# Patient Record
Sex: Male | Born: 1935 | Race: White | Hispanic: No | Smoking: Never smoker
Health system: Southern US, Community
[De-identification: ages and names within clinical notes are randomized; demographics above are authoritative.]

## PROBLEM LIST (undated history)

## (undated) DIAGNOSIS — I4821 Permanent atrial fibrillation: Secondary | ICD-10-CM

## (undated) DIAGNOSIS — I2510 Atherosclerotic heart disease of native coronary artery without angina pectoris: Secondary | ICD-10-CM

## (undated) DIAGNOSIS — I4510 Unspecified right bundle-branch block: Secondary | ICD-10-CM

## (undated) DIAGNOSIS — M4800 Spinal stenosis, site unspecified: Secondary | ICD-10-CM

## (undated) DIAGNOSIS — E785 Hyperlipidemia, unspecified: Secondary | ICD-10-CM

## (undated) DIAGNOSIS — Z5189 Encounter for other specified aftercare: Secondary | ICD-10-CM

## (undated) DIAGNOSIS — G629 Polyneuropathy, unspecified: Secondary | ICD-10-CM

## (undated) DIAGNOSIS — I10 Essential (primary) hypertension: Secondary | ICD-10-CM

## (undated) DIAGNOSIS — H409 Unspecified glaucoma: Secondary | ICD-10-CM

## (undated) DIAGNOSIS — K5790 Diverticulosis of intestine, part unspecified, without perforation or abscess without bleeding: Secondary | ICD-10-CM

## (undated) DIAGNOSIS — M1990 Unspecified osteoarthritis, unspecified site: Secondary | ICD-10-CM

## (undated) DIAGNOSIS — M7050 Other bursitis of knee, unspecified knee: Secondary | ICD-10-CM

## (undated) DIAGNOSIS — D126 Benign neoplasm of colon, unspecified: Secondary | ICD-10-CM

## (undated) DIAGNOSIS — Q249 Congenital malformation of heart, unspecified: Secondary | ICD-10-CM

## (undated) HISTORY — PX: EYE SURGERY: SHX253

## (undated) HISTORY — DX: Benign neoplasm of colon, unspecified: D12.6

## (undated) HISTORY — DX: Congenital malformation of heart, unspecified: Q24.9

## (undated) HISTORY — DX: Hyperlipidemia, unspecified: E78.5

## (undated) HISTORY — PX: ROTATOR CUFF REPAIR: SHX139

## (undated) HISTORY — DX: Polyneuropathy, unspecified: G62.9

## (undated) HISTORY — PX: SPINE SURGERY: SHX786

## (undated) HISTORY — DX: Encounter for other specified aftercare: Z51.89

## (undated) HISTORY — DX: Other bursitis of knee, unspecified knee: M70.50

## (undated) HISTORY — DX: Essential (primary) hypertension: I10

## (undated) HISTORY — PX: VASECTOMY: SHX75

## (undated) HISTORY — PX: CORONARY ANGIOPLASTY WITH STENT PLACEMENT: SHX49

## (undated) HISTORY — PX: SMALL INTESTINE SURGERY: SHX150

## (undated) HISTORY — PX: CORNEAL TRANSPLANT: SHX108

## (undated) HISTORY — DX: Unspecified glaucoma: H40.9

## (undated) HISTORY — PX: BACK SURGERY: SHX140

## (undated) HISTORY — DX: Atherosclerotic heart disease of native coronary artery without angina pectoris: I25.10

## (undated) HISTORY — DX: Permanent atrial fibrillation: I48.21

## (undated) HISTORY — DX: Unspecified right bundle-branch block: I45.10

## (undated) HISTORY — DX: Diverticulosis of intestine, part unspecified, without perforation or abscess without bleeding: K57.90

## (undated) HISTORY — DX: Spinal stenosis, site unspecified: M48.00

## (undated) HISTORY — DX: Unspecified osteoarthritis, unspecified site: M19.90

---

## 1998-04-28 ENCOUNTER — Ambulatory Visit (HOSPITAL_COMMUNITY): Admission: RE | Admit: 1998-04-28 | Discharge: 1998-04-28 | Payer: Self-pay | Admitting: Gastroenterology

## 1999-05-22 ENCOUNTER — Ambulatory Visit (HOSPITAL_COMMUNITY): Admission: RE | Admit: 1999-05-22 | Discharge: 1999-05-22 | Payer: Self-pay | Admitting: Cardiology

## 1999-05-22 ENCOUNTER — Encounter: Payer: Self-pay | Admitting: Cardiology

## 1999-07-09 ENCOUNTER — Encounter: Payer: Self-pay | Admitting: Cardiology

## 1999-07-09 ENCOUNTER — Ambulatory Visit (HOSPITAL_COMMUNITY): Admission: RE | Admit: 1999-07-09 | Discharge: 1999-07-09 | Payer: Self-pay | Admitting: Cardiology

## 1999-11-06 ENCOUNTER — Encounter (INDEPENDENT_AMBULATORY_CARE_PROVIDER_SITE_OTHER): Payer: Self-pay | Admitting: Specialist

## 1999-11-06 ENCOUNTER — Ambulatory Visit (HOSPITAL_COMMUNITY): Admission: RE | Admit: 1999-11-06 | Discharge: 1999-11-06 | Payer: Self-pay | Admitting: Gastroenterology

## 2005-01-20 ENCOUNTER — Encounter: Admission: RE | Admit: 2005-01-20 | Discharge: 2005-01-20 | Payer: Self-pay | Admitting: Family Medicine

## 2005-01-20 DIAGNOSIS — M4800 Spinal stenosis, site unspecified: Secondary | ICD-10-CM

## 2005-01-20 HISTORY — DX: Spinal stenosis, site unspecified: M48.00

## 2005-01-22 ENCOUNTER — Emergency Department (HOSPITAL_COMMUNITY): Admission: EM | Admit: 2005-01-22 | Discharge: 2005-01-22 | Payer: Self-pay | Admitting: Emergency Medicine

## 2005-02-05 ENCOUNTER — Ambulatory Visit (HOSPITAL_COMMUNITY): Admission: RE | Admit: 2005-02-05 | Discharge: 2005-02-05 | Payer: Self-pay | Admitting: Neurosurgery

## 2005-03-01 ENCOUNTER — Ambulatory Visit (HOSPITAL_COMMUNITY): Admission: RE | Admit: 2005-03-01 | Discharge: 2005-03-02 | Payer: Self-pay | Admitting: Cardiology

## 2005-04-15 ENCOUNTER — Ambulatory Visit (HOSPITAL_COMMUNITY): Admission: RE | Admit: 2005-04-15 | Discharge: 2005-04-16 | Payer: Self-pay | Admitting: Neurosurgery

## 2005-04-25 ENCOUNTER — Inpatient Hospital Stay (HOSPITAL_COMMUNITY): Admission: EM | Admit: 2005-04-25 | Discharge: 2005-05-03 | Payer: Self-pay | Admitting: Emergency Medicine

## 2005-05-13 ENCOUNTER — Ambulatory Visit (HOSPITAL_COMMUNITY): Admission: RE | Admit: 2005-05-13 | Discharge: 2005-05-13 | Payer: Self-pay | Admitting: Neurosurgery

## 2005-06-02 ENCOUNTER — Encounter: Admission: RE | Admit: 2005-06-02 | Discharge: 2005-06-02 | Payer: Self-pay | Admitting: Family Medicine

## 2005-12-09 DIAGNOSIS — M1990 Unspecified osteoarthritis, unspecified site: Secondary | ICD-10-CM

## 2005-12-09 HISTORY — DX: Unspecified osteoarthritis, unspecified site: M19.90

## 2006-01-03 ENCOUNTER — Encounter: Admission: RE | Admit: 2006-01-03 | Discharge: 2006-01-03 | Payer: Self-pay | Admitting: Family Medicine

## 2006-02-25 ENCOUNTER — Emergency Department (HOSPITAL_COMMUNITY): Admission: EM | Admit: 2006-02-25 | Discharge: 2006-02-25 | Payer: Self-pay | Admitting: Emergency Medicine

## 2006-09-19 ENCOUNTER — Ambulatory Visit: Payer: Self-pay | Admitting: Family Medicine

## 2006-09-19 LAB — CONVERTED CEMR LAB
Creatinine,U: 100 mg/dL
Hgb A1c MFr Bld: 6 % (ref 4.6–6.0)
Microalb, Ur: 0.2 mg/dL (ref 0.0–1.9)

## 2006-10-28 ENCOUNTER — Ambulatory Visit: Payer: Self-pay | Admitting: Internal Medicine

## 2006-12-19 ENCOUNTER — Ambulatory Visit: Payer: Self-pay | Admitting: Family Medicine

## 2006-12-19 LAB — CONVERTED CEMR LAB
Creatinine,U: 78.4 mg/dL
Glucose, Bld: 131 mg/dL — ABNORMAL HIGH (ref 70–99)
Hgb A1c MFr Bld: 6.1 % — ABNORMAL HIGH (ref 4.6–6.0)
Microalb Creat Ratio: 3.8 mg/g (ref 0.0–30.0)
Microalb, Ur: 0.3 mg/dL (ref 0.0–1.9)

## 2007-03-27 DIAGNOSIS — I10 Essential (primary) hypertension: Secondary | ICD-10-CM | POA: Insufficient documentation

## 2007-03-27 DIAGNOSIS — I2510 Atherosclerotic heart disease of native coronary artery without angina pectoris: Secondary | ICD-10-CM | POA: Insufficient documentation

## 2007-03-27 HISTORY — DX: Atherosclerotic heart disease of native coronary artery without angina pectoris: I25.10

## 2007-04-23 ENCOUNTER — Telehealth (INDEPENDENT_AMBULATORY_CARE_PROVIDER_SITE_OTHER): Payer: Self-pay | Admitting: Family Medicine

## 2007-05-12 ENCOUNTER — Ambulatory Visit: Payer: Self-pay | Admitting: Family Medicine

## 2007-05-12 DIAGNOSIS — E119 Type 2 diabetes mellitus without complications: Secondary | ICD-10-CM | POA: Insufficient documentation

## 2007-05-12 DIAGNOSIS — D229 Melanocytic nevi, unspecified: Secondary | ICD-10-CM | POA: Insufficient documentation

## 2007-05-19 ENCOUNTER — Ambulatory Visit: Payer: Self-pay | Admitting: Family Medicine

## 2007-05-26 ENCOUNTER — Encounter (INDEPENDENT_AMBULATORY_CARE_PROVIDER_SITE_OTHER): Payer: Self-pay | Admitting: *Deleted

## 2007-05-26 LAB — CONVERTED CEMR LAB
Glucose, Bld: 143 mg/dL — ABNORMAL HIGH (ref 70–99)
Hgb A1c MFr Bld: 5.9 % (ref 4.6–6.0)

## 2007-06-06 ENCOUNTER — Ambulatory Visit: Payer: Self-pay | Admitting: Family Medicine

## 2007-06-14 ENCOUNTER — Telehealth (INDEPENDENT_AMBULATORY_CARE_PROVIDER_SITE_OTHER): Payer: Self-pay | Admitting: *Deleted

## 2007-06-21 ENCOUNTER — Encounter (INDEPENDENT_AMBULATORY_CARE_PROVIDER_SITE_OTHER): Payer: Self-pay | Admitting: *Deleted

## 2007-08-04 ENCOUNTER — Encounter (INDEPENDENT_AMBULATORY_CARE_PROVIDER_SITE_OTHER): Payer: Self-pay | Admitting: Family Medicine

## 2007-08-14 ENCOUNTER — Ambulatory Visit: Payer: Self-pay | Admitting: Family Medicine

## 2007-08-31 ENCOUNTER — Ambulatory Visit: Payer: Self-pay | Admitting: Family Medicine

## 2007-09-06 ENCOUNTER — Telehealth (INDEPENDENT_AMBULATORY_CARE_PROVIDER_SITE_OTHER): Payer: Self-pay | Admitting: *Deleted

## 2007-09-06 ENCOUNTER — Encounter (INDEPENDENT_AMBULATORY_CARE_PROVIDER_SITE_OTHER): Payer: Self-pay | Admitting: *Deleted

## 2007-09-11 ENCOUNTER — Telehealth (INDEPENDENT_AMBULATORY_CARE_PROVIDER_SITE_OTHER): Payer: Self-pay | Admitting: Family Medicine

## 2007-09-14 ENCOUNTER — Encounter (INDEPENDENT_AMBULATORY_CARE_PROVIDER_SITE_OTHER): Payer: Self-pay | Admitting: *Deleted

## 2007-09-14 ENCOUNTER — Telehealth (INDEPENDENT_AMBULATORY_CARE_PROVIDER_SITE_OTHER): Payer: Self-pay | Admitting: *Deleted

## 2007-09-14 LAB — CONVERTED CEMR LAB
ALT: 35 units/L (ref 0–53)
AST: 28 units/L (ref 0–37)
Cholesterol: 104 mg/dL (ref 0–200)
Creatinine,U: 95.7 mg/dL
HDL: 27.5 mg/dL — ABNORMAL LOW (ref >39.0)
Hgb A1c MFr Bld: 6.3 % — ABNORMAL HIGH (ref 4.6–6.0)
LDL Cholesterol: 54 mg/dL (ref 0–99)
Microalb Creat Ratio: 8.4 mg/g (ref 0.0–30.0)
Microalb, Ur: 0.8 mg/dL (ref 0.0–1.9)
Total CHOL/HDL Ratio: 3.8
Triglycerides: 114 mg/dL (ref 0–149)
VLDL: 23 mg/dL (ref 0–40)

## 2007-11-29 ENCOUNTER — Ambulatory Visit: Payer: Self-pay | Admitting: Family Medicine

## 2007-11-29 ENCOUNTER — Encounter (INDEPENDENT_AMBULATORY_CARE_PROVIDER_SITE_OTHER): Payer: Self-pay | Admitting: *Deleted

## 2007-11-29 LAB — CONVERTED CEMR LAB
Glucose, Bld: 120 mg/dL — ABNORMAL HIGH (ref 70–99)
Hgb A1c MFr Bld: 6.6 % — ABNORMAL HIGH (ref 4.6–6.0)

## 2008-01-16 ENCOUNTER — Encounter: Payer: Self-pay | Admitting: Internal Medicine

## 2008-04-15 ENCOUNTER — Telehealth (INDEPENDENT_AMBULATORY_CARE_PROVIDER_SITE_OTHER): Payer: Self-pay | Admitting: *Deleted

## 2008-04-26 ENCOUNTER — Ambulatory Visit: Payer: Self-pay | Admitting: Family Medicine

## 2008-05-08 ENCOUNTER — Encounter (INDEPENDENT_AMBULATORY_CARE_PROVIDER_SITE_OTHER): Payer: Self-pay | Admitting: *Deleted

## 2008-05-08 ENCOUNTER — Telehealth (INDEPENDENT_AMBULATORY_CARE_PROVIDER_SITE_OTHER): Payer: Self-pay | Admitting: *Deleted

## 2008-05-08 LAB — CONVERTED CEMR LAB
ALT: 39 units/L (ref 0–53)
AST: 46 units/L — ABNORMAL HIGH (ref 0–37)
Albumin: 3.5 g/dL (ref 3.5–5.2)
Alkaline Phosphatase: 54 units/L (ref 39–117)
BUN: 18 mg/dL (ref 6–23)
Bilirubin, Direct: 0.2 mg/dL (ref 0.0–0.3)
CO2: 26 meq/L (ref 19–32)
CRP, High Sensitivity: <1 — ABNORMAL LOW (ref 0.00–5.00)
Calcium: 8.8 mg/dL (ref 8.4–10.5)
Chloride: 106 meq/L (ref 96–112)
Cholesterol: 123 mg/dL (ref 0–200)
Creatinine, Ser: 1 mg/dL (ref 0.4–1.5)
Creatinine,U: 115.1 mg/dL
GFR calc Af Amer: 94 mL/min
GFR calc non Af Amer: 78 mL/min
Glucose, Bld: 155 mg/dL — ABNORMAL HIGH (ref 70–99)
HDL: 30.7 mg/dL — ABNORMAL LOW (ref >39.0)
Hgb A1c MFr Bld: 7 % — ABNORMAL HIGH (ref 4.6–6.0)
LDL Cholesterol: 66 mg/dL (ref 0–99)
Microalb Creat Ratio: 4.3 mg/g (ref 0.0–30.0)
Microalb, Ur: 0.5 mg/dL (ref 0.0–1.9)
Potassium: 4.5 meq/L (ref 3.5–5.1)
Sodium: 138 meq/L (ref 135–145)
Total Bilirubin: 1.1 mg/dL (ref 0.3–1.2)
Total CHOL/HDL Ratio: 4
Total Protein: 6.8 g/dL (ref 6.0–8.3)
Triglycerides: 130 mg/dL (ref 0–149)
VLDL: 26 mg/dL (ref 0–40)

## 2008-08-16 ENCOUNTER — Ambulatory Visit: Payer: Self-pay | Admitting: Family Medicine

## 2008-08-16 DIAGNOSIS — R252 Cramp and spasm: Secondary | ICD-10-CM | POA: Insufficient documentation

## 2008-08-19 LAB — CONVERTED CEMR LAB
ALT: 49 units/L (ref 0–53)
AST: 52 units/L — ABNORMAL HIGH (ref 0–37)
Albumin: 3.7 g/dL (ref 3.5–5.2)
Alkaline Phosphatase: 50 units/L (ref 39–117)
BUN: 15 mg/dL (ref 6–23)
Bilirubin, Direct: 0.1 mg/dL (ref 0.0–0.3)
CO2: 29 meq/L (ref 19–32)
Calcium: 8.8 mg/dL (ref 8.4–10.5)
Chloride: 105 meq/L (ref 96–112)
Creatinine, Ser: 0.9 mg/dL (ref 0.4–1.5)
GFR calc Af Amer: 107 mL/min
GFR calc non Af Amer: 88 mL/min
Glucose, Bld: 114 mg/dL — ABNORMAL HIGH (ref 70–99)
Hgb A1c MFr Bld: 6.8 % — ABNORMAL HIGH (ref 4.6–6.0)
Potassium: 4.9 meq/L (ref 3.5–5.1)
Sodium: 140 meq/L (ref 135–145)
Total Bilirubin: 1 mg/dL (ref 0.3–1.2)
Total Protein: 7 g/dL (ref 6.0–8.3)

## 2008-08-21 ENCOUNTER — Encounter (INDEPENDENT_AMBULATORY_CARE_PROVIDER_SITE_OTHER): Payer: Self-pay | Admitting: *Deleted

## 2008-08-29 ENCOUNTER — Encounter: Payer: Self-pay | Admitting: Family Medicine

## 2008-09-04 ENCOUNTER — Encounter (INDEPENDENT_AMBULATORY_CARE_PROVIDER_SITE_OTHER): Payer: Self-pay | Admitting: *Deleted

## 2008-11-04 ENCOUNTER — Ambulatory Visit: Payer: Self-pay | Admitting: Family Medicine

## 2008-11-04 ENCOUNTER — Telehealth (INDEPENDENT_AMBULATORY_CARE_PROVIDER_SITE_OTHER): Payer: Self-pay | Admitting: *Deleted

## 2008-11-04 DIAGNOSIS — R7401 Elevation of levels of liver transaminase levels: Secondary | ICD-10-CM | POA: Insufficient documentation

## 2008-11-04 DIAGNOSIS — R7402 Elevation of levels of lactic acid dehydrogenase (LDH): Secondary | ICD-10-CM

## 2008-11-04 DIAGNOSIS — I499 Cardiac arrhythmia, unspecified: Secondary | ICD-10-CM | POA: Insufficient documentation

## 2008-11-04 DIAGNOSIS — R740 Nonspecific elevation of levels of transaminase and lactic acid dehydrogenase [LDH]: Secondary | ICD-10-CM

## 2008-11-04 HISTORY — DX: Elevation of levels of liver transaminase levels: R74.01

## 2008-11-04 HISTORY — DX: Elevation of levels of lactic acid dehydrogenase (LDH): R74.02

## 2008-11-07 ENCOUNTER — Encounter (INDEPENDENT_AMBULATORY_CARE_PROVIDER_SITE_OTHER): Payer: Self-pay | Admitting: *Deleted

## 2008-11-15 ENCOUNTER — Encounter: Payer: Self-pay | Admitting: Family Medicine

## 2008-12-11 ENCOUNTER — Encounter: Payer: Self-pay | Admitting: Family Medicine

## 2008-12-23 ENCOUNTER — Encounter: Payer: Self-pay | Admitting: Family Medicine

## 2008-12-23 LAB — HM COLONOSCOPY: HM Colonoscopy: NORMAL

## 2009-01-15 ENCOUNTER — Encounter (INDEPENDENT_AMBULATORY_CARE_PROVIDER_SITE_OTHER): Payer: Self-pay | Admitting: *Deleted

## 2009-02-03 ENCOUNTER — Ambulatory Visit: Payer: Self-pay | Admitting: Family Medicine

## 2009-02-04 ENCOUNTER — Encounter (INDEPENDENT_AMBULATORY_CARE_PROVIDER_SITE_OTHER): Payer: Self-pay | Admitting: *Deleted

## 2009-02-04 LAB — CONVERTED CEMR LAB
BUN: 14 mg/dL (ref 6–23)
CO2: 29 meq/L (ref 19–32)
Calcium: 8.8 mg/dL (ref 8.4–10.5)
Chloride: 110 meq/L (ref 96–112)
Creatinine, Ser: 0.9 mg/dL (ref 0.4–1.5)
GFR calc non Af Amer: 87.87 mL/min (ref >60)
Glucose, Bld: 123 mg/dL — ABNORMAL HIGH (ref 70–99)
Hgb A1c MFr Bld: 6.5 % (ref 4.6–6.5)
Potassium: 4.3 meq/L (ref 3.5–5.1)
Sodium: 143 meq/L (ref 135–145)

## 2009-03-05 ENCOUNTER — Ambulatory Visit: Payer: Self-pay | Admitting: Family Medicine

## 2009-03-07 ENCOUNTER — Encounter (INDEPENDENT_AMBULATORY_CARE_PROVIDER_SITE_OTHER): Payer: Self-pay | Admitting: *Deleted

## 2009-03-07 LAB — CONVERTED CEMR LAB
BUN: 18 mg/dL (ref 6–23)
CO2: 29 meq/L (ref 19–32)
Calcium: 8.9 mg/dL (ref 8.4–10.5)
Chloride: 111 meq/L (ref 96–112)
Creatinine, Ser: 1 mg/dL (ref 0.4–1.5)
GFR calc non Af Amer: 77.8 mL/min (ref >60)
Glucose, Bld: 125 mg/dL — ABNORMAL HIGH (ref 70–99)
Potassium: 4.6 meq/L (ref 3.5–5.1)
Sodium: 142 meq/L (ref 135–145)

## 2009-04-24 ENCOUNTER — Telehealth (INDEPENDENT_AMBULATORY_CARE_PROVIDER_SITE_OTHER): Payer: Self-pay | Admitting: *Deleted

## 2009-05-07 ENCOUNTER — Ambulatory Visit: Payer: Self-pay | Admitting: Family Medicine

## 2009-05-08 ENCOUNTER — Telehealth (INDEPENDENT_AMBULATORY_CARE_PROVIDER_SITE_OTHER): Payer: Self-pay | Admitting: *Deleted

## 2009-05-08 ENCOUNTER — Encounter (INDEPENDENT_AMBULATORY_CARE_PROVIDER_SITE_OTHER): Payer: Self-pay | Admitting: *Deleted

## 2009-05-08 LAB — CONVERTED CEMR LAB
ALT: 29 units/L (ref 0–53)
AST: 30 units/L (ref 0–37)
Albumin: 4 g/dL (ref 3.5–5.2)
Alkaline Phosphatase: 60 units/L (ref 39–117)
BUN: 21 mg/dL (ref 6–23)
Bilirubin, Direct: 0.2 mg/dL (ref 0.0–0.3)
CO2: 29 meq/L (ref 19–32)
Calcium: 9.1 mg/dL (ref 8.4–10.5)
Chloride: 107 meq/L (ref 96–112)
Cholesterol: 106 mg/dL (ref 0–200)
Creatinine, Ser: 1.1 mg/dL (ref 0.4–1.5)
Creatinine,U: 93.1 mg/dL
GFR calc non Af Amer: 69.66 mL/min (ref >60)
Glucose, Bld: 123 mg/dL — ABNORMAL HIGH (ref 70–99)
HDL: 31.6 mg/dL — ABNORMAL LOW (ref >39.00)
Hgb A1c MFr Bld: 6.3 % (ref 4.6–6.5)
LDL Cholesterol: 50 mg/dL (ref 0–99)
Potassium: 5.2 meq/L — ABNORMAL HIGH (ref 3.5–5.1)
Sodium: 143 meq/L (ref 135–145)
Total Bilirubin: 1.2 mg/dL (ref 0.3–1.2)
Total CHOL/HDL Ratio: 3
Total Protein: 7.6 g/dL (ref 6.0–8.3)
Triglycerides: 123 mg/dL (ref 0.0–149.0)
VLDL: 24.6 mg/dL (ref 0.0–40.0)

## 2009-05-12 ENCOUNTER — Ambulatory Visit: Payer: Self-pay | Admitting: Family Medicine

## 2009-05-13 ENCOUNTER — Encounter (INDEPENDENT_AMBULATORY_CARE_PROVIDER_SITE_OTHER): Payer: Self-pay | Admitting: *Deleted

## 2009-05-13 LAB — CONVERTED CEMR LAB: Potassium: 5.1 meq/L (ref 3.5–5.1)

## 2009-05-29 ENCOUNTER — Ambulatory Visit: Payer: Self-pay | Admitting: Family Medicine

## 2009-06-02 ENCOUNTER — Telehealth (INDEPENDENT_AMBULATORY_CARE_PROVIDER_SITE_OTHER): Payer: Self-pay | Admitting: *Deleted

## 2009-08-11 HISTORY — PX: CATARACT EXTRACTION: SUR2

## 2009-10-02 ENCOUNTER — Telehealth (INDEPENDENT_AMBULATORY_CARE_PROVIDER_SITE_OTHER): Payer: Self-pay | Admitting: *Deleted

## 2009-10-14 ENCOUNTER — Telehealth (INDEPENDENT_AMBULATORY_CARE_PROVIDER_SITE_OTHER): Payer: Self-pay | Admitting: *Deleted

## 2009-11-03 ENCOUNTER — Telehealth (INDEPENDENT_AMBULATORY_CARE_PROVIDER_SITE_OTHER): Payer: Self-pay | Admitting: *Deleted

## 2009-11-17 ENCOUNTER — Encounter (INDEPENDENT_AMBULATORY_CARE_PROVIDER_SITE_OTHER): Payer: Self-pay | Admitting: *Deleted

## 2009-12-22 ENCOUNTER — Ambulatory Visit: Payer: Self-pay | Admitting: Family Medicine

## 2009-12-22 DIAGNOSIS — H9190 Unspecified hearing loss, unspecified ear: Secondary | ICD-10-CM | POA: Insufficient documentation

## 2009-12-26 ENCOUNTER — Ambulatory Visit: Payer: Self-pay | Admitting: Family Medicine

## 2009-12-26 LAB — CONVERTED CEMR LAB
Bilirubin Urine: NEGATIVE
Blood in Urine, dipstick: NEGATIVE
Glucose, Urine, Semiquant: NEGATIVE
Ketones, urine, test strip: NEGATIVE
Nitrite: NEGATIVE
Protein, U semiquant: NEGATIVE
Specific Gravity, Urine: 1.02
Urobilinogen, UA: 0.2
WBC Urine, dipstick: NEGATIVE
pH: 6

## 2009-12-31 ENCOUNTER — Encounter: Payer: Self-pay | Admitting: Family Medicine

## 2010-01-06 ENCOUNTER — Telehealth (INDEPENDENT_AMBULATORY_CARE_PROVIDER_SITE_OTHER): Payer: Self-pay | Admitting: *Deleted

## 2010-01-06 ENCOUNTER — Encounter (INDEPENDENT_AMBULATORY_CARE_PROVIDER_SITE_OTHER): Payer: Self-pay | Admitting: *Deleted

## 2010-01-06 LAB — CONVERTED CEMR LAB
ALT: 35 units/L (ref 0–53)
AST: 35 units/L (ref 0–37)
Albumin: 3.9 g/dL (ref 3.5–5.2)
Alkaline Phosphatase: 49 units/L (ref 39–117)
BUN: 21 mg/dL (ref 6–23)
Basophils Absolute: 0.1 10*3/uL (ref 0.0–0.1)
Basophils Relative: 1.2 % (ref 0.0–3.0)
Bilirubin, Direct: 0.2 mg/dL (ref 0.0–0.3)
CO2: 27 meq/L (ref 19–32)
Calcium: 9.4 mg/dL (ref 8.4–10.5)
Chloride: 104 meq/L (ref 96–112)
Cholesterol: 112 mg/dL (ref 0–200)
Creatinine, Ser: 1.1 mg/dL (ref 0.4–1.5)
Eosinophils Absolute: 0.2 10*3/uL (ref 0.0–0.7)
Eosinophils Relative: 4.1 % (ref 0.0–5.0)
GFR calc non Af Amer: 69.54 mL/min (ref >60)
Glucose, Bld: 124 mg/dL — ABNORMAL HIGH (ref 70–99)
HCT: 37.3 % — ABNORMAL LOW (ref 39.0–52.0)
HDL: 33.5 mg/dL — ABNORMAL LOW (ref >39.00)
Hemoglobin: 12.4 g/dL — ABNORMAL LOW (ref 13.0–17.0)
Hgb A1c MFr Bld: 6.3 % (ref 4.6–6.5)
LDL Cholesterol: 52 mg/dL (ref 0–99)
Lymphocytes Relative: 38.4 % (ref 12.0–46.0)
Lymphs Abs: 2.1 10*3/uL (ref 0.7–4.0)
MCHC: 33.1 g/dL (ref 30.0–36.0)
MCV: 94.2 fL (ref 78.0–100.0)
Monocytes Absolute: 0.5 10*3/uL (ref 0.1–1.0)
Monocytes Relative: 9.6 % (ref 3.0–12.0)
Neutro Abs: 2.6 10*3/uL (ref 1.4–7.7)
Neutrophils Relative %: 46.7 % (ref 43.0–77.0)
PSA: 1.2 ng/mL (ref 0.10–4.00)
Platelets: 154 10*3/uL (ref 150.0–400.0)
Potassium: 5 meq/L (ref 3.5–5.1)
RBC: 3.96 M/uL — ABNORMAL LOW (ref 4.22–5.81)
RDW: 13.6 % (ref 11.5–14.6)
Sodium: 141 meq/L (ref 135–145)
TSH: 3.79 microintl units/mL (ref 0.35–5.50)
Total Bilirubin: 0.7 mg/dL (ref 0.3–1.2)
Total CHOL/HDL Ratio: 3
Total Protein: 7.4 g/dL (ref 6.0–8.3)
Triglycerides: 131 mg/dL (ref 0.0–149.0)
VLDL: 26.2 mg/dL (ref 0.0–40.0)
WBC: 5.5 10*3/uL (ref 4.5–10.5)

## 2010-01-13 ENCOUNTER — Ambulatory Visit: Payer: Self-pay | Admitting: Family Medicine

## 2010-01-13 LAB — CONVERTED CEMR LAB: Fecal Occult Bld: NEGATIVE

## 2010-02-18 ENCOUNTER — Ambulatory Visit: Payer: Self-pay | Admitting: Family Medicine

## 2010-02-18 DIAGNOSIS — L02419 Cutaneous abscess of limb, unspecified: Secondary | ICD-10-CM | POA: Insufficient documentation

## 2010-02-18 DIAGNOSIS — L03119 Cellulitis of unspecified part of limb: Secondary | ICD-10-CM | POA: Insufficient documentation

## 2010-02-18 DIAGNOSIS — D649 Anemia, unspecified: Secondary | ICD-10-CM

## 2010-02-18 HISTORY — DX: Anemia, unspecified: D64.9

## 2010-02-20 ENCOUNTER — Telehealth (INDEPENDENT_AMBULATORY_CARE_PROVIDER_SITE_OTHER): Payer: Self-pay | Admitting: *Deleted

## 2010-02-20 LAB — CONVERTED CEMR LAB
Basophils Absolute: 0.2 10*3/uL — ABNORMAL HIGH (ref 0.0–0.1)
Basophils Relative: 2.5 % (ref 0.0–3.0)
Eosinophils Absolute: 0.3 10*3/uL (ref 0.0–0.7)
Eosinophils Relative: 4.6 % (ref 0.0–5.0)
HCT: 34.9 % — ABNORMAL LOW (ref 39.0–52.0)
Hemoglobin: 11.9 g/dL — ABNORMAL LOW (ref 13.0–17.0)
Lymphocytes Relative: 59.5 % — ABNORMAL HIGH (ref 12.0–46.0)
Lymphs Abs: 3.6 10*3/uL (ref 0.7–4.0)
MCHC: 34.1 g/dL (ref 30.0–36.0)
MCV: 94.1 fL (ref 78.0–100.0)
Monocytes Absolute: 0.8 10*3/uL (ref 0.1–1.0)
Monocytes Relative: 12.4 % — ABNORMAL HIGH (ref 3.0–12.0)
Neutro Abs: 1.3 10*3/uL — ABNORMAL LOW (ref 1.4–7.7)
Neutrophils Relative %: 21 % — ABNORMAL LOW (ref 43.0–77.0)
Platelets: 155 10*3/uL (ref 150.0–400.0)
RBC: 3.7 M/uL — ABNORMAL LOW (ref 4.22–5.81)
RDW: 14.8 % — ABNORMAL HIGH (ref 11.5–14.6)
WBC: 6.1 10*3/uL (ref 4.5–10.5)

## 2010-02-27 ENCOUNTER — Ambulatory Visit: Payer: Self-pay | Admitting: Family Medicine

## 2010-03-02 LAB — CONVERTED CEMR LAB: Hgb A1c MFr Bld: 6.3 % — ABNORMAL HIGH (ref <5.7)

## 2010-05-29 ENCOUNTER — Ambulatory Visit: Payer: Self-pay | Admitting: Family Medicine

## 2010-05-29 DIAGNOSIS — M25529 Pain in unspecified elbow: Secondary | ICD-10-CM | POA: Insufficient documentation

## 2010-07-08 ENCOUNTER — Ambulatory Visit: Payer: Self-pay | Admitting: Family Medicine

## 2010-07-09 LAB — CONVERTED CEMR LAB
ALT: 44 units/L (ref 0–53)
AST: 56 units/L — ABNORMAL HIGH (ref 0–37)
Albumin: 3.8 g/dL (ref 3.5–5.2)
Alkaline Phosphatase: 48 units/L (ref 39–117)
BUN: 19 mg/dL (ref 6–23)
Basophils Absolute: 0 10*3/uL (ref 0.0–0.1)
Basophils Relative: 0.6 % (ref 0.0–3.0)
Bilirubin, Direct: 0.2 mg/dL (ref 0.0–0.3)
CO2: 26 meq/L (ref 19–32)
Calcium: 8.8 mg/dL (ref 8.4–10.5)
Chloride: 107 meq/L (ref 96–112)
Cholesterol: 124 mg/dL (ref 0–200)
Creatinine, Ser: 1.1 mg/dL (ref 0.4–1.5)
Creatinine,U: 136 mg/dL
Eosinophils Absolute: 0.2 10*3/uL (ref 0.0–0.7)
Eosinophils Relative: 3.7 % (ref 0.0–5.0)
Ferritin: 29.2 ng/mL (ref 22.0–322.0)
GFR calc non Af Amer: 69.44 mL/min (ref >60)
Glucose, Bld: 138 mg/dL — ABNORMAL HIGH (ref 70–99)
HCT: 35.1 % — ABNORMAL LOW (ref 39.0–52.0)
HDL: 30 mg/dL — ABNORMAL LOW (ref >39.00)
Hemoglobin: 11.9 g/dL — ABNORMAL LOW (ref 13.0–17.0)
Hgb A1c MFr Bld: 6.8 % — ABNORMAL HIGH (ref 4.6–6.5)
Iron: 159 ug/dL (ref 42–165)
LDL Cholesterol: 62 mg/dL (ref 0–99)
Lymphocytes Relative: 31.1 % (ref 12.0–46.0)
Lymphs Abs: 1.9 10*3/uL (ref 0.7–4.0)
MCHC: 34 g/dL (ref 30.0–36.0)
MCV: 95.1 fL (ref 78.0–100.0)
Microalb Creat Ratio: 0.3 mg/g (ref 0.0–30.0)
Microalb, Ur: 0.4 mg/dL (ref 0.0–1.9)
Monocytes Absolute: 0.7 10*3/uL (ref 0.1–1.0)
Monocytes Relative: 10.8 % (ref 3.0–12.0)
Neutro Abs: 3.3 10*3/uL (ref 1.4–7.7)
Neutrophils Relative %: 53.8 % (ref 43.0–77.0)
Platelets: 145 10*3/uL — ABNORMAL LOW (ref 150.0–400.0)
Potassium: 5.2 meq/L — ABNORMAL HIGH (ref 3.5–5.1)
RBC: 3.69 M/uL — ABNORMAL LOW (ref 4.22–5.81)
RDW: 14.8 % — ABNORMAL HIGH (ref 11.5–14.6)
Saturation Ratios: 34 % (ref 20.0–50.0)
Sodium: 143 meq/L (ref 135–145)
Total Bilirubin: 0.9 mg/dL (ref 0.3–1.2)
Total CHOL/HDL Ratio: 4
Total Protein: 6.8 g/dL (ref 6.0–8.3)
Transferrin: 333.6 mg/dL (ref 212.0–360.0)
Triglycerides: 161 mg/dL — ABNORMAL HIGH (ref 0.0–149.0)
VLDL: 32.2 mg/dL (ref 0.0–40.0)
Vitamin B-12: 229 pg/mL (ref 211–911)
WBC: 6.2 10*3/uL (ref 4.5–10.5)

## 2010-07-10 ENCOUNTER — Ambulatory Visit: Payer: Self-pay | Admitting: Family Medicine

## 2010-07-22 ENCOUNTER — Ambulatory Visit: Payer: Self-pay | Admitting: Family Medicine

## 2010-08-14 LAB — CONVERTED CEMR LAB
ALT: 34 units/L (ref 0–53)
AST: 41 units/L — ABNORMAL HIGH (ref 0–37)
Albumin: 3.6 g/dL (ref 3.5–5.2)
Alkaline Phosphatase: 46 units/L (ref 39–117)
Bilirubin, Direct: 0.2 mg/dL (ref 0.0–0.3)
Total Bilirubin: 0.7 mg/dL (ref 0.3–1.2)
Total Protein: 6.7 g/dL (ref 6.0–8.3)

## 2010-08-20 ENCOUNTER — Ambulatory Visit: Payer: Self-pay | Admitting: Family Medicine

## 2010-10-01 ENCOUNTER — Telehealth: Payer: Self-pay | Admitting: Family Medicine

## 2010-10-06 ENCOUNTER — Ambulatory Visit: Payer: Self-pay | Admitting: Family Medicine

## 2010-10-07 LAB — CONVERTED CEMR LAB
BUN: 15 mg/dL (ref 6–23)
CO2: 29 meq/L (ref 19–32)
Calcium: 8.8 mg/dL (ref 8.4–10.5)
Chloride: 103 meq/L (ref 96–112)
Creatinine, Ser: 1.1 mg/dL (ref 0.4–1.5)
GFR calc non Af Amer: 72.42 mL/min (ref >60.00)
Glucose, Bld: 132 mg/dL — ABNORMAL HIGH (ref 70–99)
Hemoglobin: 12.8 g/dL — ABNORMAL LOW (ref 13.0–17.0)
Hgb A1c MFr Bld: 6.7 % — ABNORMAL HIGH (ref 4.6–6.5)
Potassium: 4.8 meq/L (ref 3.5–5.1)
Sodium: 141 meq/L (ref 135–145)

## 2010-10-09 ENCOUNTER — Ambulatory Visit: Payer: Self-pay | Admitting: Family Medicine

## 2010-11-01 ENCOUNTER — Encounter: Payer: Self-pay | Admitting: Neurosurgery

## 2010-11-04 ENCOUNTER — Encounter: Payer: Self-pay | Admitting: Family Medicine

## 2010-11-08 LAB — CONVERTED CEMR LAB
ALT: 34 units/L (ref 0–53)
AST: 37 units/L (ref 0–37)
Albumin: 3.6 g/dL (ref 3.5–5.2)
Alkaline Phosphatase: 49 units/L (ref 39–117)
Bilirubin, Direct: 0.2 mg/dL (ref 0.0–0.3)
Hgb A1c MFr Bld: 6.6 % — ABNORMAL HIGH (ref 4.6–6.0)
PSA: 1.19 ng/mL (ref 0.10–4.00)
Total Bilirubin: 0.8 mg/dL (ref 0.3–1.2)
Total Protein: 7.3 g/dL (ref 6.0–8.3)

## 2010-11-10 NOTE — Assessment & Plan Note (Signed)
Summary: UNABLE TO LIFT RIGHT ARM/RH...   Vital Signs:  Patient profile:   77 year old male Weight:      246 pounds Pulse rate:   82 / minute BP sitting:   132 / 80  (left arm)  Vitals Entered By: Doristine Devoid CMA (May 29, 2010 11:05 AM) CC: R arm tender- no numbness, no problems gripping    History of Present Illness: 77 yo man here today w/ R arm pain.  pain w/ forward flexion or abduction.  get a massage 10 days ago- 'it was deeper than i was used to'.  pain starts in upper arm and radiates into the shoulder.  no weakness of grip, no numbness.  pain started 'several several days' ago.  no swelling, redness.  no known injury.  Current Medications (verified): 1)  Glucophage 1000 Mg  Tabs (Metformin Hcl) .[name] Kitchen.. 1 By Mouth Two Times A Day 2)  Toprol Xl 100 Mg  Tb24 (Metoprolol Succinate) .[name] Kitchen.. 1 Daily 3)  Lipitor 40 Mg  Tabs (Atorvastatin Calcium) .... At Bedtime 4)  Diltiazem Hcl Cr 180 Mg  Cp24 (Diltiazem Hcl) .[name] Kitchen.. 1 Tab Once Daily 5)  Lyrica 50 Mg  Caps (Pregabalin) .[name] Kitchen.. 1tab At Bedtime 6)  Calcium 600 Mg Tabs (Calcium) .... Take One Tablet Daily 7)  Coumadin 5 Mg Tabs (Warfarin Sodium) .... Take One Tablet Daily 8)  Enalapril Maleate 5 Mg Tabs (Enalapril Maleate) .[name] Kitchen.. 1 Tablet By Mouth Daily 9)  Non-Aspirin 325 Mg Tabs (Acetaminophen) 10)  Fish Oil 11)  Zostavax [address] Unt/0.68ml Solr (Zoster Vaccine Live) .[name] Kitchen.. 1 Ml Im X1  Allergies (verified): 1)  ! Pcn 2)  ! Neosporin  Past History:  Past Medical History: Last updated: 08/16/2008 Spinal Stenosis-01/20/2005 Coronary artery disease Hyperlipidemia Hypertension  Arthritis left hand- 12/2005 Neuropathy pain Diabetes mellitus, type II  Dr Amil Amen (Cards)  Past Surgical History: Last updated: 12/22/2009 back surgery for spinal stenosis Cardiac stent Cataract extraction (08/11/2009)--Right  Review of Systems      See HPI  Physical Exam  General:  Well-developed,well-nourished,in no acute distress;  alert,appropriate and cooperative throughout examinationoverweight-appearing.   Msk:  no bony TTP no pain w/ passive ROM pain w/ active ROM over R bicep and shoulder- pain w/ forward flexion and abduction (-) impingement signs Pulses:  +2 radial, ulnar Extremities:  no edema, redness   Impression & Recommendations:  Problem # 1:  ARM, UPPER, PAIN (ICD-719.42) Assessment New appears to be muscular.  due to fact pt is on coumadin will use topical NSAIDs.  add tylenol as needed.  if no improvement will refer to ortho.  Complete Medication List: 1)  Glucophage 1000 Mg Tabs (Metformin hcl) .[name] Kitchen.. 1 by mouth two times a day 2)  Toprol Xl 100 Mg Tb24 (Metoprolol succinate) .[name] Kitchen.. 1 daily 3)  Lipitor 40 Mg Tabs (Atorvastatin calcium) .... At bedtime 4)  Diltiazem Hcl Cr 180 Mg Cp24 (Diltiazem hcl) .[name] Kitchen.. 1 tab once daily 5)  Lyrica 50 Mg Caps (Pregabalin) .[name] Kitchen.. 1tab at bedtime 6)  Calcium 600 Mg Tabs (Calcium) .... Take one tablet daily 7)  Coumadin 5 Mg Tabs (Warfarin sodium) .... Take one tablet daily 8)  Enalapril Maleate 5 Mg Tabs (Enalapril maleate) .[name] Kitchen.. 1 tablet by mouth daily 9)  Non-aspirin 325 Mg Tabs (Acetaminophen) 10)  Fish Oil  11)  Zostavax 60454 Unt/0.89ml Solr (Zoster vaccine live) .[name] Kitchen.. 1 ml im x1 12)  Voltaren 1 % Gel (Diclofenac sodium) .... Apply to affected area every 6 hours  Patient  Instructions: 1)  If no improvement in the next 7-10 days, please call and we'll send you to Ortho 2)  Use the Voltaren Gel as directed for pain and inflammation 3)  Tylenol as needed 4)  Use a heating pad for your muscle soreness 5)  Call with any questions or concerns 6)  Hang in there! Prescriptions: VOLTAREN 1 % GEL (DICLOFENAC SODIUM) apply to affected area every 6 hours  #1 tube x 3   Entered and Authorized by:   Neena Rhymes MD   Signed by:   Neena Rhymes MD on 05/29/2010   Method used:   Electronically to        [facility] [address] (retail)       [phone]       [place], [place]  [address]       Ph: [phone]       Fax: [phone]   RxID:   [id number]

## 2010-11-10 NOTE — Letter (Signed)
Summary: Results Follow up Letter  [facility]  [address] [place], [place] [address]   Phone: [phone]  Fax: [phone]    03/07/2009 MRN: [id number]  ISHAM SMITHERMAN [address] [place], [place]  [address]  Dear Mr. MONTFORT,  The following are the results of your recent test(s):  Test         Result    Pap Smear:        Normal _____  Not Normal _____ Comments: ______________________________________________________ Cholesterol: LDL(Bad cholesterol):         Your goal is less than:         HDL (Good cholesterol):       Your goal is more than: Comments:  ______________________________________________________ Mammogram:        Normal _____  Not Normal _____ Comments:  ___________________________________________________________________ Hemoccult:        Normal _____  Not normal _______ Comments:    _____________________________________________________________________ Other Tests: PLEASE SEE COPY OF LABS FROM 03/05/09 AND COMMENTS    We routinely do not discuss normal results over the telephone.  If you desire a copy of the results, or you have any questions about this information we can discuss them at your next office visit.   Sincerely,

## 2010-11-10 NOTE — Assessment & Plan Note (Signed)
Summary: 3-27month f/u--tl   Vital Signs:  Patient Profile:   77 Years Old Male Weight:      251 pounds Temp:     98.1 degrees F oral Pulse rate:   74 / minute Resp:     16 per minute BP sitting:   138 / 84  (right arm)  Pt. in pain?   no  Vitals Entered By: Ardyth Man (April 26, 2008 9:45 AM)                  PCP:  Laury Axon  Chief Complaint:  4 month check up for dm, bp, cholesterol, and Type 2 diabetes mellitus follow-up.  History of Present Illness:  Type 2 Diabetes Mellitus Follow-Up      This is a 77 year old man who presents for Type 2 diabetes mellitus follow-up.  The patient denies polyuria, polydipsia, blurred vision, self managed hypoglycemia, hypoglycemia requiring help, weight loss, weight gain, and numbness of extremities.  The patient denies the following symptoms: neuropathic pain, chest pain, vomiting, orthostatic symptoms, poor wound healing, intermittent claudication, vision loss, and foot ulcer.  Since the last visit the patient reports poor dietary compliance, compliance with medications, not exercising regularly, and monitoring blood glucose.  The patient has been measuring capillary blood glucose before breakfast.  Since the last visit, the patient reports having had eye care by an ophthalmologist.  Complications from diabetes include peripheral neuropathy.    Hyperlipidemia Follow-Up      The patient also presents for Hyperlipidemia follow-up.  The patient denies muscle aches, GI upset, abdominal pain, flushing, itching, constipation, diarrhea, and fatigue.  The patient denies the following symptoms: chest pain/pressure, exercise intolerance, dypsnea, palpitations, syncope, and pedal edema.  Compliance with medications (by patient report) has been near 100%.  Dietary compliance has been fair.  The patient reports no exercise.  Adjunctive measures currently used by the patient include ASA, fish oil supplements, limiting alcohol consumpton, and weight reduction.     Hypertension Follow-Up      The patient also presents for Hypertension follow-up.  The patient denies lightheadedness, urinary frequency, headaches, edema, impotence, rash, and fatigue.  The patient denies the following associated symptoms: chest pain, chest pressure, exercise intolerance, dyspnea, palpitations, syncope, leg edema, and pedal edema.  Compliance with medications (by patient report) has been near 100%.  The patient reports that dietary compliance has been fair.  The patient reports no exercise.  Adjunctive measures currently used by the patient include salt restriction.      Current Allergies: ! PCN ! NEOSPORIN  Past Medical History:    Reviewed history from 05/12/2007 and no changes required:       Spinal Stenosis-01/20/2005       Coronary artery disease       Hyperlipidemia       Hypertension              Arthritis left hand- 12/2005       Neuropathy pain       Diabetes mellitus, type II   Social History:    Reviewed history from 11/29/2007 and no changes required:       Retired       Never Smoked       Alcohol use-no       Drug use-no       Regular exercise-no       Divorced   Risk Factors:  Exercise:  no   Review of Systems      See  HPI   Physical Exam  General:     Well-developed,well-nourished,in no acute distress; alert,appropriate and cooperative throughout examination Neck:     No deformities, masses, or tenderness noted. Lungs:     Normal respiratory effort, chest expands symmetrically. Lungs are clear to auscultation, no crackles or wheezes. Heart:     normal rate and regular rhythm.   Skin:     Intact without suspicious lesions or rashes Psych:     Oriented X3, memory intact for recent and remote, normally interactive, and good eye contact.    Diabetes Management Exam:    Foot Exam (with socks and/or shoes not present):       Sensory-Pinprick/Light touch:          Left medial foot (L-4): normal          Left dorsal foot (L-5):  normal          Left lateral foot (S-1): normal          Right medial foot (L-4): normal          Right dorsal foot (L-5): normal          Right lateral foot (S-1): normal       Sensory-Monofilament:          Left foot: normal          Right foot: normal       Inspection:          Left foot: normal          Right foot: normal       Nails:          Left foot: normal          Right foot: normal    Eye Exam:       Eye Exam done elsewhere    Impression & Recommendations:  Problem # 1:  DIABETES MELLITUS, TYPE II (ICD-250.00)  His updated medication list for this problem includes:    Glucophage 500 Mg Tabs (Metformin hcl) .[name] Kitchen... Take one table ttwice daily    Enalapril Maleate 2.5 Mg Tabs (Enalapril maleate) .[name] Kitchen... Take one tablet daily  Orders: Venipuncture (03474) TLB-Lipid Panel (80061-LIPID) TLB-BMP (Basic Metabolic Panel-BMET) (80048-METABOL) TLB-Hepatic/Liver Function Pnl (80076-HEPATIC) TLB-CRP-Full Range (C-Reactive Protein) (86140-FCRP) TLB-A1C / Hgb A1C (Glycohemoglobin) (83036-A1C) TLB-Microalbumin/Creat Ratio, Urine (82043-MALB)  Labs Reviewed: HgBA1c: 6.6 (11/29/2007)   Microalbumin: 0.8 (08/31/2007)   Problem # 2:  HYPERTENSION (ICD-401.9)  His updated medication list for this problem includes:    Toprol Xl 100 Mg Tb24 (Metoprolol succinate) .[name] Kitchen..[name] Kitchen Two times a day    Diltiazem Hcl Cr 180 Mg Cp24 (Diltiazem hcl) .[name] Kitchen... 1 tab once daily    Enalapril Maleate 2.5 Mg Tabs (Enalapril maleate) .[name] Kitchen... Take one tablet daily  Orders: Venipuncture (25956) TLB-Lipid Panel (80061-LIPID) TLB-BMP (Basic Metabolic Panel-BMET) (80048-METABOL) TLB-Hepatic/Liver Function Pnl (80076-HEPATIC) TLB-CRP-Full Range (C-Reactive Protein) (86140-FCRP) TLB-A1C / Hgb A1C (Glycohemoglobin) (83036-A1C) TLB-Microalbumin/Creat Ratio, Urine (82043-MALB)  BP today: 138/84 Prior BP: 124/88 (11/29/2007)  Labs Reviewed: Chol: 104 (08/31/2007)   HDL: 27.5 (08/31/2007)   LDL: 54 (08/31/2007)    TG: 114 (08/31/2007)   Problem # 3:  HYPERLIPIDEMIA (ICD-272.4)  His updated medication list for this problem includes:    Lipitor 40 Mg Tabs (Atorvastatin calcium) .[name] Kitchen... At bedtime  Orders: Venipuncture (38756) TLB-Lipid Panel (80061-LIPID) TLB-BMP (Basic Metabolic Panel-BMET) (80048-METABOL) TLB-Hepatic/Liver Function Pnl (80076-HEPATIC) TLB-CRP-Full Range (C-Reactive Protein) (86140-FCRP) TLB-A1C / Hgb A1C (Glycohemoglobin) (83036-A1C) TLB-Microalbumin/Creat Ratio, Urine (82043-MALB)  Labs Reviewed: Chol: 104 (08/31/2007)  HDL: 27.5 (08/31/2007)   LDL: 54 (08/31/2007)   TG: 114 (08/31/2007) SGOT: 28 (08/31/2007)   SGPT: 35 (08/31/2007)   Complete Medication List: 1)  Glucophage 500 Mg Tabs (Metformin hcl) .... Take one table ttwice daily 2)  Toprol Xl 100 Mg Tb24 (Metoprolol succinate) .... Two times a day 3)  Lipitor 40 Mg Tabs (Atorvastatin calcium) .... At bedtime 4)  Diltiazem Hcl Cr 180 Mg Cp24 (Diltiazem hcl) .[name] Kitchen.. 1 tab once daily 5)  Lyrica 50 Mg Caps (Pregabalin) .[name] Kitchen.. 1tab at bedtime 6)  Enalapril Maleate 2.5 Mg Tabs (Enalapril maleate) .... Take one tablet daily    Prescriptions: ENALAPRIL MALEATE 2.5 MG  TABS (ENALAPRIL MALEATE) Take one tablet daily  #30 Each x 2   Entered and Authorized by:   Loreen Freud DO   Signed by:   Loreen Freud DO on 04/26/2008   Method used:   Electronically sent to ...       [facility] [address]       [address]       [place], [place]  [address]       Ph: [phone]       Fax: [phone]   RxID:   [id number] LYRICA 50 MG  CAPS (PREGABALIN) 1Tab at bedtime  #30 x 2   Entered and Authorized by:   Loreen Freud DO   Signed by:   Loreen Freud DO on 04/26/2008   Method used:   Electronically sent to ...       [facility] [address]       [address]       [place], [place]  [address]       Ph: [phone]       Fax: [phone]   RxID:    [id number] TOPROL XL 100 MG  TB24 (METOPROLOL SUCCINATE) two times a day  #60 x 2   Entered and Authorized by:   Loreen Freud DO   Signed by:   Loreen Freud DO on 04/26/2008   Method used:   Electronically sent to ...       [facility] [address]       [address]       [place], [place]  [address]       Ph: [phone]       Fax: [phone]   RxID:   [id number] DILTIAZEM HCL CR 180 MG  CP24 (DILTIAZEM HCL) 1 tab once daily  #30 x 2   Entered and Authorized by:   Loreen Freud DO   Signed by:   Loreen Freud DO on 04/26/2008   Method used:   Electronically sent to ...       [facility] [address]       [address]       [place], [place]  [address]       Ph: [phone]       Fax: [phone]   RxID:   [id number] LIPITOR 40 MG  TABS (ATORVASTATIN CALCIUM) at bedtime  #30 x 2   Entered and Authorized by:   Loreen Freud DO   Signed by:   Loreen Freud DO on 04/26/2008   Method used:   Electronically sent to ...       [facility] [address]  [address]       [place], [place]  [address]       Ph: [phone]       Fax: [phone]   RxID:   [id number]  ]

## 2010-11-10 NOTE — Progress Notes (Signed)
Summary: ekg faxed to [facility]  Phone Note Outgoing Call   Call placed by: Doristine Devoid,  November 04, 2008 8:51 AM Call placed to: Patient Summary of Call: spoke with Dr. Amil Amen nurse at [facility] Cardiologist patient in office today for cpx irregular heartbeat noted on ekg information faxed to [facility] an Dr. Amil Amen nurse stated that once Dr. reviews ekg they will contact patient ...........[name] KitchenDoristine Devoid  November 04, 2008 8:52 AM

## 2010-11-10 NOTE — Assessment & Plan Note (Signed)
Summary: 3 MONTH RETURN /CBS   Vital Signs:  Patient profile:   77 year old male Weight:      234 pounds Pulse rate:   64 / minute BP sitting:   140 / 78  (left arm)  Vitals Entered By: Doristine Devoid (February 03, 2009 8:13 AM) CC: 3 month roa    History of Present Illness: 77 yo man here today to f/u 1) DM- CBGs running 74-120 in AM.  no N/V, diarrhea, myalgias.  recent eye exam at [facility], 1/10- no reported DM retinopathy.  no sores or wounds that won't heal  2) HTN- pt doesn't check BP at home or work.  no HAs, visual changes.  tolerating Toprol, Dilt, Enalapril w/out difficulty.  3) Hyperlipidemia- on lipitor w/out problems.  no N/V, myalgias.  4) A flutter- now on coumadin per Dr Amil Amen [facility] Cards).  following coumadin w/ Cards.  Preventive Screening-Counseling & Management     Alcohol drinks/day: <1     Smoking Status: never     Does Patient Exercise: no  Current Medications (verified): 1)  Glucophage 1000 Mg  Tabs (Metformin Hcl) .[name] Kitchen.. 1 By Mouth Two Times A Day 2)  Toprol Xl 100 Mg  Tb24 (Metoprolol Succinate) .... Two Times A Day 3)  Lipitor 40 Mg  Tabs (Atorvastatin Calcium) .... At Bedtime 4)  Diltiazem Hcl Cr 180 Mg  Cp24 (Diltiazem Hcl) .[name] Kitchen.. 1 Tab Once Daily 5)  Lyrica 50 Mg  Caps (Pregabalin) .[name] Kitchen.. 1tab At Bedtime 6)  Enalapril Maleate 2.5 Mg  Tabs (Enalapril Maleate) .... Take One Tablet Daily 7)  Calcium 600 Mg Tabs (Calcium) .... Take One Tablet Daily 8)  Coumadin 5 Mg Tabs (Warfarin Sodium) .... Take One Tablet Daily  Allergies (verified): 1)  ! Pcn 2)  ! Neosporin  Review of Systems  The patient denies anorexia, fever, weight loss, weight gain, vision loss, decreased hearing, chest pain, syncope, dyspnea on exertion, peripheral edema, headaches, abdominal pain, muscle weakness, suspicious skin lesions, depression, and abnormal bleeding.    Physical Exam  General:  Obese, well-developed,well-nourished,in no acute distress; alert,appropriate and  cooperative throughout examination Head:  Normocephalic and atraumatic without obvious abnormalities. No apparent alopecia or balding. Eyes:  No corneal or conjunctival inflammation noted. EOMI. Perrla. Neck:  No deformities, masses, or tenderness noted. Lungs:  Normal respiratory effort, chest expands symmetrically. Lungs are clear to auscultation, no crackles or wheezes. Heart:  regular S1/S2 Abdomen:  Obese, soft, NT/ND, +BS Pulses:  + 2 carotid, radial, DP Extremities:  trace edema bilaterally   Impression & Recommendations:  Problem # 1:  DIABETES MELLITUS, TYPE II (ICD-250.00) Assessment Unchanged pt's reported CBGs at goal.  no log or meter to review.  asymptomatic.  check A1C- make adjustments as needed.  stressed importance of regular exercise and diet.  Pt expresses understanding and is in agreement w/ this plan. The following medications were removed from the medication list:    Enalapril Maleate 2.5 Mg Tabs (Enalapril maleate) .[name] Kitchen... Take one tablet daily His updated medication list for this problem includes:    Glucophage 1000 Mg Tabs (Metformin hcl) .[name] Kitchen... 1 by mouth two times a day    Enalapril Maleate 5 Mg Tabs (Enalapril maleate) .[name] Kitchen... 1 tablet by mouth daily  Orders: Venipuncture ([address]) TLB-BMP (Basic Metabolic Panel-BMET) (80048-METABOL) TLB-A1C / Hgb A1C (Glycohemoglobin) (83036-A1C)  Problem # 2:  HYPERTENSION (ICD-401.9) Assessment: Unchanged BP mildly elevated today.  will increase enalapril to 5mg  daily.  pt to now check BP at work  2x/week.  will follow closely. The following medications were removed from the medication list:    Enalapril Maleate 2.5 Mg Tabs (Enalapril maleate) .[name] Kitchen... Take one tablet daily His updated medication list for this problem includes:    Toprol Xl 100 Mg Tb24 (Metoprolol succinate) .[name] Kitchen..[name] Kitchen Two times a day    Diltiazem Hcl Cr 180 Mg Cp24 (Diltiazem hcl) .[name] Kitchen... 1 tab once daily    Enalapril Maleate 5 Mg Tabs (Enalapril maleate) .[name] Kitchen... 1  tablet by mouth daily  Problem # 3:  HYPERLIPIDEMIA (ICD-272.4) Assessment: Unchanged pt tolerating statin w/out difficulty.  due to recheck in 3 months.  asymptomatic. His updated medication list for this problem includes:    Lipitor 40 Mg Tabs (Atorvastatin calcium) .[name] Kitchen... At bedtime  Problem # 4:  IRREGULAR HEART RATE (ICD-427.9) Assessment: Improved pt's HR regular today.  following w/ cards for Aflutter- they are monitoring coumadin. His updated medication list for this problem includes:    Toprol Xl 100 Mg Tb24 (Metoprolol succinate) .[name] Kitchen..[name] Kitchen Two times a day    Coumadin 5 Mg Tabs (Warfarin sodium) .[name] Kitchen... Take one tablet daily  Complete Medication List: 1)  Glucophage 1000 Mg Tabs (Metformin hcl) .[name] Kitchen.. 1 by mouth two times a day 2)  Toprol Xl 100 Mg Tb24 (Metoprolol succinate) .... Two times a day 3)  Lipitor 40 Mg Tabs (Atorvastatin calcium) .... At bedtime 4)  Diltiazem Hcl Cr 180 Mg Cp24 (Diltiazem hcl) .[name] Kitchen.. 1 tab once daily 5)  Lyrica 50 Mg Caps (Pregabalin) .[name] Kitchen.. 1tab at bedtime 6)  Calcium 600 Mg Tabs (Calcium) .... Take one tablet daily 7)  Coumadin 5 Mg Tabs (Warfarin sodium) .... Take one tablet daily 8)  Enalapril Maleate 5 Mg Tabs (Enalapril maleate) .[name] Kitchen.. 1 tablet by mouth daily  Patient Instructions: 1)  Please schedule a follow-up appointment in 1 month for BP check and 3 months for diabetes follow up 2)  Keep up the good work- your reported sugars are fantastic! 3)  Try and check your blood pressure 2x/week at work- if the #s are consistently higher than 135/80 please call 4)  Increase your enalapril to 5mg  daily- take 2 of what you have at home and your new prescription will be 1 daily 5)  Call with any questions or concerns 6)  Happy Spring! Prescriptions: ENALAPRIL MALEATE 5 MG TABS (ENALAPRIL MALEATE) 1 tablet by mouth daily  #30 x 3   Entered and Authorized by:   Neena Rhymes MD   Signed by:   Neena Rhymes MD on 02/03/2009   Method used:   Electronically  to        [facility] [address] (retail)       [phone]       [place], [place]  [address]       Ph: [phone]       Fax: [phone]   RxID:   [id number]

## 2010-11-10 NOTE — Letter (Signed)
Summary: Results Follow up Letter  [facility]  [address] [place], [place] [address]   Phone: [phone]  Fax: [phone]    08/21/2008 MRN: [id number]  Marvin Foster [address] [place], [place]  [address]  Dear [name],  The following are the results of your recent test(s):  Test         Result    Pap Smear:        Normal _____  Not Normal _____ Comments: ______________________________________________________ Cholesterol: LDL(Bad cholesterol):         Your goal is less than:         HDL (Good cholesterol):       Your goal is more than: Comments:  ______________________________________________________ Mammogram:        Normal _____  Not Normal _____ Comments:  ___________________________________________________________________ Hemoccult:        Normal _____  Not normal _______ Comments:    _____________________________________________________________________ Other Tests: PLEASE SEE ATTACHED COPY OF LABS FROM 08/16/08- A1C is improving- 7.0 --> 6.8.  Will continue to follow. AST slightly elevated but will just follow this on repeat labs in 1/10.  No changes at this time    We routinely do not discuss normal results over the telephone.  If you desire a copy of the results, or you have any questions about this information we can discuss them at your next office visit.   Sincerely,

## 2010-11-10 NOTE — Assessment & Plan Note (Signed)
Summary: ROA 3 MONTHS,CBS   Vital Signs:  Patient Profile:   77 Years Old Male Weight:      244.50 pounds Temp:     98.6 degrees F oral Resp:     68 per minute BP sitting:   108 / 70  (left arm)  Pt. in pain?   no                  Chief Complaint:  3 month follow up BP Check, Diabetes, and Type 2 diabetes mellitus follow-up.  History of Present Illness:  Type 2 Diabetes Mellitus Follow-Up      This is a 77 year old man who presents for Type 2 diabetes mellitus follow-up.  The patient denies polydipsia, blurred vision, and hypoglycemia requiring help.  The patient denies the following symptoms: chest pain and foot ulcer.  Since the last visit the patient reports good dietary compliance, compliance with medications, and monitoring blood glucose.  The patient has been measuring capillary blood glucose before breakfast.  Blood sugar in the morning about 120.  Hypertension Follow-Up      The patient also presents for Hypertension follow-up.  The patient denies lightheadedness.  The patient denies the following associated symptoms: chest pain.  Compliance with medications (by patient report) has been near 100%.  The patient reports that dietary compliance has been good.    Hyperlipidemia Follow-Up      The patient also presents for Hyperlipidemia follow-up.  Compliance with medications (by patient report) has been near 100%.  Scheduled to see dr. Amil Amen in Nov. We will fax cholesterol report when available  Current Allergies: ! PCN ! NEOSPORIN      Physical Exam  General:     overweight-appearing.  well-nourished and well-hydrated.   Neck:     No deformities, masses, or tenderness noted. Lungs:     Normal respiratory effort, chest expands symmetrically. Lungs are clear to auscultation, no crackles or wheezes. Heart:     Normal rate and regular rhythm. S1 and S2 normal without gallop, murmur, click, rub or other extra sounds. Pulses:     R and L carotid,radial,  Extremities:     No clubbing, cyanosis, edema, or deformity noted with normal full range of motion of all joints.      Impression & Recommendations:  Problem # 1:  DIABETES MELLITUS, TYPE II (ICD-250.00) Previously at goal His updated medication list for this problem includes:    Glucophage 500 Mg Tabs (Metformin hcl)  Labs Reviewed: HgBA1c: 5.9 (05/19/2007)      Problem # 2:  HYPERTENSION (ICD-401.9) At goal His updated medication list for this problem includes:    Toprol Xl 100 Mg Tb24 (Metoprolol succinate) .[name] Kitchen..[name] Kitchen Two times a day    Diltiazem Hcl Cr 180 Mg Cp24 (Diltiazem hcl) .[name] Kitchen... 1 tab once daily  BP today: 108/70 Prior BP: 130/80 (06/06/2007)   Problem # 3:  HYPERLIPIDEMIA (ICD-272.4) Schedule labs His updated medication list for this problem includes:    Lipitor 40 Mg Tabs (Atorvastatin calcium) .[name] Kitchen... At bedtime   Complete Medication List: 1)  Glucophage 500 Mg Tabs (Metformin hcl) 2)  Toprol Xl 100 Mg Tb24 (Metoprolol succinate) .... Two times a day 3)  Lipitor 40 Mg Tabs (Atorvastatin calcium) .... At bedtime 4)  Diltiazem Hcl Cr 180 Mg Cp24 (Diltiazem hcl) .[name] Kitchen.. 1 tab once daily 5)  Lyrica 50 Mg Caps (Pregabalin) .[name] Kitchen.. 1tab at bedtime 6)  Enalapril 2mg   .... 1 tab once daily  Other Orders:  Influenza Vaccine MCR [phone])   Patient Instructions: 1)  Schedule lab: HgbA1c, microalbumin, BMET,  lipid, ast,alt 250.00 2)  Please schedule a follow-up appointment in 4 months.    ]  Influenza Vaccine    Vaccine Type: Fluvax MCR    Site: left deltoid    Mfr: Sanofi Pasteur    Dose: 0.5 ml    Route: IM    Given by: Ardyth Man    Exp. Date: 04/09/2008    Lot #: [id number]    VIS given: 04/09/05 version given August 14, 2007.  Flu Vaccine Consent Questions    Do you have a history of severe allergic reactions to this vaccine? no    Any prior history of allergic reactions to egg and/or gelatin? no    Do you have a sensitivity to the preservative  Thimersol? no    Do you have a past history of Guillan-Barre Syndrome? no    Do you currently have an acute febrile illness? no    Have you ever had a severe reaction to latex? no    Vaccine information given and explained to patient? yes

## 2010-11-10 NOTE — Progress Notes (Signed)
Summary: rx  Phone Note Call from Patient Call back at Home Phone [phone]   Caller: Patient Summary of Call: Patient is call about his prescript been call into the pharm --[facility] [address] [facility] Initial call taken by: Freddy Jaksch,  October 14, 2009 2:23 PM  Follow-up for Phone Call        left message for pt that we re faxed the rx.  Follow-up by: Army Fossa CMA,  October 14, 2009 2:38 PM    Prescriptions: GLUCOPHAGE 1000 MG  TABS (METFORMIN HCL) 1 by mouth two times a day  #60 Each x 1   Entered by:   Army Fossa CMA   Authorized by:   Loreen Freud DO   Signed by:   Army Fossa CMA on 10/14/2009   Method used:   Re-Faxed to ...       [facility] [address] (retail)       [phone]       [place], [place]  [address]       Ph: [phone]       Fax: [phone]   RxID:   [id number]

## 2010-11-10 NOTE — Assessment & Plan Note (Signed)
Summary: RTO 1 MONTH,BP CHECK/CBS   Vital Signs:  Patient profile:   77 year old male Weight:      229.4 pounds BP sitting:   126 / 84  (left arm)  Vitals Entered By: Doristine Devoid (Mar 05, 2009 9:00 AM) CC: 1 month roa-bp check    History of Present Illness: 77 yo man here today for f/u on BP.  BP well controlled on increased enalapril.  tolerating increased dose w/out problems.  no HA, SOB, CP, visual changes, edema.  no cough.    Allergies: 1)  ! Pcn 2)  ! Neosporin  Review of Systems      See HPI  Physical Exam  General:  Obese, well-developed,well-nourished,in no acute distress; alert,appropriate and cooperative throughout examination Lungs:  Normal respiratory effort, chest expands symmetrically. Lungs are clear to auscultation, no crackles or wheezes. Heart:  irreg S1/S2 Pulses:  + 2 carotid, radial, DP Extremities:  trace edema bilaterally   Impression & Recommendations:  Problem # 1:  HYPERTENSION (ICD-401.9) Assessment Improved BP well controlled today w/ increase in ACE.  no changes at this time.  check BMP. His updated medication list for this problem includes:    Toprol Xl 100 Mg Tb24 (Metoprolol succinate) .[name] Kitchen..[name] Kitchen Two times a day    Diltiazem Hcl Cr 180 Mg Cp24 (Diltiazem hcl) .[name] Kitchen... 1 tab once daily    Enalapril Maleate 5 Mg Tabs (Enalapril maleate) .[name] Kitchen... 1 tablet by mouth daily  Orders: Venipuncture ([id number]) TLB-BMP (Basic Metabolic Panel-BMET) (80048-METABOL)  Complete Medication List: 1)  Glucophage 1000 Mg Tabs (Metformin hcl) .[name] Kitchen.. 1 by mouth two times a day 2)  Toprol Xl 100 Mg Tb24 (Metoprolol succinate) .... Two times a day 3)  Lipitor 40 Mg Tabs (Atorvastatin calcium) .... At bedtime 4)  Diltiazem Hcl Cr 180 Mg Cp24 (Diltiazem hcl) .[name] Kitchen.. 1 tab once daily 5)  Lyrica 50 Mg Caps (Pregabalin) .[name] Kitchen.. 1tab at bedtime 6)  Calcium 600 Mg Tabs (Calcium) .... Take one tablet daily 7)  Coumadin 5 Mg Tabs (Warfarin sodium) .... Take one tablet daily 8)   Enalapril Maleate 5 Mg Tabs (Enalapril maleate) .[name] Kitchen.. 1 tablet by mouth daily  Patient Instructions: 1)  Keep your regularly scheduled appt on 7/28 2)  We'll notify you of your lab results 3)  Call with any questions or concerns 4)  Have a great holiday weekend!

## 2010-11-10 NOTE — Assessment & Plan Note (Signed)
Summary: KNEE PAIN AFTER FALL/CBS   Vital Signs:  Patient profile:   77 year old male Weight:      240 pounds Pulse rate:   66 / minute BP sitting:   122 / 64  (left arm)  Vitals Entered By: Doristine Devoid (Feb 18, 2010 2:30 PM) CC: knee pain after fall but R knee worse   History of Present Illness: 77 yo man here today for knee pain.  Monday tripped and fell on both knees.  + bruising.  superficial abrasions on both knees.  now w/ redness and warmth.  pt also w/ nausea.  has been icing knees.  able to bend knees easily, can bear wt.  denies injury to elbows, abd, head.  pt on coumadin.  Allergies (verified): 1)  ! Pcn 2)  ! Neosporin  Review of Systems      See HPI  Physical Exam  General:  Well-developed,well-nourished,in no acute distress; alert,appropriate and cooperative throughout examinationoverweight-appearing.   Msk:  able to fully flex and extend knees bilaterally Pulses:  +2 DP/PT Extremities:  2 x 2 inch hematoma on R anterior upper tibia w/ central abrasion, erythema, induration.  no abscess or fluctuance.  + surrounding bruising.  1/2 inch abrasion on L w/ bruising inferior to abrasion   Impression & Recommendations:  Problem # 1:  CELLULITIS, RIGHT KNEE (ICD-682.6) Assessment New pt's knees have extensive bruising due to coumadin but abrasion on R knee appears to be infected.  start Doxy.  told pt to contact coumadin clinic and alert them to fact he is on abx and needs INR checked early next week.  reviewed supportive care and red flags that should prompt return.  Pt expresses understanding and is in agreement w/ this plan. His updated medication list for this problem includes:    Doxycycline Hyclate 100 Mg Caps (Doxycycline hyclate) .[name] Kitchen... Take 1 tab twice a day  Problem # 2:  ANEMIA (ICD-285.9) Assessment: New pt was informed that he was anemic at last visit and started on iron.  according to notes pt was to have labs rechecked in 1 month.  will check  today. Orders: TLB-CBC Platelet - w/Differential (85025-CBCD)  Complete Medication List: 1)  Glucophage 1000 Mg Tabs (Metformin hcl) .[name] Kitchen.. 1 by mouth two times a day 2)  Toprol Xl 100 Mg Tb24 (Metoprolol succinate) .[name] Kitchen.. 1 daily 3)  Lipitor 40 Mg Tabs (Atorvastatin calcium) .... At bedtime 4)  Diltiazem Hcl Cr 180 Mg Cp24 (Diltiazem hcl) .[name] Kitchen.. 1 tab once daily 5)  Lyrica 50 Mg Caps (Pregabalin) .[name] Kitchen.. 1tab at bedtime 6)  Calcium 600 Mg Tabs (Calcium) .... Take one tablet daily 7)  Coumadin 5 Mg Tabs (Warfarin sodium) .... Take one tablet daily 8)  Enalapril Maleate 5 Mg Tabs (Enalapril maleate) .[name] Kitchen.. 1 tablet by mouth daily 9)  Non-aspirin 325 Mg Tabs (Acetaminophen) 10)  Fish Oil  11)  Zostavax 55732 Unt/0.55ml Solr (Zoster vaccine live) .[name] Kitchen.. 1 ml im x1 12)  Doxycycline Hyclate 100 Mg Caps (Doxycycline hyclate) .... Take 1 tab twice a day  Other Orders: Venipuncture ([id number])  Patient Instructions: 1)  Please call and schedule an appt to have your Coumadin checked on Monday- tell them you are on antibiotics 2)  Take the Doxycycline two times a day with food to avoid upset stomach 3)  Elevate and ice your knee as much as possible 4)  If the bruise is expanding dramatically let us know 5)  If you have streaking redness, fever, worsening pain  or other concerns- please call 6)  Hang in there!! Prescriptions: DOXYCYCLINE HYCLATE 100 MG CAPS (DOXYCYCLINE HYCLATE) Take 1 tab twice a day  #20 x 0   Entered and Authorized by:   Neena Rhymes MD   Signed by:   Neena Rhymes MD on 02/18/2010   Method used:   Electronically to        [facility] [address] (retail)       [phone]       [place], [place]  [address]       Ph: [phone]       Fax: [phone]   RxID:   [id number]

## 2010-11-10 NOTE — Letter (Signed)
Summary: [facility]: Immunoassay Fecal Occult Blood (iFOB) Order Form  [facility] [place]  [address] [place], [place] [address]   Phone: [phone]  Fax: [phone]      [facility]: Immunoassay Fecal Occult Blood (iFOB) Order Form   January 06, 2010 MRN: [id number]   ILIYA SPIVACK 07/26/1934   Physicican Name:__Dr. Myrene Buddy Lowne,DO_______________________  Diagnosis Code:______V76.51____________________      Jeremy Johann CMA

## 2010-11-10 NOTE — Letter (Signed)
[facility]   Imported By: Freddy Jaksch 09/14/2007 10:27:58  _____________________________________________________________________  External Attachment:    Type:   Image     Comment:   External Document

## 2010-11-10 NOTE — Progress Notes (Signed)
Summary: lab results  Phone Note Outgoing Call   Details for Reason: LAB RESULTS: deally your LDL (bad cholesterol) should be <_70___, your HDL (good cholesterol) should be >__40_ and your triglycerides should be< 150.  Diet and exercise will increase HDL and decrease the LDL and triglycerides. Read Dr. Danice Goltz book--Eat Drink and Be Healthy.  Con't Lipitor.  DM is also not controlled.  Your hgba1c should be <_6.5____.  Watch your simple sugars (cakes, cookies) and starches, white flour etc.   Diet and exercise are important for this.  If you have not seen a nutritionist that would be helpful to lower your blood sugar.  Increase the Glucophage 1000 two times a day  #60 2 refills.  We will recheck labs in _3__ months.     250.00  272.4  lipid, hep, bmp, hgba1c  Signed by Loreen Freud DO on 05/07/2008 at 9:33 PM  Summary of Call: copy of labs mailed to pt Left message on machine to return call.................[name] KitchenShary Decamp  May 08, 2008 9:26 AM  discussed with patient .....[name] KitchenMarland KitchenShary Decamp  May 09, 2008 9:10 AM      New/Updated Medications: GLUCOPHAGE 1000 MG  TABS (METFORMIN HCL) 1 by mouth two times a day   Prescriptions: GLUCOPHAGE 1000 MG  TABS (METFORMIN HCL) 1 by mouth two times a day  #60 x 2   Entered by:   Shary Decamp   Authorized by:   Loreen Freud DO   Signed by:   Shary Decamp on 05/09/2008   Method used:   Electronically sent to ...       [facility] [address]       [address]       [place], [place]  [address]       Ph: [phone]       Fax: [phone]   RxID:   [id number]

## 2010-11-10 NOTE — Letter (Signed)
Summary: Consult Scheduled Letter  [facility] [place]  [address] [place], [place] [address]   Phone: [phone]  Fax: [phone]    06/21/2007 MRN: [id number]    Dear Mr. CADAVID,      We have scheduled an appointment for you.  At the recommendation of Dr.Alejandro, we have scheduled you a consult with Dr. Elliot Gurney on September 15th,2008 at 9:00.  Their phone number is [phone].  If this appointment day and time is not convenient for you, please feel free to call the office of the doctor you are being referred to at the number listed above and reschedule the appointment.  If you have any questions, please call and speak with Tiffany [phone].   Thank you,  Corbin Ade at [facility]

## 2010-11-10 NOTE — Letter (Signed)
Summary: [facility]   Imported By: Lanelle Bal 12/16/2008 09:08:32  _____________________________________________________________________  External Attachment:    Type:   Image     Comment:   External Document

## 2010-11-10 NOTE — Assessment & Plan Note (Signed)
Summary: diabetic check/lch   Vital Signs:  Patient profile:   77 year old male Weight:      238 pounds Pulse rate:   66 / minute BP sitting:   110 / 76  (left arm)  Vitals Entered By: Doristine Devoid (Feb 27, 2010 4:07 PM) CC: diabetes f/u   History of Present Illness: 77 yo man here today for DM f/u.  CBGs 101-140 (only 2 readings >125).  asymptomatic- no CP, SOB, N/V, HAs, edema, visual changes.  UTD on cholesterol, eye exam.  cellulitis- redness of both knees dramatically improved  Current Medications (verified): 1)  Glucophage 1000 Mg  Tabs (Metformin Hcl) .[name] Kitchen.. 1 By Mouth Two Times A Day 2)  Toprol Xl 100 Mg  Tb24 (Metoprolol Succinate) .[name] Kitchen.. 1 Daily 3)  Lipitor 40 Mg  Tabs (Atorvastatin Calcium) .... At Bedtime 4)  Diltiazem Hcl Cr 180 Mg  Cp24 (Diltiazem Hcl) .[name] Kitchen.. 1 Tab Once Daily 5)  Lyrica 50 Mg  Caps (Pregabalin) .[name] Kitchen.. 1tab At Bedtime 6)  Calcium 600 Mg Tabs (Calcium) .... Take One Tablet Daily 7)  Coumadin 5 Mg Tabs (Warfarin Sodium) .... Take One Tablet Daily 8)  Enalapril Maleate 5 Mg Tabs (Enalapril Maleate) .[name] Kitchen.. 1 Tablet By Mouth Daily 9)  Non-Aspirin 325 Mg Tabs (Acetaminophen) 10)  Fish Oil 11)  Zostavax [address] Unt/0.17ml Solr (Zoster Vaccine Live) .[name] Kitchen.. 1 Ml Im X1 12)  Doxycycline Hyclate 100 Mg Caps (Doxycycline Hyclate) .... Take 1 Tab Twice A Day  Allergies (verified): 1)  ! Pcn 2)  ! Neosporin  Review of Systems      See HPI  Physical Exam  General:  Well-developed,well-nourished,in no acute distress; alert,appropriate and cooperative throughout examinationoverweight-appearing.   Lungs:  Normal respiratory effort, chest expands symmetrically. Lungs are clear to auscultation, no crackles or wheezes. Heart:  normal rate and no murmur.   Pulses:  +2 DP/PT Extremities:  abrasions now scabbed w/ no surrounding area of erythema.  + bruising of LEs bilaterally   Impression & Recommendations:  Problem # 1:  DIABETES MELLITUS, TYPE II (ICD-250.00) Assessment  Unchanged CBGs well controlled.  due for A1C. His updated medication list for this problem includes:    Glucophage 1000 Mg Tabs (Metformin hcl) .[name] Kitchen... 1 by mouth two times a day    Enalapril Maleate 5 Mg Tabs (Enalapril maleate) .[name] Kitchen... 1 tablet by mouth daily  Orders: Venipuncture ([address])  Problem # 2:  CELLULITIS, RIGHT KNEE (ICD-682.6) Assessment: Improved pt finishing course of doxy.  area much improved. His updated medication list for this problem includes:    Doxycycline Hyclate 100 Mg Caps (Doxycycline hyclate) .[name] Kitchen... Take 1 tab twice a day  Complete Medication List: 1)  Glucophage 1000 Mg Tabs (Metformin hcl) .[name] Kitchen.. 1 by mouth two times a day 2)  Toprol Xl 100 Mg Tb24 (Metoprolol succinate) .[name] Kitchen.. 1 daily 3)  Lipitor 40 Mg Tabs (Atorvastatin calcium) .... At bedtime 4)  Diltiazem Hcl Cr 180 Mg Cp24 (Diltiazem hcl) .[name] Kitchen.. 1 tab once daily 5)  Lyrica 50 Mg Caps (Pregabalin) .[name] Kitchen.. 1tab at bedtime 6)  Calcium 600 Mg Tabs (Calcium) .... Take one tablet daily 7)  Coumadin 5 Mg Tabs (Warfarin sodium) .... Take one tablet daily 8)  Enalapril Maleate 5 Mg Tabs (Enalapril maleate) .[name] Kitchen.. 1 tablet by mouth daily 9)  Non-aspirin 325 Mg Tabs (Acetaminophen) 10)  Fish Oil  11)  Zostavax 09811 Unt/0.83ml Solr (Zoster vaccine live) .[name] Kitchen.. 1 ml im x1 12)  Doxycycline Hyclate 100 Mg Caps (Doxycycline hyclate) .[name] KitchenMarland KitchenMarland Kitchen  Take 1 tab twice a day  Patient Instructions: 1)  Please schedule a follow-up appointment in 4 months to recheck diabetes and cholesterol- do not eat before this appt. 2)  We'll notify you of your lab results 3)  Keep up the good work on healthy food choices and try and get regular exercise 4)  Continue to ice the legs to help with pain and swelling 5)  Have a great summer!!!

## 2010-11-10 NOTE — Assessment & Plan Note (Signed)
Summary: recheck diabetes had lab this am/cbs   Vital Signs:  Patient profile:   77 year old male Height:      70 inches (177.80 cm) Weight:      245.13 pounds (111.42 kg) BMI:     35.30 Temp:     99.1 degrees F (37.28 degrees C) oral BP sitting:   120 / 60  (left arm) Cuff size:   large  Vitals Entered By: Lucious Groves CMA (July 10, 2010 1:51 PM) CC: Follow up DM./kb Is Patient Diabetic? Yes Pain Assessment Patient in pain? yes     Location: shoulder Intensity: 5 Type: dull ache Comments Patient notes that he does have continued shoulder pain. Valtaren did help, but he has ran out./kb   History of Present Illness: 77 yo man here today for  1) DM- A1C has increased to 6.8.  denies symptomatic lows.  recently returned from cruise.  taking only metformin.  denies polyuria.  2) Hyperlipidemia- pt's cholesterol at goal.  tolerating Lipitor.  no abd pain, N/V, myalgias  3) shoulder pain- initially improved w/ Voltaren but ran out of meds.  now w/ dull pain 'most of the time'.  'i'm pretty good w/ mobility', but concerned b/c of lack of improvement.  4) HTN- adequately controlled on Dilt, enalapril, toprol.  no CP, SOB, HAs, visual changes, edema.  5) elevated LFTs- pt was drinking ETOH on cruise.  Current Medications (verified): 1)  Glucophage 1000 Mg  Tabs (Metformin Hcl) .[name] Kitchen.. 1 By Mouth Two Times A Day 2)  Toprol Xl 100 Mg  Tb24 (Metoprolol Succinate) .[name] Kitchen.. 1 Daily 3)  Lipitor 40 Mg  Tabs (Atorvastatin Calcium) .... At Bedtime 4)  Diltiazem Hcl Cr 180 Mg  Cp24 (Diltiazem Hcl) .[name] Kitchen.. 1 Tab Once Daily 5)  Lyrica 50 Mg  Caps (Pregabalin) .[name] Kitchen.. 1tab At Bedtime 6)  Calcium 600 Mg Tabs (Calcium) .... Take One Tablet Daily 7)  Coumadin 5 Mg Tabs (Warfarin Sodium) .... Take One Tablet Daily 8)  Enalapril Maleate 5 Mg Tabs (Enalapril Maleate) .[name] Kitchen.. 1 Tablet By Mouth Daily 9)  Non-Aspirin 325 Mg Tabs (Acetaminophen) 10)  Fish Oil 11)  Zostavax 40981 Unt/0.46ml Solr (Zoster Vaccine  Live) .[name] Kitchen.. 1 Ml Im X1 12)  Voltaren 1 % Gel (Diclofenac Sodium) .... Apply To Affected Area Every 6 Hours  Allergies (verified): 1)  ! Pcn 2)  ! Neosporin  Past History:  Past Medical History: Last updated: 08/16/2008 Spinal Stenosis-01/20/2005 Coronary artery disease Hyperlipidemia Hypertension  Arthritis left hand- 12/2005 Neuropathy pain Diabetes mellitus, type II  Dr Amil Amen (Cards)  Review of Systems      See HPI  Physical Exam  General:  Well-developed,well-nourished,in no acute distress; alert,appropriate and cooperative throughout examination, overweight-appearing.   Head:  Normocephalic and atraumatic without obvious abnormalities. Neck:  No deformities, masses, or tenderness noted. Lungs:  Normal respiratory effort, chest expands symmetrically. Lungs are clear to auscultation, no crackles or wheezes. Heart:  normal rate and no murmur.   Abdomen:  Bowel sounds positive,abdomen soft and non-tender without masses, organomegaly or hernias noted. Msk:  no bony TTP no pain w/ passive ROM pain w/ active ROM over R bicep and shoulder- pain w/ forward flexion and abduction (-) impingement signs Pulses:  +2 radial, DP/PT Extremities:  no C/C/E  Diabetes Management Exam:    Foot Exam (with socks and/or shoes not present):       Sensory-Pinprick/Light touch:          Left medial foot (L-4): normal  Left dorsal foot (L-5): normal          Left lateral foot (S-1): normal          Right medial foot (L-4): normal          Right dorsal foot (L-5): normal          Right lateral foot (S-1): normal       Sensory-Monofilament:          Left foot: normal          Right foot: normal       Inspection:          Left foot: normal          Right foot: normal       Nails:          Left foot: thickened          Right foot: thickened   Impression & Recommendations:  Problem # 1:  DIABETES MELLITUS, TYPE II (ICD-250.00) Assessment Unchanged A1C has increased.  pt to  focus on healthy diet and regular exercise. His updated medication list for this problem includes:    Glucophage 1000 Mg Tabs (Metformin hcl) .[name] Kitchen... 1 by mouth two times a day    Enalapril Maleate 5 Mg Tabs (Enalapril maleate) .[name] Kitchen... 1 tablet by mouth daily  Problem # 2:  HYPERLIPIDEMIA (ICD-272.4) Assessment: Unchanged LDL well controlled.  no changes in meds at this time. His updated medication list for this problem includes:    Lipitor 40 Mg Tabs (Atorvastatin calcium) .[name] Kitchen... At bedtime  Problem # 3:  HYPERTENSION (ICD-401.9) Assessment: Unchanged BP well controlle.d  asymptomatic.  no changes. His updated medication list for this problem includes:    Toprol Xl 100 Mg Tb24 (Metoprolol succinate) .[name] Kitchen... 1 daily    Diltiazem Hcl Cr 180 Mg Cp24 (Diltiazem hcl) .[name] Kitchen... 1 tab once daily    Enalapril Maleate 5 Mg Tabs (Enalapril maleate) .[name] Kitchen... 1 tablet by mouth daily  Problem # 4:  ASPARTATE AMINOTRANSFERASE, SERUM, ELEVATED (ICD-790.4) Assessment: Unchanged pt's LFTs mildly elevated.  consistent w/ drinking pt reports he did on the cruise.  will follow at future visits.  Problem # 5:  ARM, UPPER, PAIN (ICD-719.42) Assessment: Unchanged initially improved w/ voltaren but now again painful.  refer to ortho. Orders: Orthopedic Referral (Ortho)  Complete Medication List: 1)  Glucophage 1000 Mg Tabs (Metformin hcl) .[name] Kitchen.. 1 by mouth two times a day 2)  Toprol Xl 100 Mg Tb24 (Metoprolol succinate) .[name] Kitchen.. 1 daily 3)  Lipitor 40 Mg Tabs (Atorvastatin calcium) .... At bedtime 4)  Diltiazem Hcl Cr 180 Mg Cp24 (Diltiazem hcl) .[name] Kitchen.. 1 tab once daily 5)  Lyrica 50 Mg Caps (Pregabalin) .[name] Kitchen.. 1tab at bedtime 6)  Calcium 600 Mg Tabs (Calcium) .... Take one tablet daily 7)  Coumadin 5 Mg Tabs (Warfarin sodium) .... Take one tablet daily 8)  Enalapril Maleate 5 Mg Tabs (Enalapril maleate) .[name] Kitchen.. 1 tablet by mouth daily 9)  Non-aspirin 325 Mg Tabs (Acetaminophen) 10)  Fish Oil  11)  Zostavax 04540 Unt/0.16ml  Solr (Zoster vaccine live) .[name] Kitchen.. 1 ml im x1 12)  Voltaren 1 % Gel (Diclofenac sodium) .... Apply to affected area every 6 hours  Other Orders: Admin 1st Vaccine (98119) Flu Vaccine 94yrs + (14782)  Patient Instructions: 1)  Schedule a lab visit for 2 weeks to recheck your liver test- avoid alcohol and tylenol until then. 2)  Schedule a follow up with me in 3 months for routine diabetes check- you can  eat before this appt 3)  Try and make healthy food choices and continue your regular exercise 4)  Someone will call you with your Ortho appt 5)  Continue to use the Voltaren gel for pain relief 6)  Call with any questions or concerns 7)  Have a great holiday season!    Flu Vaccine Consent Questions     Do you have a history of severe allergic reactions to this vaccine? no    Any prior history of allergic reactions to egg and/or gelatin? no    Do you have a sensitivity to the preservative Thimersol? no    Do you have a past history of Guillan-Barre Syndrome? no    Do you currently have an acute febrile illness? no    Have you ever had a severe reaction to latex? no    Vaccine information given and explained to patient? yes    Are you currently pregnant? no    Lot Number:[id number]   Exp Date:04/10/2011   Site Given  Left Deltoid IMbflu

## 2010-11-10 NOTE — Progress Notes (Signed)
Summary: discuss labs  Phone Note Outgoing Call   Call placed by: Doristine Devoid,  January 06, 2010 10:48 AM Call placed to: Patient Summary of Call: Great!  except pt is anemic---take slow fe 1 by mouth once daily -----  pt needs FOB if he didn't do one recheck 1 month  285.9  cbcd, ibc panal, ferritin, b12 recheck rest of labs 6 months----bmp, lipid, hep, hgba1c microalbumin  250.00  272.4     Follow-up for Phone Call        left message on machine .....[name] KitchenMarland KitchenDoristine Devoid  January 06, 2010 10:48 AM   Additional Follow-up for Phone Call Additional follow up Details #1::        DISCUSS WITH PATIENT, copy of labs and ifob placed up front for pick-up...........[name] KitchenFelecia Deloach CMA  January 06, 2010 3:36 PM

## 2010-11-10 NOTE — Letter (Signed)
Summary: Results Follow-up Letter  [facility] [place]  [address] [place], [place] [address]   Phone: [phone]  Fax: [phone]    09/14/2007        Marvin Foster [address] [place], [place]  [address]  Dear [name],   The following are the results of your recent test(s):  Test     Result     Pap Smear    Normal_______  Not Normal_____       Comments: _________________________________________________________ Cholesterol LDL(Bad cholesterol):          Your goal is less than:         HDL (Good cholesterol):        Your goal is more than: _________________________________________________________ Other Tests:   _________________________________________________________  Please call for an appointment Or __Please see attached._______________________________________________________ _________________________________________________________ _________________________________________________________  Sincerely,  Ardyth Man [place] at [facility]

## 2010-11-10 NOTE — Progress Notes (Signed)
Summary: RX  Phone Note Refill Request   Refills Requested: Medication #1:  ENALAPRIL MALEATE 5 MG TABS 1 tablet by mouth daily.  Medication #2:  TOPROL XL 100 MG  TB24 1 daily [facility] [address]--[phone]  Initial call taken by: Freddy Jaksch,  November 03, 2009 8:55 AM    Prescriptions: ENALAPRIL MALEATE 5 MG TABS (ENALAPRIL MALEATE) 1 tablet by mouth daily  #30 x 5   Entered by:   Shonna Chock   Authorized by:   Loreen Freud DO   Signed by:   Shonna Chock on 11/03/2009   Method used:   Electronically to        [facility] [address] (retail)       [phone]       [place], [place]  [address]       Ph: [phone]       Fax: [phone]   RxID:   [id number] TOPROL XL 100 MG  TB24 (METOPROLOL SUCCINATE) 1 daily  #30 x 5   Entered by:   Shonna Chock   Authorized by:   Loreen Freud DO   Signed by:   Shonna Chock on 11/03/2009   Method used:   Electronically to        [facility] [address] (retail)       [phone]       [place], [place]  [address]       Ph: [phone]       Fax: [phone]   RxID:   [id number] GLUCOPHAGE 1000 MG  TABS (METFORMIN HCL) 1 by mouth two times a day  #60 Each x 5   Entered by:   Shonna Chock   Authorized by:   Loreen Freud DO   Signed by:   Shonna Chock on 11/03/2009   Method used:   Electronically to        [facility] [address] (retail)       [phone]       [place], [place]  [address]       Ph: [phone]       Fax: [phone]   RxID:   [id number]

## 2010-11-10 NOTE — Procedures (Signed)
Summary: Colonoscopy/[facility]  Colonoscopy/[facility]   Imported By: Lanelle Bal 01/17/2009 14:24:28  _____________________________________________________________________  External Attachment:    Type:   Image     Comment:   External Document

## 2010-11-10 NOTE — Progress Notes (Signed)
Summary: discuss labs  Phone Note Outgoing Call   Call placed by: Doristine Devoid,  May 08, 2009 2:48 PM Call placed to: Patient Summary of Call: A1C looks great!  K+ is mildly elevated.  should have labs rechecked Monday 8/2 to trend.   Follow-up for Phone Call        NO LETTER MAILED spoke with patient aware of labs and appt schedule for 8/2 to have labs rechecked.............[name] KitchenDoristine Devoid  May 08, 2009 2:49 PM

## 2010-11-10 NOTE — Letter (Signed)
Summary: Handout Printed  Printed Handout:  - *[facility] Patient Instructions 

## 2010-11-10 NOTE — Letter (Signed)
Summary: (-) DM eye exam---[facility]  [facility]   Imported By: Freddy Jaksch 01/17/2008 11:01:22  _____________________________________________________________________  External Attachment:    Type:   Image     Comment:   External Document

## 2010-11-10 NOTE — Letter (Signed)
Summary: Results Follow-up Letter  [facility] [place]  [address] [place], [place] [address]   Phone: [phone]  Fax: [phone]    09/06/2007        Marvin Foster [address] [place], [place]  [address]  Dear [name],   The following are the results of your recent test(s):  Test     Result     Pap Smear    Normal_______  Not Normal_____       Comments: _________________________________________________________ Cholesterol LDL(Bad cholesterol):          Your goal is less than:         HDL (Good cholesterol):        Your goal is more than: _________________________________________________________ Other Tests:   _________________________________________________________  Please call for an appointment Or _Please see attached.________________________________________________________ _________________________________________________________ _________________________________________________________  Sincerely,  Ardyth Man [place] at [facility]

## 2010-11-10 NOTE — Assessment & Plan Note (Signed)
Summary: Zostavax//fd  Nurse Visit   Allergies: 1)  ! Pcn 2)  ! Neosporin  Immunizations Administered:  Zostavax # 1:    Vaccine Type: Zostavax    Site: left deltoid    Mfr: Merck    Dose: 0.65 ml    Route: [place]    Given by: Army Fossa CMA    Exp. Date: 05/29/2011    Lot #: [id number]  Orders Added: 1)  Zoster (Shingles) Vaccine Live [90736] 2)  Admin 1st Vaccine [98119]

## 2010-11-10 NOTE — Progress Notes (Signed)
Summary: paz--refill  Phone Note Refill Request   Refills Requested: Medication #1:  DILTIAZEM HCL CR 180 MG  CP24 1 tab once daily  Medication #2:  LIPITOR 40 MG  TABS at bedtime [facility] [address]--ph-[phone] [phone]  Initial call taken by: Freddy Jaksch,  April 15, 2008 11:58 AM  Follow-up for Phone Call        faxed to pharmacy....[name] KitchenMarland KitchenDoristine Devoid  April 15, 2008 4:11 PM       Prescriptions: DILTIAZEM HCL CR 180 MG  CP24 (DILTIAZEM HCL) 1 tab once daily  #30 x 1   Entered by:   Doristine Devoid   Authorized by:   Loreen Freud DO   Signed by:   Doristine Devoid on 04/15/2008   Method used:   Historical   RxID:   [id number] LIPITOR 40 MG  TABS (ATORVASTATIN CALCIUM) at bedtime  #30 x 1   Entered by:   Doristine Devoid   Authorized by:   Loreen Freud DO   Signed by:   Doristine Devoid on 04/15/2008   Method used:   Historical   RxID:   [id number]

## 2010-11-10 NOTE — Assessment & Plan Note (Signed)
Summary: emp/yearly/ph   Vital Signs:  Patient Profile:   77 Years Old Male Weight:      243.6 pounds Pulse rate:   76 / minute Resp:     16 per minute BP sitting:   140 / 80  (left arm)  Vitals Entered By: Doristine Devoid (November 04, 2008 8:07 AM)                 PCP:  Neena Rhymes MD  Chief Complaint:  yearly exam .  History of Present Illness: 77 yo man here today for CPE.    1) DM- CBGs running in the 120s fasting.  no CP, SOB, dizziness, N/V, symptomatic, increased edema.  2) HTN- BP slightly elevated today but admits to being nervous for CPE.  Sees Dr Amil Amen- saw him in 11/09 for 'annual' exam.    3) Health Maintainence- received notice from Dr Ronney Asters that he is due for colonoscopy since it has been 9 yrs.  plans on scheduling.    Prior Medication List:  GLUCOPHAGE 1000 MG  TABS (METFORMIN HCL) 1 by mouth two times a day TOPROL XL 100 MG  TB24 (METOPROLOL SUCCINATE) two times a day LIPITOR 40 MG  TABS (ATORVASTATIN CALCIUM) at bedtime DILTIAZEM HCL CR 180 MG  CP24 (DILTIAZEM HCL) 1 tab once daily LYRICA 50 MG  CAPS (PREGABALIN) 1Tab at bedtime ENALAPRIL MALEATE 2.5 MG  TABS (ENALAPRIL MALEATE) Take one tablet daily   Current Allergies (reviewed today): ! PCN ! NEOSPORIN   Social History:    Reviewed history from 08/16/2008 and no changes required:       International aid/development worker at [employer]       5 children, 12 grandkids- all local       Never Smoked       Alcohol use-no       Drug use-no       Regular exercise-no       Divorced    Review of Systems  The patient denies anorexia, fever, vision loss, hoarseness, chest pain, syncope, dyspnea on exertion, peripheral edema, prolonged cough, headaches, abdominal pain, melena, hematochezia, severe indigestion/heartburn, muscle weakness, suspicious skin lesions, depression, enlarged lymph nodes, and testicular masses.     Physical Exam  General:     Obese, well-developed,well-nourished,in no acute  distress; alert,appropriate and cooperative throughout examination Head:     Normocephalic and atraumatic without obvious abnormalities. No apparent alopecia or balding. Eyes:     No corneal or conjunctival inflammation noted. EOMI. Perrla. Funduscopic exam benign, without hemorrhages, exudates or papilledema. Vision grossly normal. Ears:     External ear exam shows no significant lesions or deformities.  Otoscopic examination reveals clear canals, tympanic membranes are intact bilaterally without bulging, retraction, inflammation or discharge. Hearing is grossly normal bilaterally. Nose:     External nasal examination shows no deformity or inflammation. Nasal mucosa are pink and moist without lesions or exudates. Mouth:     Oral mucosa and oropharynx without lesions or exudates.  Teeth in good repair. Neck:     No deformities, masses, or tenderness noted. Lungs:     Normal respiratory effort, chest expands symmetrically. Lungs are clear to auscultation, no crackles or wheezes. Heart:     irregular S1/S2 Abdomen:     Obese, soft, NT/ND, +BS Rectal:     No external abnormalities noted. Normal sphincter tone. No rectal masses or tenderness. Genitalia:     Testes bilaterally descended without nodularity, tenderness or masses. No scrotal masses or lesions.  No penis lesions or urethral discharge. Prostate:     Prostate gland firm and smooth, no enlargement, nodularity, tenderness, mass, asymmetry or induration. Msk:     No deformity or scoliosis noted of thoracic or lumbar spine.   Pulses:     + 2 carotid, radial, DP Extremities:     no C/C/E Neurologic:     No cranial nerve deficits noted. Station and gait are normal. Plantar reflexes are down-going bilaterally. DTRs are symmetrical throughout. Sensory, motor and coordinative functions appear intact. Skin:     Intact without suspicious lesions or rashes Cervical Nodes:     No lymphadenopathy noted Axillary Nodes:     No palpable  lymphadenopathy Psych:     Oriented X3, memory intact for recent and remote, normally interactive, and good eye contact.    Diabetes Management Exam:    Foot Exam (with socks and/or shoes not present):       Sensory-Pinprick/Light touch:          Left medial foot (L-4): normal          Left dorsal foot (L-5): normal          Left lateral foot (S-1): normal          Right medial foot (L-4): normal          Right dorsal foot (L-5): normal          Right lateral foot (S-1): normal       Sensory-Monofilament:          Left foot: normal          Right foot: normal       Inspection:          Left foot: abnormal             Comments: 2nd toenail bruised          Right foot: normal       Nails:          Left foot: normal          Right foot: normal    Eye Exam:       Eye Exam done elsewhere          Date: 10/09          Results: normal          Done by: [facility]    Impression & Recommendations:  Problem # 1:  HEALTHY ADULT MALE (ICD-V70.0) Pt's PE WNL w/ exception of obesity and irregular HR (see below).  discussed importance of diet and exercise.  pt plans on scheduling appt w/ GI for colonoscopy. Orders: TLB-PSA (Prostate Specific Antigen) ([id number])   Problem # 2:  DIABETES MELLITUS, TYPE II (ICD-250.00) Assessment: Unchanged pt denies sxs.  had recent eye exam w/ new glasses 4 months ago.  will check A1C and determine whether med change is necessary.  stressed importance of diet and exercise. His updated medication list for this problem includes:    Glucophage 1000 Mg Tabs (Metformin hcl) .[name] Kitchen... 1 by mouth two times a day    Enalapril Maleate 2.5 Mg Tabs (Enalapril maleate) .[name] Kitchen... Take one tablet daily  Orders: Venipuncture (29562) TLB-A1C / Hgb A1C (Glycohemoglobin) (83036-A1C)   Problem # 3:  HYPERLIPIDEMIA (ICD-272.4) Assessment: Unchanged Pt's AST elevated on labs drawn at cards in 11/09.  will repeat labs today.  LDL direct was 60 at cardiology office.  continue  Lipitor at this time- pending repeat LFTs His updated medication list for this problem  includes:    Lipitor 40 Mg Tabs (Atorvastatin calcium) .[name] Kitchen... At bedtime   Problem # 4:  IRREGULAR HEART RATE (ICD-427.9) Assessment: New pt's EKG shows afib w/ HR of 91.  pt asymptomatic at this time but w/out hx of similar.  called [facility] and spoke w/ Dr Amil Amen nurse- will fax EKG and they will schedule pt for f/u.  reviewed red flags that should prompt immediate medical attention- including racing heart, SOB, chest pain.  Pt expresses understanding and is in agreement w/ this plan. His updated medication list for this problem includes:    Toprol Xl 100 Mg Tb24 (Metoprolol succinate) .[name] Kitchen..[name] Kitchen Two times a day   Complete Medication List: 1)  Glucophage 1000 Mg Tabs (Metformin hcl) .[name] Kitchen.. 1 by mouth two times a day 2)  Toprol Xl 100 Mg Tb24 (Metoprolol succinate) .... Two times a day 3)  Lipitor 40 Mg Tabs (Atorvastatin calcium) .... At bedtime 4)  Diltiazem Hcl Cr 180 Mg Cp24 (Diltiazem hcl) .[name] Kitchen.. 1 tab once daily 5)  Lyrica 50 Mg Caps (Pregabalin) .[name] Kitchen.. 1tab at bedtime 6)  Enalapril Maleate 2.5 Mg Tabs (Enalapril maleate) .... Take one tablet daily 7)  Calcium 600 Mg Tabs (Calcium) .... Take one tablet daily  Other Orders: TLB-Hepatic/Liver Function Pnl ([id number])   Patient Instructions: 1)  Please schedule a follow-up appointment in 3 months for diabetes and blood pressure. 2)  Your heart rate is irregular- we will fax your EKG to cardiology and someone will be in touch about appts 3)  Good work on the weight loss! 4)  Continue to pay attention to your ADA diet 5)  Try and include regular exercise into your daily routine 6)  We will notify you of your labs 7)  Please call with any questions or concerns!  Appended Document: Orders Update    Clinical Lists Changes  Orders: Added new Service order of EKG w/ Interpretation ([id number]) - Signed

## 2010-11-10 NOTE — Assessment & Plan Note (Signed)
Summary: fasting cpx- jr   Vital Signs:  Patient profile:   77 year old male Height:      70 inches Weight:      237 pounds BMI:     34.13 Temp:     98.3 degrees F oral Pulse rate:   72 / minute Pulse rhythm:   regular BP sitting:   126 / 80  (left arm) Cuff size:   regular  Vitals Entered By: Army Fossa CMA (December 22, 2009 12:46 PM) CC: Pt here for CPX- no concerns.   History of Present Illness: Pt here for cpe ---labs will be done another day.   Type 1 diabetes mellitus follow-up      This is a 77 year old man who presents with Type 2 diabetes mellitus follow-up.  The patient denies polyuria, polydipsia, blurred vision, self managed hypoglycemia, hypoglycemia requiring help, weight loss, weight gain, and numbness of extremities.  The patient denies the following symptoms: neuropathic pain, chest pain, vomiting, orthostatic symptoms, poor wound healing, intermittent claudication, vision loss, and foot ulcer.  Since the last visit the patient reports good dietary compliance, compliance with medications, and monitoring blood glucose.  The patient has been measuring capillary blood glucose before breakfast.  Since the last visit, the patient reports having had eye care by an ophthalmologist and no foot care.    Hypertension follow-up      The patient also presents for Hypertension follow-up.  The patient denies lightheadedness, urinary frequency, headaches, edema, impotence, rash, and fatigue.  The patient denies the following associated symptoms: chest pain, chest pressure, exercise intolerance, dyspnea, palpitations, syncope, leg edema, and pedal edema.  Compliance with medications (by patient report) has been near 100%.  The patient reports that dietary compliance has been good.  The patient reports exercising occasionally.  Adjunctive measures currently used by the patient include salt restriction.    Hyperlipidemia follow-up      The patient also presents for Hyperlipidemia  follow-up.  The patient denies muscle aches, GI upset, abdominal pain, flushing, itching, constipation, diarrhea, and fatigue.  The patient denies the following symptoms: chest pain/pressure, exercise intolerance, dypsnea, palpitations, syncope, and pedal edema.  Compliance with medications (by patient report) has been near 100%.  Dietary compliance has been good.  The patient reports exercising occasionally.  Adjunctive measures currently used by the patient include ASA.  Pt sees Dr Gwen Her and goes to there coumadin clinic monthly.  Preventive Screening-Counseling & Management  Alcohol-Tobacco     Alcohol drinks/day: 0     Smoking Status: never  Caffeine-Diet-Exercise     Caffeine use/day: 4     Does Patient Exercise: no     MSH Depression Score: no depression  Hep-HIV-STD-Contraception     Dental Visit-last 6 months no     Dental Care Counseling: dentures   Safety-Violence-Falls     Firearms in the Home: no firearms in the home     Sexual Abuse: no     Fall Risk: no  Current Medications (verified): 1)  Glucophage 1000 Mg  Tabs (Metformin Hcl) .[name] Kitchen.. 1 By Mouth Two Times A Day 2)  Toprol Xl 100 Mg  Tb24 (Metoprolol Succinate) .[name] Kitchen.. 1 Daily 3)  Lipitor 40 Mg  Tabs (Atorvastatin Calcium) .... At Bedtime 4)  Diltiazem Hcl Cr 180 Mg  Cp24 (Diltiazem Hcl) .[name] Kitchen.. 1 Tab Once Daily 5)  Lyrica 50 Mg  Caps (Pregabalin) .[name] Kitchen.. 1tab At Bedtime 6)  Calcium 600 Mg Tabs (Calcium) .... Take One Tablet Daily 7)  Coumadin 5 Mg Tabs (Warfarin Sodium) .... Take One Tablet Daily 8)  Enalapril Maleate 5 Mg Tabs (Enalapril Maleate) .[name] Kitchen.. 1 Tablet By Mouth Daily 9)  Non-Aspirin 325 Mg Tabs (Acetaminophen) 10)  Fish Oil 11)  Zostavax 42706 Unt/0.47ml Solr (Zoster Vaccine Live) .[name] Kitchen.. 1 Ml Im X1  Allergies: 1)  ! Pcn 2)  ! 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 Father- died at 49 of CHF Mother- died at 52 of CHF 1/2 brother, sister- both deceased  Social History: Reviewed history from 05/07/2009 and no changes required. 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 Eyes:  Denies blurring, discharge, double vision, eye irritation, eye pain, halos, itching, light sensitivity, red eye, vision loss-1 eye, and vision loss-both eyes; optho q1y. ENT:  Denies decreased hearing, difficulty swallowing, ear discharge, earache, hoarseness, nasal congestion, nosebleeds, postnasal drainage, ringing in ears, sinus pressure, and sore throat. CV:  Denies bluish discoloration of lips or nails, chest pain or discomfort, difficulty breathing at night, difficulty breathing while lying down, fainting, fatigue, leg cramps with exertion, lightheadness, near fainting,  palpitations, shortness of breath with exertion, swelling of feet, swelling of hands, and weight gain. Resp:  Denies chest discomfort, chest pain with inspiration, cough, coughing up blood, excessive snoring, hypersomnolence, morning headaches, pleuritic, shortness of breath, sputum productive, and wheezing. GI:  Denies abdominal pain, bloody stools, change in bowel habits, constipation, dark tarry stools, diarrhea, excessive appetite, gas, hemorrhoids, indigestion, loss of appetite, nausea, vomiting, vomiting blood, and yellowish skin color. GU:  Denies decreased libido, discharge, dysuria, erectile dysfunction, genital sores, hematuria, incontinence, nocturia, urinary frequency, and urinary hesitancy. MS:  Denies joint pain, joint redness, joint swelling, loss of strength, low back pain, mid back pain, muscle aches, muscle , cramps, muscle weakness, stiffness, and thoracic pain. Derm:  Denies changes in color of skin, changes in nail beds, dryness, excessive perspiration, flushing, hair loss, insect bite(s), itching, lesion(s), poor wound healing, and rash. Neuro:  Denies brief paralysis, difficulty with concentration, disturbances in coordination, falling down, headaches, inability to speak, memory loss, numbness, poor balance, seizures, sensation of room spinning, tingling, tremors, visual disturbances, and weakness. Psych:  Denies alternate hallucination ( auditory/visual), anxiety, depression, easily angered, easily tearful, irritability, mental problems, panic attacks, sense of great danger, suicidal thoughts/plans, thoughts of violence, unusual visions or sounds, and thoughts /plans of harming others. Endo:  Denies cold intolerance, excessive hunger, excessive thirst, excessive  urination, heat intolerance, polyuria, and weight change. Heme:  Denies abnormal bruising, bleeding, enlarge lymph nodes, fevers, pallor, and skin discoloration. Allergy:  Denies hives or rash, itching eyes, persistent  infections, seasonal allergies, and sneezing.  Physical Exam  General:  Well-developed,well-nourished,in no acute distress; alert,appropriate and cooperative throughout examinationoverweight-appearing.   Head:  Normocephalic and atraumatic without obvious abnormalities. No apparent alopecia or balding. Eyes:  pupils equal, pupils round, pupils reactive to light, and no injection.   Ears:  External ear exam shows no significant lesions or deformities.  Otoscopic examination reveals clear canals, tympanic membranes are intact bilaterally without bulging, retraction, inflammation or discharge. Hearing is grossly normal bilaterally. Nose:  External nasal examination shows no deformity or inflammation. Nasal mucosa are pink and moist without lesions or exudates. Mouth:  Oral mucosa and oropharynx without lesions or exudates.  Teeth in good repair. Neck:  No deformities, masses, or tenderness noted.no carotid bruits.   Chest Wall:  No deformities, masses, tenderness or gynecomastia noted. Lungs:  Normal respiratory effort, chest expands symmetrically. Lungs are clear to auscultation, no crackles or wheezes. Heart:  normal rate and no murmur.   Abdomen:  Bowel sounds positive,abdomen soft and non-tender without masses, organomegaly or hernias noted. Rectal:  No external abnormalities noted. Normal sphincter tone. No rectal masses or tenderness. Genitalia:  Testes bilaterally descended without nodularity, tenderness or masses. No scrotal masses or lesions. No penis lesions or urethral discharge. Prostate:  no nodules, no asymmetry, no induration, and 1+ enlarged.   Msk:  normal ROM, no joint tenderness, no joint swelling, no joint warmth, no redness over joints, no joint deformities, no joint instability, and no crepitation.    Diabetes Management Exam:    Foot Exam (with socks and/or shoes not present):       Sensory-Pinprick/Light touch:          Left medial foot (L-4): normal          Left dorsal  foot (L-5): normal          Left lateral foot (S-1): normal          Right medial foot (L-4): normal          Right dorsal foot (L-5): normal          Right lateral foot (S-1): normal       Sensory-Monofilament:          Left foot: normal          Right foot: normal       Inspection:          Left foot: normal          Right foot: normal       Nails:          Left foot: normal          Right foot: normal    Eye Exam:       Eye Exam done elsewhere   Impression & Recommendations:  Problem # 1:  DIABETES MELLITUS, TYPE II (ICD-250.00)  His updated medication list for this problem includes:    Glucophage 1000 Mg Tabs (Metformin hcl) .[name] Kitchen... 1 by mouth two times a day    Enalapril Maleate 5 Mg Tabs (Enalapril maleate) .[name] Kitchen... 1 tablet by mouth daily  Labs Reviewed: Creat: 1.1 (05/07/2009)    Reviewed HgBA1c results: 6.3 (05/07/2009)  6.5 (02/03/2009)  Orders: EKG w/ Interpretation (93000)  Problem # 2:  HYPERTENSION (ICD-401.9)  His updated medication list for this problem includes:  Toprol Xl 100 Mg Tb24 (Metoprolol succinate) .[name] Kitchen... 1 daily    Diltiazem Hcl Cr 180 Mg Cp24 (Diltiazem hcl) .[name] Kitchen... 1 tab once daily    Enalapril Maleate 5 Mg Tabs (Enalapril maleate) .[name] Kitchen... 1 tablet by mouth daily  BP today: 126/80 Prior BP: 122/74 (05/07/2009)  Labs Reviewed: K+: 5.1 (05/12/2009) Creat: : 1.1 (05/07/2009)   Chol: 106 (05/07/2009)   HDL: 31.60 (05/07/2009)   LDL: 50 (05/07/2009)   TG: 123.0 (05/07/2009)  Orders: EKG w/ Interpretation (93000)  Problem # 3:  HYPERLIPIDEMIA (ICD-272.4)  His updated medication list for this problem includes:    Lipitor 40 Mg Tabs (Atorvastatin calcium) .[name] Kitchen... At bedtime  Labs Reviewed: SGOT: 30 (05/07/2009)   SGPT: 29 (05/07/2009)   HDL:31.60 (05/07/2009), 30.7 (04/26/2008)  LDL:50 (05/07/2009), 66 (04/26/2008)  Chol:106 (05/07/2009), 123 (04/26/2008)  Trig:123.0 (05/07/2009), 130 (04/26/2008)  Orders: EKG w/ Interpretation  (93000)  Problem # 4:  CORONARY ARTERY DISEASE (ICD-414.00)  His updated medication list for this problem includes:    Toprol Xl 100 Mg Tb24 (Metoprolol succinate) .[name] Kitchen... 1 daily    Diltiazem Hcl Cr 180 Mg Cp24 (Diltiazem hcl) .[name] Kitchen... 1 tab once daily    Enalapril Maleate 5 Mg Tabs (Enalapril maleate) .[name] Kitchen... 1 tablet by mouth daily  Labs Reviewed: Chol: 106 (05/07/2009)   HDL: 31.60 (05/07/2009)   LDL: 50 (05/07/2009)   TG: 123.0 (05/07/2009)  Orders: EKG w/ Interpretation (93000)  Problem # 5:  HEALTHY ADULT MALE (ICD-V70.0)  Reviewed preventive care protocols, scheduled due services, and updated immunizations.  Orders: EKG w/ Interpretation (93000)  Complete Medication List: 1)  Glucophage 1000 Mg Tabs (Metformin hcl) .[name] Kitchen.. 1 by mouth two times a day 2)  Toprol Xl 100 Mg Tb24 (Metoprolol succinate) .[name] Kitchen.. 1 daily 3)  Lipitor 40 Mg Tabs (Atorvastatin calcium) .... At bedtime 4)  Diltiazem Hcl Cr 180 Mg Cp24 (Diltiazem hcl) .[name] Kitchen.. 1 tab once daily 5)  Lyrica 50 Mg Caps (Pregabalin) .[name] Kitchen.. 1tab at bedtime 6)  Calcium 600 Mg Tabs (Calcium) .... Take one tablet daily 7)  Coumadin 5 Mg Tabs (Warfarin sodium) .... Take one tablet daily 8)  Enalapril Maleate 5 Mg Tabs (Enalapril maleate) .[name] Kitchen.. 1 tablet by mouth daily 9)  Non-aspirin 325 Mg Tabs (Acetaminophen) 10)  Fish Oil  11)  Zostavax 40102 Unt/0.37ml Solr (Zoster vaccine live) .[name] Kitchen.. 1 ml im x1  Other Orders: Pneumococcal Vaccine (72536) Admin 1st Vaccine (64403) Misc. Referral (Misc. Ref) First annual wellness visit with prevention plan  [phone])  Patient Instructions: 1)  fasting labs Friday----272.4  401.9,  250.00  bmp, cbcd, hep, hgba1c, lipid, psa,  UA,   Prescriptions: ZOSTAVAX 95638 UNT/0.65ML SOLR (ZOSTER VACCINE LIVE) 1 ml IM x1  #1 x 0   Entered and Authorized by:   Loreen Freud DO   Signed by:   Loreen Freud DO on 12/22/2009   Method used:   Print then Give to Patient   RxID:   [id number]    EKG  Procedure  date:  12/22/2009  Findings:      Normal sinus rhythm with rate of:  62 bpm  Last Flu Vaccine:  Fluvax MCR (08/14/2007 11:34:11 AM) Flu Vaccine Result Date:  07/23/2009 Flu Vaccine Result:  given Flu Vaccine Next Due:  1 yr Hemoccult Result Date:  12/22/2009 Hemoccult Result:  normal Hemoccult Next Due:  1 yr       Immunizations Administered:  Pneumonia Vaccine:    Vaccine Type: Pneumovax    Site: left deltoid  Mfr: Merck    Dose: 0.5 ml    Route: IM    Given by: Army Fossa CMA    Exp. Date: 01/29/2011    Lot #: [id number]

## 2010-11-10 NOTE — Letter (Signed)
Summary: Primary Care Appointment Letter  [facility]  [address] [place], [place] [address]   Phone: [phone]  Fax: [phone]    11/17/2009 MRN: [id number]  Marvin Foster [address] [place], [place]  [address]  Dear Marvin Foster,   Your Primary Care Physician Neena Rhymes MD has indicated that:    ____x___it is time to schedule an appointment for FASTING LABS AND OFFICE VISIT.    _______you missed your appointment on______ and need to call and          reschedule.    _______you need to have lab work done.    _______you need to schedule an appointment discuss lab or test results.    _______you need to call to reschedule your appointment that is                       scheduled on _________.     Please call our office as soon as possible. Our phone number is [phone]_____.Our office is open 8a-5p, Monday through Friday.     Thank you,     Primary Care Scheduler

## 2010-11-10 NOTE — Assessment & Plan Note (Signed)
Summary: RTO 3 MONTHS/CBS   Vital Signs:  Patient profile:   77 year old male Weight:      232 pounds Pulse rate:   68 / minute BP sitting:   122 / 74  (left arm)  Vitals Entered By: Doristine Devoid (May 07, 2009 8:20 AM) CC: 3 MONTH ROA    History of Present Illness: 77 yo man here today for f/u on   1) HTN- BP well controlled on enalapril, dilt, toprol.  currently taking Toprol XL two times a day- and insurance is saying they will not pay for 60 monthly.  no CP, SOB, HAs, visual changes, edema  2) DM- CBG of 109 this AM.  usually running under 120.  last eye exam 02/07/08- due for exam.    3) Hyperlipidemia- over due for labs.  fasting this AM.  tolerating lipitor w/out difficulty.  no myalgias, N/V.  4) Irregular heart rate- seeing Dr Amil Amen, currently wearing holter monitor for at least 30 days.   Preventive Screening-Counseling & Management  Alcohol-Tobacco     Alcohol drinks/day: 0     Smoking Status: never  Caffeine-Diet-Exercise     Does Patient Exercise: no      Drug Use:  never.    Current Medications (verified): 1)  Glucophage 1000 Mg  Tabs (Metformin Hcl) .[name] Kitchen.. 1 By Mouth Two Times A Day 2)  Toprol Xl 100 Mg  Tb24 (Metoprolol Succinate) .[name] Kitchen.. 1 Daily 3)  Lipitor 40 Mg  Tabs (Atorvastatin Calcium) .... At Bedtime 4)  Diltiazem Hcl Cr 180 Mg  Cp24 (Diltiazem Hcl) .[name] Kitchen.. 1 Tab Once Daily 5)  Lyrica 50 Mg  Caps (Pregabalin) .[name] Kitchen.. 1tab At Bedtime 6)  Calcium 600 Mg Tabs (Calcium) .... Take One Tablet Daily 7)  Coumadin 5 Mg Tabs (Warfarin Sodium) .... Take One Tablet Daily 8)  Enalapril Maleate 5 Mg Tabs (Enalapril Maleate) .[name] Kitchen.. 1 Tablet By Mouth Daily  Allergies (verified): 1)  ! Pcn 2)  ! 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 No apparent alopecia or balding. Neck:  No deformities, masses, or tenderness noted. Lungs:  Normal respiratory effort, chest expands symmetrically. Lungs are clear to auscultation, no crackles or wheezes. Heart:  reg S1/S2, no M/R/G Abdomen:  Obese, soft, NT/ND, +BS Pulses:  + 2 carotid, radial, DP Extremities:  trace edema bilaterally Neurologic:  alert & oriented X3, cranial nerves II-XII intact, and gait normal.    Diabetes Management Exam:    Foot Exam (with socks and/or shoes not present):       Sensory-Pinprick/Light touch:          Left medial foot (L-4): normal          Left dorsal foot (L-5): normal          Left lateral foot (S-1): normal          Right medial foot (L-4): normal          Right dorsal foot (L-5): normal          Right lateral foot (S-1): normal       Sensory-Monofilament:          Left  foot: normal          Right foot: normal       Inspection:          Left foot: normal          Right foot: normal       Nails:          Left foot: thickened          Right foot: thickened   Impression & Recommendations:  Problem # 1:  HYPERTENSION (ICD-401.9) Assessment Unchanged BP well controlled.  will decrease toprol to once daily as this is a 24 hr medication.  this will comply w/ insurance regulations. His updated medication list for this problem includes:    Toprol Xl 100 Mg Tb24 (Metoprolol succinate) .[name] Kitchen... 1 daily    Diltiazem Hcl Cr 180 Mg Cp24 (Diltiazem hcl) .[name] Kitchen... 1 tab once daily    Enalapril Maleate 5 Mg Tabs (Enalapril maleate) .[name] Kitchen... 1 tablet by mouth daily  Problem # 2:  HYPERLIPIDEMIA (ICD-272.4) Assessment: Unchanged overdue for labs.  check labs today.  tolerating statin w/out difficulty. His updated medication list for this problem includes:    Lipitor 40  Mg Tabs (Atorvastatin calcium) .[name] Kitchen... At bedtime  Orders: TLB-Lipid Panel (80061-LIPID) TLB-Hepatic/Liver Function Pnl (80076-HEPATIC)  Problem # 3:  DIABETES MELLITUS, TYPE II (ICD-250.00) Assessment: Unchanged check A1C today along w/ microalbumin.  encouraged eye exam.  will make med adjustments as needed. His updated medication list for this problem includes:    Glucophage 1000 Mg Tabs (Metformin hcl) .[name] Kitchen... 1 by mouth two times a day    Enalapril Maleate 5 Mg Tabs (Enalapril maleate) .[name] Kitchen... 1 tablet by mouth daily  Orders: Venipuncture (16109) TLB-A1C / Hgb A1C (Glycohemoglobin) (83036-A1C) TLB-BMP (Basic Metabolic Panel-BMET) (80048-METABOL) TLB-Microalbumin/Creat Ratio, Urine (82043-MALB)  Complete Medication List: 1)  Glucophage 1000 Mg Tabs (Metformin hcl) .[name] Kitchen.. 1 by mouth two times a day 2)  Toprol Xl 100 Mg Tb24 (Metoprolol succinate) .[name] Kitchen.. 1 daily 3)  Lipitor 40 Mg Tabs (Atorvastatin calcium) .... At bedtime 4)  Diltiazem Hcl Cr 180 Mg Cp24 (Diltiazem hcl) .[name] Kitchen.. 1 tab once daily 5)  Lyrica 50 Mg Caps (Pregabalin) .[name] Kitchen.. 1tab at bedtime 6)  Calcium 600 Mg Tabs (Calcium) .... Take one tablet daily 7)  Coumadin 5 Mg Tabs (Warfarin sodium) .... Take one tablet daily 8)  Enalapril Maleate 5 Mg Tabs (Enalapril maleate) .[name] Kitchen.. 1 tablet by mouth daily  Patient Instructions: 1)  Please schedule a follow-up appointment in 3 months for diabetes follow up. 2)  Everything looks great!  Keep up the good work 3)  Schedule your eye exam- make sure they dilate your eye and look at the back 4)  We'll notify you of your lab results 5)  Take the Toprol daily (rather than two times a day) 6)  Call with any questions or concerns 7)  ENJOY your retirement!  Prescriptions: TOPROL XL 100 MG  TB24 (METOPROLOL SUCCINATE) 1 daily  #30 x 3   Entered and Authorized by:   Neena Rhymes MD   Signed by:   Neena Rhymes MD on 05/07/2009   Method used:   Electronically to        [facility]  [address] (retail)       [phone]       [place], [place]  [address]       Ph: [phone]       Fax: [phone]  RxID:   [id number]

## 2010-11-10 NOTE — Progress Notes (Signed)
Summary: enalapril refill   Phone Note Refill Request   Refills Requested: Medication #1:  ENALAPRIL MALEATE 5 MG TABS 1 tablet by mouth daily. [facility] [address]-- [phone]  Initial call taken by: Freddy Jaksch,  June 02, 2009 11:32 AM    Prescriptions: ENALAPRIL MALEATE 5 MG TABS (ENALAPRIL MALEATE) 1 tablet by mouth daily  #30 x 3   Entered by:   Doristine Devoid   Authorized by:   Neena Rhymes MD   Signed by:   Doristine Devoid on 06/02/2009   Method used:   Electronically to        [facility] [address] (retail)       [phone]       [place], [place]  [address]       Ph: [phone]       Fax: [phone]   RxID:   [id number]

## 2010-11-10 NOTE — Assessment & Plan Note (Signed)
Summary: Skin biopsy   Vital Signs:  Patient Profile:   77 Years Old Male Temp:     98.2 degrees F oral Pulse rate:   76 / minute Resp:     14 per minute BP sitting:   130 / 80  (right arm)  Pt. in pain?   no  Vitals Entered By: Ardyth Man (June 06, 2007 11:13 AM)                Procedure Note  Biopsy: Biopsy Type: Skin The patient complains of suspicious lesion. Indication: suspicious lesion  Procedure # 1: shave biopsy    Size (in cm): 1.0 x 1.0    Location: Right temporal area    Comment: Verbal Consent obtained after risks procedure reviewed with patient iincluding, but not limited to bleeding,infection and scarring.     Instrument used: #11 blade    Anesthesia: 1% lidocaine w/epinephrine  Cleaned and prepped with: betadine Instructions: daily dressing changes Additional Instructions: 1. Signs of infections reviewed.F/u if they occur. 2. Routine wound care reviewed.    Chief Complaint:  Skin biopsy.  Current Allergies: ! PCN ! NEOSPORIN        Complete Medication List: 1)  Glucophage 500 Mg Tabs (Metformin hcl) 2)  Toprol Xl 100 Mg Tb24 (Metoprolol succinate) .... Two times a day 3)  Lipitor 40 Mg Tabs (Atorvastatin calcium) .... At bedtime 4)  Diltiazem Hcl Cr 180 Mg Cp24 (Diltiazem hcl) .[name] Kitchen.. 1 tab once daily 5)  Lyrica 50 Mg Caps (Pregabalin) .[name] Kitchen.. 1tab at bedtime 6)  Enalapril 2mg   .... 1 tab once daily

## 2010-11-10 NOTE — Letter (Signed)
Summary: Results Follow up Letter  [facility] [place]  [address] [place], [place] [address]   Phone: [phone]  Fax: [phone]    05/13/2009 MRN: [id number]  HULEN MANDLER [address] [place], [place]  [address]  Dear Mr. SCHIPPERS,  The following are the results of your recent test(s):  Test         Result    Pap Smear:        Normal _____  Not Normal _____ Comments: ______________________________________________________ Cholesterol: LDL(Bad cholesterol):         Your goal is less than:         HDL (Good cholesterol):       Your goal is more than: Comments:  ______________________________________________________ Mammogram:        Normal _____  Not Normal _____ Comments:  ___________________________________________________________________ Hemoccult:        Normal _____  Not normal _______ Comments:    _____________________________________________________________________ Other Tests: PLEASE SEE COPY OF LABS FROM 05/12/09 AND COMMENTS    We routinely do not discuss normal results over the telephone.  If you desire a copy of the results, or you have any questions about this information we can discuss them at your next office visit.   Sincerely,

## 2010-11-10 NOTE — Letter (Signed)
Summary: Results Follow-up Letter  [facility] [place]  [address] [place], [place] [address]   Phone: [phone]  Fax: [phone]    11/29/2007        Signa Kell [address] [place], [place]  [address]  Dear Mr. GELB,   The following are the results of your recent test(s):  Test     Result     Pap Smear    Normal_______  Not Normal_____       Comments: _________________________________________________________ Cholesterol LDL(Bad cholesterol):          Your goal is less than:         HDL (Good cholesterol):        Your goal is more than: _________________________________________________________ Other Tests:   _________________________________________________________  Please call for an appointment Or __Please see attached._______________________________________________________ _________________________________________________________ _________________________________________________________  Sincerely,  Ardyth Man [facility]

## 2010-11-10 NOTE — Letter (Signed)
Summary: Results Follow up Letter  [facility] [place]  [address] [place], [place] [address]   Phone: [phone]  Fax: [phone]    05/26/2007 MRN: [id number]  Marvin Foster [address] [place], [place]  [address]  Dear Mr. POPOFF,  The following are the results of your recent test(s):  Test         Result    Pap Smear:        Normal _____  Not Normal _____ Comments: ______________________________________________________ Cholesterol: LDL(Bad cholesterol):         Your goal is less than:         HDL (Good cholesterol):       Your goal is more than: Comments:  ______________________________________________________ Mammogram:        Normal _____  Not Normal _____ Comments:  ___________________________________________________________________ Hemoccult:        Normal _____  Not normal _______ Comments:    _____________________________________________________________________ Other Tests: LABS OKAY! KEEP IT UP.   We routinely do not discuss normal results over the telephone.  If you desire a copy of the results, or you have any questions about this information we can discuss them at your next office visit.   Sincerely,

## 2010-11-10 NOTE — Therapy (Signed)
Summary: Hermelinda Medicus MD ENT  Hermelinda Medicus MD ENT   Imported By: Lanelle Bal 01/09/2010 11:52:56  _____________________________________________________________________  External Attachment:    Type:   Image     Comment:   External Document

## 2010-11-10 NOTE — Letter (Signed)
Summary: Primary Care Appointment Letter  [facility] [place]  [address] [place], [place] [address]   Phone: [phone]  Fax: [phone]    09/04/2008 MRN: [id number]  Marvin Foster [address] [place], [place]  [address]  Dear Marvin Foster,   Your Primary Care Physician Neena Rhymes MD has indicated that:    _______it is time to schedule an appointment.    _______you missed your appointment on______ and need to call and          reschedule.    _______you need to have lab work done.    _______you need to schedule an appointment discuss lab or test results.    ____x___your appt time has been changed from 9:00 am to 8:30 am because of scheduling changing on 10/21/2008     Pleasencall our office if you have problem with the time you were asigned at [phone].    Thank you,    [facility]

## 2010-11-10 NOTE — Assessment & Plan Note (Signed)
Summary: bp check /cbs   History of Present Illness: pt's appt scheduled in error- no need for visit today.  copay refunded and visit rescheduled for appropriate f/u interval.  Allergies: 1)  ! Pcn 2)  ! Neosporin   Impression & Recommendations:  Problem # 1:  HYPERTENSION (ICD-401.9)  His updated medication list for this problem includes:    Toprol Xl 100 Mg Tb24 (Metoprolol succinate) .[name] Kitchen... 1 daily    Diltiazem Hcl Cr 180 Mg Cp24 (Diltiazem hcl) .[name] Kitchen... 1 tab once daily    Enalapril Maleate 5 Mg Tabs (Enalapril maleate) .[name] Kitchen... 1 tablet by mouth daily    His updated medication list for this problem includes:    Toprol Xl 100 Mg Tb24 (Metoprolol succinate) .[name] Kitchen... 1 daily    Diltiazem Hcl Cr 180 Mg Cp24 (Diltiazem hcl) .[name] Kitchen... 1 tab once daily    Enalapril Maleate 5 Mg Tabs (Enalapril maleate) .[name] Kitchen... 1 tablet by mouth daily  Orders: No Charge Patient Arrived (NCPA0) (NCPA0)  Complete Medication List: 1)  Glucophage 1000 Mg Tabs (Metformin hcl) .[name] Kitchen.. 1 by mouth two times a day 2)  Toprol Xl 100 Mg Tb24 (Metoprolol succinate) .[name] Kitchen.. 1 daily 3)  Lipitor 40 Mg Tabs (Atorvastatin calcium) .... At bedtime 4)  Diltiazem Hcl Cr 180 Mg Cp24 (Diltiazem hcl) .[name] Kitchen.. 1 tab once daily 5)  Lyrica 50 Mg Caps (Pregabalin) .[name] Kitchen.. 1tab at bedtime 6)  Calcium 600 Mg Tabs (Calcium) .... Take one tablet daily 7)  Coumadin 5 Mg Tabs (Warfarin sodium) .... Take one tablet daily 8)  Enalapril Maleate 5 Mg Tabs (Enalapril maleate) .[name] Kitchen.. 1 tablet by mouth daily

## 2010-11-10 NOTE — Miscellaneous (Signed)
  Clinical Lists Changes  Observations: Added new observation of COLONOSCOPY: normal (12/23/2008 14:08)      Preventive Care Screening  Colonoscopy:    Date:  12/23/2008    Results:  normal

## 2010-11-10 NOTE — Progress Notes (Signed)
Summary: refill-hopp  Phone Note Refill Request Message from:  Fax from Pharmacy on [facility] [address]  Refills Requested: Medication #1:  TOPROL XL 100 MG  TB24 1 daily Initial call taken by: Jeremy Johann CMA,  October 02, 2009 3:42 PM Caller: Patient    Prescriptions: TOPROL XL 100 MG  TB24 (METOPROLOL SUCCINATE) 1 daily  #30 x 0   Entered by:   Jeremy Johann CMA   Authorized by:   Neena Rhymes MD   Signed by:   Jeremy Johann CMA on 10/02/2009   Method used:   Electronically to        [facility] [address] (retail)       [phone]       [place], [place]  [address]       Ph: [phone]       Fax: [phone]   RxID:   [id number]

## 2010-11-10 NOTE — Progress Notes (Signed)
Summary: labs   Phone Note Outgoing Call   Call placed by: Doristine Devoid,  Feb 20, 2010 3:56 PM Call placed to: Patient Summary of Call: pt remains mildly anemic.  should continue iron.  recheck labs in 1 month to trend.  recent iFob test negative.  will follow closely.   Follow-up for Phone Call        spoke w/ patient aware of labs.....[name] KitchenMarland KitchenDoristine Devoid  Feb 20, 2010 3:56 PM

## 2010-11-10 NOTE — Progress Notes (Signed)
  Phone Note Call from Patient   Reason for Call: Refill Medication Summary of Call: Needs refill on Diltiazem. Leaving town for one week. Initial call taken by: Leanne Chang MD,  April 23, 2007 1:24 PM  Follow-up for Phone Call        Rx call in to [phone]: 1 refill Follow-up by: Leanne Chang MD,  April 23, 2007 1:25 PM

## 2010-11-10 NOTE — Letter (Signed)
Summary: Results Follow up Letter  [facility] [place]  [address] [place], [place] [address]   Phone: [phone]  Fax: [phone]    05/08/2009 MRN: [id number]  Marvin Foster [address] [place], [place]  [address]  Dear [name],  The following are the results of your recent test(s):  Test         Result    Pap Smear:        Normal _____  Not Normal _____ Comments: ______________________________________________________ Cholesterol: LDL(Bad cholesterol):         Your goal is less than:         HDL (Good cholesterol):       Your goal is more than: Comments:  ______________________________________________________ Mammogram:        Normal _____  Not Normal _____ Comments:  ___________________________________________________________________ Hemoccult:        Normal _____  Not normal _______ Comments:    _____________________________________________________________________ Other Tests: PLEASE SEE COPY OF LABS FROM 05/07/09 AND COMMENTS    We routinely do not discuss normal results over the telephone.  If you desire a copy of the results, or you have any questions about this information we can discuss them at your next office visit.   Sincerely,

## 2010-11-10 NOTE — Assessment & Plan Note (Signed)
Summary: ROV 3 MONTHS,CBS   Vital Signs:  Patient Profile:   77 Years Old Male Weight:      242.50 pounds Temp:     97.7 degrees F oral Pulse rate:   74 / minute Resp:     14 per minute BP sitting:   130 / 80  (right arm)  Pt. in pain?   no  Vitals Entered By: Ardyth Man (May 12, 2007 1:54 PM)                Chief Complaint:  Diabetes Management and sore on right side of head above ear and Type 2 diabetes mellitus follow-up.  History of Present Illness:  Type 2 Diabetes Mellitus Follow-Up      This is a 77 year old man who presents for Type 2 diabetes mellitus follow-up.  The patient denies polyuria, polydipsia, blurred vision, weight loss, and weight gain.  The patient denies the following symptoms: chest pain, vision loss, and foot ulcer.  Since the last visit the patient reports good dietary compliance and monitoring blood glucose.  The patient has been measuring capillary blood glucose before breakfast.  Since the last visit, the patient reports having had eye care by an ophthalmologist.    Current Allergies: ! PCN ! NEOSPORIN  Past Medical History:    Spinal Stenosis-01/20/2005    Coronary artery disease    Hyperlipidemia    Hypertension        Arthritis left hand- 12/2005    Neuropathy pain    Diabetes mellitus, type II      Physical Exam  General:     overweight-appearing.   Neck:     No deformities, masses, or tenderness noted. Lungs:     Normal respiratory effort, chest expands symmetrically. Lungs are clear to auscultation, no crackles or wheezes. Heart:     Normal rate and regular rhythm. S1 and S2 normal without gallop, murmur, click, rub or other extra sounds. Skin:     Raised scally mildy erythematous papule on right temporal area  Diabetes Management Exam:    Foot Exam (with socks and/or shoes not present):       Sensory-Monofilament:          Left foot: normal          Right foot: normal       Inspection:          Left foot:  normal          Right foot: normal       Nails:          Left foot: normal          Right foot: normal    Impression & Recommendations:  Problem # 1:  DIABETES MELLITUS, TYPE II (ICD-250.00) Schedule lab Further recommendation after review His updated medication list for this problem includes:    Glucophage 500 Mg Tabs (Metformin hcl)  Labs Reviewed: HgBA1c: 6.1 (12/19/2006)    F/u in 3 months   Problem # 2:  NEVUS, 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  Complete Medication List: 1)  Glucophage 500 Mg Tabs (Metformin hcl) 2)  Toprol Xl 100 Mg Tb24 (Metoprolol succinate) .... Two times a day 3)  Lipitor 40 Mg Tabs (Atorvastatin calcium) .... At bedtime 4)  Diltiazem Hcl Cr 180 Mg Cp24 (Diltiazem hcl) .[name] Kitchen.. 1 tab once daily 5)  Lyrica 50 Mg Caps (Pregabalin) .[name] Kitchen.. 1tab at bedtime 6)  Enalapril 2mg   .... 1 tab once daily  Patient Instructions: 1)  HgbA1c,  glucose: 250.02

## 2010-11-12 NOTE — Progress Notes (Signed)
Summary: Labs order  Phone Note Refill Request   Refills Requested: Medication #1:  GLUCOPHAGE 1000 MG  TABS 1 by mouth two times a day Pt left VM that he is trying to get a 90 day supply of med. Left message to call office to inform Pt that he is due for labs..............[name] KitchenFelecia Deloach CMA  October 01, 2010 2:26 PM    Follow-up for Phone Call        Per labs on 07-09-10 Pt to recheck labs in 3 month. Pls advise on orders.......[name] KitchenFelecia Deloach CMA  October 02, 2010 2:56 PM   Additional Follow-up for Phone Call Additional follow up Details #1::        he needs an appt w/ me for DM followup.  will get A1C and BMP at that time.  can get 3 month supply of meds. Additional Follow-up by: Neena Rhymes MD,  October 02, 2010 3:02 PM    Additional Follow-up for Phone Call Additional follow up Details #2::    orders added to lab appt..........[name] KitchenFelecia Deloach CMA  October 02, 2010 4:35 PM

## 2010-11-12 NOTE — Assessment & Plan Note (Signed)
Summary: 3 MONTH FOLLOWUP///SPH   Vital Signs:  Patient profile:   77 year old male Weight:      246 pounds BMI:     35.42 Pulse rate:   80 / minute BP sitting:   126 / 80  (left arm)  Vitals Entered By: Doristine Devoid CMA (October 09, 2010 1:53 PM) CC: DM f/u    History of Present Illness: 77 yo man here today for f/u on DM.  on Metformin.  needs 90 day script.  no symptomatic lows.  UTD on eye exam.  no numbness or tingling of feet.  no CP, SOB, HAs, visual changes, edema.  Current Medications (verified): 1)  Glucophage 1000 Mg  Tabs (Metformin Hcl) .[name] Kitchen.. 1 By Mouth Two Times A Day 2)  Toprol Xl 100 Mg  Tb24 (Metoprolol Succinate) .[name] Kitchen.. 1 Daily 3)  Lipitor 40 Mg  Tabs (Atorvastatin Calcium) .... At Bedtime 4)  Diltiazem Hcl Cr 180 Mg  Cp24 (Diltiazem Hcl) .[name] Kitchen.. 1 Tab Once Daily 5)  Lyrica 50 Mg  Caps (Pregabalin) .[name] Kitchen.. 1tab At Bedtime 6)  Calcium 600 Mg Tabs (Calcium) .... Take One Tablet Daily 7)  Coumadin 5 Mg Tabs (Warfarin Sodium) .... Take One Tablet Daily 8)  Enalapril Maleate 5 Mg Tabs (Enalapril Maleate) .[name] Kitchen.. 1 Tablet By Mouth Daily 9)  Non-Aspirin 325 Mg Tabs (Acetaminophen) 10)  Fish Oil 11)  Zostavax 54098 Unt/0.69ml Solr (Zoster Vaccine Live) .[name] Kitchen.. 1 Ml Im X1 12)  Voltaren 1 % Gel (Diclofenac Sodium) .... Apply To Affected Area Every 6 Hours  Allergies (verified): 1)  ! Pcn 2)  ! Neosporin  Review of Systems      See HPI  Physical Exam  General:  Well-developed,well-nourished,in no acute distress; alert,appropriate and cooperative throughout examination, overweight-appearing.   Head:  Normocephalic and atraumatic without obvious abnormalities. Neck:  No deformities, masses, or tenderness noted. Lungs:  Normal respiratory effort, chest expands symmetrically. Lungs are clear to auscultation, no crackles or wheezes. Heart:  normal rate and no murmur.   Abdomen:  Bowel sounds positive,abdomen soft and non-tender without masses, organomegaly or hernias  noted. Pulses:  +2 radial, DP/PT Extremities:  no C/C/E   Impression & Recommendations:  Problem # 1:  DIABETES MELLITUS, TYPE II (ICD-250.00) Assessment Unchanged pt's A1C slightly better than last check.  applauded his efforts.  continue current meds.  UTD on eye exam.  will do foot exam at CPE in 3 months. His updated medication list for this problem includes:    Glucophage 1000 Mg Tabs (Metformin hcl) .[name] Kitchen... 1 by mouth two times a day    Enalapril Maleate 5 Mg Tabs (Enalapril maleate) .[name] Kitchen... 1 tablet by mouth daily  Complete Medication List: 1)  Glucophage 1000 Mg Tabs (Metformin hcl) .[name] Kitchen.. 1 by mouth two times a day 2)  Toprol Xl 100 Mg Tb24 (Metoprolol succinate) .[name] Kitchen.. 1 daily 3)  Lipitor 40 Mg Tabs (Atorvastatin calcium) .... At bedtime 4)  Diltiazem Hcl Cr 180 Mg Cp24 (Diltiazem hcl) .[name] Kitchen.. 1 tab once daily 5)  Lyrica 50 Mg Caps (Pregabalin) .[name] Kitchen.. 1tab at bedtime 6)  Calcium 600 Mg Tabs (Calcium) .... Take one tablet daily 7)  Coumadin 5 Mg Tabs (Warfarin sodium) .... Take one tablet daily 8)  Enalapril Maleate 5 Mg Tabs (Enalapril maleate) .[name] Kitchen.. 1 tablet by mouth daily 9)  Non-aspirin 325 Mg Tabs (Acetaminophen) 10)  Fish Oil  11)  Zostavax 11914 Unt/0.41ml Solr (Zoster vaccine live) .[name] Kitchen.. 1 ml im x1 12)  Voltaren 1 %  Gel (Diclofenac sodium) .... Apply to affected area every 6 hours  Patient Instructions: 1)  Please schedule your complete physical in 3 months- do not eat before this appt 2)  Keep up the good work on healthy food choices and try and get regular exercise 3)  Call with any questions or concerns 4)  Happy New Year!!! Prescriptions: GLUCOPHAGE 1000 MG  TABS (METFORMIN HCL) 1 by mouth two times a day  #180 x 3   Entered and Authorized by:   Neena Rhymes MD   Signed by:   Neena Rhymes MD on 10/09/2010   Method used:   Electronically to        [facility] [address] (retail)       [phone]       [place], [place]   [address]       Ph: [phone]       Fax: [phone]   RxID:   [id number]    Orders Added: 1)  Est. Patient Level III [[address]]

## 2010-11-18 NOTE — Letter (Signed)
Summary: [facility]  [facility]   Imported By: Lanelle Bal 11/13/2010 09:45:00  _____________________________________________________________________  External Attachment:    Type:   Image     Comment:   External Document

## 2010-12-03 ENCOUNTER — Encounter: Payer: Self-pay | Admitting: Family Medicine

## 2010-12-17 NOTE — Letter (Signed)
Summary: Request for Surgical Clearance/[facility]  Request for Surgical Clearance/[facility]   Imported By: Maryln Gottron 12/08/2010 14:15:58  _____________________________________________________________________  External Attachment:    Type:   Image     Comment:   External Document

## 2010-12-24 ENCOUNTER — Encounter (INDEPENDENT_AMBULATORY_CARE_PROVIDER_SITE_OTHER): Payer: PRIVATE HEALTH INSURANCE | Admitting: Family Medicine

## 2010-12-24 ENCOUNTER — Other Ambulatory Visit: Payer: Self-pay | Admitting: Family Medicine

## 2010-12-24 ENCOUNTER — Encounter: Payer: Self-pay | Admitting: Family Medicine

## 2010-12-24 DIAGNOSIS — I10 Essential (primary) hypertension: Secondary | ICD-10-CM

## 2010-12-24 DIAGNOSIS — E119 Type 2 diabetes mellitus without complications: Secondary | ICD-10-CM

## 2010-12-24 DIAGNOSIS — Z0000 Encounter for general adult medical examination without abnormal findings: Secondary | ICD-10-CM

## 2010-12-24 DIAGNOSIS — E785 Hyperlipidemia, unspecified: Secondary | ICD-10-CM

## 2010-12-25 LAB — CBC WITH DIFFERENTIAL/PLATELET
Basophils Absolute: 0 10*3/uL (ref 0.0–0.1)
Basophils Relative: 0.3 % (ref 0.0–3.0)
Eosinophils Absolute: 0.2 10*3/uL (ref 0.0–0.7)
Eosinophils Relative: 4.7 % (ref 0.0–5.0)
HCT: 37.3 % — ABNORMAL LOW (ref 39.0–52.0)
Hemoglobin: 12.9 g/dL — ABNORMAL LOW (ref 13.0–17.0)
Lymphocytes Relative: 49.8 % — ABNORMAL HIGH (ref 12.0–46.0)
Lymphs Abs: 2.5 10*3/uL (ref 0.7–4.0)
MCHC: 34.6 g/dL (ref 30.0–36.0)
MCV: 95.3 fl (ref 78.0–100.0)
Monocytes Absolute: 0.5 10*3/uL (ref 0.1–1.0)
Monocytes Relative: 9.4 % (ref 3.0–12.0)
Neutro Abs: 1.8 10*3/uL (ref 1.4–7.7)
Neutrophils Relative %: 35.8 % — ABNORMAL LOW (ref 43.0–77.0)
Platelets: 152 10*3/uL (ref 150.0–400.0)
RBC: 3.92 Mil/uL — ABNORMAL LOW (ref 4.22–5.81)
RDW: 13.7 % (ref 11.5–14.6)
WBC: 4.9 10*3/uL (ref 4.5–10.5)

## 2010-12-25 LAB — BASIC METABOLIC PANEL
BUN: 21 mg/dL (ref 6–23)
CO2: 28 mEq/L (ref 19–32)
Calcium: 9.2 mg/dL (ref 8.4–10.5)
Chloride: 104 mEq/L (ref 96–112)
Creatinine, Ser: 1.4 mg/dL (ref 0.4–1.5)
GFR: 53.38 mL/min — ABNORMAL LOW (ref >60.00)
Glucose, Bld: 112 mg/dL — ABNORMAL HIGH (ref 70–99)
Potassium: 4.9 mEq/L (ref 3.5–5.1)
Sodium: 139 mEq/L (ref 135–145)

## 2010-12-25 LAB — PSA: PSA: 1.29 ng/mL (ref 0.10–4.00)

## 2010-12-25 LAB — TSH: TSH: 2.49 u[IU]/mL (ref 0.35–5.50)

## 2010-12-25 LAB — HEMOGLOBIN A1C: Hgb A1c MFr Bld: 6.6 % — ABNORMAL HIGH (ref 4.6–6.5)

## 2011-01-07 NOTE — Assessment & Plan Note (Signed)
Summary: CPX/LCH   Vital Signs:  Patient profile:   77 year old male Height:      70 inches (177.80 cm) Weight:      248.38 pounds (112.90 kg) BMI:     35.77 Temp:     98.7 degrees F (37.06 degrees C) oral BP sitting:   120 / 64  (left arm) Cuff size:   large  Vitals Entered By: Lucious Groves CMA (December 24, 2010 3:00 PM) CC: CPX./kb Is Patient Diabetic? No Pain Assessment Patient in pain? no      Comments Patient notes he has upcoming shoulder surgery for 01-14-11.   History of Present Illness: 77 yo man here today for CPE.    Here for Medicare AWV:  1.   Risk factors based on Past M, S, F history: HTN- BP well controlled.  no CP, SOB, HAs, visual changes, edema Hyperlipidemia- due for labs today.  on Fish Oil and Lipitor- no N/V, myalgias.  recent panel at cards showed good control. DM- CBGs running 'really good' but this week CBGs are in the 130s.  no symptomatic lows.  UTD on eye exam. 2.   Physical Activities: volunteers regularly at [facility], walking 3.   Depression/mood: no depression or anxiety 4.   Hearing: normal to whispered voice at 6 ft 5.   ADL's: independent 6.   Fall Risk: low risk 7.   Home Safety: safe at home 8.   Height, weight, &visual acuity: see vitals, vision corrected to 20/20 w/ glasses 9.   Counseling: provided on healthy diet and regular exercise 10.   Labs ordered based on risk factors: see A&P 11.           Referral Coordination: UTD on colonoscopy 12.           Care Plan: see A&P 13.           Cognitive Assessment: normal linear thought process, memory intact, no deficits noted.  Preventive Screening-Counseling & Management  Alcohol-Tobacco     Alcohol drinks/day: <1     Smoking Status: never  Current Medications (verified): 1)  Glucophage 1000 Mg  Tabs (Metformin Hcl) .[name] Kitchen.. 1 By Mouth Two Times A Day 2)  Toprol Xl 100 Mg  Tb24 (Metoprolol Succinate) .[name] Kitchen.. 1 Daily 3)  Lipitor 40 Mg  Tabs (Atorvastatin Calcium) .... At Bedtime 4)   Diltiazem Hcl Cr 180 Mg  Cp24 (Diltiazem Hcl) .[name] Kitchen.. 1 Tab Once Daily 5)  Lyrica 50 Mg  Caps (Pregabalin) .[name] Kitchen.. 1tab At Bedtime 6)  Calcium 600 Mg Tabs (Calcium) .... Take One Tablet Daily 7)  Coumadin 5 Mg Tabs (Warfarin Sodium) .... Take One Tablet Daily 8)  Enalapril Maleate 5 Mg Tabs (Enalapril Maleate) .[name] Kitchen.. 1 Tablet By Mouth Daily 9)  Non-Aspirin 325 Mg Tabs (Acetaminophen) 10)  Fish Oil 11)  Voltaren 1 % Gel (Diclofenac Sodium) .... Apply To Affected Area Every 6 Hours  Allergies (verified): 1)  ! Pcn 2)  ! Neosporin  Past History:  Past medical, surgical, family and social histories (including risk factors) reviewed, and no changes noted (except as noted below).  Past Medical History: Spinal Stenosis-01/20/2005 Coronary artery disease Hyperlipidemia Hypertension Arthritis left hand- 12/2005 Neuropathy pain Diabetes mellitus, type II R shoulder  Dr Anne Fu (Cards)  Past Surgical History: Reviewed history from 12/22/2009 and no changes required. back surgery for spinal stenosis Cardiac stent Cataract extraction (08/11/2009)--Right  Family History: Reviewed history from 08/16/2008 and no changes required. Father- died at 85 of CHF Mother-  died at 58 of CHF 1/2 brother, sister- both deceased  Social History: Reviewed history from 05/07/2009 and no changes required. 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    Physical Exam  General:   Well-developed,well-nourished,in no acute distress; alert,appropriate and cooperative throughout examination, overweight-appearing.   Head:  Normocephalic and atraumatic without obvious abnormalities. Eyes:  pupils equal, pupils round, pupils reactive to light, and no injection.   Ears:  External ear exam shows no significant lesions or deformities.  Otoscopic examination reveals clear canals, tympanic membranes are intact bilaterally without bulging, retraction, inflammation or discharge. Hearing is grossly normal bilaterally. Nose:  External nasal examination shows no deformity or inflammation. Nasal mucosa are pink and moist without lesions or exudates. Mouth:  Oral mucosa and oropharynx without lesions or exudates.  Teeth in good repair. Neck:  No deformities, masses, or tenderness noted. Lungs:  Normal respiratory effort, chest expands symmetrically. Lungs are clear to auscultation, no crackles or wheezes. Heart:  normal rate and no murmur.   Abdomen:  Bowel sounds positive,abdomen soft and non-tender without masses, organomegaly or hernias noted. Rectal:  No external abnormalities noted. Normal sphincter tone. No rectal masses or tenderness. Genitalia:  Testes bilaterally descended without nodularity, tenderness or masses. No scrotal masses or lesions. No penis lesions or urethral discharge. Prostate:  no nodules, no asymmetry, no induration, and 1+ enlarged.   Pulses:  +2 carotid, radial, DP/PT Extremities:  no C/C/E Neurologic:  alert & oriented X3, cranial nerves II-XII intact, and gait normal.   Skin:  Intact without suspicious lesions or rashes Cervical Nodes:  No lymphadenopathy noted Inguinal Nodes:  No significant adenopathy Psych:  Cognition and judgment appear intact. Alert and cooperative with normal attention span and concentration. No apparent delusions, illusions, hallucinations   Impression & Recommendations:  Problem # 1:  HEALTHY ADULT MALE (ICD-V70.0) Assessment  Unchanged pt's PE WNL.  check labs.  UTD on health maintainence. Orders: Specimen Handling (34742) TLB-PSA (Prostate Specific Antigen) (84153-PSA) [facility] -Subsequent Annual Wellness Visit [phone])  Problem # 2:  HYPERTENSION (ICD-401.9) Assessment: Unchanged well controlled today.  asymptomatic.  no changes. His updated medication list for this problem includes:    Toprol Xl 100 Mg Tb24 (Metoprolol succinate) .[name] Kitchen... 1 daily    Diltiazem Hcl Cr 180 Mg Cp24 (Diltiazem hcl) .[name] Kitchen... 1 tab once daily    Enalapril Maleate 5 Mg Tabs (Enalapril maleate) .[name] Kitchen... 1 tablet by mouth daily  Orders: Specimen Handling (87564) TLB-CBC Platelet - w/Differential (85025-CBCD) TLB-TSH (Thyroid Stimulating Hormone) (84443-TSH)  Problem # 3:  HYPERLIPIDEMIA (ICD-272.4) Assessment: Unchanged reviewed lipids from cards.  no changes. His updated medication list for this problem includes:    Lipitor 40 Mg Tabs (Atorvastatin calcium) .[name] Kitchen... At bedtime  Problem # 4:  DIABETES MELLITUS, TYPE II (ICD-250.00) Assessment: Unchanged due for A1C today.  adjust meds as needed. His updated medication list for this problem includes:    Glucophage 1000 Mg Tabs (Metformin hcl) .[name] Kitchen... 1 by mouth two times a day    Enalapril Maleate 5 Mg Tabs (Enalapril maleate) .[name] Kitchen... 1 tablet by mouth daily  Orders: Venipuncture (33295) Specimen  Handling (62130) TLB-A1C / Hgb A1C (Glycohemoglobin) (83036-A1C) TLB-BMP (Basic Metabolic Panel-BMET) (80048-METABOL)  Complete Medication List: 1)  Glucophage 1000 Mg Tabs (Metformin hcl) .[name] Kitchen.. 1 by mouth two times a day 2)  Toprol Xl 100 Mg Tb24 (Metoprolol succinate) .[name] Kitchen.. 1 daily 3)  Lipitor 40 Mg Tabs (Atorvastatin calcium) .... At bedtime 4)  Diltiazem Hcl Cr 180 Mg Cp24 (Diltiazem hcl) .[name] Kitchen.. 1 tab once daily 5)  Lyrica 50 Mg Caps (Pregabalin) .[name] Kitchen.. 1tab at bedtime 6)  Calcium 600 Mg Tabs (Calcium) .... Take one tablet daily 7)  Coumadin 5 Mg Tabs (Warfarin sodium) .... Take one tablet daily 8)   Enalapril Maleate 5 Mg Tabs (Enalapril maleate) .[name] Kitchen.. 1 tablet by mouth daily 9)  Non-aspirin 325 Mg Tabs (Acetaminophen) 10)  Fish Oil  11)  Voltaren 1 % Gel (Diclofenac sodium) .... Apply to affected area every 6 hours  Patient Instructions: 1)  Follow up in 3 months to recheck diabetes- you do not have to fast for this appt 2)  We'll notify you of your lab results 3)  GOOD LUCK WITH SURGERY!!!!   Orders Added: 1)  Venipuncture [86578] 2)  Specimen Handling [99000] 3)  TLB-A1C / Hgb A1C (Glycohemoglobin) [83036-A1C] 4)  TLB-BMP (Basic Metabolic Panel-BMET) [80048-METABOL] 5)  TLB-CBC Platelet - w/Differential [85025-CBCD] 6)  TLB-TSH (Thyroid Stimulating Hormone) [84443-TSH] 7)  TLB-PSA (Prostate Specific Antigen) [84153-PSA] 8)  MC -Subsequent Annual Wellness Visit [G0439] 9)  Est. Patient Level IV [46962]

## 2011-02-18 ENCOUNTER — Encounter: Payer: Self-pay | Admitting: Family Medicine

## 2011-02-20 ENCOUNTER — Ambulatory Visit (INDEPENDENT_AMBULATORY_CARE_PROVIDER_SITE_OTHER): Payer: PRIVATE HEALTH INSURANCE | Admitting: Internal Medicine

## 2011-02-20 ENCOUNTER — Encounter: Payer: Self-pay | Admitting: Internal Medicine

## 2011-02-20 VITALS — BP 128/72 | HR 80 | Temp 98.0°F | Ht 70.0 in | Wt 248.5 lb

## 2011-02-20 DIAGNOSIS — S8010XA Contusion of unspecified lower leg, initial encounter: Secondary | ICD-10-CM

## 2011-02-20 DIAGNOSIS — S8011XA Contusion of right lower leg, initial encounter: Secondary | ICD-10-CM

## 2011-02-20 MED ORDER — CEPHALEXIN 500 MG PO TABS: 500.0000 mg | ORAL_TABLET | Freq: Four times a day (QID) | ORAL | Status: AC

## 2011-02-20 NOTE — Progress Notes (Signed)
Subjective:    Patient ID: Marvin Foster, male    DOB: [birth year], 77 y.o.   MRN: [id number]  HPI Larey Seat 8 days ago and hit right knee Also scratched glasses and forehead  Daughter has treated with elevation and ice Now worried about infection  Is more "feverish" in the leg Chronic swelling  Pain to palpation at impact point (tibial tuberosity) Pain if he fully flexes the knee  Current outpatient prescriptions:acetaminophen (TYLENOL) 325 MG tablet, Take 650 mg by mouth as directed.  , Disp: , Rfl: ;  atorvastatin (LIPITOR) 40 MG tablet, Take 40 mg by mouth at bedtime.  , Disp: , Rfl: ;  Calcium Carbonate-Vitamin D (CALCIUM 600 + D PO), Take by mouth daily.  , Disp: , Rfl: ;  diltiazem (DILACOR XR) 180 MG 24 hr capsule, Take 180 mg by mouth daily.  , Disp: , Rfl:  enalapril (VASOTEC) 5 MG tablet, Take 5 mg by mouth daily.  , Disp: , Rfl: ;  fish oil-omega-3 fatty acids 1000 MG capsule, Take 4 g by mouth daily. , Disp: , Rfl: ;  metFORMIN (GLUCOPHAGE) 1000 MG tablet, Take 1,000 mg by mouth 2 (two) times daily.  , Disp: , Rfl: ;  metoprolol (TOPROL-XL) 100 MG 24 hr tablet, Take 100 mg by mouth daily.  , Disp: , Rfl: ;  Multiple Vitamin (MULTIVITAMIN) capsule, Take 1 capsule by mouth daily.  , Disp: , Rfl:  warfarin (COUMADIN) 5 MG tablet, Take 5 mg by mouth daily.  , Disp: , Rfl: ;  diclofenac sodium (VOLTAREN) 1 % GEL, Apply 1 application topically every 6 (six) hours.  , Disp: , Rfl: ;  pregabalin (LYRICA) 50 MG capsule, Take 50 mg by mouth at bedtime.  , Disp: , Rfl:   Past Medical History  Diagnosis Date  . Spinal stenosis 01/20/05  . CAD (coronary artery disease)   . Hyperlipidemia   . Hypertension   . Arthritis 12/2005    left hand  . Neuropathy     with pain  . Diabetes mellitus     Past Surgical History  Procedure Date  . Back surgery   . Coronary angioplasty with stent placement   . Cataract extraction 08/11/09    right    Family History  Problem Relation Age of Onset    . Heart failure Father   . Heart failure Mother     History   Social History  . Marital Status: Divorced    Spouse Name: N/A    Number of Children: N/A  . Years of Education: N/A   Occupational History  . Not on file.   Social History Main Topics  . Smoking status: Never Smoker   . Smokeless tobacco: Not on file  . Alcohol Use: No  . Drug Use: No  . Sexually Active:    Other Topics Concern  . Not on file   Social History Narrative   Volunteers at [employer]   Review of Systems NO fever Slight nausea Recovering from rotator cuff surgery on right shoulder    Objective:   Physical Exam  Constitutional: He appears well-developed and well-nourished. No distress.  Musculoskeletal:       Tense edema in right calf---bruising with yellowish green and some purplish discoloration throughtout calf Homans negative  Slight fluctuance and tenderness at tibial tuberosity though not clearly pus or infected  Skin:       Calf slightly warm but not striking Mild tenderness along lateral calf  Assessment & Plan:

## 2011-02-20 NOTE — Patient Instructions (Signed)
Please use warm compresses over the swollen part at top of calf If increased redness or warmth, or pus comes from that area, please start the antibiotic

## 2011-02-26 NOTE — Op Note (Signed)
NAME:  Marvin Foster, Marvin Foster               ACCOUNT NO.:  [id number]   MEDICAL RECORD NO.:  [id number]          PATIENT TYPE:  OIB   LOCATION:  3172                         FACILITY:  [facility]   PHYSICIAN:  Reinaldo Meeker, M.D. DATE OF BIRTH:  1934-09-08   DATE OF PROCEDURE:  04/15/2005  DATE OF DISCHARGE:                                 OPERATIVE REPORT   PREOPERATIVE DIAGNOSIS:  Spinal stenosis, L3-4, L4-5, bilateral.   POSTOPERATIVE DIAGNOSIS:  Spinal stenosis, L3-4, L4-5, bilateral.   PROCEDURE:  Bilateral L3-4, L4-5 decompressive laminectomy.   SURGEON:  Reinaldo Meeker, M.D.   ASSISTANT:  Donalee Citrin, M.D.   PROCEDURE IN DETAIL:  After being placed in the prone position, the  patient's back was prepped and draped in usual sterile fashion. __________  was taken prior to incision to identify the appropriate level. Midline  incision was made above the spinous processes of L3, L4, and L5. Using the  Bovie current, the incision was carried down to spinous processes.  Subperiosteal dissection was carried out bilaterally on the spinous  processes and lamina. Self-retaining retractor was placed for exposure. X-  rays showed approach at the appropriate level. Spinous process of L3, L4 and  L5 were removed. Starting at the patient's left side, ice pick drill was  used to perform generous laminotomy, removing the inferior one half of the  L3 lamina and the medial one third of the facet joint and the superior one  half of the L4 lamina. Drill was then used to remove the inferior half of  the L4 lamina and medial one third of the facet joint and the superior one  half of the L5 lamina. Residual bone and hypertrophic ligamentum flavum  removed in a piecemeal fashion. A similar decompression was then carried on  the patient's right side, once again removing the lamina of L3, L4, and L5  and the medial facet joint of L3-4 and L4-5. Once again, residual bone and  ligamentum flavum were removed in a  piecemeal fashion. At this time,  inspection was carried out in all directions without any evidence of  residual compression, and none could be identified. Large amounts of  irrigation were carried out and any bleeding controlled with bipolar  coagulation and Gelfoam. Due to some oozing from the incisional edges, a  Hemovac drain was left in the epidural space and brought out through a  separate stab wound incision. The wound was then irrigated once more and  closed in multiple layers of Vicryl on the muscle and fascia and  subcutaneous and subcuticular tissues, staples on the skin. A sterile  dressing was then applied, and the patient was extubated and taken to the  recovery room in stable condition.       ROK/MEDQ  D:  04/15/2005  T:  04/15/2005  Job:  [id number]

## 2011-02-26 NOTE — Discharge Summary (Signed)
NAME:  Marvin Foster, Marvin Foster               ACCOUNT NO.:  [id number]   MEDICAL RECORD NO.:  [id number]          PATIENT TYPE:  INP   LOCATION:  5743                         FACILITY:  [facility]   PHYSICIAN:  Corinna L. Lendell Caprice, MDDATE OF BIRTH:  July 01, 1934   DATE OF ADMISSION:  04/25/2005  DATE OF DISCHARGE:  05/03/2005                                 DISCHARGE SUMMARY   DISCHARGE DIAGNOSES:  1.  Bilateral pneumonia.  2.  Recent lumbar laminectomy.  3.  Paroxysmal atrial fibrillation.  4.  Hypertension.  5.  Anemia.  6.  New onset diabetes.  7.  Coronary artery disease with LAD stent.  8.  Hypokalemia.  9.  Diarrhea, C. difficile negative.  10. Tenia cruris.  11. Peripheral edema.   DISCHARGE MEDICATIONS:  1.  He is to stop Norvasc.  2.  Continue Toprol XL 200 mg p.o. b.i.d.  3.  Lipitor 20 mg p.o. q.h.s.  4.  Aspirin 325 mg daily.  5.  He will have IV Vancomycin until May 10, 2005 to complete a two week      course.  6.  Avelox 400 mg p.o. daily until gone to complete a two week course.  7.  Clindamycin 300 mg p.o. q.i.d. for two more days.  8.  Diltiazem CD180 mg p.o. daily.  9.  Metformin 500 mg p.o. b.i.d.  10. Amaryl 8 mg p.o. daily.  11. Ketoconazole cream to groin twice a day for two weeks.  12. Lasix 40 mg p.o. daily as needed for swelling.  13. KCl 40 mEq p.o. daily with Lasix.   ALLERGIES:  PENICILLIN.   ACTIVITY:  Ad lib.   FOLLOW UP:  To follow up with Dr. Gerlene Fee.  Follow-up with Dr. Leodis Sias  in 2-4 weeks.  He will need a repeat chest x-ray in 4-6 weeks and any change  in diabetic medications at that time.  He is to monitor his blood sugars  twice a day and bring values to Dr. Modesto Charon.   CONSULTATIONS:  1.  Dr. Amil Amen.  2.  Dr. Gerlene Fee.   PROCEDURE:  PICC line.   CONDITION ON DISCHARGE:  Stable.   PERTINENT LABORATORY DATA:  Blood cultures negative.  C. difficile toxin  negative.  Wound culture from the drainage of the lower back grew out few  group B  strep.  UA showed greater than 1000 glucose, 15 ketones, specific  gravity 1.044, negative nitrites, negative leukocyte esterase, negative  blood.  Cardiac enzymes negative x3.  TSH 1.070, ferritin 193, hemoglobin  A1C was 8.7.  Complete metabolic panel on admission was significant for a  glucose of 302, otherwise unremarkable.  His potassium dropped to a low of  3.1 and was repleted.  INR was 1.4, PTT was 38.  Erythrocyte sedimentation  rate 81.  Hemoccult of the stool was negative.  Admission white count was  13,000 with 79% neutrophils, 9% lymphocytes.  Hemoglobin was 11.9,  hematocrit 34.8.  At discharge his white count was 10.  His hemoglobin  dropped to a low of 9.7 and at discharge was stable at 10.  His  MCV was  about 90, platelet count normal.   SPECIAL STUDIES IN RADIOLOGY:  MRI of the lumbar spine showed heterogeneous  material in the operative bed including fluid and enhancing tissue with mass-  effect upon the thecal sac.  Possibly sterile postop changes, possibly CSF  leak and/or infection.  Chest x-ray on admission two views showed left lower  lobe pneumonia. Repeat chest x-ray showed right lower lobe pneumonia in  addition.  On May 02, 2005 chest x-ray showed a decrease in size of the  right-sided pneumonia.  EKG showed initially on admission normal sinus  rhythm but the patient went into atrial fibrillation and several EKG's were  done showing atrial fibrillation with rapid ventricular response of a rate  of about 130, age-indeterminate lateral infarct.   HISTORY AND HOSPITAL COURSE:  Mr. Bonanno is a pleasant 77 year old white  male patient of Dr. Leodis Sias who had undergone an L3-L5 laminectomy and  decompression for spinal stenosis on April 15, 2005.  He developed a cough and  had fevers to 102.  He also complained of a very large amount of drainage  from his wound soaking several bandages daily.  His blood pressure was  103/46, pulse 90, respiratory rate 18, oxygen  saturation 93%.  He had  rhonchi and a clean incision on the back.  Please see H&P for complete  details.  The patient was admitted initially to the medical-surgical floor  and started on Avelox.  The patient went into rapid atrial fibrillation and  had several episodes of hypoxia.  He was transferred to the step-down unit.  He ruled out for myocardial infarction by enzymes.  Dr. Amil Amen, the  patient's Cardiologist was consulted.  The patient was started on Cardizem  drip and subsequently oral Cardizem.  His oxygen administration was  increased.  His antibiotic spectrum was broadened to include Vancomycin and  subsequently Clindamycin. He was started on handheld nebulizers.  Dr.  Gerlene Fee was consulted for the drainage.  He soaked several ABD pads daily  while here.  The patient converted to normal sinus rhythm and it was felt  that the patient had this episode of atrial fibrillation due to his  pulmonary issues.  Dr. Gerlene Fee eventually put sutures into the draining back  wound which helped with the copious drainage.  At the time of discharge he  was draining a bit of serous fluid but it was tremendously decreased.  The  patient was very slow to improve with respect to his pneumonia.  He  continued to have cough, shortness of breath, hypoxia and repeat chest x-ray  showed developing right-sided pneumonia, therefore his antibiotics were  broadened.  He is allergic to penicillin. At the time of discharge the  patient had a normal oxygen saturation.  His cough and shortness of breath  had improved and he was ambulating and had normal vital signs.   Dr. Gerlene Fee had requested a two week total course of Vancomycin be given due  to the drainage from his wound.  I have set this up with home health.  He  had a PICC line placed and he will need 750 mg IV Vancomycin q.12h. to be  stopped on May 10, 2005 at which time the PICC line can also be stopped.  The patient was noted to be hyperglycemic and  had no previous history of  diabetes.  His blood sugars were monitored and were in fact consistently  elevated.  He also had a hemoglobin A1C of 8.8.  He was started on insulin  sliding scale and subsequently oral medications.  At the time of discharge  his blood sugars were ranging in the 80s to 170 range.  He received diabetic  teaching.   During his hospitalization he also developed some diarrhea.  C. difficile  was negative and this responded to Imodium.   He also had a lot of edema during his hospitalization which may be fluid-  related and he has been given a prescription for Lasix to take as needed.   His blood pressure remained stable throughout his hospitalization.   He also had groin rash which is felt to be tinea cruris and Ketoconazole was  started.  The patient developed some anemia but had heme-negative stools and a normal  ferritin.  I suspect this is related to his acute illness, frequent  phlebotomies, etc.  Total time on the day of discharge is 50 minutes.       CLS/MEDQ  D:  05/03/2005  T:  05/03/2005  Job:  [id number]   cc:   Reinaldo Meeker, M.D.  [address]  [place]  [place] [address]  Fax: [phone]   Francisca December, M.D.  [address]  [place]  [place] [address]  Fax: [phone]   Maryla Morrow. Modesto Charon, M.D.  [address]  [place]  [place] [address]  Fax: [phone]

## 2011-02-26 NOTE — Op Note (Signed)
NAME:  DAVARIS, YOUTSEY               ACCOUNT NO.:  [id number]   MEDICAL RECORD NO.:  [id number]          PATIENT TYPE:  OIB   LOCATION:  3028                         FACILITY:  [facility]   PHYSICIAN:  Reinaldo Meeker, M.D. DATE OF BIRTH:  [birth year]   DATE OF PROCEDURE:  05/13/2005  DATE OF DISCHARGE:                                 OPERATIVE REPORT   PREOPERATIVE DIAGNOSIS:  Non-healing lumbar wound.   POSTOPERATIVE DIAGNOSIS:  Superficial lumbar infection.   PROCEDURE:  Incision and drainage of lumbar wound.   SURGEON:  Reinaldo Meeker, M.D.   PROCEDURE IN DETAIL:  After being placed in the prone position, the  patient's back was prepped and draped in the usual sterile fashion.  The  previous lumbar incision was incised in its inferior 2/3.  A self-retaining  retractor was placed for exposure.  Obvious pocket of purulent material was  identified.  This was cultured and then evacuated thoroughly.  The wound was  opened slightly superior until the pocket had been completely exposed.  All  necrotic looking material was suctioned out.  The walls were scraped with a  periosteal to remove any loose granulation tissue.  1 liter of antibiotic  irrigation was used with the Pulsavac followed by 500 mL of saline and 500  mL more of Bacitracin antibiotic irrigation.  At this time, there is no  evidence of further infectious pocket.  The fascia was intact and it did not  appear that the infection tracked deep and, therefore, it was not opened at  this time.  A Jackson-Pratt drain was then left in the wound and brought out  through a separate stab wound incision.  The wound was then closed with two  layers of Vicryl followed by staples and Dermabond on the skin.  A sterile  dressing was then applied.  The patient was extubated and taken to the  recovery room in stable condition.       ROK/MEDQ  D:  05/13/2005  T:  05/13/2005  Job:  [id number]

## 2011-02-26 NOTE — Cardiovascular Report (Signed)
NAME:  Marvin Foster, Marvin Foster NO.:  [id number]   MEDICAL RECORD NO.:  [id number]          PATIENT TYPE:  OIB   LOCATION:  6526                         FACILITY:  [facility]   PHYSICIAN:  Francisca December, M.D.  DATE OF BIRTH:  01/20/1934   DATE OF PROCEDURE:  03/01/2005  DATE OF DISCHARGE:                              CARDIAC CATHETERIZATION   PROCEDURE PERFORMED:  Percutaneous coronary intervention/stent implantation  to mid left anterior descending.   INDICATIONS:  Marvin Foster is a 77 year old man with known ASCVD,  nonobstructive, who recently underwent a myocardial perfusion study for  preoperative risk assessment. It was significant for the presence of  coronary ischemia. Diagnostic coronary angiography has revealed a 75-80% mid-  LAD stenosis which has progressed since the late 1990s from 40-50%. He is  brought to catheterization laboratory at this time to allow for percutaneous  revascularization.   PROCEDURAL NOTE:  The patient is brought to cardiac catheterization  laboratory in fasting state. The right groin was prepped and draped in usual  sterile fashion. Local anesthesia was obtained with infiltration of 1%  lidocaine. A 6-French catheter sheath was inserted percutaneously into the  right femoral artery utilizing an anterior approach over a guiding J-wire.  The patient then received a 0.75 mg/kg bolus of bivalirudin followed by 1.75  mg/kg per hour constant infusion. The resultant ACT was 349 seconds. A 6-  French 3.5 CLS guiding catheter was advanced through the descending aorta  where the left coronary os was engaged. A 0.014 inch Luge intracoronary  guidewire was passed across the lesion of the proximal to mid LAD without  difficulty. The lesion was primarily stented using a 3.5/12 mm Guidant  Vision intracoronary stent. This device was carefully placed into position  and inflated to a peak pressure of 12 atmospheres for approximately one  minute. The stent  balloon was deflated removed and exchanged for a 3.5/8 mm  Sci-Med Quantum Maverick intracoronary balloon. This was carefully placed  within the stented segment and inflated to peak pressure of 16 atmospheres  for approximately one-half minute. Following these maneuvers, the guidewire  was removed. Adequate patency in orthogonal views was confirmed by repeat  angiography. The guiding catheter was then removed. A right femoral  arteriogram in the 45 degrees RAO angulation was performed via the catheter  sheath by hand injection. It documented adequate anatomy for placement of  the percutaneous closure device Angio-Seal. This was successfully deployed  with good hemostasis and an intact distal pulse. The patient was then  transported to recovery area in stable condition.   ANGIOGRAPHY:  As mentioned, the lesion treated was in the anatomic  midportion of the LAD, although it was only a centimeter or so from its  ostium. There was a moderate size diagonal branch arising proximal to the  lesion. Following balloon dilatation and stent implantation, there is no  visible residual stenosis.   FINAL IMPRESSION:  1.  Atherosclerotic cardiovascular disease, single vessel.  2.  Status post successful PCI/bare metal stent implantation in proximal      LAD.  3.  Typical angina was  reproduced with device insertion and balloon      inflation.   PLAN:  The patient will need lifelong aspirin. Plavix will be administered  for one month. After this period of time, aspirin can temporarily be  discontinued to allow for the patient's surgical procedure.      JHE/MEDQ  D:  03/01/2005  T:  03/01/2005  Job:  [id number]   cc:   Maryla Morrow. Modesto Charon, M.D.  [address]  [place]  [place] [address]  Fax: [phone]   Reinaldo Meeker, M.D.  [address]  [place]  [place] [address]  Fax: [phone]

## 2011-02-26 NOTE — H&P (Signed)
NAME:  MEYER, ARORA NO.:  [id number]   MEDICAL RECORD NO.:  [id number]          PATIENT TYPE:  INP   LOCATION:  1827                         FACILITY:  [facility]   PHYSICIAN:  Sherin Quarry, MD      DATE OF BIRTH:  [birth year]   DATE OF ADMISSION:  04/25/2005  DATE OF DISCHARGE:                                HISTORY & PHYSICAL   Marvin Foster is a 77 year old man who underwent an L3 through 5 surgical  laminectomy and a decompression for spinal stenosis on April 15, 2005. He was  in the hospital over night and apparently did well. According to family  members, on Wednesday he began to have a persistent dry cough. On Thursday  the cough seemed to increase and was productive of yellowish phlegm. On  Friday he developed a fever with temperature up to 102 degrees and anorexia.  There were no chills or sweats. Today the family observed him to be somewhat  short of breath and therefore brought him to the emergency room. Also over  the last several days he has noted increased thirst and urinary frequency.  He states that he has a frontal headache. He denies chest pain or abdominal  pain. There has been no other gastrointestinal complaints. On arrival to the  emergency room a chest x-ray was obtained which revealed a left lower lobe  infiltrate. It was also noted that his blood sugar was 302. We will  therefore admit the patient for management of pneumonia and what appears to  be new onset of diabetes.   PAST MEDICAL HISTORY:   MEDICATIONS:  1.  Norvasc 5 mg daily.  2.  Lipitor 20 mg daily.  3.  Toprol XL 200 mg daily.  4.  Aspirin 325 mg daily.  5.  Multivitamin one daily.  6.  The patient was on Plavix 75 mg daily until recently but this has been      discontinued.   ALLERGIES:  PENICILLIN AND NEOSPORIN.   OPERATIONS:  The patient has had two stents, one was done in 2001, the  second was 6-weeks ago preoperatively after the patient apparently had a  positive  Cardiolite stress-test. He has been seen by Dr. Amil Amen after this  procedure and is thought to be doing very well. The family cannot recall him  having any other operations.   MEDICAL ILLNESSES:  1.  Hypertension. The patient has a long-standing history of hypertension      said to be well regulated. It is noteworthy that the patient has no      known history of diabetes.  2.  Coronary artery disease. The patient has had no recent history of chest      pain, orthopnea or paroxysmal nocturnal dyspnea.   FAMILY HISTORY:  Significant in that the patient has several half-brothers  and sisters all of whom are deceased. Apparently they did not have any heart  problems. His mother and father both died of heart disease in their early  33's.   SOCIAL HISTORY:  He does not smoke. He will only very occasionally drink  alcohol. He has not drunk any alcohol in the last 4 months.   REVIEW OF SYSTEMS:  HEAD:  He reports a frontal headache. EAR/NOSE/THROAT:  Denies earache, sinus pain or sore throat. CHEST:  See above.  CARDIOVASCULAR: See above. GI: He reports nausea, denies abdominal pain,  vomiting, hematemesis, or melena. GU: He denies dysuria or urinary  frequency. NEURO:  There is no history of seizure or stroke. ENDO: See  above.   PHYSICAL EXAMINATION:  The blood pressure is 103/46, pulse is 90,  respirations are 18 and unlabored, O2 saturations 93%.  HEENT EXAM:  Within normal limits.  CHEST:  Remarkable for a few rhonchi and diminished breath sounds at the  left base.  CARDIOVASCULAR:  Normal S1, S2 without murmurs or gallops.  ABDOMEN:  Benign, normal bowel sounds without masses, tenderness or  organomegaly.  NEUROLOGIC TESTING:  Within normal limits.  EXTREMITIES:  No evidence of cyanosis or edema.  EXAMINATION OF THE BACK: The incision appears to be clean. There is no  obvious drainage, there is no obvious redness or swelling. The lower part of  the incision gaps slightly but  there does not seem to be any bleeding or  drainage.   IMPRESSION:  1.  Apparent left lower lobe pneumonia.  2.  Diabetes, apparently new onset.  3.  Coronary artery disease status post stent x2.  4.  Hypertension.  5.  Hyperlipidemia.  6.  Penicillin allergy.  7.  Family history of heart disease.   PLAN:  Will admit the patient for treatment. He appears to be somewhat  volume depleted so will administer IV fluids. Will obtain blood cultures x2  and start Avalox 400 mg IV daily. A sliding scale Insulin regimen will be  instituted. Will follow his oxygenation closely. Dr.  Gerlene Fee will be informed of the patient's admission and will follow the  patient regarding his postoperative course.       SY/MEDQ  D:  04/25/2005  T:  04/25/2005  Job:  [id number]   cc:   Thelma Barge P. Modesto Charon, M.D.  [address]  [place]  [place] [address]  Fax: [phone]   Reinaldo Meeker, M.D.  [address]  [place]  [place] [address]  Fax: [phone]   Francisca December, M.D.  [address]  [place]  [place] [address]  Fax: [phone]

## 2011-03-26 ENCOUNTER — Ambulatory Visit (INDEPENDENT_AMBULATORY_CARE_PROVIDER_SITE_OTHER): Payer: PRIVATE HEALTH INSURANCE | Admitting: Family Medicine

## 2011-03-26 ENCOUNTER — Encounter: Payer: Self-pay | Admitting: *Deleted

## 2011-03-26 VITALS — BP 120/62 | Temp 99.0°F | Wt 244.6 lb

## 2011-03-26 DIAGNOSIS — E119 Type 2 diabetes mellitus without complications: Secondary | ICD-10-CM

## 2011-03-26 LAB — BASIC METABOLIC PANEL
BUN: 20 mg/dL (ref 6–23)
CO2: 26 mEq/L (ref 19–32)
Calcium: 9.2 mg/dL (ref 8.4–10.5)
Chloride: 105 mEq/L (ref 96–112)
Creatinine, Ser: 1.1 mg/dL (ref 0.4–1.5)
GFR: 69.3 mL/min (ref >60.00)
Glucose, Bld: 168 mg/dL — ABNORMAL HIGH (ref 70–99)
Potassium: 4.3 mEq/L (ref 3.5–5.1)
Sodium: 139 mEq/L (ref 135–145)

## 2011-03-26 LAB — HEMOGLOBIN A1C: Hgb A1c MFr Bld: 7.1 % — ABNORMAL HIGH (ref 4.6–6.5)

## 2011-03-26 NOTE — Assessment & Plan Note (Signed)
Pt's glucose has been slightly higher since surgery.  Check A1C.  Adjust meds prn.

## 2011-03-26 NOTE — Progress Notes (Signed)
  Subjective:    Patient ID: Marvin Foster, male    DOB: May 15, 1934, 77 y.o.   MRN: [id number]  HPI DM-  Chronic problem for pt.  Immediately after surgery sugars were elevated in the 150s, now they are in the 110-120s.  Walking regularly, watching diet.  On Metformin.  No symptomatic lows, denies dizziness/shakiness.  No CP, SOB, HAs, visual changes, edema.   Review of Systems For ROS see HPI     Objective:   Physical Exam  Constitutional: He is oriented to person, place, and time. He appears well-developed and well-nourished. No distress.  HENT:  Head: Normocephalic and atraumatic.  Eyes: Conjunctivae and EOM are normal. Pupils are equal, round, and reactive to light.  Neck: Normal range of motion. Neck supple. No thyromegaly present.  Cardiovascular: Normal rate, regular rhythm and intact distal pulses.   Pulmonary/Chest: Effort normal and breath sounds normal. No respiratory distress.  Abdominal: Soft. Bowel sounds are normal. He exhibits no distension.  Musculoskeletal: He exhibits no edema.  Lymphadenopathy:    He has no cervical adenopathy.  Neurological: He is alert and oriented to person, place, and time. No cranial nerve deficit.  Skin: Skin is warm and dry.  Psychiatric: He has a normal mood and affect. His behavior is normal.          Assessment & Plan:

## 2011-03-26 NOTE — Patient Instructions (Signed)
Follow up in 3 months to recheck cholesterol and diabetes- do not eat before this appt We'll notify you of your lab results Try and get regular exercise and continue to make healthy food choices Have a great summer!!

## 2011-06-10 ENCOUNTER — Encounter: Payer: Self-pay | Admitting: Family Medicine

## 2011-06-23 ENCOUNTER — Ambulatory Visit (INDEPENDENT_AMBULATORY_CARE_PROVIDER_SITE_OTHER): Payer: PRIVATE HEALTH INSURANCE | Admitting: Internal Medicine

## 2011-06-23 ENCOUNTER — Encounter: Payer: Self-pay | Admitting: Internal Medicine

## 2011-06-23 DIAGNOSIS — G609 Hereditary and idiopathic neuropathy, unspecified: Secondary | ICD-10-CM

## 2011-06-23 DIAGNOSIS — M79676 Pain in unspecified toe(s): Secondary | ICD-10-CM

## 2011-06-23 DIAGNOSIS — E1149 Type 2 diabetes mellitus with other diabetic neurological complication: Secondary | ICD-10-CM

## 2011-06-23 MED ORDER — MUPIROCIN 2 % EX OINT: TOPICAL_OINTMENT | CUTANEOUS | Status: AC

## 2011-06-23 NOTE — Assessment & Plan Note (Signed)
Has a dried blister and a fifth right toe, I think that's the cause of the pain, no evidence of cellulitis. He does have mild peripheral neuropathy. Plan: See instructions. Patient aware of the diagnosis of mild peripheral neuropathy.

## 2011-06-23 NOTE — Patient Instructions (Signed)
epson salt ! OTC lamisil cream between toes twice a day x 2 weeks Prescribed mupirocin to be used in the 5th R toe twice a day x few days Call if redness, discharge , swelling Will refer you to a podiatrist

## 2011-06-23 NOTE — Progress Notes (Signed)
  Subjective:    Patient ID: Marvin Foster, male    DOB: 1934-02-26, 77 y.o.   MRN: [id number]  HPI 2 weeks history of pain at the right fifth toe.  PMH Pertinent past medical history includes diabetes, hypertension  \Review of Systems The patient is diabetic, denies any redness, discharge or injury in that area    Objective:   Physical Exam  Constitutional: He appears well-developed and well-nourished.  HENT:  Head: Normocephalic and atraumatic.  Musculoskeletal:       DIABETIC FEET EXAM: No lower extremity edema Normal pedal pulses bilaterally Skin w/ few  calluses Pinprick examination of the feet w/ some numbness in the 2-3-4 toes (R)   Skin:       Mild maceration between the toes. At the inner aspect of the fifth right toe there is a 3 mm induration consistent with dried blisters. No swelling, fluctuance, redness.           Assessment & Plan:

## 2011-06-25 ENCOUNTER — Ambulatory Visit: Payer: PRIVATE HEALTH INSURANCE | Admitting: Family Medicine

## 2011-06-28 ENCOUNTER — Encounter: Payer: Self-pay | Admitting: Family Medicine

## 2011-06-28 ENCOUNTER — Ambulatory Visit (INDEPENDENT_AMBULATORY_CARE_PROVIDER_SITE_OTHER): Payer: PRIVATE HEALTH INSURANCE | Admitting: Family Medicine

## 2011-06-28 DIAGNOSIS — M79609 Pain in unspecified limb: Secondary | ICD-10-CM

## 2011-06-28 DIAGNOSIS — E785 Hyperlipidemia, unspecified: Secondary | ICD-10-CM

## 2011-06-28 DIAGNOSIS — E119 Type 2 diabetes mellitus without complications: Secondary | ICD-10-CM

## 2011-06-28 DIAGNOSIS — M79676 Pain in unspecified toe(s): Secondary | ICD-10-CM

## 2011-06-28 NOTE — Progress Notes (Signed)
  Subjective:    Patient ID: Marvin Foster, male    DOB: 12-Feb-1934, 77 y.o.   MRN: [id number]  HPI R toe pain- saw Dr Drue Novel for this issue previously and was started on antifungals.  Area remains painful.  No drainage.  Starting to have corn formation on top of toe.  DM- chronic problem, CBGs running 90s-120s.  No symptomatic lows, dizziness, shaky, CP, SOB, HAs, visual changes, edema.  Hyperlipidemia- chronic problem for pt, on Lipitor.  Due for labs.  No abd pain, N/V, myalgias   Review of Systems For ROS see HPI     Objective:   Physical Exam  Vitals reviewed. Constitutional: He is oriented to person, place, and time. He appears well-developed and well-nourished. No distress.       overwt  HENT:  Head: Normocephalic and atraumatic.  Eyes: Conjunctivae and EOM are normal. Pupils are equal, round, and reactive to light.  Neck: Normal range of motion. Neck supple. No thyromegaly present.  Cardiovascular: Normal rate, normal heart sounds and intact distal pulses.   No murmur heard. Pulmonary/Chest: Effort normal and breath sounds normal. No respiratory distress.  Abdominal: Soft. Bowel sounds are normal. He exhibits no distension.  Musculoskeletal: He exhibits no edema.  Lymphadenopathy:    He has no cervical adenopathy.  Neurological: He is alert and oriented to person, place, and time. No cranial nerve deficit.  Skin: Skin is warm and dry.       R 5th toe w/ corn formation along medial nail edge w/ evidence of possible deep tissue injury  Psychiatric: He has a normal mood and affect. His behavior is normal.          Assessment & Plan:

## 2011-06-28 NOTE — Patient Instructions (Signed)
Follow up in 3 months to recheck diabetes- you can eat before this We'll call you with your podiatry appt Keep up the good work on healthy food choices Call with any questions or concerns Hang in there!!!

## 2011-06-29 LAB — BASIC METABOLIC PANEL
BUN: 15 mg/dL (ref 6–23)
CO2: 27 mEq/L (ref 19–32)
Calcium: 9.1 mg/dL (ref 8.4–10.5)
Chloride: 107 mEq/L (ref 96–112)
Creatinine, Ser: 1.1 mg/dL (ref 0.4–1.5)
GFR: 73.07 mL/min (ref >60.00)
Glucose, Bld: 134 mg/dL — ABNORMAL HIGH (ref 70–99)
Potassium: 4.8 mEq/L (ref 3.5–5.1)
Sodium: 142 mEq/L (ref 135–145)

## 2011-06-29 LAB — LIPID PANEL
Cholesterol: 105 mg/dL (ref 0–200)
HDL: 34.7 mg/dL — ABNORMAL LOW (ref >39.00)
LDL Cholesterol: 44 mg/dL (ref 0–99)
Total CHOL/HDL Ratio: 3
Triglycerides: 132 mg/dL (ref 0.0–149.0)
VLDL: 26.4 mg/dL (ref 0.0–40.0)

## 2011-06-29 LAB — HEPATIC FUNCTION PANEL
ALT: 37 U/L (ref 0–53)
AST: 45 U/L — ABNORMAL HIGH (ref 0–37)
Albumin: 4 g/dL (ref 3.5–5.2)
Alkaline Phosphatase: 46 U/L (ref 39–117)
Bilirubin, Direct: 0.2 mg/dL (ref 0.0–0.3)
Total Bilirubin: 0.8 mg/dL (ref 0.3–1.2)
Total Protein: 7 g/dL (ref 6.0–8.3)

## 2011-06-29 LAB — HEMOGLOBIN A1C: Hgb A1c MFr Bld: 6.7 % — ABNORMAL HIGH (ref 4.6–6.5)

## 2011-06-29 NOTE — Assessment & Plan Note (Signed)
Chronic problem for pt, typically well controlled.  Tolerating statin w/out difficulty.  Check labs.  Adjust meds prn.

## 2011-06-29 NOTE — Assessment & Plan Note (Signed)
Chronic problem, stable.  Asymptomatic.  Tolerating meds w/out difficulty.  Check labs.  Adjust meds prn.

## 2011-06-29 NOTE — Assessment & Plan Note (Signed)
Have concerns about possible deep tissue injury under corn formation on pt's R toe.  Refer to podiatry for evaluation and tx.

## 2011-06-30 NOTE — Progress Notes (Signed)
Quick Note:    Labs mailed.  ______

## 2011-07-13 ENCOUNTER — Telehealth: Payer: Self-pay | Admitting: Family Medicine

## 2011-07-13 NOTE — Telephone Encounter (Signed)
error 

## 2011-07-15 ENCOUNTER — Telehealth: Payer: Self-pay

## 2011-07-15 NOTE — Telephone Encounter (Signed)
Form mailed to [facility]

## 2011-08-12 ENCOUNTER — Other Ambulatory Visit: Payer: Self-pay | Admitting: Family Medicine

## 2011-08-13 MED ORDER — GLUCOSE BLOOD VI STRP: ORAL_STRIP | Status: DC

## 2011-08-13 NOTE — Telephone Encounter (Signed)
rx sent to pharmacy by e-script For accucheck strips

## 2011-08-16 MED ORDER — GLUCOSE BLOOD VI STRP: 1.0000 | ORAL_STRIP | Freq: Two times a day (BID) | Status: DC

## 2011-08-16 NOTE — Telephone Encounter (Signed)
Added diagnosis code and test times for rx.I sent the Rx to the pharmacy. Via e script

## 2011-08-16 NOTE — Telephone Encounter (Signed)
Addended by: Derry Lory A on: 08/16/2011 08:55 AM   Modules accepted: Orders

## 2011-08-17 ENCOUNTER — Other Ambulatory Visit: Payer: Self-pay | Admitting: Family Medicine

## 2011-08-17 MED ORDER — GLUCOSE BLOOD VI STRP: ORAL_STRIP | Status: AC

## 2011-08-17 NOTE — Telephone Encounter (Signed)
Called [facility] to verify the AVIVA strips are the correct strips to send. I was advised by pharmacist the AVIVA is the correct strips to send so I sent the AVIVA strips via e script. Called pt to advise of the correction. Spoke to pt and apolijized for inconvience. Pt understood

## 2011-09-27 ENCOUNTER — Ambulatory Visit (INDEPENDENT_AMBULATORY_CARE_PROVIDER_SITE_OTHER): Payer: Self-pay | Admitting: Family Medicine

## 2011-09-27 ENCOUNTER — Encounter: Payer: Self-pay | Admitting: Family Medicine

## 2011-09-27 VITALS — BP 130/70 | HR 62 | Temp 98.1°F | Ht 68.75 in | Wt 243.2 lb

## 2011-09-27 DIAGNOSIS — E119 Type 2 diabetes mellitus without complications: Secondary | ICD-10-CM

## 2011-09-27 LAB — BASIC METABOLIC PANEL
BUN: 21 mg/dL (ref 6–23)
CO2: 26 mEq/L (ref 19–32)
Calcium: 9.4 mg/dL (ref 8.4–10.5)
Chloride: 106 mEq/L (ref 96–112)
Creatinine, Ser: 1.1 mg/dL (ref 0.4–1.5)
GFR: 68.49 mL/min (ref >60.00)
Glucose, Bld: 167 mg/dL — ABNORMAL HIGH (ref 70–99)
Potassium: 4.4 mEq/L (ref 3.5–5.1)
Sodium: 142 mEq/L (ref 135–145)

## 2011-09-27 LAB — HEMOGLOBIN A1C: Hgb A1c MFr Bld: 6.7 % — ABNORMAL HIGH (ref 4.6–6.5)

## 2011-09-27 NOTE — Progress Notes (Signed)
  Subjective:    Patient ID: Marvin Foster, male    DOB: [birth year], 77 y.o.   MRN: [id number]  HPI DM- chronic problem, has recently seen podiatry.  Got new diabetic shoes.  Fasting CBG 123 this AM.  Typically in the low 100s, 'rarely over 130'.  Denies symptomatic lows.  No CP, SOB, HAs, visual changes, edema.   Review of Systems For ROS see HPI     Objective:   Physical Exam  Vitals reviewed. Constitutional: He is oriented to person, place, and time. He appears well-developed and well-nourished. No distress.  HENT:  Head: Normocephalic and atraumatic.  Eyes: Conjunctivae and EOM are normal. Pupils are equal, round, and reactive to light.  Neck: Normal range of motion. Neck supple. No thyromegaly present.  Cardiovascular: Normal rate, regular rhythm, normal heart sounds and intact distal pulses.   No murmur heard. Pulmonary/Chest: Effort normal and breath sounds normal. No respiratory distress.  Abdominal: Soft. Bowel sounds are normal. He exhibits no distension.  Musculoskeletal: He exhibits no edema.  Lymphadenopathy:    He has no cervical adenopathy.  Neurological: He is alert and oriented to person, place, and time. No cranial nerve deficit.  Skin: Skin is warm and dry.  Psychiatric: He has a normal mood and affect. His behavior is normal.          Assessment & Plan:

## 2011-09-27 NOTE — Patient Instructions (Signed)
Schedule your complete physical in 3 months- don't eat before this We'll notify you of your lab results and make any changes if needed Call with any questions or concerns Happy Holidays!!!

## 2011-09-29 ENCOUNTER — Encounter: Payer: Self-pay | Admitting: *Deleted

## 2011-10-03 NOTE — Assessment & Plan Note (Signed)
Chronic problem for pt.  Typically fairly well controlled.  Check labs.  Adjust meds prn.

## 2011-10-07 ENCOUNTER — Telehealth: Payer: Self-pay | Admitting: *Deleted

## 2011-10-07 ENCOUNTER — Other Ambulatory Visit: Payer: Self-pay | Admitting: *Deleted

## 2011-10-07 MED ORDER — METFORMIN HCL 1000 MG PO TABS: 1000.0000 mg | ORAL_TABLET | Freq: Two times a day (BID) | ORAL | Status: DC

## 2011-10-07 NOTE — Telephone Encounter (Signed)
Pt requested that he wants a 3 month supply sent for metformin, received verbal order from MD Tabori, to give pt #180 per pt is known to come in for regular visits, called [facility] with verbal order per sent a previous escribe of #60, noted change in chart of call made to pharmacy per pharmacist

## 2011-10-07 NOTE — Telephone Encounter (Signed)
rx sent to pharmacy by e-script For metformin #60 with 3 refills [facility] to [facility] on [address] per pt left vm to advise we had reeceived a fax noting to send rx, noted no fax in office, left vm for pt to call back to advise the rx has been sent

## 2011-10-12 HISTORY — PX: OTHER SURGICAL HISTORY: SHX169

## 2011-11-03 ENCOUNTER — Telehealth: Payer: Self-pay | Admitting: Family Medicine

## 2011-11-03 NOTE — Telephone Encounter (Signed)
As long as eye doctor is ok w/ preceding w/ surgery prior to physical, I don't see any problem with it.

## 2011-11-03 NOTE — Telephone Encounter (Signed)
Discuss with patient  

## 2011-12-07 ENCOUNTER — Telehealth: Payer: Self-pay | Admitting: Family Medicine

## 2011-12-07 NOTE — Telephone Encounter (Signed)
Patient called & would like to get copies of most recent labs. Is it ok for me to print out & call him to pick up? Please advise & Thanks Judeth Cornfield

## 2011-12-07 NOTE — Telephone Encounter (Signed)
Called pt to let him know that I have his printed labs from December OV at the front desk for pick up pt understood per needs for eye center appt

## 2011-12-21 ENCOUNTER — Telehealth: Payer: Self-pay | Admitting: Family Medicine

## 2011-12-21 NOTE — Telephone Encounter (Signed)
Refill: Metformin 1000mg  tab. Take 1 tablet by mouth twice daily. Qty 180. Last fill 10-07-11

## 2011-12-22 MED ORDER — METFORMIN HCL 1000 MG PO TABS: 1000.0000 mg | ORAL_TABLET | Freq: Two times a day (BID) | ORAL | Status: DC

## 2011-12-22 NOTE — Telephone Encounter (Signed)
rx sent to pharmacy by e-script  

## 2011-12-27 ENCOUNTER — Encounter: Payer: Self-pay | Admitting: Family Medicine

## 2012-01-03 ENCOUNTER — Encounter: Payer: Self-pay | Admitting: Family Medicine

## 2012-02-28 ENCOUNTER — Encounter: Payer: Self-pay | Admitting: Family Medicine

## 2012-02-28 ENCOUNTER — Ambulatory Visit (INDEPENDENT_AMBULATORY_CARE_PROVIDER_SITE_OTHER): Payer: PRIVATE HEALTH INSURANCE | Admitting: Family Medicine

## 2012-02-28 DIAGNOSIS — D239 Other benign neoplasm of skin, unspecified: Secondary | ICD-10-CM

## 2012-02-28 DIAGNOSIS — E119 Type 2 diabetes mellitus without complications: Secondary | ICD-10-CM

## 2012-02-28 DIAGNOSIS — N5089 Other specified disorders of the male genital organs: Secondary | ICD-10-CM

## 2012-02-28 DIAGNOSIS — I10 Essential (primary) hypertension: Secondary | ICD-10-CM

## 2012-02-28 DIAGNOSIS — N508 Other specified disorders of male genital organs: Secondary | ICD-10-CM

## 2012-02-28 DIAGNOSIS — Z0000 Encounter for general adult medical examination without abnormal findings: Secondary | ICD-10-CM | POA: Insufficient documentation

## 2012-02-28 DIAGNOSIS — E785 Hyperlipidemia, unspecified: Secondary | ICD-10-CM

## 2012-02-28 LAB — HEPATIC FUNCTION PANEL
ALT: 37 U/L (ref 0–53)
AST: 44 U/L — ABNORMAL HIGH (ref 0–37)
Albumin: 3.9 g/dL (ref 3.5–5.2)
Alkaline Phosphatase: 45 U/L (ref 39–117)
Bilirubin, Direct: 0.2 mg/dL (ref 0.0–0.3)
Total Bilirubin: 0.8 mg/dL (ref 0.3–1.2)
Total Protein: 6.9 g/dL (ref 6.0–8.3)

## 2012-02-28 LAB — BASIC METABOLIC PANEL
BUN: 21 mg/dL (ref 6–23)
CO2: 26 mEq/L (ref 19–32)
Calcium: 9.3 mg/dL (ref 8.4–10.5)
Chloride: 104 mEq/L (ref 96–112)
Creatinine, Ser: 1.1 mg/dL (ref 0.4–1.5)
GFR: 69.13 mL/min (ref >60.00)
Glucose, Bld: 106 mg/dL — ABNORMAL HIGH (ref 70–99)
Potassium: 4.5 mEq/L (ref 3.5–5.1)
Sodium: 139 mEq/L (ref 135–145)

## 2012-02-28 LAB — LIPID PANEL
Cholesterol: 115 mg/dL (ref 0–200)
HDL: 32.1 mg/dL — ABNORMAL LOW (ref >39.00)
LDL Cholesterol: 47 mg/dL (ref 0–99)
Total CHOL/HDL Ratio: 4
Triglycerides: 180 mg/dL — ABNORMAL HIGH (ref 0.0–149.0)
VLDL: 36 mg/dL (ref 0.0–40.0)

## 2012-02-28 LAB — CBC WITH DIFFERENTIAL/PLATELET
Basophils Absolute: 0 10*3/uL (ref 0.0–0.1)
Basophils Relative: 0.8 % (ref 0.0–3.0)
Eosinophils Absolute: 0.3 10*3/uL (ref 0.0–0.7)
Eosinophils Relative: 4.7 % (ref 0.0–5.0)
HCT: 37.8 % — ABNORMAL LOW (ref 39.0–52.0)
Hemoglobin: 12.6 g/dL — ABNORMAL LOW (ref 13.0–17.0)
Lymphocytes Relative: 34.1 % (ref 12.0–46.0)
Lymphs Abs: 2 10*3/uL (ref 0.7–4.0)
MCHC: 33.4 g/dL (ref 30.0–36.0)
MCV: 93.5 fl (ref 78.0–100.0)
Monocytes Absolute: 0.5 10*3/uL (ref 0.1–1.0)
Monocytes Relative: 8.1 % (ref 3.0–12.0)
Neutro Abs: 3 10*3/uL (ref 1.4–7.7)
Neutrophils Relative %: 52.3 % (ref 43.0–77.0)
Platelets: 138 10*3/uL — ABNORMAL LOW (ref 150.0–400.0)
RBC: 4.04 Mil/uL — ABNORMAL LOW (ref 4.22–5.81)
RDW: 14.3 % (ref 11.5–14.6)
WBC: 5.7 10*3/uL (ref 4.5–10.5)

## 2012-02-28 LAB — HEMOGLOBIN A1C: Hgb A1c MFr Bld: 6.6 % — ABNORMAL HIGH (ref 4.6–6.5)

## 2012-02-28 NOTE — Progress Notes (Signed)
  Subjective:    Patient ID: Marvin Foster, male    DOB: Dec 22, 1933, 78 y.o.   MRN: [id number]  HPI Here today for CPE.  Risk Factors: DM- chronic problem, fasting CBGs ranging 110-130s.  On Metformin twice daily.  Denies symptomatic lows.  No CP, SOB, HAs, edema, numbness/tingling.  UTD on eye exam- s/p corneal transplant at [facility] Hyperlipidemia- chronic problem, on Lipitor.  Denies abd pain, N/V, myalgias. HTN-  Chronic problem, well controlled on Toprol, Dilt, Enalapril, Taztia.  Following w/ Dr Chales Abrahams Deboraha Sprang) Physical Activity: no formal exercise but very active at work Fall Risk: low risk Depression: no current sxs Hearing: normal to conversational tones, decreased to whispered voice at 6 ft ADL's: independent Cognitive: normal linear thought process, memory and attention intact Home Safety: feels safe at home. Height, Weight, BMI, Visual Acuity: see vitals, vision corrected to 20/20 w/ glasses Counseling: UTD on colonoscopy, eye exam. Labs Ordered: See A&P Care Plan: See A&P    Review of Systems Patient reports no vision/hearing changes, anorexia, fever ,adenopathy, persistant/recurrent hoarseness, swallowing issues, chest pain, palpitations, edema, persistant/recurrent cough, hemoptysis, dyspnea (rest,exertional, paroxysmal nocturnal), gastrointestinal  bleeding (melena, rectal bleeding), abdominal pain, excessive heart burn, GU symptoms (dysuria, hematuria, voiding/incontinence issues) syncope, focal weakness, memory loss, numbness & tingling, skin/hair/nail changes, depression, anxiety, abnormal bruising/bleeding, musculoskeletal symptoms/signs.     Objective:   Physical Exam BP 124/78  Pulse 77  Temp(Src) 98.4 F (36.9 C) (Oral)  Ht 5' 8.5" (1.74 m)  Wt 240 lb 12.8 oz (109.226 kg)  BMI 36.08 kg/m2  SpO2 96%  General Appearance:    Alert, cooperative, no distress, appears stated age  Head:    Normocephalic, without obvious abnormality, atraumatic  Eyes:    PERRL,  conjunctiva/corneas clear, EOM's intact, fundi    benign, both eyes       Ears:    Normal TM's and external ear canals, both ears  Nose:   Nares normal, septum midline, mucosa normal, no drainage   or sinus tenderness  Throat:   Lips, mucosa, and tongue normal; teeth and gums normal  Neck:   Supple, symmetrical, trachea midline, no adenopathy;       thyroid:  No enlargement/tenderness/nodules  Back:     Symmetric, no curvature, ROM normal, no CVA tenderness  Lungs:     Clear to auscultation bilaterally, respirations unlabored  Chest wall:    No tenderness or deformity  Heart:    Regular rate and rhythm, S1 and S2 normal, no murmur, rub   or gallop  Abdomen:     Soft, non-tender, bowel sounds active all four quadrants,    no masses, no organomegaly  Genitalia:    Normal male without lesion, discharge or tenderness.  R testicular mass- no tenderness  Rectal:    Normal tone, normal prostate, no masses or tenderness;   guaiac negative stool  Extremities:   Extremities normal, atraumatic, no cyanosis or edema  Pulses:   2+ and symmetric all extremities  Skin:   Skin color, texture, turgor normal, no rashes or lesions  Lymph nodes:   Cervical, supraclavicular, and axillary nodes normal  Neurologic:   CNII-XII intact. Normal strength, sensation and reflexes      throughout          Assessment & Plan:

## 2012-02-28 NOTE — Patient Instructions (Signed)
Follow up in 3 months to recheck diabetes We'll notify you of your lab results We'll call you with your Derm appt Call with any questions or concerns Hang in there!!!

## 2012-02-29 LAB — TSH: TSH: 2.63 u[IU]/mL (ref 0.35–5.50)

## 2012-02-29 LAB — PSA: PSA: 1.36 ng/mL (ref 0.10–4.00)

## 2012-03-02 ENCOUNTER — Encounter: Payer: Self-pay | Admitting: *Deleted

## 2012-03-07 ENCOUNTER — Other Ambulatory Visit: Payer: Self-pay | Admitting: Family Medicine

## 2012-03-07 ENCOUNTER — Telehealth: Payer: Self-pay | Admitting: *Deleted

## 2012-03-07 ENCOUNTER — Ambulatory Visit (HOSPITAL_BASED_OUTPATIENT_CLINIC_OR_DEPARTMENT_OTHER)
Admission: RE | Admit: 2012-03-07 | Discharge: 2012-03-07 | Disposition: A | Discharge status: Home / Self Care | Payer: No Typology Code available for payment source | Source: Ambulatory Visit | Attending: Family Medicine | Admitting: Family Medicine

## 2012-03-07 DIAGNOSIS — N5089 Other specified disorders of the male genital organs: Secondary | ICD-10-CM

## 2012-03-07 DIAGNOSIS — N508 Other specified disorders of male genital organs: Secondary | ICD-10-CM | POA: Insufficient documentation

## 2012-03-07 HISTORY — DX: Other specified disorders of the male genital organs: N50.89

## 2012-03-07 NOTE — Assessment & Plan Note (Signed)
Pt's PE WNL w/ exception of new scrotal mass, obesity.  UTD on health maintenance.  Check labs.  Anticipatory guidance provided.

## 2012-03-07 NOTE — Assessment & Plan Note (Signed)
Refer to derm  ?

## 2012-03-07 NOTE — Assessment & Plan Note (Signed)
Chronic problem.  Typically well controlled.  Asymptomatic.  UTD on eye exam.  Check labs.  Adjust meds prn  

## 2012-03-07 NOTE — Assessment & Plan Note (Signed)
New.  Get US to assess. 

## 2012-03-07 NOTE — Assessment & Plan Note (Signed)
Chronic problem.  Tolerating meds w/out difficulty.  Check labs.  Adjust meds prn  

## 2012-03-07 NOTE — Assessment & Plan Note (Signed)
Chronic problem.  Well controlled.  Asymptomatic.  Also seeing cards.  No changes.

## 2012-03-07 NOTE — Telephone Encounter (Signed)
Call from [facility] cardiology requesting copy of recent labs. Spoke with Pt who indicated that it is ok to fax labs over to his card office so that they will not repeat labs. Labs faxed Pt aware.

## 2012-03-08 ENCOUNTER — Other Ambulatory Visit (HOSPITAL_BASED_OUTPATIENT_CLINIC_OR_DEPARTMENT_OTHER): Payer: PRIVATE HEALTH INSURANCE

## 2012-03-09 ENCOUNTER — Encounter: Payer: Self-pay | Admitting: *Deleted

## 2012-03-30 ENCOUNTER — Other Ambulatory Visit: Payer: Self-pay | Admitting: Family Medicine

## 2012-03-30 NOTE — Telephone Encounter (Signed)
Refill Metformin 1000MG  Tab #180, take one tablet by mouth twice daily, last fill 03.13.13 last ov 5.20.13

## 2012-03-31 MED ORDER — METFORMIN HCL 1000 MG PO TABS: 1000.0000 mg | ORAL_TABLET | Freq: Two times a day (BID) | ORAL | Status: DC

## 2012-03-31 NOTE — Telephone Encounter (Signed)
rx sent to pharmacy by e-script  

## 2012-04-05 ENCOUNTER — Telehealth: Payer: Self-pay

## 2012-04-05 MED ORDER — ACCU-CHEK FASTCLIX LANCETS MISC: 1.0000 | Freq: Every day | Status: DC

## 2012-04-05 NOTE — Telephone Encounter (Signed)
Patient called and he stated that he needed a refill on his lancets, we were unclear on what he needed so I called the pharmacy and Morrie Sheldon made me aware to send the accu-chek fast clix. I made patient aware and he said he was not sure if this was correct but he will see once he gets to the pharmacy.    KP

## 2012-05-29 DIAGNOSIS — Z947 Corneal transplant status: Secondary | ICD-10-CM

## 2012-05-29 DIAGNOSIS — H18609 Keratoconus, unspecified, unspecified eye: Secondary | ICD-10-CM | POA: Insufficient documentation

## 2012-05-29 HISTORY — DX: Keratoconus, unspecified, unspecified eye: H18.609

## 2012-05-29 HISTORY — DX: Corneal transplant status: Z94.7

## 2012-05-31 ENCOUNTER — Ambulatory Visit (INDEPENDENT_AMBULATORY_CARE_PROVIDER_SITE_OTHER): Payer: PRIVATE HEALTH INSURANCE | Admitting: Family Medicine

## 2012-05-31 ENCOUNTER — Encounter: Payer: Self-pay | Admitting: Family Medicine

## 2012-05-31 VITALS — BP 100/56 | HR 74 | Wt 242.0 lb

## 2012-05-31 DIAGNOSIS — I10 Essential (primary) hypertension: Secondary | ICD-10-CM

## 2012-05-31 DIAGNOSIS — E119 Type 2 diabetes mellitus without complications: Secondary | ICD-10-CM

## 2012-05-31 LAB — BASIC METABOLIC PANEL
BUN: 18 mg/dL (ref 6–23)
CO2: 28 mEq/L (ref 19–32)
Calcium: 9.4 mg/dL (ref 8.4–10.5)
Chloride: 103 mEq/L (ref 96–112)
Creatinine, Ser: 1.1 mg/dL (ref 0.4–1.5)
GFR: 68.37 mL/min (ref >60.00)
Glucose, Bld: 119 mg/dL — ABNORMAL HIGH (ref 70–99)
Potassium: 4.1 mEq/L (ref 3.5–5.1)
Sodium: 141 mEq/L (ref 135–145)

## 2012-05-31 LAB — HEMOGLOBIN A1C: Hgb A1c MFr Bld: 6.5 % (ref 4.6–6.5)

## 2012-05-31 NOTE — Progress Notes (Signed)
  Subjective:    Patient ID: Marvin Foster, male    DOB: 24-Jun-1934, 78 y.o.   MRN: [id number]  HPI DM- chronic problem, UTD on eye exam (saw Dr Elmer Picker last week- has appt upcoming w/ diabetic retina specialist).  CBGs running 110-130s.  No symptomatic lows.  Denies CP, SOB, HAs, visual changes.  No numbness of hands or feet.  HTN- pt's BP low today but currently asymptomatic.  Denies dizziness, SOB, weakness.   Review of Systems For ROS see HPI     Objective:   Physical Exam  Constitutional: He is oriented to person, place, and time. He appears well-developed and well-nourished. No distress.  HENT:  Head: Normocephalic and atraumatic.  Eyes: Conjunctivae and EOM are normal. Pupils are equal, round, and reactive to light.  Neck: Normal range of motion. Neck supple. No thyromegaly present.  Cardiovascular: Normal rate, regular rhythm, normal heart sounds and intact distal pulses.   No murmur heard. Pulmonary/Chest: Effort normal and breath sounds normal. No respiratory distress.  Abdominal: Soft. Bowel sounds are normal. He exhibits no distension.  Musculoskeletal: He exhibits no edema.  Lymphadenopathy:    He has no cervical adenopathy.  Neurological: He is alert and oriented to person, place, and time. No cranial nerve deficit.  Skin: Skin is warm and dry.  Psychiatric: He has a normal mood and affect. His behavior is normal.          Assessment & Plan:

## 2012-05-31 NOTE — Patient Instructions (Addendum)
Follow up in 3 months to recheck diabetes and cholesterol If you start to have dizziness w/ position changes or other concerns- call here or Dr Candis Schatz look great!  Keep up the good work! Call with any questions or concerns Enjoy what's left of summer!!!

## 2012-05-31 NOTE — Assessment & Plan Note (Signed)
Chronic problem.  CBGs well controlled by pt's log.  UTD on eye exam.  Denies sxs.  Check labs.  Adjust meds prn

## 2012-05-31 NOTE — Assessment & Plan Note (Signed)
Chronic problem but today is hypotensive.  Asymptomatic.  Discussed sxs that should prompt call to this office or Dr Amil Amen so that meds can be adjust prn.  Pt expressed understanding and is in agreement w/ plan.

## 2012-06-01 ENCOUNTER — Encounter (INDEPENDENT_AMBULATORY_CARE_PROVIDER_SITE_OTHER): Payer: PRIVATE HEALTH INSURANCE | Admitting: Ophthalmology

## 2012-06-01 DIAGNOSIS — I10 Essential (primary) hypertension: Secondary | ICD-10-CM

## 2012-06-01 DIAGNOSIS — E1139 Type 2 diabetes mellitus with other diabetic ophthalmic complication: Secondary | ICD-10-CM

## 2012-06-01 DIAGNOSIS — H35039 Hypertensive retinopathy, unspecified eye: Secondary | ICD-10-CM

## 2012-06-01 DIAGNOSIS — H3560 Retinal hemorrhage, unspecified eye: Secondary | ICD-10-CM

## 2012-06-01 DIAGNOSIS — E11319 Type 2 diabetes mellitus with unspecified diabetic retinopathy without macular edema: Secondary | ICD-10-CM

## 2012-06-01 DIAGNOSIS — H43399 Other vitreous opacities, unspecified eye: Secondary | ICD-10-CM

## 2012-06-01 DIAGNOSIS — E1165 Type 2 diabetes mellitus with hyperglycemia: Secondary | ICD-10-CM

## 2012-06-02 ENCOUNTER — Encounter: Payer: Self-pay | Admitting: *Deleted

## 2012-07-04 ENCOUNTER — Telehealth: Payer: Self-pay | Admitting: Family Medicine

## 2012-07-04 MED ORDER — METFORMIN HCL 1000 MG PO TABS: 1000.0000 mg | ORAL_TABLET | Freq: Two times a day (BID) | ORAL | Status: DC

## 2012-07-04 NOTE — Telephone Encounter (Signed)
Refill: Metformin 1000mg  tab. Take one tablet by mouth twice daily. Qty 180. Last fill 03-31-12

## 2012-07-04 NOTE — Telephone Encounter (Signed)
Refill sent in

## 2012-07-13 ENCOUNTER — Encounter (INDEPENDENT_AMBULATORY_CARE_PROVIDER_SITE_OTHER): Payer: PRIVATE HEALTH INSURANCE | Admitting: Ophthalmology

## 2012-07-13 DIAGNOSIS — H43819 Vitreous degeneration, unspecified eye: Secondary | ICD-10-CM

## 2012-07-13 DIAGNOSIS — E1139 Type 2 diabetes mellitus with other diabetic ophthalmic complication: Secondary | ICD-10-CM

## 2012-07-13 DIAGNOSIS — I10 Essential (primary) hypertension: Secondary | ICD-10-CM

## 2012-07-13 DIAGNOSIS — E11319 Type 2 diabetes mellitus with unspecified diabetic retinopathy without macular edema: Secondary | ICD-10-CM

## 2012-07-13 DIAGNOSIS — H35039 Hypertensive retinopathy, unspecified eye: Secondary | ICD-10-CM

## 2012-07-13 DIAGNOSIS — H3560 Retinal hemorrhage, unspecified eye: Secondary | ICD-10-CM

## 2012-07-13 DIAGNOSIS — E1165 Type 2 diabetes mellitus with hyperglycemia: Secondary | ICD-10-CM

## 2012-08-24 ENCOUNTER — Encounter (INDEPENDENT_AMBULATORY_CARE_PROVIDER_SITE_OTHER): Payer: PRIVATE HEALTH INSURANCE | Admitting: Ophthalmology

## 2012-08-24 DIAGNOSIS — H3530 Unspecified macular degeneration: Secondary | ICD-10-CM

## 2012-08-24 DIAGNOSIS — H3560 Retinal hemorrhage, unspecified eye: Secondary | ICD-10-CM

## 2012-08-24 DIAGNOSIS — H43819 Vitreous degeneration, unspecified eye: Secondary | ICD-10-CM

## 2012-08-24 DIAGNOSIS — H35039 Hypertensive retinopathy, unspecified eye: Secondary | ICD-10-CM

## 2012-08-24 DIAGNOSIS — E11319 Type 2 diabetes mellitus with unspecified diabetic retinopathy without macular edema: Secondary | ICD-10-CM

## 2012-08-24 DIAGNOSIS — I10 Essential (primary) hypertension: Secondary | ICD-10-CM

## 2012-08-24 DIAGNOSIS — E1139 Type 2 diabetes mellitus with other diabetic ophthalmic complication: Secondary | ICD-10-CM

## 2012-08-31 ENCOUNTER — Encounter: Payer: Self-pay | Admitting: Family Medicine

## 2012-08-31 ENCOUNTER — Ambulatory Visit (INDEPENDENT_AMBULATORY_CARE_PROVIDER_SITE_OTHER): Payer: PRIVATE HEALTH INSURANCE | Admitting: Family Medicine

## 2012-08-31 VITALS — BP 130/72 | HR 63 | Ht 69.0 in | Wt 243.0 lb

## 2012-08-31 DIAGNOSIS — E785 Hyperlipidemia, unspecified: Secondary | ICD-10-CM

## 2012-08-31 DIAGNOSIS — E119 Type 2 diabetes mellitus without complications: Secondary | ICD-10-CM

## 2012-08-31 LAB — BASIC METABOLIC PANEL
BUN: 18 mg/dL (ref 6–23)
CO2: 29 mEq/L (ref 19–32)
Calcium: 9.3 mg/dL (ref 8.4–10.5)
Chloride: 104 mEq/L (ref 96–112)
Creatinine, Ser: 1 mg/dL (ref 0.4–1.5)
GFR: 77.96 mL/min (ref >60.00)
Glucose, Bld: 129 mg/dL — ABNORMAL HIGH (ref 70–99)
Potassium: 4.7 mEq/L (ref 3.5–5.1)
Sodium: 140 mEq/L (ref 135–145)

## 2012-08-31 LAB — HEMOGLOBIN A1C: Hgb A1c MFr Bld: 6.8 % — ABNORMAL HIGH (ref 4.6–6.5)

## 2012-08-31 LAB — HEPATIC FUNCTION PANEL
ALT: 45 U/L (ref 0–53)
AST: 53 U/L — ABNORMAL HIGH (ref 0–37)
Albumin: 4 g/dL (ref 3.5–5.2)
Alkaline Phosphatase: 45 U/L (ref 39–117)
Bilirubin, Direct: 0.2 mg/dL (ref 0.0–0.3)
Total Bilirubin: 0.9 mg/dL (ref 0.3–1.2)
Total Protein: 7.5 g/dL (ref 6.0–8.3)

## 2012-08-31 LAB — LIPID PANEL
Cholesterol: 104 mg/dL (ref 0–200)
HDL: 32.4 mg/dL — ABNORMAL LOW (ref >39.00)
LDL Cholesterol: 47 mg/dL (ref 0–99)
Total CHOL/HDL Ratio: 3
Triglycerides: 125 mg/dL (ref 0.0–149.0)
VLDL: 25 mg/dL (ref 0.0–40.0)

## 2012-08-31 NOTE — Assessment & Plan Note (Signed)
Chronic problem.  Typically well controlled.  UTD on eye exam.  Asymptomatic.  Check labs.  Adjust meds prn  

## 2012-08-31 NOTE — Progress Notes (Signed)
  Subjective:    Patient ID: Marvin Foster, male    DOB: 25-Jul-1934, 78 y.o.   MRN: [id number]  HPI DM- chronic problem, typically well controlled.  On Metformin.  CBGs ranging 110-130.  UTD on eye exam.  Denies CP, SOB, HAs, visual changes, edema, numbness/tingling  Hyperlipidemia- chronic problem, on Lipitor daily.  Denies abd pain, N/V, myalgias.  Pt fears formulary changes.   Review of Systems For ROS see HPI     Objective:   Physical Exam  Vitals reviewed. Constitutional: He is oriented to person, place, and time. He appears well-developed and well-nourished. No distress.  HENT:  Head: Normocephalic and atraumatic.  Eyes: Conjunctivae normal and EOM are normal. Pupils are equal, round, and reactive to light.  Neck: Normal range of motion. Neck supple. No thyromegaly present.  Cardiovascular: Normal rate, regular rhythm, normal heart sounds and intact distal pulses.   No murmur heard. Pulmonary/Chest: Effort normal and breath sounds normal. No respiratory distress.  Abdominal: Soft. Bowel sounds are normal. He exhibits no distension.  Musculoskeletal: He exhibits no edema.  Lymphadenopathy:    He has no cervical adenopathy.  Neurological: He is alert and oriented to person, place, and time. No cranial nerve deficit.  Skin: Skin is warm and dry.  Psychiatric: He has a normal mood and affect. His behavior is normal.          Assessment & Plan:

## 2012-08-31 NOTE — Patient Instructions (Addendum)
Follow up in 3 months to recheck diabetes You look great!!  Keep up the good work! We'll notify you of your lab results and make any changes if needed Call with any questions or concerns Happy Holidays!!!

## 2012-08-31 NOTE — Assessment & Plan Note (Signed)
Chronic problem.  Tolerating meds w/out difficulty.  Asymptomatic.  Check labs.  Adjust meds prn  

## 2012-09-11 ENCOUNTER — Other Ambulatory Visit: Payer: Self-pay | Admitting: Family Medicine

## 2012-09-11 MED ORDER — GLUCOSE BLOOD VI STRP: 1.0000 | ORAL_STRIP | Freq: Two times a day (BID) | Status: DC

## 2012-09-11 NOTE — Telephone Encounter (Signed)
Rx sent 

## 2012-09-11 NOTE — Telephone Encounter (Signed)
refill ACCU-CHEK AVIVA TES #100 Use as directed last fill 08.22.13, last ov wt/labs 11.21.13 follow DM

## 2012-09-14 ENCOUNTER — Other Ambulatory Visit: Payer: Self-pay | Admitting: *Deleted

## 2012-09-14 ENCOUNTER — Telehealth: Payer: Self-pay | Admitting: *Deleted

## 2012-09-14 MED ORDER — GLUCOSE BLOOD VI STRP: ORAL_STRIP | Status: DC

## 2012-09-14 MED ORDER — GLUCOSE BLOOD VI STRP: 1.0000 | ORAL_STRIP | Freq: Two times a day (BID) | Status: DC

## 2012-09-14 NOTE — Telephone Encounter (Signed)
Pharmacy called back stating that Pt has avia strips not active plus. Rx sent for correct strips.

## 2012-09-14 NOTE — Telephone Encounter (Signed)
Rx sent 

## 2012-09-14 NOTE — Telephone Encounter (Signed)
Message copied by Verdene Rio on Thu Sep 14, 2012  8:49 AM ------      Message from: Baldwin [place]      Created: Thu Sep 14, 2012  8:30 AM      Regarding: Test Strips       Patient called and states he uses the Accu check active plus test strips and needs some called in. He is completely out and the ones called in previously were the wrong ones.

## 2012-09-14 NOTE — Addendum Note (Signed)
Addended by: Candie Echevaria L on: 09/14/2012 09:15 AM   Modules accepted: Orders

## 2012-10-02 ENCOUNTER — Telehealth: Payer: Self-pay | Admitting: Family Medicine

## 2012-10-02 DIAGNOSIS — E119 Type 2 diabetes mellitus without complications: Secondary | ICD-10-CM

## 2012-10-02 MED ORDER — METFORMIN HCL 1000 MG PO TABS: 1000.0000 mg | ORAL_TABLET | Freq: Two times a day (BID) | ORAL | Status: DC

## 2012-10-02 NOTE — Telephone Encounter (Signed)
refill  GLUCOPHAGE 1000 MG Take 1 tablet (1,000 mg total) by mouth 2 (two) times daily.  #180 last fill 9.24.13

## 2012-10-02 NOTE — Telephone Encounter (Signed)
Refill for glucophage sent to pharmacy

## 2012-10-09 ENCOUNTER — Encounter: Payer: Self-pay | Admitting: Family Medicine

## 2012-10-09 ENCOUNTER — Ambulatory Visit (INDEPENDENT_AMBULATORY_CARE_PROVIDER_SITE_OTHER): Payer: PRIVATE HEALTH INSURANCE | Admitting: Family Medicine

## 2012-10-09 VITALS — BP 120/50 | HR 76 | Temp 98.3°F | Ht 69.0 in | Wt 244.8 lb

## 2012-10-09 DIAGNOSIS — J209 Acute bronchitis, unspecified: Secondary | ICD-10-CM | POA: Insufficient documentation

## 2012-10-09 MED ORDER — AZITHROMYCIN 250 MG PO TABS: ORAL_TABLET | ORAL | Status: DC

## 2012-10-09 MED ORDER — GUAIFENESIN-CODEINE 100-10 MG/5ML PO SYRP: 10.0000 mL | ORAL_SOLUTION | Freq: Three times a day (TID) | ORAL | Status: DC | PRN

## 2012-10-09 NOTE — Assessment & Plan Note (Signed)
New.  Start abx.  Cough meds prn.  Reviewed supportive care and red flags that should prompt return.  Pt expressed understanding and is in agreement w/ plan.  

## 2012-10-09 NOTE — Progress Notes (Signed)
  Subjective:    Patient ID: Marvin Foster, male    DOB: [birth year], 78 y.o.   MRN: [id number]  HPI URI- sxs started 12/24 w/ runny nose, cough.  Has progressively worsened but denies HA, chest tightness.  No facial pain.  No ear pain.  Cough is productive- clear sputum.  Was having bloody nasal drainage but this resolved.  + sick contacts.  No N/V/D.   Review of Systems For ROS see HPI     Objective:   Physical Exam  Vitals reviewed. Constitutional: He appears well-developed and well-nourished. No distress.  HENT:  Head: Normocephalic and atraumatic.  Right Ear: Tympanic membrane normal.  Left Ear: Tympanic membrane normal.  Nose: No mucosal edema or rhinorrhea. Right sinus exhibits no maxillary sinus tenderness and no frontal sinus tenderness. Left sinus exhibits no maxillary sinus tenderness and no frontal sinus tenderness.  Mouth/Throat: Mucous membranes are normal. No oropharyngeal exudate, posterior oropharyngeal edema or posterior oropharyngeal erythema.  Eyes: Conjunctivae normal and EOM are normal. Pupils are equal, round, and reactive to light.  Neck: Normal range of motion. Neck supple.  Cardiovascular: Normal rate, regular rhythm and normal heart sounds.   Pulmonary/Chest: Effort normal and breath sounds normal. No respiratory distress. He has no wheezes.       + hacking cough  Lymphadenopathy:    He has no cervical adenopathy.  Skin: Skin is warm and dry.          Assessment & Plan:

## 2012-10-09 NOTE — Patient Instructions (Addendum)
This is a bronchitis Start the Zpack as directed Use the cough syrup as needed- this may make you drowsy REST! Drink plenty of fluids Hang in there! Call with any questions or concerns Happy New Year!!!

## 2012-11-30 ENCOUNTER — Ambulatory Visit (INDEPENDENT_AMBULATORY_CARE_PROVIDER_SITE_OTHER): Payer: PRIVATE HEALTH INSURANCE | Admitting: Family Medicine

## 2012-11-30 ENCOUNTER — Encounter: Payer: Self-pay | Admitting: Family Medicine

## 2012-11-30 VITALS — BP 110/60 | HR 64 | Temp 98.1°F | Ht 69.0 in | Wt 244.6 lb

## 2012-11-30 DIAGNOSIS — E119 Type 2 diabetes mellitus without complications: Secondary | ICD-10-CM

## 2012-11-30 LAB — BASIC METABOLIC PANEL
BUN: 15 mg/dL (ref 6–23)
CO2: 28 mEq/L (ref 19–32)
Calcium: 9 mg/dL (ref 8.4–10.5)
Chloride: 105 mEq/L (ref 96–112)
Creatinine, Ser: 1 mg/dL (ref 0.4–1.5)
GFR: 74.43 mL/min (ref >60.00)
Glucose, Bld: 119 mg/dL — ABNORMAL HIGH (ref 70–99)
Potassium: 4.5 mEq/L (ref 3.5–5.1)
Sodium: 140 mEq/L (ref 135–145)

## 2012-11-30 LAB — HEMOGLOBIN A1C: Hgb A1c MFr Bld: 6.8 % — ABNORMAL HIGH (ref 4.6–6.5)

## 2012-11-30 NOTE — Patient Instructions (Addendum)
Schedule your complete physical for May Keep up the good work!  You look great! We'll notify you of your lab results and make any changes if needed Happy Early Birthday!!!

## 2012-11-30 NOTE — Progress Notes (Signed)
  Subjective:    Patient ID: Marvin Foster, male    DOB: [birth year], 79 y.o.   MRN: [id number]  HPI DM- chronic problem, on Metformin.  CBGs 'good'.  UTD on eye exam.  Denies symptomatic lows.  On enalapril for kidney protection.  No CP, SOB, HAs, visual changes, edema, numbness/tingling.   Review of Systems For ROS see HPI     Objective:   Physical Exam  Vitals reviewed. Constitutional: He is oriented to person, place, and time. He appears well-developed and well-nourished. No distress.  HENT:  Head: Normocephalic and atraumatic.  Eyes: Conjunctivae and EOM are normal. Pupils are equal, round, and reactive to light.  Neck: Normal range of motion. Neck supple. No thyromegaly present.  Cardiovascular: Normal rate, regular rhythm, normal heart sounds and intact distal pulses.   No murmur heard. Pulmonary/Chest: Effort normal and breath sounds normal. No respiratory distress.  Abdominal: Soft. Bowel sounds are normal. He exhibits no distension.  Musculoskeletal: He exhibits no edema.  Lymphadenopathy:    He has no cervical adenopathy.  Neurological: He is alert and oriented to person, place, and time. No cranial nerve deficit.  Skin: Skin is warm and dry.  Psychiatric: He has a normal mood and affect. His behavior is normal.          Assessment & Plan:

## 2012-11-30 NOTE — Assessment & Plan Note (Signed)
Chronic problem.  Asymptomatic.  UTD on eye exam.  On ACE.  Check labs.  Adjust meds prn

## 2012-12-01 ENCOUNTER — Encounter: Payer: Self-pay | Admitting: *Deleted

## 2012-12-29 ENCOUNTER — Other Ambulatory Visit: Payer: Self-pay | Admitting: Family Medicine

## 2013-01-01 ENCOUNTER — Telehealth: Payer: Self-pay | Admitting: Family Medicine

## 2013-01-01 NOTE — Telephone Encounter (Signed)
Rx sent, see other encounter--  KP

## 2013-01-01 NOTE — Telephone Encounter (Signed)
REFILL ON GLUCOPHAGE TAB  #180  SIG: TAKE ONE TABLET BY MOUTH TWICE DAILY LAST FILLED: 12.23.2013

## 2013-03-01 ENCOUNTER — Encounter: Payer: Self-pay | Admitting: Family Medicine

## 2013-03-01 ENCOUNTER — Ambulatory Visit (INDEPENDENT_AMBULATORY_CARE_PROVIDER_SITE_OTHER): Payer: PRIVATE HEALTH INSURANCE | Admitting: Family Medicine

## 2013-03-01 VITALS — BP 112/74 | HR 87 | Temp 98.1°F | Ht 69.0 in | Wt 247.4 lb

## 2013-03-01 DIAGNOSIS — Z0000 Encounter for general adult medical examination without abnormal findings: Secondary | ICD-10-CM

## 2013-03-01 DIAGNOSIS — I10 Essential (primary) hypertension: Secondary | ICD-10-CM

## 2013-03-01 DIAGNOSIS — E119 Type 2 diabetes mellitus without complications: Secondary | ICD-10-CM

## 2013-03-01 DIAGNOSIS — E785 Hyperlipidemia, unspecified: Secondary | ICD-10-CM

## 2013-03-01 LAB — CBC WITH DIFFERENTIAL/PLATELET
Basophils Absolute: 0 10*3/uL (ref 0.0–0.1)
Basophils Relative: 0.8 % (ref 0.0–3.0)
Eosinophils Absolute: 0.2 10*3/uL (ref 0.0–0.7)
Eosinophils Relative: 3.7 % (ref 0.0–5.0)
HCT: 38.4 % — ABNORMAL LOW (ref 39.0–52.0)
Hemoglobin: 13.1 g/dL (ref 13.0–17.0)
Lymphocytes Relative: 32.6 % (ref 12.0–46.0)
Lymphs Abs: 2 10*3/uL (ref 0.7–4.0)
MCHC: 34.1 g/dL (ref 30.0–36.0)
MCV: 91.6 fl (ref 78.0–100.0)
Monocytes Absolute: 0.6 10*3/uL (ref 0.1–1.0)
Monocytes Relative: 9.8 % (ref 3.0–12.0)
Neutro Abs: 3.2 10*3/uL (ref 1.4–7.7)
Neutrophils Relative %: 53.1 % (ref 43.0–77.0)
Platelets: 151 10*3/uL (ref 150.0–400.0)
RBC: 4.19 Mil/uL — ABNORMAL LOW (ref 4.22–5.81)
RDW: 14.9 % — ABNORMAL HIGH (ref 11.5–14.6)
WBC: 6.1 10*3/uL (ref 4.5–10.5)

## 2013-03-01 LAB — HEPATIC FUNCTION PANEL
ALT: 35 U/L (ref 0–53)
AST: 44 U/L — ABNORMAL HIGH (ref 0–37)
Albumin: 3.7 g/dL (ref 3.5–5.2)
Alkaline Phosphatase: 43 U/L (ref 39–117)
Bilirubin, Direct: 0.2 mg/dL (ref 0.0–0.3)
Total Bilirubin: 1 mg/dL (ref 0.3–1.2)
Total Protein: 6.7 g/dL (ref 6.0–8.3)

## 2013-03-01 LAB — BASIC METABOLIC PANEL
BUN: 17 mg/dL (ref 6–23)
CO2: 27 mEq/L (ref 19–32)
Calcium: 9.1 mg/dL (ref 8.4–10.5)
Chloride: 103 mEq/L (ref 96–112)
Creatinine, Ser: 1 mg/dL (ref 0.4–1.5)
GFR: 74.38 mL/min (ref >60.00)
Glucose, Bld: 135 mg/dL — ABNORMAL HIGH (ref 70–99)
Potassium: 4.7 mEq/L (ref 3.5–5.1)
Sodium: 138 mEq/L (ref 135–145)

## 2013-03-01 LAB — LIPID PANEL
Cholesterol: 105 mg/dL (ref 0–200)
HDL: 32.8 mg/dL — ABNORMAL LOW (ref >39.00)
LDL Cholesterol: 49 mg/dL (ref 0–99)
Total CHOL/HDL Ratio: 3
Triglycerides: 118 mg/dL (ref 0.0–149.0)
VLDL: 23.6 mg/dL (ref 0.0–40.0)

## 2013-03-01 LAB — PSA: PSA: 1.14 ng/mL (ref 0.10–4.00)

## 2013-03-01 LAB — HEMOGLOBIN A1C: Hgb A1c MFr Bld: 6.9 % — ABNORMAL HIGH (ref 4.6–6.5)

## 2013-03-01 LAB — TSH: TSH: 3.33 u[IU]/mL (ref 0.35–5.50)

## 2013-03-01 NOTE — Progress Notes (Signed)
  Subjective:    Patient ID: Marvin Foster, male    DOB: November 24, 1933, 79 y.o.   MRN: [id number]  HPI Here today for CPE.  Risk Factors: DM- chronic problem, CBGs running 90-130s.  UTD on eye exam.  On Metformin.  Enalapril for renal protection.  Denies symptomatic lows.  Denies N/V/D, weakness/numbness. Hyperlipidemia- chronic problem, on Lipitor.  Denies abd pain, N/V, myalgias. HTN- chronic problem, Dilt, Enalapril, and Metoprolol.  No CP, SOB, HAs, edema. Physical Activity: no formal activity but walking regularly Fall Risk: has fallen- 'i'm so clumsy' Depression: denies current Hearing: normal to conversational tones, decreased to whispered voice ADL's: independent Cognitive: normal linear thought process, memory and attention intact Home Safety: safe at home, lives w/ wife Height, Weight, BMI, Visual Acuity: see vitals, vision corrected to 20/20 w/ glasses Counseling: UTD on colonoscopy. Labs Ordered: See A&P Care Plan: See A&P    Review of Systems Patient reports no vision/hearing changes, anorexia, fever ,adenopathy, persistant/recurrent hoarseness, swallowing issues, chest pain, palpitations, edema, persistant/recurrent cough, hemoptysis, dyspnea (rest,exertional, paroxysmal nocturnal), gastrointestinal  bleeding (melena, rectal bleeding), abdominal pain, excessive heart burn, GU symptoms (dysuria, hematuria, voiding/incontinence issues) syncope, focal weakness, memory loss, numbness & tingling, skin/hair/nail changes, depression, anxiety, abnormal bruising/bleeding, musculoskeletal symptoms/signs.     Objective:   Physical Exam BP 112/74  Pulse 87  Temp(Src) 98.1 F (36.7 C) (Oral)  Ht 5\' 9"  (1.753 m)  Wt 247 lb 6.4 oz (112.22 kg)  BMI 36.52 kg/m2  SpO2 94%  General Appearance:    Alert, cooperative, no distress, appears stated age  Head:    Normocephalic, without obvious abnormality, atraumatic  Eyes:    PERRL, conjunctiva/corneas clear, EOM's intact both eyes        Ears:    Normal TM's and external ear canals, both ears  Nose:   Nares normal, septum midline, mucosa normal, no drainage   or sinus tenderness  Throat:   Lips, mucosa, and tongue normal; teeth and gums normal  Neck:   Supple, symmetrical, trachea midline, no adenopathy;       thyroid:  No enlargement/tenderness/nodules  Back:     Symmetric, no curvature, ROM normal, no CVA tenderness  Lungs:     Clear to auscultation bilaterally, respirations unlabored  Chest wall:    No tenderness or deformity  Heart:    Irregular S1/S2- following w/ cards  Abdomen:     Soft, non-tender, bowel sounds active all four quadrants,    no masses, no organomegaly  Genitalia:    Normal male without lesion, discharge or tenderness  Rectal:    Normal tone, normal prostate, no masses or tenderness;   guaiac negative stool  Extremities:   Extremities normal, atraumatic, no cyanosis or edema  Pulses:   2+ and symmetric all extremities  Skin:   Skin color, texture, turgor normal, no rashes or lesions  Lymph nodes:   Cervical, supraclavicular, and axillary nodes normal  Neurologic:   CNII-XII intact. Normal strength, sensation and reflexes      throughout          Assessment & Plan:

## 2013-03-01 NOTE — Patient Instructions (Addendum)
Follow up in 3-4 months to recheck DM We'll notify you of your lab results and make any changes if needed Keep up the good work!  You look great! Call with any questions or concerns Happy Memorial Day!

## 2013-03-02 ENCOUNTER — Encounter: Payer: Self-pay | Admitting: *Deleted

## 2013-03-06 NOTE — Assessment & Plan Note (Signed)
Chronic problem.  Tolerating statin w/out difficulty.  Check labs.  Adjust meds prn  

## 2013-03-06 NOTE — Assessment & Plan Note (Signed)
Chronic problem, adequate control.  Asymptomatic.  Reviewed his meds- pt w/ 2 doses of metoprolol and was taking both.  Clarified for pt that he should only be taking the 100mg  and not the 50mg  in addition.  Pt expressed understanding and is in agreement w/ plan.

## 2013-03-06 NOTE — Assessment & Plan Note (Signed)
Chronic problem.  Typically well controlled.  UTD on eye exam.  Asymptomatic.  Check labs.  Adjust meds prn  

## 2013-03-06 NOTE — Assessment & Plan Note (Signed)
Pt's PE WNL w/ exception of known abnormalities- abnormal S1/S2, obesity, bilateral bunions.  UTD on health maintenance.  Check labs.  Adjust meds prn

## 2013-06-07 ENCOUNTER — Ambulatory Visit (INDEPENDENT_AMBULATORY_CARE_PROVIDER_SITE_OTHER): Payer: PRIVATE HEALTH INSURANCE | Admitting: Family Medicine

## 2013-06-07 ENCOUNTER — Encounter: Payer: Self-pay | Admitting: Family Medicine

## 2013-06-07 VITALS — BP 118/78 | HR 60 | Temp 98.2°F | Ht 70.0 in | Wt 249.2 lb

## 2013-06-07 DIAGNOSIS — R29898 Other symptoms and signs involving the musculoskeletal system: Secondary | ICD-10-CM | POA: Insufficient documentation

## 2013-06-07 DIAGNOSIS — E119 Type 2 diabetes mellitus without complications: Secondary | ICD-10-CM

## 2013-06-07 LAB — HEMOGLOBIN A1C: Hgb A1c MFr Bld: 7.1 % — ABNORMAL HIGH (ref 4.6–6.5)

## 2013-06-07 LAB — BASIC METABOLIC PANEL
BUN: 18 mg/dL (ref 6–23)
CO2: 26 mEq/L (ref 19–32)
Calcium: 9.1 mg/dL (ref 8.4–10.5)
Chloride: 105 mEq/L (ref 96–112)
Creatinine, Ser: 1 mg/dL (ref 0.4–1.5)
GFR: 74.33 mL/min (ref >60.00)
Glucose, Bld: 132 mg/dL — ABNORMAL HIGH (ref 70–99)
Potassium: 4.8 mEq/L (ref 3.5–5.1)
Sodium: 139 mEq/L (ref 135–145)

## 2013-06-07 NOTE — Patient Instructions (Addendum)
Follow up in 3 months to recheck DM and cholesterol Keep up the good work!  You look great! We'll notify you of your lab results and make any changes if needed We'll call you with your physical therapy appt Call with any questions or concerns Happy Labor Day!!!

## 2013-06-07 NOTE — Progress Notes (Signed)
  Subjective:    Patient ID: Marvin Foster, male    DOB: 1933/11/07, 79 y.o.   MRN: [id number]  HPI DM- chronic problem, typically well controlled.  On Metformin.  On ACE for renal protection.  UTD on eye exam.  Denies symptomatic lows, no CP, SOB, HAs, visual changes, edema.  No numbness/tingling hands feet.  Bilateral leg weakness- pt reports this has been an ongoing issue for over 10 yrs.  Has a hard time rising from seated position, particularly if on floor or squatting.    Review of Systems For ROS see HPI     Objective:   Physical Exam  Vitals reviewed. Constitutional: He is oriented to person, place, and time. He appears well-developed and well-nourished. No distress.  HENT:  Head: Normocephalic and atraumatic.  Eyes: Conjunctivae and EOM are normal. Pupils are equal, round, and reactive to light.  Neck: Normal range of motion. Neck supple. No thyromegaly present.  Cardiovascular: Normal rate, regular rhythm, normal heart sounds and intact distal pulses.   No murmur heard. Pulmonary/Chest: Effort normal and breath sounds normal. No respiratory distress.  Abdominal: Soft. Bowel sounds are normal. He exhibits no distension.  Musculoskeletal: He exhibits no edema.  Lymphadenopathy:    He has no cervical adenopathy.  Neurological: He is alert and oriented to person, place, and time. No cranial nerve deficit.  Skin: Skin is warm and dry.  Psychiatric: He has a normal mood and affect. His behavior is normal.          Assessment & Plan:

## 2013-06-11 NOTE — Assessment & Plan Note (Addendum)
New to provider, chronic for pt.  We have never discussed this before.  Given pt's difficulty w/ rising from seated positions- particularly if on floor- will refer to PT for evaluation and tx.  Pt expressed understanding and is in agreement w/ plan.

## 2013-06-11 NOTE — Assessment & Plan Note (Signed)
Chronic problem.  Currently asymptomatic.  UTD on eye exam.  Check labs.  Adjust meds prn

## 2013-06-12 ENCOUNTER — Ambulatory Visit: Payer: No Typology Code available for payment source | Attending: Family Medicine | Admitting: Rehabilitation

## 2013-06-12 DIAGNOSIS — M6281 Muscle weakness (generalized): Secondary | ICD-10-CM | POA: Insufficient documentation

## 2013-06-12 DIAGNOSIS — IMO0001 Reserved for inherently not codable concepts without codable children: Secondary | ICD-10-CM | POA: Insufficient documentation

## 2013-06-13 ENCOUNTER — Ambulatory Visit: Payer: No Typology Code available for payment source | Admitting: Rehabilitation

## 2013-06-19 ENCOUNTER — Ambulatory Visit: Payer: No Typology Code available for payment source | Admitting: Rehabilitation

## 2013-06-21 ENCOUNTER — Ambulatory Visit: Payer: No Typology Code available for payment source | Admitting: Rehabilitation

## 2013-06-26 ENCOUNTER — Ambulatory Visit: Payer: No Typology Code available for payment source | Admitting: Rehabilitation

## 2013-06-28 ENCOUNTER — Ambulatory Visit: Payer: No Typology Code available for payment source | Admitting: Rehabilitation

## 2013-06-29 ENCOUNTER — Other Ambulatory Visit: Payer: Self-pay | Admitting: Family Medicine

## 2013-06-29 NOTE — Telephone Encounter (Signed)
Rx filled and sent to [facility]. 

## 2013-07-03 ENCOUNTER — Ambulatory Visit: Payer: No Typology Code available for payment source | Admitting: Rehabilitation

## 2013-07-05 ENCOUNTER — Ambulatory Visit: Payer: No Typology Code available for payment source | Admitting: Rehabilitation

## 2013-07-24 ENCOUNTER — Ambulatory Visit (INDEPENDENT_AMBULATORY_CARE_PROVIDER_SITE_OTHER): Payer: PRIVATE HEALTH INSURANCE | Admitting: General Practice

## 2013-07-24 DIAGNOSIS — Z7901 Long term (current) use of anticoagulants: Secondary | ICD-10-CM

## 2013-07-24 DIAGNOSIS — I4891 Unspecified atrial fibrillation: Secondary | ICD-10-CM

## 2013-07-24 DIAGNOSIS — I4821 Permanent atrial fibrillation: Secondary | ICD-10-CM | POA: Insufficient documentation

## 2013-07-24 HISTORY — DX: Unspecified atrial fibrillation: I48.91

## 2013-07-24 HISTORY — DX: Long term (current) use of anticoagulants: Z79.01

## 2013-07-24 LAB — POCT INR: INR: 2.8

## 2013-08-15 ENCOUNTER — Encounter: Payer: Self-pay | Admitting: Cardiology

## 2013-08-15 ENCOUNTER — Telehealth: Payer: Self-pay | Admitting: *Deleted

## 2013-08-15 NOTE — Telephone Encounter (Signed)
Patient has question regarding if Dr. [facility] accepts [facility] as an insurance. Routed to [place] in [employer]. Patient requesting call back on Thursday, Nov 6th, if possible @ [phone]

## 2013-08-21 ENCOUNTER — Ambulatory Visit (INDEPENDENT_AMBULATORY_CARE_PROVIDER_SITE_OTHER): Payer: Self-pay | Admitting: Ophthalmology

## 2013-08-23 NOTE — Telephone Encounter (Signed)
Forwarded to [employer] in [employer]

## 2013-08-30 ENCOUNTER — Ambulatory Visit (INDEPENDENT_AMBULATORY_CARE_PROVIDER_SITE_OTHER): Payer: PRIVATE HEALTH INSURANCE | Admitting: Ophthalmology

## 2013-09-04 ENCOUNTER — Ambulatory Visit (INDEPENDENT_AMBULATORY_CARE_PROVIDER_SITE_OTHER): Payer: No Typology Code available for payment source | Admitting: General Practice

## 2013-09-04 DIAGNOSIS — Z7901 Long term (current) use of anticoagulants: Secondary | ICD-10-CM

## 2013-09-04 DIAGNOSIS — I4891 Unspecified atrial fibrillation: Secondary | ICD-10-CM

## 2013-09-04 LAB — POCT INR: INR: 2.5

## 2013-09-10 ENCOUNTER — Telehealth: Payer: Self-pay | Admitting: Cardiology

## 2013-09-10 NOTE — Telephone Encounter (Signed)
New message     [facility] on [address] is waiting for the doctor to call and approve generic lipitor.

## 2013-09-11 NOTE — Telephone Encounter (Signed)
Follow up     Pt request Marvin Foster to call him

## 2013-09-11 NOTE — Telephone Encounter (Signed)
Marvin Foster can you help with this   Marvin Foster at 09/10/2013 8:15 AM     Status: Signed        New message  [facility] on [address] is waiting for the doctor to call and approve generic lipitor.

## 2013-09-11 NOTE — Telephone Encounter (Signed)
Patient wanted refills on Lipitor. Dr. Beverely Low is now managing his cholesterol, so patient notified to contact their office for refill.  Patient agreeable to this.

## 2013-09-13 ENCOUNTER — Encounter: Payer: Self-pay | Admitting: Family Medicine

## 2013-09-13 ENCOUNTER — Ambulatory Visit (INDEPENDENT_AMBULATORY_CARE_PROVIDER_SITE_OTHER): Payer: No Typology Code available for payment source | Admitting: Family Medicine

## 2013-09-13 VITALS — BP 120/72 | HR 57 | Temp 98.4°F | Resp 16 | Wt 236.1 lb

## 2013-09-13 DIAGNOSIS — E785 Hyperlipidemia, unspecified: Secondary | ICD-10-CM

## 2013-09-13 DIAGNOSIS — R29898 Other symptoms and signs involving the musculoskeletal system: Secondary | ICD-10-CM

## 2013-09-13 DIAGNOSIS — E119 Type 2 diabetes mellitus without complications: Secondary | ICD-10-CM

## 2013-09-13 LAB — LIPID PANEL
Cholesterol: 109 mg/dL (ref 0–200)
HDL: 33.4 mg/dL — ABNORMAL LOW (ref >39.00)
LDL Cholesterol: 51 mg/dL (ref 0–99)
Total CHOL/HDL Ratio: 3
Triglycerides: 123 mg/dL (ref 0.0–149.0)
VLDL: 24.6 mg/dL (ref 0.0–40.0)

## 2013-09-13 LAB — BASIC METABOLIC PANEL
BUN: 16 mg/dL (ref 6–23)
CO2: 28 mEq/L (ref 19–32)
Calcium: 9.1 mg/dL (ref 8.4–10.5)
Chloride: 104 mEq/L (ref 96–112)
Creatinine, Ser: 0.9 mg/dL (ref 0.4–1.5)
GFR: 83.57 mL/min (ref >60.00)
Glucose, Bld: 127 mg/dL — ABNORMAL HIGH (ref 70–99)
Potassium: 4.3 mEq/L (ref 3.5–5.1)
Sodium: 140 mEq/L (ref 135–145)

## 2013-09-13 LAB — HEPATIC FUNCTION PANEL
ALT: 34 U/L (ref 0–53)
AST: 40 U/L — ABNORMAL HIGH (ref 0–37)
Albumin: 3.9 g/dL (ref 3.5–5.2)
Alkaline Phosphatase: 43 U/L (ref 39–117)
Bilirubin, Direct: 0.1 mg/dL (ref 0.0–0.3)
Total Bilirubin: 1 mg/dL (ref 0.3–1.2)
Total Protein: 7.3 g/dL (ref 6.0–8.3)

## 2013-09-13 LAB — HEMOGLOBIN A1C: Hgb A1c MFr Bld: 6.6 % — ABNORMAL HIGH (ref 4.6–6.5)

## 2013-09-13 MED ORDER — ATORVASTATIN CALCIUM 40 MG PO TABS: 40.0000 mg | ORAL_TABLET | Freq: Every day | ORAL | Status: DC

## 2013-09-13 NOTE — Assessment & Plan Note (Signed)
Chronic problem.  UTD on eye exam.  Asymptomatic.  Check labs.  Adjust meds prn

## 2013-09-13 NOTE — Assessment & Plan Note (Signed)
Improved since starting PT and doing home exercise program.  Reassured pt that painless popping of joints is normal.  Will follow.

## 2013-09-13 NOTE — Patient Instructions (Signed)
Follow up in 3-4 months to recheck diabetes We'll notify you of your lab results and make any changes if needed Keep up the good work!  You look great! Call with any questions or concerns Happy Holidays!!! 

## 2013-09-13 NOTE — Progress Notes (Signed)
   Subjective:    Patient ID: Marvin Foster, male    DOB: October 09, 1934, 79 y.o.   MRN: [id number]  HPI Pre visit review using our clinic review tool, if applicable. No additional management support is needed unless otherwise documented below in the visit note.  DM- chronic problem, on Metformin.  On ACE for renal protection.  UTD on eye exam.  Denies numbnes of hands/feet.  No CP, SOB, HAs, visual changes, abd pain, N/V.  Has lost 8 lbs.  CBGs running 110-120 fasting.  Cholesterol- chronic problem, on Lipitor.  Denies abd pain, N/V, myalgias.  Bilateral leg weakness- went to PT for 9 sessions and is continuing home exercise program.  Feels weakness is better but now hearing 'popping noise' when doing leg lifts on R.   Review of Systems For ROS see HPI     Objective:   Physical Exam  Vitals reviewed. Constitutional: He is oriented to person, place, and time. He appears well-developed and well-nourished. No distress.  HENT:  Head: Normocephalic and atraumatic.  Eyes: Conjunctivae and EOM are normal. Pupils are equal, round, and reactive to light.  Neck: Normal range of motion. Neck supple. No thyromegaly present.  Cardiovascular: Normal rate, regular rhythm, normal heart sounds and intact distal pulses.   No murmur heard. Pulmonary/Chest: Effort normal and breath sounds normal. No respiratory distress.  Abdominal: Soft. Bowel sounds are normal. He exhibits no distension.  Musculoskeletal: He exhibits no edema.  Lymphadenopathy:    He has no cervical adenopathy.  Neurological: He is alert and oriented to person, place, and time. No cranial nerve deficit.  Skin: Skin is warm and dry.  Psychiatric: He has a normal mood and affect. His behavior is normal.          Assessment & Plan:

## 2013-09-13 NOTE — Assessment & Plan Note (Signed)
Chronic problem.  Tolerating statin w/out difficulty.  Check labs.  Adjust meds prn  

## 2013-09-14 ENCOUNTER — Encounter: Payer: Self-pay | Admitting: General Practice

## 2013-09-17 ENCOUNTER — Other Ambulatory Visit: Payer: Self-pay | Admitting: Family Medicine

## 2013-09-18 NOTE — Telephone Encounter (Signed)
Rx sent to the pharmacy by e-script.//AB/CMA 

## 2013-10-14 ENCOUNTER — Other Ambulatory Visit: Payer: Self-pay | Admitting: Family Medicine

## 2013-10-16 ENCOUNTER — Ambulatory Visit (INDEPENDENT_AMBULATORY_CARE_PROVIDER_SITE_OTHER): Payer: Medicare HMO | Admitting: *Deleted

## 2013-10-16 DIAGNOSIS — I4891 Unspecified atrial fibrillation: Secondary | ICD-10-CM

## 2013-10-16 DIAGNOSIS — Z7901 Long term (current) use of anticoagulants: Secondary | ICD-10-CM

## 2013-10-16 LAB — POCT INR: INR: 1.8

## 2013-10-31 ENCOUNTER — Ambulatory Visit: Payer: PRIVATE HEALTH INSURANCE | Admitting: Cardiology

## 2013-11-06 ENCOUNTER — Encounter (INDEPENDENT_AMBULATORY_CARE_PROVIDER_SITE_OTHER): Payer: Self-pay

## 2013-11-06 ENCOUNTER — Ambulatory Visit (INDEPENDENT_AMBULATORY_CARE_PROVIDER_SITE_OTHER): Payer: Medicare HMO | Admitting: Cardiology

## 2013-11-06 ENCOUNTER — Ambulatory Visit (INDEPENDENT_AMBULATORY_CARE_PROVIDER_SITE_OTHER): Payer: Medicare HMO | Admitting: *Deleted

## 2013-11-06 ENCOUNTER — Encounter: Payer: Self-pay | Admitting: Cardiology

## 2013-11-06 VITALS — BP 129/75 | HR 80 | Ht 70.0 in | Wt 234.3 lb

## 2013-11-06 DIAGNOSIS — I4891 Unspecified atrial fibrillation: Secondary | ICD-10-CM

## 2013-11-06 DIAGNOSIS — Z0000 Encounter for general adult medical examination without abnormal findings: Secondary | ICD-10-CM | POA: Insufficient documentation

## 2013-11-06 DIAGNOSIS — Z7901 Long term (current) use of anticoagulants: Secondary | ICD-10-CM

## 2013-11-06 DIAGNOSIS — I10 Essential (primary) hypertension: Secondary | ICD-10-CM

## 2013-11-06 DIAGNOSIS — I2510 Atherosclerotic heart disease of native coronary artery without angina pectoris: Secondary | ICD-10-CM

## 2013-11-06 DIAGNOSIS — Z5181 Encounter for therapeutic drug level monitoring: Secondary | ICD-10-CM | POA: Insufficient documentation

## 2013-11-06 LAB — POCT INR: INR: 2.5

## 2013-11-06 NOTE — Progress Notes (Signed)
[facility]. [address], [place]  [address] Phone: [phone] Fax:  [phone]  Date:  11/06/2013   ID:  Marvin Foster, Kathan [birth year], MRN [id number]  PCP:  Annye Asa, MD   History of Present Illness: Marvin Foster is a 80 y.o. male with hx of coronary artery disease, diabetes, hyperlipidemia, and atrial fibrillation ( has been maintaining NSR) He has been doing very well, no complaints. No shortness of breath, no chest pain, no exertional anginal symptoms. 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 He is currently Coumadin for atrial fibrillation. Metoprolol tartrate to 100 mg BID. He states he is doing well on the therapy without any symptoms of atrial fibrillation, nor med side effects    Wt Readings from Last 3 Encounters:  11/06/13 234 lb 4.8 oz (106.278 kg)  09/13/13 236 lb 2 oz (107.106 kg)  06/07/13 249 lb 3.2 oz (113.036 kg)     Past Medical History  Diagnosis Date  . Spinal stenosis 01/20/05  . CAD (coronary artery disease)   . Hyperlipidemia   . Hypertension   . Arthritis 12/2005    left hand  . Neuropathy     with pain  . Diabetes mellitus     Past Surgical History  Procedure Laterality Date  . Back surgery    . Coronary angioplasty with stent placement    . Cataract extraction  08/11/09    right    Current Outpatient Prescriptions  Medication Sig Dispense Refill  . ACCU-CHEK AVIVA PLUS test strip USE STRIP TO CHECK GLUCOSE TWICE DAILY  100 each  6  . ACCU-CHEK FASTCLIX LANCETS MISC 1 Device by Does not apply route daily.  102 each  11  . atorvastatin (LIPITOR) 40 MG tablet Take 1 tablet (40 mg total) by mouth at bedtime.  90 tablet  1  . Calcium Carbonate-Vitamin D (CALCIUM 600 + D PO) Take 1 tablet by mouth 2 (two) times daily.       . enalapril (VASOTEC) 5 MG tablet Take 5 mg by mouth daily.        . fish oil-omega-3 fatty acids 1000 MG capsule Take 4 g by mouth daily.       . metFORMIN  (GLUCOPHAGE) 1000 MG tablet TAKE ONE TABLET BY MOUTH TWICE DAILY  180 tablet  1  . metoprolol (LOPRESSOR) 100 MG tablet Take 1 tablet by mouth 2 (two) times daily.      . Multiple Vitamin (MULTIVITAMIN) capsule Take 1 capsule by mouth daily.        . prednisoLONE acetate (PRED FORTE) 1 % ophthalmic suspension       . pregabalin (LYRICA) 50 MG capsule Take 50 mg by mouth at bedtime.        [name] Kitchen VIGAMOX 0.5 % ophthalmic solution       . warfarin (COUMADIN) 5 MG tablet Take 5 mg by mouth daily.         No current facility-administered medications for this visit.    Allergies:    Allergies  Allergen Reactions  . Neomycin-Bacitracin Zn-Polymyx   . Penicillins     Social History:  The patient  reports that he has never smoked. He does not have any smokeless tobacco history on file. He reports that he does not drink alcohol or use illicit drugs.   ROS:  Please see the history of present illness.   Denies any syncope, bleeding, orthopnea,  PND    PHYSICAL EXAM: VS:  BP 129/75  Pulse 80  Ht 5\' 10"  (1.778 m)  Wt 234 lb 4.8 oz (106.278 kg)  BMI 33.62 kg/m2 Well nourished, well developed, in no acute distress HEENT: normal Neck: no JVD Cardiac:  normal S1, S2; RRR; no murmur Lungs:  clear to auscultation bilaterally, no wheezing, rhonchi or rales Abd: soft, nontender, no hepatomegaly Ext: no edema Skin: warm and dry Neuro: no focal abnormalities noted  EKG:  Sinus rhythm rate 80 with no other changes.     ASSESSMENT AND PLAN:  1. Coronary artery disease-currently stable, no exertional anginal symptoms, doing well. Proximal LAD 03/01/2005 stent. Stress test 2006 no ischemia 2. Atrial fibrillation-has been maintaining sinus rhythm for quite some time now. We will continue with anticoagulation nonetheless because of his risk of atrial fibrillation/stroke. Only on metoprolol currently well controlled. Currently sinus rhythm on EKG. 3. Hyperlipidemia-atorvastatin. Goal LDL less than 70.  09/13/13, LDL 51. 4. Obesity-great Job with weight loss.  Signed, Candee Furbish, MD [facility]  11/06/2013 3:26 PM

## 2013-11-06 NOTE — Patient Instructions (Signed)
Your physician recommends that you continue on your current medications as directed. Please refer to the Current Medication list given to you today.  Your physician wants you to follow-up in: 1 year with Dr. Doretha Sou will receive a reminder letter in the mail two months in advance. If you don't receive a letter, please call our office to schedule the follow-up appointment.

## 2013-11-09 ENCOUNTER — Telehealth: Payer: Self-pay | Admitting: Cardiology

## 2013-11-09 NOTE — Telephone Encounter (Signed)
Spoke with patient advised that he has CAD ,which is considered a heart disease.

## 2013-11-09 NOTE — Telephone Encounter (Signed)
New Problem:  Pt states his insurance requires him to complete a medical hx form. Pt states the form is asking if he has heart disease.. Pt wants to know yes or no, if he has Heart Disease... Pt would like a call back.

## 2013-12-11 ENCOUNTER — Telehealth: Payer: Self-pay | Admitting: *Deleted

## 2013-12-11 MED ORDER — ENALAPRIL MALEATE 5 MG PO TABS: 5.0000 mg | ORAL_TABLET | Freq: Every day | ORAL | Status: DC

## 2013-12-11 NOTE — Telephone Encounter (Signed)
refill 

## 2013-12-14 ENCOUNTER — Ambulatory Visit (INDEPENDENT_AMBULATORY_CARE_PROVIDER_SITE_OTHER): Payer: Medicare HMO

## 2013-12-14 DIAGNOSIS — Z5181 Encounter for therapeutic drug level monitoring: Secondary | ICD-10-CM

## 2013-12-14 DIAGNOSIS — Z7901 Long term (current) use of anticoagulants: Secondary | ICD-10-CM

## 2013-12-14 DIAGNOSIS — I4891 Unspecified atrial fibrillation: Secondary | ICD-10-CM

## 2013-12-14 LAB — POCT INR: INR: 2

## 2014-01-03 ENCOUNTER — Ambulatory Visit (INDEPENDENT_AMBULATORY_CARE_PROVIDER_SITE_OTHER): Payer: Medicare HMO | Admitting: Family Medicine

## 2014-01-03 ENCOUNTER — Encounter: Payer: Self-pay | Admitting: Family Medicine

## 2014-01-03 VITALS — BP 120/74 | HR 54 | Temp 98.4°F | Resp 16 | Wt 233.4 lb

## 2014-01-03 DIAGNOSIS — E119 Type 2 diabetes mellitus without complications: Secondary | ICD-10-CM

## 2014-01-03 LAB — BASIC METABOLIC PANEL
BUN: 16 mg/dL (ref 6–23)
CO2: 28 mEq/L (ref 19–32)
Calcium: 9.2 mg/dL (ref 8.4–10.5)
Chloride: 103 mEq/L (ref 96–112)
Creatinine, Ser: 1 mg/dL (ref 0.4–1.5)
GFR: 73.4 mL/min (ref >60.00)
Glucose, Bld: 128 mg/dL — ABNORMAL HIGH (ref 70–99)
Potassium: 4.5 mEq/L (ref 3.5–5.1)
Sodium: 139 mEq/L (ref 135–145)

## 2014-01-03 LAB — HEMOGLOBIN A1C: Hgb A1c MFr Bld: 6.5 % (ref 4.6–6.5)

## 2014-01-03 MED ORDER — METOPROLOL TARTRATE 100 MG PO TABS
100.0000 mg | ORAL_TABLET | Freq: Two times a day (BID) | ORAL | Status: DC
Start: 2014-01-03 — End: 2014-02-19

## 2014-01-03 NOTE — Progress Notes (Signed)
   Subjective:    Patient ID: JEANCARLO LEFFLER, male    DOB: May 18, 1934, 80 y.o.   MRN: [id number]  HPI DM- chronic problem, on Metformin.  On ACE for renal protection.  On statin.  Denies symptomatic lows.  UTD on eye exam.  No CP, SOB, HAs, visual changes, edema, numbness/tingling hands/feet.  CBGs 110-120s.   Review of Systems For ROS see HPI     Objective:   Physical Exam  Vitals reviewed. Constitutional: He is oriented to person, place, and time. He appears well-developed and well-nourished. No distress.  obese  HENT:  Head: Normocephalic and atraumatic.  Eyes: Conjunctivae and EOM are normal. Pupils are equal, round, and reactive to light.  Neck: Normal range of motion. Neck supple. No thyromegaly present.  Cardiovascular: Normal rate, regular rhythm, normal heart sounds and intact distal pulses.   No murmur heard. Pulmonary/Chest: Effort normal and breath sounds normal. No respiratory distress.  Abdominal: Soft. Bowel sounds are normal. He exhibits no distension.  Musculoskeletal: He exhibits no edema.  Lymphadenopathy:    He has no cervical adenopathy.  Neurological: He is alert and oriented to person, place, and time. No cranial nerve deficit.  Skin: Skin is warm and dry.  Psychiatric: He has a normal mood and affect. His behavior is normal.          Assessment & Plan:

## 2014-01-03 NOTE — Addendum Note (Signed)
Addended by: [name] on: 01/03/2014 09:34 AM   Modules accepted: Orders

## 2014-01-03 NOTE — Progress Notes (Signed)
Pre visit review using our clinic review tool, if applicable. No additional management support is needed unless otherwise documented below in the visit note. 

## 2014-01-03 NOTE — Patient Instructions (Signed)
Schedule your complete physical in 3-4 months We'll notify you of your lab results and make any changes if needed Keep up the good work! Happy Spring! 

## 2014-01-03 NOTE — Assessment & Plan Note (Signed)
Chronic problem.  Well controlled based on CBGs.  UTD on eye exam.  Foot exam done today.  On ACE for renal protection.  Check labs.  Adjust meds prn

## 2014-01-11 ENCOUNTER — Ambulatory Visit (INDEPENDENT_AMBULATORY_CARE_PROVIDER_SITE_OTHER): Payer: Medicare HMO | Admitting: Pharmacist

## 2014-01-11 DIAGNOSIS — Z5181 Encounter for therapeutic drug level monitoring: Secondary | ICD-10-CM

## 2014-01-11 DIAGNOSIS — I4891 Unspecified atrial fibrillation: Secondary | ICD-10-CM

## 2014-01-11 DIAGNOSIS — Z7901 Long term (current) use of anticoagulants: Secondary | ICD-10-CM

## 2014-01-11 LAB — POCT INR: INR: 2

## 2014-01-15 ENCOUNTER — Telehealth: Payer: Self-pay

## 2014-01-15 NOTE — Telephone Encounter (Signed)
Relevant patient education assigned to patient using Emmi. ° °

## 2014-01-17 ENCOUNTER — Other Ambulatory Visit: Payer: Self-pay

## 2014-02-08 ENCOUNTER — Ambulatory Visit (INDEPENDENT_AMBULATORY_CARE_PROVIDER_SITE_OTHER): Payer: Medicare HMO

## 2014-02-08 DIAGNOSIS — I4891 Unspecified atrial fibrillation: Secondary | ICD-10-CM

## 2014-02-08 DIAGNOSIS — Z7901 Long term (current) use of anticoagulants: Secondary | ICD-10-CM

## 2014-02-08 DIAGNOSIS — Z5181 Encounter for therapeutic drug level monitoring: Secondary | ICD-10-CM

## 2014-02-08 LAB — POCT INR: INR: 2.1

## 2014-02-11 LAB — HM DIABETES EYE EXAM

## 2014-02-14 ENCOUNTER — Encounter: Payer: Self-pay | Admitting: General Practice

## 2014-02-19 ENCOUNTER — Telehealth: Payer: Self-pay | Admitting: Family Medicine

## 2014-02-19 MED ORDER — METOPROLOL TARTRATE 100 MG PO TABS: 100.0000 mg | ORAL_TABLET | Freq: Two times a day (BID) | ORAL | Status: DC

## 2014-02-19 NOTE — Telephone Encounter (Signed)
Resent as a 90 day supply to [facility] on [address]. Patient aware. No further questions or concerns voiced.

## 2014-02-19 NOTE — Telephone Encounter (Signed)
Caller name: Servando Relation to pt: Call back [place], Brenda.   Reason for call: pt would like the RX  metoprolol (LOPRESSOR) 100 MG tablet to be resent to the pharmacy as a 90 supply instead of 30 day.  Please advise pt if this is possible.

## 2014-02-26 ENCOUNTER — Other Ambulatory Visit: Payer: Self-pay | Admitting: Family Medicine

## 2014-02-26 NOTE — Telephone Encounter (Signed)
Med filled.  

## 2014-02-28 ENCOUNTER — Telehealth: Payer: Self-pay | Admitting: *Deleted

## 2014-02-28 NOTE — Telephone Encounter (Signed)
Caller name:  Shravan Relation to pt:  self Call back number:  [phone] Pharmacy:  Reason for call:   Pt has [facility], and they are billing him for out-of-network for Dr. Birdie Riddle.    Please advise.  bw

## 2014-03-20 ENCOUNTER — Other Ambulatory Visit: Payer: Self-pay | Admitting: Family Medicine

## 2014-03-21 NOTE — Telephone Encounter (Signed)
Med filled.  

## 2014-03-22 ENCOUNTER — Ambulatory Visit (INDEPENDENT_AMBULATORY_CARE_PROVIDER_SITE_OTHER): Payer: Medicare HMO | Admitting: *Deleted

## 2014-03-22 DIAGNOSIS — Z7901 Long term (current) use of anticoagulants: Secondary | ICD-10-CM

## 2014-03-22 DIAGNOSIS — I4891 Unspecified atrial fibrillation: Secondary | ICD-10-CM

## 2014-03-22 DIAGNOSIS — Z5181 Encounter for therapeutic drug level monitoring: Secondary | ICD-10-CM

## 2014-03-22 LAB — POCT INR: INR: 1.8

## 2014-04-10 ENCOUNTER — Ambulatory Visit (INDEPENDENT_AMBULATORY_CARE_PROVIDER_SITE_OTHER): Payer: Medicare HMO | Admitting: *Deleted

## 2014-04-10 DIAGNOSIS — Z7901 Long term (current) use of anticoagulants: Secondary | ICD-10-CM

## 2014-04-10 DIAGNOSIS — I4891 Unspecified atrial fibrillation: Secondary | ICD-10-CM

## 2014-04-10 DIAGNOSIS — Z5181 Encounter for therapeutic drug level monitoring: Secondary | ICD-10-CM

## 2014-04-10 LAB — POCT INR: INR: 2.3

## 2014-05-08 ENCOUNTER — Ambulatory Visit (INDEPENDENT_AMBULATORY_CARE_PROVIDER_SITE_OTHER): Payer: Medicare HMO | Admitting: Pharmacist

## 2014-05-08 DIAGNOSIS — I4891 Unspecified atrial fibrillation: Secondary | ICD-10-CM

## 2014-05-08 DIAGNOSIS — Z7901 Long term (current) use of anticoagulants: Secondary | ICD-10-CM

## 2014-05-08 DIAGNOSIS — Z5181 Encounter for therapeutic drug level monitoring: Secondary | ICD-10-CM

## 2014-05-08 LAB — POCT INR: INR: 3.5

## 2014-05-09 ENCOUNTER — Ambulatory Visit (INDEPENDENT_AMBULATORY_CARE_PROVIDER_SITE_OTHER): Payer: Medicare HMO | Admitting: Family Medicine

## 2014-05-09 ENCOUNTER — Encounter: Payer: Self-pay | Admitting: Family Medicine

## 2014-05-09 VITALS — BP 120/76 | HR 57 | Temp 98.0°F | Resp 16 | Ht 69.0 in | Wt 234.4 lb

## 2014-05-09 DIAGNOSIS — I10 Essential (primary) hypertension: Secondary | ICD-10-CM

## 2014-05-09 DIAGNOSIS — E785 Hyperlipidemia, unspecified: Secondary | ICD-10-CM

## 2014-05-09 DIAGNOSIS — Z0000 Encounter for general adult medical examination without abnormal findings: Secondary | ICD-10-CM

## 2014-05-09 DIAGNOSIS — I2510 Atherosclerotic heart disease of native coronary artery without angina pectoris: Secondary | ICD-10-CM

## 2014-05-09 DIAGNOSIS — E119 Type 2 diabetes mellitus without complications: Secondary | ICD-10-CM

## 2014-05-09 LAB — CBC WITH DIFFERENTIAL/PLATELET
Basophils Absolute: 0 10*3/uL (ref 0.0–0.1)
Basophils Relative: 0.8 % (ref 0.0–3.0)
Eosinophils Absolute: 0.2 10*3/uL (ref 0.0–0.7)
Eosinophils Relative: 4.5 % (ref 0.0–5.0)
HCT: 37.9 % — ABNORMAL LOW (ref 39.0–52.0)
Hemoglobin: 12.5 g/dL — ABNORMAL LOW (ref 13.0–17.0)
Lymphocytes Relative: 35.4 % (ref 12.0–46.0)
Lymphs Abs: 2 10*3/uL (ref 0.7–4.0)
MCHC: 33.1 g/dL (ref 30.0–36.0)
MCV: 95.8 fl (ref 78.0–100.0)
Monocytes Absolute: 0.4 10*3/uL (ref 0.1–1.0)
Monocytes Relative: 7.5 % (ref 3.0–12.0)
Neutro Abs: 2.9 10*3/uL (ref 1.4–7.7)
Neutrophils Relative %: 51.8 % (ref 43.0–77.0)
Platelets: 128 10*3/uL — ABNORMAL LOW (ref 150.0–400.0)
RBC: 3.95 Mil/uL — ABNORMAL LOW (ref 4.22–5.81)
RDW: 14.1 % (ref 11.5–15.5)
WBC: 5.5 10*3/uL (ref 4.0–10.5)

## 2014-05-09 LAB — BASIC METABOLIC PANEL
BUN: 15 mg/dL (ref 6–23)
CO2: 26 mEq/L (ref 19–32)
Calcium: 9.2 mg/dL (ref 8.4–10.5)
Chloride: 107 mEq/L (ref 96–112)
Creatinine, Ser: 1 mg/dL (ref 0.4–1.5)
GFR: 75.85 mL/min (ref >60.00)
Glucose, Bld: 147 mg/dL — ABNORMAL HIGH (ref 70–99)
Potassium: 4.6 mEq/L (ref 3.5–5.1)
Sodium: 140 mEq/L (ref 135–145)

## 2014-05-09 LAB — LIPID PANEL
Cholesterol: 103 mg/dL (ref 0–200)
HDL: 34.5 mg/dL — ABNORMAL LOW (ref >39.00)
LDL Cholesterol: 41 mg/dL (ref 0–99)
NonHDL: 68.5
Total CHOL/HDL Ratio: 3
Triglycerides: 138 mg/dL (ref 0.0–149.0)
VLDL: 27.6 mg/dL (ref 0.0–40.0)

## 2014-05-09 LAB — PSA: PSA: 2.01 ng/mL (ref 0.10–4.00)

## 2014-05-09 LAB — HEPATIC FUNCTION PANEL
ALT: 28 U/L (ref 0–53)
AST: 29 U/L (ref 0–37)
Albumin: 3.9 g/dL (ref 3.5–5.2)
Alkaline Phosphatase: 40 U/L (ref 39–117)
Bilirubin, Direct: 0.2 mg/dL (ref 0.0–0.3)
Total Bilirubin: 1 mg/dL (ref 0.2–1.2)
Total Protein: 7.1 g/dL (ref 6.0–8.3)

## 2014-05-09 LAB — TSH: TSH: 3.88 u[IU]/mL (ref 0.35–4.50)

## 2014-05-09 LAB — HEMOGLOBIN A1C: Hgb A1c MFr Bld: 6.6 % — ABNORMAL HIGH (ref 4.6–6.5)

## 2014-05-09 NOTE — Assessment & Plan Note (Signed)
Chronic problem.  Adequate control.  Check labs.  No anticipated med changes.

## 2014-05-09 NOTE — Patient Instructions (Signed)
Follow up in 3-4 months to recheck Diabetes Keep up the good work!  You look great! We'll notify you of your lab results and make any changes if needed Call with any questions or concerns Enjoy the rest of your summer!

## 2014-05-09 NOTE — Assessment & Plan Note (Signed)
Pt's PE WNL.  UTD on health maintenance.  Written screening schedule updated and given to pt.  Check labs.  Anticipatory guidance provided.  

## 2014-05-09 NOTE — Assessment & Plan Note (Signed)
Chronic problem.  Following w/ cardiology.  Goal is risk reduction in form of diabetes/BP/lipid control.  On beta blocker, ACE, statin.  Will continue to follow.

## 2014-05-09 NOTE — Assessment & Plan Note (Signed)
Chronic problem.  Tolerating meds w/o difficulty.  UTD on eye exam.  Check labs.  Adjust meds prn

## 2014-05-09 NOTE — Progress Notes (Signed)
Pre visit review using our clinic review tool, if applicable. No additional management support is needed unless otherwise documented below in the visit note. 

## 2014-05-09 NOTE — Progress Notes (Signed)
   Subjective:    Patient ID: Marvin Foster, male    DOB: Nov 12, 1933, 80 y.o.   MRN: [id number]  HPI Here today for CPE.  Risk Factors: DM- chronic problem, on Metfroomin.  On Enalapril for renal protection.  Home CBGs 110-130s.  Denies symptomatic lows.  UTD on eye exam. No numbness/tingling  HTN- chronic problem, on enalapril/metoprolol.  No CP, SOB, HAs, visual changes, edema. Hyperlipidemia- chronic problem, on Lipitor.  Denies abd pain, N/V, myalgias. AFib- chronic problem, following w/ Cards.  On Coumadin  Physical Activity: active w/ volunteering but no formal exercise Fall Risk: low risk Depression: no current sxs  Hearing: decreased to whispered voice, normal to conversational tones ADL's: independent Cognitive: normal linear thought process, memory and attention intact. Home Safety: safe at home Height, Weight, BMI, Visual Acuity: see vitals, vision corrected to 20/20 w/ glasses Counseling: UTD on colonoscopy, eye exam. Labs Ordered: See A&P Care Plan: See A&P    Review of Systems Patient reports no vision/hearing changes, anorexia, fever ,adenopathy, persistant/recurrent hoarseness, swallowing issues, chest pain, palpitations, edema, persistant/recurrent cough, hemoptysis, dyspnea (rest,exertional, paroxysmal nocturnal), gastrointestinal  bleeding (melena, rectal bleeding), abdominal pain, excessive heart burn, GU symptoms (dysuria, hematuria, voiding/incontinence issues) syncope, focal weakness, memory loss, numbness & tingling, skin/hair/nail changes, depression, anxiety, abnormal bruising/bleeding, musculoskeletal symptoms/signs.     Objective:   Physical Exam BP 120/76  Pulse 57  Temp(Src) 98 F (36.7 C) (Oral)  Resp 16  Ht 5\' 9"  (1.753 m)  Wt 234 lb 6 oz (106.312 kg)  BMI 34.60 kg/m2  SpO2 97%  General Appearance:    Alert, cooperative, no distress, appears stated age  Head:    Normocephalic, without obvious abnormality, atraumatic  Eyes:    PERRL,  conjunctiva/corneas clear, EOM's intact, fundi    benign, both eyes       Ears:    Normal TM's and external ear canals, both ears  Nose:   Nares normal, septum midline, mucosa normal, no drainage   or sinus tenderness  Throat:   Lips, mucosa, and tongue normal; teeth and gums normal  Neck:   Supple, symmetrical, trachea midline, no adenopathy;       thyroid:  No enlargement/tenderness/nodules  Back:     Symmetric, no curvature, ROM normal, no CVA tenderness  Lungs:     Clear to auscultation bilaterally, respirations unlabored  Chest wall:    No tenderness or deformity  Heart:    Regular rate and rhythm, S1 and S2 normal, no murmur, rub   or gallop  Abdomen:     Soft, non-tender, bowel sounds active all four quadrants,    no masses, no organomegaly  Genitalia:    Normal male without lesion, discharge or tenderness  Rectal:    Normal tone, normal prostate, no masses or tenderness  Extremities:   Extremities normal, atraumatic, no cyanosis or edema  Pulses:   2+ and symmetric all extremities  Skin:   Skin color, texture, turgor normal, no rashes or lesions  Lymph nodes:   Cervical, supraclavicular, and axillary nodes normal  Neurologic:   CNII-XII intact. Normal strength, sensation and reflexes      throughout          Assessment & Plan:

## 2014-05-09 NOTE — Assessment & Plan Note (Signed)
Chronic problem.  Tolerating statin w/o difficulty.  Check labs.  Adjust meds prn  

## 2014-05-29 ENCOUNTER — Telehealth: Payer: Self-pay | Admitting: Cardiology

## 2014-05-29 NOTE — Telephone Encounter (Signed)
New message  Pt called requests a call back to discuss who will be on duty for his coumadin appt?? Please call

## 2014-05-29 NOTE — Telephone Encounter (Signed)
Patient advised that the physician of the day tomorrow is Jennell Corner.  He had an issue with insurance under [facility] and wanted to make sure she wasn't the DOD.

## 2014-05-30 ENCOUNTER — Ambulatory Visit (INDEPENDENT_AMBULATORY_CARE_PROVIDER_SITE_OTHER): Payer: Medicare HMO

## 2014-05-30 DIAGNOSIS — I4891 Unspecified atrial fibrillation: Secondary | ICD-10-CM

## 2014-05-30 DIAGNOSIS — Z5181 Encounter for therapeutic drug level monitoring: Secondary | ICD-10-CM

## 2014-05-30 DIAGNOSIS — Z7901 Long term (current) use of anticoagulants: Secondary | ICD-10-CM

## 2014-05-30 LAB — POCT INR: INR: 3.1

## 2014-06-20 ENCOUNTER — Ambulatory Visit (INDEPENDENT_AMBULATORY_CARE_PROVIDER_SITE_OTHER): Payer: Medicare HMO | Admitting: *Deleted

## 2014-06-20 DIAGNOSIS — I4891 Unspecified atrial fibrillation: Secondary | ICD-10-CM

## 2014-06-20 DIAGNOSIS — Z7901 Long term (current) use of anticoagulants: Secondary | ICD-10-CM

## 2014-06-20 DIAGNOSIS — Z5181 Encounter for therapeutic drug level monitoring: Secondary | ICD-10-CM

## 2014-06-20 LAB — POCT INR: INR: 2.2

## 2014-06-21 ENCOUNTER — Other Ambulatory Visit: Payer: Self-pay | Admitting: General Practice

## 2014-06-21 MED ORDER — METFORMIN HCL 1000 MG PO TABS: ORAL_TABLET | ORAL | Status: DC

## 2014-07-07 ENCOUNTER — Emergency Department (HOSPITAL_COMMUNITY)
Admission: EM | Admit: 2014-07-07 | Discharge: 2014-07-07 | Disposition: A | Discharge status: Home / Self Care | Payer: Medicare HMO | Source: Home / Self Care | Attending: Family Medicine | Admitting: Family Medicine

## 2014-07-07 ENCOUNTER — Encounter (HOSPITAL_COMMUNITY): Payer: Self-pay | Admitting: Emergency Medicine

## 2014-07-07 DIAGNOSIS — L259 Unspecified contact dermatitis, unspecified cause: Secondary | ICD-10-CM

## 2014-07-07 MED ORDER — MOMETASONE FUROATE 0.1 % EX CREA: 1.0000 "application " | TOPICAL_CREAM | Freq: Every day | CUTANEOUS | Status: DC

## 2014-07-07 NOTE — Discharge Instructions (Signed)

## 2014-07-07 NOTE — ED Provider Notes (Signed)
CSN: [id number]     Arrival date & time 07/07/14  1123 History   First MD Initiated Contact with Patient 07/07/14 1131     No chief complaint on file.  (Consider location/radiation/quality/duration/timing/severity/associated sxs/prior Treatment) HPI    80 year old male presents for evaluation of a rash on his left thumb. He sustained a burn about 3 weeks ago. He started to put Neosporin on this because he did not want to get infected and then started to develop a rash on his arm as well as some generalized itching. He has a previously diagnosed allergy to Neosporin. He stopped Neosporin 2 weeks ago, and when this was still not getting better he started to put on bacitracin ointment one week ago. That seemed to make the rash worse again. The general itching has subsided. He now only has the rash on his thumb. He wants to get treatment for the rash and make sure it is not infected. No systemic symptoms at this time  Past Medical History  Diagnosis Date  . Spinal stenosis 01/20/05  . CAD (coronary artery disease)   . Hyperlipidemia   . Hypertension   . Arthritis 12/2005    left hand  . Neuropathy     with pain  . Diabetes mellitus    Past Surgical History  Procedure Laterality Date  . Back surgery    . Coronary angioplasty with stent placement    . Cataract extraction  08/11/09    right   Family History  Problem Relation Age of Onset  . Heart failure Father   . Heart failure Mother    History  Substance Use Topics  . Smoking status: Never Smoker   . Smokeless tobacco: Not on file  . Alcohol Use: No    Review of Systems  Skin: Positive for rash.  All other systems reviewed and are negative.   Allergies  Neomycin-bacitracin zn-polymyx and Penicillins  Home Medications   Prior to Admission medications   Medication Sig Start Date End Date Taking? Authorizing Provider  ACCU-CHEK AVIVA PLUS test strip USE STRIP TO CHECK GLUCOSE TWICE DAILY 10/14/13   [name], MD   ACCU-CHEK FASTCLIX LANCETS MISC 1 Device by Does not apply route daily. 04/05/12   [name], MD  atorvastatin (LIPITOR) 40 MG tablet TAKE ONE TABLET BY MOUTH AT BEDTIME 02/26/14   [name], MD  Calcium Carbonate-Vitamin D (CALCIUM 600 + D PO) Take 1 tablet by mouth 2 (two) times daily.     Historical Provider, MD  enalapril (VASOTEC) 5 MG tablet Take 1 tablet (5 mg total) by mouth daily. 12/11/13   Candee Furbish, MD  fish oil-omega-3 fatty acids 1000 MG capsule Take 4 g by mouth daily.     Historical Provider, MD  metFORMIN (GLUCOPHAGE) 1000 MG tablet TAKE ONE TABLET BY MOUTH TWICE DAILY 06/21/14   [name], MD  metoprolol (LOPRESSOR) 100 MG tablet Take 1 tablet (100 mg total) by mouth 2 (two) times daily. 02/19/14   [name], MD  mometasone (ELOCON) 0.1 % cream Apply 1 application topically daily. 07/07/14   Liam Graham, PA-C  Multiple Vitamin (MULTIVITAMIN) capsule Take 1 capsule by mouth daily.      Historical Provider, MD  prednisoLONE acetate (PRED FORTE) 1 % ophthalmic suspension  02/18/12   Historical Provider, MD  pregabalin (LYRICA) 50 MG capsule Take 50 mg by mouth at bedtime.      Historical Provider, MD  VIGAMOX 0.5 % ophthalmic solution  01/03/12   Historical Provider, MD  warfarin (COUMADIN) 5 MG tablet Take 5 mg by mouth daily.      Historical Provider, MD   BP 151/77  Pulse 60  Temp(Src) 99.1 F (37.3 C) (Oral)  Resp 16  SpO2 98% Physical Exam  Nursing note and vitals reviewed. Constitutional: He is oriented to person, place, and time. He appears well-developed and well-nourished. No distress.  HENT:  Head: Normocephalic.  Pulmonary/Chest: Effort normal and breath sounds normal. No respiratory distress. He has no wheezes.  Neurological: He is alert and oriented to person, place, and time. Coordination normal.  Skin: Skin is warm and dry. Rash noted. Rash is maculopapular (erythematous maculopapular rash on the dorsum of the left thumb,  nontender, without induration). He is not diaphoretic.  Psychiatric: He has a normal mood and affect. Judgment normal.    ED Course  Procedures (including critical care time) Labs Review Labs Reviewed - No data to display  Imaging Review No results found.   MDM   1. Contact dermatitis    Consistent with irritant contact dermatitis. No signs of infection. Discontinue any topical antibiotics. Will prescribe topical corticosteroid cream. Followup if no improvement in 4-5 days, or sooner if worsening.   Meds ordered this encounter  Medications  . mometasone (ELOCON) 0.1 % cream    Sig: Apply 1 application topically daily.    Dispense:  15 g    Refill:  0    Order Specific Question:  Supervising Provider    Answer:  Ihor Gully D [[id number]]       Liam Graham, PA-C 07/07/14 1148

## 2014-07-07 NOTE — ED Notes (Signed)
Pt  Has  A  Rash  On his  l  Thumb  3  Weeks  Ago  He applied  Neosporin  To  The  Area   And  A  Rash  With itching  Developed        He  Stopped  The  Neosporin  And  Then  Applied  Bacitracin   Generic  And  The  Rash  Got  wot  Worse      The  Pt  Has  A rash to  Dorsum of l  Thumb  Non  Tender     No  Induration      No  Signs  Of  Infection

## 2014-07-10 NOTE — ED Provider Notes (Signed)
Medical screening examination/treatment/procedure(s) were performed by a resident physician or non-physician practitioner and as the supervising physician I was immediately available for consultation/collaboration.  Lynne Leader, MD    Gregor Hams, MD 07/10/14 [phone]

## 2014-07-17 ENCOUNTER — Encounter: Payer: Self-pay | Admitting: Family Medicine

## 2014-07-17 ENCOUNTER — Ambulatory Visit (INDEPENDENT_AMBULATORY_CARE_PROVIDER_SITE_OTHER): Payer: Medicare HMO | Admitting: Family Medicine

## 2014-07-17 VITALS — BP 126/78 | HR 72 | Temp 98.3°F | Resp 17 | Wt 236.0 lb

## 2014-07-17 DIAGNOSIS — J069 Acute upper respiratory infection, unspecified: Secondary | ICD-10-CM | POA: Insufficient documentation

## 2014-07-17 DIAGNOSIS — B9789 Other viral agents as the cause of diseases classified elsewhere: Principal | ICD-10-CM

## 2014-07-17 MED ORDER — BENZONATATE 200 MG PO CAPS: 200.0000 mg | ORAL_CAPSULE | Freq: Three times a day (TID) | ORAL | Status: DC | PRN

## 2014-07-17 NOTE — Patient Instructions (Signed)
Follow up as scheduled Continue Coricidin as needed for nasal congestion Use the Tessalon for cough as needed You can add OTC Mucinex DM as needed You are safe to return to work Drink plenty of fluids REST! Call with any questions or concerns Hang in there!

## 2014-07-17 NOTE — Assessment & Plan Note (Signed)
New.  Pt's sxs and non-focal PE consistent w/ viral illness.  Cough meds prn.  Reviewed supportive care and red flags that should prompt return.  Pt expressed understanding and is in agreement w/ plan.

## 2014-07-17 NOTE — Progress Notes (Signed)
   Subjective:    Patient ID: Marvin Foster, male    DOB: 1934/07/14, 80 y.o.   MRN: [id number]  HPI URI- sxs started ~1 week ago.  Woke Monday w/ hoarseness, cough, nasal congestion.  Started OTC meds w/ some improvement.  No fevers.  No sinus pain/pressure.  No ear pain.  No N/V/D.  + sick contacts.  Cough is not productive.   Review of Systems For ROS see HPI     Objective:   Physical Exam  Vitals reviewed. Constitutional: He appears well-developed and well-nourished. No distress.  HENT:  Head: Normocephalic and atraumatic.  No TTP over sinuses + turbinate edema + PND TMs normal bilaterally  Eyes: Conjunctivae and EOM are normal. Pupils are equal, round, and reactive to light.  Neck: Normal range of motion. Neck supple.  Cardiovascular: Normal rate, regular rhythm and normal heart sounds.   Pulmonary/Chest: Effort normal and breath sounds normal. No respiratory distress. He has no wheezes.  No cough heard  Lymphadenopathy:    He has no cervical adenopathy.  Skin: Skin is warm and dry.          Assessment & Plan:

## 2014-07-17 NOTE — Progress Notes (Signed)
Pre visit review using our clinic review tool, if applicable. No additional management support is needed unless otherwise documented below in the visit note. 

## 2014-07-18 ENCOUNTER — Ambulatory Visit (INDEPENDENT_AMBULATORY_CARE_PROVIDER_SITE_OTHER): Payer: Medicare HMO | Admitting: Pharmacist Clinician (PhC)/ Clinical Pharmacy Specialist

## 2014-07-18 DIAGNOSIS — Z7901 Long term (current) use of anticoagulants: Secondary | ICD-10-CM

## 2014-07-18 DIAGNOSIS — I4891 Unspecified atrial fibrillation: Secondary | ICD-10-CM

## 2014-07-18 DIAGNOSIS — Z5181 Encounter for therapeutic drug level monitoring: Secondary | ICD-10-CM

## 2014-07-18 LAB — POCT INR: INR: 2.6

## 2014-07-26 ENCOUNTER — Other Ambulatory Visit: Payer: Self-pay

## 2014-08-29 ENCOUNTER — Ambulatory Visit (INDEPENDENT_AMBULATORY_CARE_PROVIDER_SITE_OTHER): Payer: Medicare HMO | Admitting: Surgery

## 2014-08-29 DIAGNOSIS — Z7901 Long term (current) use of anticoagulants: Secondary | ICD-10-CM

## 2014-08-29 DIAGNOSIS — I4891 Unspecified atrial fibrillation: Secondary | ICD-10-CM

## 2014-08-29 DIAGNOSIS — Z5181 Encounter for therapeutic drug level monitoring: Secondary | ICD-10-CM

## 2014-08-29 LAB — POCT INR: INR: 2.5

## 2014-09-06 ENCOUNTER — Other Ambulatory Visit: Payer: Self-pay | Admitting: *Deleted

## 2014-09-06 MED ORDER — WARFARIN SODIUM 5 MG PO TABS: 5.0000 mg | ORAL_TABLET | Freq: Every day | ORAL | Status: DC

## 2014-09-12 ENCOUNTER — Encounter: Payer: Self-pay | Admitting: Family Medicine

## 2014-09-12 ENCOUNTER — Other Ambulatory Visit: Payer: Self-pay | Admitting: General Practice

## 2014-09-12 ENCOUNTER — Other Ambulatory Visit: Payer: Self-pay | Admitting: Family Medicine

## 2014-09-12 ENCOUNTER — Ambulatory Visit (INDEPENDENT_AMBULATORY_CARE_PROVIDER_SITE_OTHER): Payer: Medicare HMO | Admitting: Family Medicine

## 2014-09-12 VITALS — BP 124/76 | HR 72 | Temp 98.2°F | Resp 16 | Wt 238.5 lb

## 2014-09-12 DIAGNOSIS — E119 Type 2 diabetes mellitus without complications: Secondary | ICD-10-CM

## 2014-09-12 DIAGNOSIS — E785 Hyperlipidemia, unspecified: Secondary | ICD-10-CM

## 2014-09-12 DIAGNOSIS — I10 Essential (primary) hypertension: Secondary | ICD-10-CM

## 2014-09-12 DIAGNOSIS — E1169 Type 2 diabetes mellitus with other specified complication: Secondary | ICD-10-CM

## 2014-09-12 LAB — HEPATIC FUNCTION PANEL
ALT: 34 U/L (ref 0–53)
AST: 42 U/L — ABNORMAL HIGH (ref 0–37)
Albumin: 4 g/dL (ref 3.5–5.2)
Alkaline Phosphatase: 45 U/L (ref 39–117)
Bilirubin, Direct: 0.1 mg/dL (ref 0.0–0.3)
Total Bilirubin: 1.1 mg/dL (ref 0.2–1.2)
Total Protein: 7.3 g/dL (ref 6.0–8.3)

## 2014-09-12 LAB — CBC WITH DIFFERENTIAL/PLATELET
Basophils Absolute: 0 10*3/uL (ref 0.0–0.1)
Basophils Relative: 0.6 % (ref 0.0–3.0)
Eosinophils Absolute: 0.3 10*3/uL (ref 0.0–0.7)
Eosinophils Relative: 4.8 % (ref 0.0–5.0)
HCT: 39.4 % (ref 39.0–52.0)
Hemoglobin: 12.9 g/dL — ABNORMAL LOW (ref 13.0–17.0)
Lymphocytes Relative: 30.3 % (ref 12.0–46.0)
Lymphs Abs: 1.8 10*3/uL (ref 0.7–4.0)
MCHC: 32.7 g/dL (ref 30.0–36.0)
MCV: 94.4 fl (ref 78.0–100.0)
Monocytes Absolute: 0.5 10*3/uL (ref 0.1–1.0)
Monocytes Relative: 8.5 % (ref 3.0–12.0)
Neutro Abs: 3.4 10*3/uL (ref 1.4–7.7)
Neutrophils Relative %: 55.8 % (ref 43.0–77.0)
Platelets: 147 10*3/uL — ABNORMAL LOW (ref 150.0–400.0)
RBC: 4.17 Mil/uL — ABNORMAL LOW (ref 4.22–5.81)
RDW: 13.7 % (ref 11.5–15.5)
WBC: 6.1 10*3/uL (ref 4.0–10.5)

## 2014-09-12 LAB — TSH: TSH: 4.4 u[IU]/mL (ref 0.35–4.50)

## 2014-09-12 LAB — LIPID PANEL
Cholesterol: 110 mg/dL (ref 0–200)
HDL: 26 mg/dL — ABNORMAL LOW (ref >39.00)
LDL Cholesterol: 59 mg/dL (ref 0–99)
NonHDL: 84
Total CHOL/HDL Ratio: 4
Triglycerides: 126 mg/dL (ref 0.0–149.0)
VLDL: 25.2 mg/dL (ref 0.0–40.0)

## 2014-09-12 LAB — BASIC METABOLIC PANEL
BUN: 20 mg/dL (ref 6–23)
CO2: 24 mEq/L (ref 19–32)
Calcium: 8.8 mg/dL (ref 8.4–10.5)
Chloride: 103 mEq/L (ref 96–112)
Creatinine, Ser: 1 mg/dL (ref 0.4–1.5)
GFR: 78.47 mL/min (ref >60.00)
Glucose, Bld: 158 mg/dL — ABNORMAL HIGH (ref 70–99)
Potassium: 3.9 mEq/L (ref 3.5–5.1)
Sodium: 137 mEq/L (ref 135–145)

## 2014-09-12 LAB — HEMOGLOBIN A1C: Hgb A1c MFr Bld: 7 % — ABNORMAL HIGH (ref 4.6–6.5)

## 2014-09-12 MED ORDER — ATORVASTATIN CALCIUM 40 MG PO TABS: 40.0000 mg | ORAL_TABLET | Freq: Every day | ORAL | Status: DC

## 2014-09-12 NOTE — Progress Notes (Signed)
   Subjective:    Patient ID: HOSEY BURMESTER, male    DOB: 09/05/1934, 80 y.o.   MRN: [id number]  HPI DM- chronic problem, on Metformin.  On ACE for renal protection.  UTD on eye exam.  No numbness/tingling of hands/feet.  Pt reports CBGs are 'erratic' but remain <140.  Hyperlipidemia- chronic problem, on Lipitor.  No abd pain, N/V, myalgias.  HTN- on enalapril, metoprolol.  Denies CP, SOB, HAs, visual changes, edema.  + cough today w/ nasal congestion.   Review of Systems For ROS see HPI     Objective:   Physical Exam  Constitutional: He is oriented to person, place, and time. He appears well-developed and well-nourished. No distress.  HENT:  Head: Normocephalic and atraumatic.  Eyes: Conjunctivae and EOM are normal. Pupils are equal, round, and reactive to light.  Neck: Normal range of motion. Neck supple. No thyromegaly present.  Cardiovascular: Normal rate, regular rhythm, normal heart sounds and intact distal pulses.   No murmur heard. Pulmonary/Chest: Effort normal and breath sounds normal. No respiratory distress.  Abdominal: Soft. Bowel sounds are normal. He exhibits no distension.  Musculoskeletal: He exhibits no edema.  Lymphadenopathy:    He has no cervical adenopathy.  Neurological: He is alert and oriented to person, place, and time. No cranial nerve deficit.  Skin: Skin is warm and dry.  Psychiatric: He has a normal mood and affect. His behavior is normal.  Vitals reviewed.         Assessment & Plan:

## 2014-09-12 NOTE — Assessment & Plan Note (Signed)
Chronic problem.  Well controlled.  Asymptomatic.  Check labs.  No anticipated med changes. 

## 2014-09-12 NOTE — Telephone Encounter (Signed)
Med filled.  

## 2014-09-12 NOTE — Assessment & Plan Note (Signed)
Chronic problem.  Tolerating statin w/o difficulty.  Check labs.  Adjust meds prn  

## 2014-09-12 NOTE — Patient Instructions (Signed)
Follow up in 3-4 months to recheck diabetes We'll notify you of your lab results and make any changes if needed Keep up the good work!  You look good! Call with any questions or concerns Merry Christmas!  Safe Travels!

## 2014-09-12 NOTE — Progress Notes (Signed)
Pre visit review using our clinic review tool, if applicable. No additional management support is needed unless otherwise documented below in the visit note. 

## 2014-09-12 NOTE — Assessment & Plan Note (Signed)
Chronic problem.  Usually well controlled.  UTD on eye exam.  On ACE for renal protection.  Check labs.  Adjust meds prn

## 2014-09-16 ENCOUNTER — Telehealth: Payer: Self-pay | Admitting: Family Medicine

## 2014-09-16 MED ORDER — METOPROLOL TARTRATE 100 MG PO TABS: 100.0000 mg | ORAL_TABLET | Freq: Two times a day (BID) | ORAL | Status: DC

## 2014-09-16 NOTE — Telephone Encounter (Signed)
Caller name: Irl Relation to pt: self Call back number: [phone] Pharmacy: [facility] on [address]  Reason for call:   Patient requesting a 90 day supply of metoprolol

## 2014-09-16 NOTE — Telephone Encounter (Signed)
Med filled.  

## 2014-10-10 ENCOUNTER — Ambulatory Visit (INDEPENDENT_AMBULATORY_CARE_PROVIDER_SITE_OTHER): Payer: Medicare HMO | Admitting: Pharmacist

## 2014-10-10 DIAGNOSIS — Z5181 Encounter for therapeutic drug level monitoring: Secondary | ICD-10-CM

## 2014-10-10 DIAGNOSIS — Z7901 Long term (current) use of anticoagulants: Secondary | ICD-10-CM

## 2014-10-10 DIAGNOSIS — I4891 Unspecified atrial fibrillation: Secondary | ICD-10-CM

## 2014-10-10 LAB — POCT INR: INR: 2.4

## 2014-10-16 ENCOUNTER — Other Ambulatory Visit: Payer: Self-pay | Admitting: Cardiology

## 2014-11-18 ENCOUNTER — Other Ambulatory Visit: Payer: Self-pay | Admitting: Cardiology

## 2014-11-21 ENCOUNTER — Ambulatory Visit (INDEPENDENT_AMBULATORY_CARE_PROVIDER_SITE_OTHER): Payer: Medicare HMO | Admitting: Pharmacist Clinician (PhC)/ Clinical Pharmacy Specialist

## 2014-11-21 DIAGNOSIS — I4891 Unspecified atrial fibrillation: Secondary | ICD-10-CM

## 2014-11-21 DIAGNOSIS — Z7901 Long term (current) use of anticoagulants: Secondary | ICD-10-CM

## 2014-11-21 DIAGNOSIS — Z5181 Encounter for therapeutic drug level monitoring: Secondary | ICD-10-CM

## 2014-11-21 LAB — POCT INR: INR: 2.2

## 2014-11-25 ENCOUNTER — Telehealth: Payer: Self-pay | Admitting: Family Medicine

## 2014-11-25 MED ORDER — GLUCOSE BLOOD VI STRP: ORAL_STRIP | Status: DC

## 2014-11-25 NOTE — Telephone Encounter (Signed)
Med filled.  

## 2014-11-25 NOTE — Telephone Encounter (Signed)
Caller name: Tayten  Relation to pt: self Call back number: [phone] Pharmacy: [facility] on [address]   Reason for call:   Requesting a refill of test strips

## 2014-12-03 LAB — HM DIABETES EYE EXAM

## 2015-01-01 ENCOUNTER — Encounter: Payer: Self-pay | Admitting: General Practice

## 2015-01-02 ENCOUNTER — Ambulatory Visit (INDEPENDENT_AMBULATORY_CARE_PROVIDER_SITE_OTHER): Payer: Medicare HMO | Admitting: *Deleted

## 2015-01-02 DIAGNOSIS — I4891 Unspecified atrial fibrillation: Secondary | ICD-10-CM | POA: Diagnosis not present

## 2015-01-02 DIAGNOSIS — Z5181 Encounter for therapeutic drug level monitoring: Secondary | ICD-10-CM

## 2015-01-02 DIAGNOSIS — Z7901 Long term (current) use of anticoagulants: Secondary | ICD-10-CM | POA: Diagnosis not present

## 2015-01-02 LAB — POCT INR: INR: 2.1

## 2015-01-08 ENCOUNTER — Other Ambulatory Visit: Payer: Self-pay | Admitting: Cardiology

## 2015-01-15 ENCOUNTER — Ambulatory Visit: Payer: Medicare HMO | Admitting: Family Medicine

## 2015-01-16 ENCOUNTER — Encounter: Payer: Self-pay | Admitting: Family Medicine

## 2015-01-16 ENCOUNTER — Ambulatory Visit (INDEPENDENT_AMBULATORY_CARE_PROVIDER_SITE_OTHER): Payer: Medicare HMO | Admitting: Family Medicine

## 2015-01-16 VITALS — BP 122/72 | HR 53 | Temp 98.0°F | Resp 16 | Ht 69.0 in | Wt 240.1 lb

## 2015-01-16 DIAGNOSIS — Z23 Encounter for immunization: Secondary | ICD-10-CM

## 2015-01-16 DIAGNOSIS — E119 Type 2 diabetes mellitus without complications: Secondary | ICD-10-CM

## 2015-01-16 LAB — BASIC METABOLIC PANEL
BUN: 17 mg/dL (ref 6–23)
CO2: 27 mEq/L (ref 19–32)
Calcium: 9.6 mg/dL (ref 8.4–10.5)
Chloride: 103 mEq/L (ref 96–112)
Creatinine, Ser: 1.06 mg/dL (ref 0.40–1.50)
GFR: 71.61 mL/min (ref >60.00)
Glucose, Bld: 139 mg/dL — ABNORMAL HIGH (ref 70–99)
Potassium: 4.6 mEq/L (ref 3.5–5.1)
Sodium: 137 mEq/L (ref 135–145)

## 2015-01-16 LAB — POCT URINALYSIS DIPSTICK
Bilirubin, UA: NEGATIVE
Blood, UA: NEGATIVE
Glucose, UA: NEGATIVE
Ketones, UA: NEGATIVE
Leukocytes, UA: NEGATIVE
Nitrite, UA: NEGATIVE
Protein, UA: NEGATIVE
Spec Grav, UA: 1.02
Urobilinogen, UA: 0.2
pH, UA: 6

## 2015-01-16 LAB — HEMOGLOBIN A1C: Hgb A1c MFr Bld: 6.6 % — ABNORMAL HIGH (ref 4.6–6.5)

## 2015-01-16 NOTE — Addendum Note (Signed)
Addended by: Kris Hartmann on: 01/16/2015 08:53 AM   Modules accepted: Orders

## 2015-01-16 NOTE — Patient Instructions (Signed)
Follow up in 3-4 months to recheck diabetes and cholesterol We'll notify you of your lab results and make any changes if needed Keep up the good work on healthy diet and activity as able Call with any questions or concerns Happy Spring!!!

## 2015-01-16 NOTE — Progress Notes (Signed)
Pre visit review using our clinic review tool, if applicable. No additional management support is needed unless otherwise documented below in the visit note. 

## 2015-01-16 NOTE — Assessment & Plan Note (Signed)
Chronic problem.  UTD on eye exam.  On ACE for renal protection.  Currently asymptomatic.  Check labs.  Adjust meds prn.  Prevnar given today

## 2015-01-16 NOTE — Progress Notes (Signed)
   Subjective:    Patient ID: Marvin Foster, male    DOB: 07-16-1934, 81 y.o.   MRN: [id number]  HPI DM- chronic problem.  UTD on eye exam.  On ACE for renal protection.  On Metformin BID.  BP is well controlled.  No CP, SOB, HAs, visual changes, edema.  Denies symptomatic lows.  CBGs 120s-150s.  No abd pain, N/V.  No numbness/tingling of hands/feet above baseline.   Review of Systems For ROS see HPI     Objective:   Physical Exam  Constitutional: He is oriented to person, place, and time. He appears well-developed and well-nourished. No distress.  obese  HENT:  Head: Normocephalic and atraumatic.  Eyes: Conjunctivae and EOM are normal. Pupils are equal, round, and reactive to light.  Neck: Normal range of motion. Neck supple. No thyromegaly present.  Cardiovascular: Normal rate, regular rhythm, normal heart sounds and intact distal pulses.   No murmur heard. Pulmonary/Chest: Effort normal and breath sounds normal. No respiratory distress.  Abdominal: Soft. Bowel sounds are normal. He exhibits no distension.  Musculoskeletal: He exhibits no edema.  Lymphadenopathy:    He has no cervical adenopathy.  Neurological: He is alert and oriented to person, place, and time. No cranial nerve deficit.  Skin: Skin is warm and dry.  Psychiatric: He has a normal mood and affect. His behavior is normal.  Vitals reviewed.         Assessment & Plan:

## 2015-02-03 ENCOUNTER — Other Ambulatory Visit: Payer: Self-pay | Admitting: Cardiology

## 2015-02-05 ENCOUNTER — Ambulatory Visit (INDEPENDENT_AMBULATORY_CARE_PROVIDER_SITE_OTHER): Payer: Medicare HMO | Admitting: Family Medicine

## 2015-02-05 ENCOUNTER — Encounter: Payer: Self-pay | Admitting: Family Medicine

## 2015-02-05 VITALS — BP 130/82 | HR 62 | Temp 97.9°F | Resp 16 | Wt 242.0 lb

## 2015-02-05 DIAGNOSIS — M25562 Pain in left knee: Secondary | ICD-10-CM | POA: Diagnosis not present

## 2015-02-05 MED ORDER — DICLOFENAC SODIUM 1 % TD GEL
4.0000 g | Freq: Four times a day (QID) | TRANSDERMAL | Status: DC
Start: 2015-02-05 — End: 2017-01-26

## 2015-02-05 NOTE — Progress Notes (Signed)
Pre visit review using our clinic review tool, if applicable. No additional management support is needed unless otherwise documented below in the visit note. 

## 2015-02-05 NOTE — Patient Instructions (Signed)
Follow up as scheduled We'll call you with your orthopedic appt Use the Voltaren gel directly on the knee for pain/inflammation Continue to ice and elevate as you are able Call with any questions or concerns Hang in there!!

## 2015-02-05 NOTE — Progress Notes (Signed)
   Subjective:    Patient ID: Marvin Foster, male    DOB: 02/12/1934, 81 y.o.   MRN: [id number]  HPI L knee pain- pt denies injury, no swelling, bruising, 'not really sore to the touch'.  Pt reports pain started Saturday when getting out of the car.  Started using IcyHot w/ some relief.  Had to leave work early on Monday b/c of the severe pain w/ walking.  Pt then iced and elevated leg w/o improvement.  No pain w/ flexion/extension of knee.  Pain w/ side to side or rotation of knee.  Most painful w/ weight bearing/walking.  Can tolerate pain when standing still.   Review of Systems For ROS see HPI     Objective:   Physical Exam  Constitutional: He is oriented to person, place, and time. He appears well-developed and well-nourished. No distress.  Cardiovascular: Intact distal pulses.   Musculoskeletal: He exhibits no edema.  Pain w/ full flexion of L knee Pain w/ palpation over L medial joint line Pain over L anteromedial knee w/ weight bearing/ambulation Pain w/ lateral movement/rotation of knee  Neurological: He is alert and oriented to person, place, and time. Coordination normal.  Skin: Skin is warm and dry. No erythema.  No bruising present  Vitals reviewed.         Assessment & Plan:

## 2015-02-05 NOTE — Assessment & Plan Note (Signed)
New.  Given that pt's pain started after getting out of the car, concern for knee sprain or meniscal injury.  Start topical NSAID due to long term use of coumadin.  Refer to ortho for evaluation and tx.  Reviewed supportive care and red flags that should prompt return.  Pt expressed understanding and is in agreement w/ plan.

## 2015-02-13 ENCOUNTER — Ambulatory Visit (INDEPENDENT_AMBULATORY_CARE_PROVIDER_SITE_OTHER): Payer: Medicare HMO

## 2015-02-13 DIAGNOSIS — Z5181 Encounter for therapeutic drug level monitoring: Secondary | ICD-10-CM | POA: Diagnosis not present

## 2015-02-13 DIAGNOSIS — I4891 Unspecified atrial fibrillation: Secondary | ICD-10-CM

## 2015-02-13 DIAGNOSIS — Z7901 Long term (current) use of anticoagulants: Secondary | ICD-10-CM

## 2015-02-13 LAB — POCT INR: INR: 2

## 2015-03-01 ENCOUNTER — Other Ambulatory Visit: Payer: Self-pay | Admitting: Cardiology

## 2015-03-04 ENCOUNTER — Other Ambulatory Visit: Payer: Self-pay | Admitting: General Practice

## 2015-03-04 MED ORDER — ATORVASTATIN CALCIUM 40 MG PO TABS: 40.0000 mg | ORAL_TABLET | Freq: Every day | ORAL | Status: DC

## 2015-03-06 ENCOUNTER — Other Ambulatory Visit: Payer: Self-pay | Admitting: Family Medicine

## 2015-03-07 NOTE — Telephone Encounter (Signed)
Med filled.  

## 2015-03-08 ENCOUNTER — Other Ambulatory Visit: Payer: Self-pay | Admitting: Family Medicine

## 2015-03-11 ENCOUNTER — Other Ambulatory Visit: Payer: Self-pay | Admitting: Cardiology

## 2015-03-11 NOTE — Telephone Encounter (Signed)
Med filled.  

## 2015-03-23 ENCOUNTER — Other Ambulatory Visit: Payer: Self-pay | Admitting: Cardiology

## 2015-03-27 ENCOUNTER — Ambulatory Visit (INDEPENDENT_AMBULATORY_CARE_PROVIDER_SITE_OTHER): Payer: Medicare HMO | Admitting: *Deleted

## 2015-03-27 DIAGNOSIS — Z5181 Encounter for therapeutic drug level monitoring: Secondary | ICD-10-CM | POA: Diagnosis not present

## 2015-03-27 DIAGNOSIS — I4891 Unspecified atrial fibrillation: Secondary | ICD-10-CM | POA: Diagnosis not present

## 2015-03-27 DIAGNOSIS — Z7901 Long term (current) use of anticoagulants: Secondary | ICD-10-CM

## 2015-03-27 LAB — POCT INR: INR: 3

## 2015-04-20 ENCOUNTER — Other Ambulatory Visit: Payer: Self-pay | Admitting: Cardiology

## 2015-04-22 ENCOUNTER — Other Ambulatory Visit: Payer: Self-pay

## 2015-04-22 MED ORDER — ENALAPRIL MALEATE 5 MG PO TABS: ORAL_TABLET | ORAL | Status: DC

## 2015-04-28 ENCOUNTER — Telehealth: Payer: Self-pay | Admitting: Family Medicine

## 2015-04-28 DIAGNOSIS — D229 Melanocytic nevi, unspecified: Secondary | ICD-10-CM

## 2015-04-28 NOTE — Telephone Encounter (Signed)
Referral placed.

## 2015-04-28 NOTE — Telephone Encounter (Signed)
Caller name: Da[place] Relation to pt: self Call back [place]:  Reason for call:   Patient is requesting a referral to a dermatologist. He is wanting a mole removed.

## 2015-05-07 ENCOUNTER — Encounter: Payer: Self-pay | Admitting: Family Medicine

## 2015-05-07 ENCOUNTER — Ambulatory Visit (INDEPENDENT_AMBULATORY_CARE_PROVIDER_SITE_OTHER): Payer: Medicare HMO | Admitting: Family Medicine

## 2015-05-07 VITALS — BP 120/74 | HR 59 | Temp 98.0°F | Ht 69.0 in | Wt 240.5 lb

## 2015-05-07 DIAGNOSIS — H60543 Acute eczematoid otitis externa, bilateral: Secondary | ICD-10-CM

## 2015-05-07 DIAGNOSIS — H1851 Endothelial corneal dystrophy: Secondary | ICD-10-CM

## 2015-05-07 DIAGNOSIS — I10 Essential (primary) hypertension: Secondary | ICD-10-CM

## 2015-05-07 DIAGNOSIS — E785 Hyperlipidemia, unspecified: Secondary | ICD-10-CM

## 2015-05-07 DIAGNOSIS — E1169 Type 2 diabetes mellitus with other specified complication: Secondary | ICD-10-CM

## 2015-05-07 DIAGNOSIS — E119 Type 2 diabetes mellitus without complications: Secondary | ICD-10-CM

## 2015-05-07 DIAGNOSIS — H18519 Endothelial corneal dystrophy, unspecified eye: Secondary | ICD-10-CM | POA: Insufficient documentation

## 2015-05-07 DIAGNOSIS — H60549 Acute eczematoid otitis externa, unspecified ear: Secondary | ICD-10-CM | POA: Insufficient documentation

## 2015-05-07 MED ORDER — FLUOCINOLONE ACETONIDE 0.01 % OT OIL: 5.0000 [drp] | TOPICAL_OIL | Freq: Two times a day (BID) | OTIC | Status: DC

## 2015-05-07 NOTE — Assessment & Plan Note (Signed)
Chronic problem.  Tolerating statin w/o difficulty.  Check labs.  Adjust meds prn  

## 2015-05-07 NOTE — Assessment & Plan Note (Signed)
Chronic problem.  Well controlled today.  Asymptomatic.  Check labs.  No anticipated med changes. 

## 2015-05-07 NOTE — Assessment & Plan Note (Signed)
New.  Pt was treated w/ oral abx at ENT but continues to have ear flaking/itching.  Will start Dermotic for sxs relief.

## 2015-05-07 NOTE — Progress Notes (Signed)
Pre visit review using our clinic review tool, if applicable. No additional management support is needed unless otherwise documented below in the visit note. 

## 2015-05-07 NOTE — Assessment & Plan Note (Signed)
Chronic problem.  Tolerating Metformin w/o difficulty.  UTD on eye exam, foot exam.  On ACE for renal protection.  Check labs.  Adjust meds prn

## 2015-05-07 NOTE — Progress Notes (Signed)
   Subjective:    Patient ID: Marvin Foster, male    DOB: [birth year], 81 y.o.   MRN: [id number]  HPI DM- chronic problem, on Metformin.  UTD on eye and foot exam.  On ACE for renal protection.  Denies symptomatic lows.  No numbness/tingling of hands/feet.  Denies N/V/D.  HTN- chronic problem, on Enalapril.  Denies CP, SOB, HAs, visual changes, edema.  Hyperlipidemia- chronic problem, on Lipitor daily.  Denies abd pain, N/V, myalgias  Ear dermatitis- pt was treated by ENT but continues to have itching.   Review of Systems For ROS see HPI     Objective:   Physical Exam  Constitutional: He is oriented to person, place, and time. He appears well-developed and well-nourished. No distress.  HENT:  Head: Normocephalic and atraumatic.  Eyes: Conjunctivae and EOM are normal. Pupils are equal, round, and reactive to light.  Neck: Normal range of motion. Neck supple. No thyromegaly present.  Cardiovascular: Normal rate, regular rhythm, normal heart sounds and intact distal pulses.   No murmur heard. Pulmonary/Chest: Effort normal and breath sounds normal. No respiratory distress.  Abdominal: Soft. Bowel sounds are normal. He exhibits no distension.  Musculoskeletal: He exhibits no edema.  Lymphadenopathy:    He has no cervical adenopathy.  Neurological: He is alert and oriented to person, place, and time. No cranial nerve deficit.  Skin: Skin is warm and dry.  Psychiatric: He has a normal mood and affect. His behavior is normal.  Vitals reviewed.         Assessment & Plan:

## 2015-05-07 NOTE — Patient Instructions (Signed)
Schedule your complete physical in 3-4 months We'll notify you of your lab results and make any changes if needed Call and schedule an appt with podiatry Start the ear drops- 5 drops twice daily for 7-10 days and repeat as needed Call with any questions or concerns Enjoy your vacation!!!

## 2015-05-08 ENCOUNTER — Ambulatory Visit (INDEPENDENT_AMBULATORY_CARE_PROVIDER_SITE_OTHER): Payer: Medicare HMO

## 2015-05-08 DIAGNOSIS — Z7901 Long term (current) use of anticoagulants: Secondary | ICD-10-CM | POA: Diagnosis not present

## 2015-05-08 DIAGNOSIS — I4891 Unspecified atrial fibrillation: Secondary | ICD-10-CM

## 2015-05-08 DIAGNOSIS — Z5181 Encounter for therapeutic drug level monitoring: Secondary | ICD-10-CM | POA: Diagnosis not present

## 2015-05-08 LAB — BASIC METABOLIC PANEL
BUN: 18 mg/dL (ref 6–23)
CO2: 27 mEq/L (ref 19–32)
Calcium: 9.4 mg/dL (ref 8.4–10.5)
Chloride: 104 mEq/L (ref 96–112)
Creatinine, Ser: 1.02 mg/dL (ref 0.40–1.50)
GFR: 74.8 mL/min (ref >60.00)
Glucose, Bld: 97 mg/dL (ref 70–99)
Potassium: 4.7 mEq/L (ref 3.5–5.1)
Sodium: 141 mEq/L (ref 135–145)

## 2015-05-08 LAB — HEMOGLOBIN A1C: Hgb A1c MFr Bld: 6.6 % — ABNORMAL HIGH (ref 4.6–6.5)

## 2015-05-08 LAB — LIPID PANEL
Cholesterol: 110 mg/dL (ref 0–200)
HDL: 30.8 mg/dL — ABNORMAL LOW (ref >39.00)
LDL Cholesterol: 49 mg/dL (ref 0–99)
NonHDL: 78.83
Total CHOL/HDL Ratio: 4
Triglycerides: 149 mg/dL (ref 0.0–149.0)
VLDL: 29.8 mg/dL (ref 0.0–40.0)

## 2015-05-08 LAB — HEPATIC FUNCTION PANEL
ALT: 33 U/L (ref 0–53)
AST: 34 U/L (ref 0–37)
Albumin: 4.2 g/dL (ref 3.5–5.2)
Alkaline Phosphatase: 47 U/L (ref 39–117)
Bilirubin, Direct: 0.3 mg/dL (ref 0.0–0.3)
Total Bilirubin: 0.8 mg/dL (ref 0.2–1.2)
Total Protein: 7.1 g/dL (ref 6.0–8.3)

## 2015-05-08 LAB — CBC WITH DIFFERENTIAL/PLATELET
Basophils Absolute: 0 10*3/uL (ref 0.0–0.1)
Basophils Relative: 0.4 % (ref 0.0–3.0)
Eosinophils Absolute: 0.2 10*3/uL (ref 0.0–0.7)
Eosinophils Relative: 3.4 % (ref 0.0–5.0)
HCT: 38.6 % — ABNORMAL LOW (ref 39.0–52.0)
Hemoglobin: 13 g/dL (ref 13.0–17.0)
Lymphocytes Relative: 36.5 % (ref 12.0–46.0)
Lymphs Abs: 2.5 10*3/uL (ref 0.7–4.0)
MCHC: 33.7 g/dL (ref 30.0–36.0)
MCV: 95.5 fl (ref 78.0–100.0)
Monocytes Absolute: 0.5 10*3/uL (ref 0.1–1.0)
Monocytes Relative: 7.9 % (ref 3.0–12.0)
Neutro Abs: 3.5 10*3/uL (ref 1.4–7.7)
Neutrophils Relative %: 51.8 % (ref 43.0–77.0)
Platelets: 150 10*3/uL (ref 150.0–400.0)
RBC: 4.04 Mil/uL — ABNORMAL LOW (ref 4.22–5.81)
RDW: 13.9 % (ref 11.5–15.5)
WBC: 6.8 10*3/uL (ref 4.0–10.5)

## 2015-05-08 LAB — POCT INR: INR: 2

## 2015-05-09 ENCOUNTER — Ambulatory Visit: Payer: Medicare HMO | Admitting: Family Medicine

## 2015-05-12 ENCOUNTER — Other Ambulatory Visit: Payer: Self-pay | Admitting: *Deleted

## 2015-05-12 MED ORDER — WARFARIN SODIUM 5 MG PO TABS: ORAL_TABLET | ORAL | Status: DC

## 2015-05-12 NOTE — Telephone Encounter (Signed)
Patient request the his rx for coumadin be changed to a ninety day supply.

## 2015-05-12 NOTE — Telephone Encounter (Signed)
Refill for ninety day supply done as requested

## 2015-05-14 ENCOUNTER — Other Ambulatory Visit: Payer: Self-pay

## 2015-05-14 MED ORDER — ENALAPRIL MALEATE 5 MG PO TABS: ORAL_TABLET | ORAL | Status: DC

## 2015-05-26 ENCOUNTER — Encounter: Payer: Self-pay | Admitting: Family Medicine

## 2015-05-26 ENCOUNTER — Encounter: Payer: Self-pay | Admitting: Gastroenterology

## 2015-05-26 ENCOUNTER — Ambulatory Visit (INDEPENDENT_AMBULATORY_CARE_PROVIDER_SITE_OTHER): Payer: Medicare HMO | Admitting: Family Medicine

## 2015-05-26 VITALS — BP 130/82 | HR 77 | Temp 98.1°F | Resp 16 | Wt 246.5 lb

## 2015-05-26 DIAGNOSIS — K625 Hemorrhage of anus and rectum: Secondary | ICD-10-CM | POA: Insufficient documentation

## 2015-05-26 DIAGNOSIS — M25562 Pain in left knee: Secondary | ICD-10-CM | POA: Diagnosis not present

## 2015-05-26 DIAGNOSIS — Z7901 Long term (current) use of anticoagulants: Secondary | ICD-10-CM

## 2015-05-26 LAB — CBC WITH DIFFERENTIAL/PLATELET
Basophils Absolute: 0 10*3/uL (ref 0.0–0.1)
Basophils Relative: 0.5 % (ref 0.0–3.0)
Eosinophils Absolute: 0.2 10*3/uL (ref 0.0–0.7)
Eosinophils Relative: 3.3 % (ref 0.0–5.0)
HCT: 35.8 % — ABNORMAL LOW (ref 39.0–52.0)
Hemoglobin: 11.8 g/dL — ABNORMAL LOW (ref 13.0–17.0)
Lymphocytes Relative: 30 % (ref 12.0–46.0)
Lymphs Abs: 2 10*3/uL (ref 0.7–4.0)
MCHC: 32.9 g/dL (ref 30.0–36.0)
MCV: 95.9 fl (ref 78.0–100.0)
Monocytes Absolute: 0.6 10*3/uL (ref 0.1–1.0)
Monocytes Relative: 8.2 % (ref 3.0–12.0)
Neutro Abs: 3.9 10*3/uL (ref 1.4–7.7)
Neutrophils Relative %: 58 % (ref 43.0–77.0)
Platelets: 161 10*3/uL (ref 150.0–400.0)
RBC: 3.73 Mil/uL — ABNORMAL LOW (ref 4.22–5.81)
RDW: 14.5 % (ref 11.5–15.5)
WBC: 6.8 10*3/uL (ref 4.0–10.5)

## 2015-05-26 LAB — PROTIME-INR
INR: 4.5 ratio — ABNORMAL HIGH (ref 0.8–1.0)
Prothrombin Time: 48 s — ABNORMAL HIGH (ref 9.6–13.1)

## 2015-05-26 NOTE — Assessment & Plan Note (Signed)
Ongoing issue for pt.  Personnel officer recommended pt start Tumeric twice daily but this interacts w/ coumadin to thin blood.  Check PT/INR.  Pt to decrease Tumeric to 1 tab daily rather than 2 and may need to stop entirely depending on INR. Pt expressed understanding and is in agreement w/ plan.

## 2015-05-26 NOTE — Assessment & Plan Note (Signed)
New.  Pt w/ evidence of hemorrhoid on PE.  Amount of bleeding sounds consistent w/ diverticular bleed but pt w/o hx of diverticulitis.  Check CBC and PT/INR as pt is on coumadin.  Refer to GI for complete evaluation.  Will need new provider as previous GI ([facility]) is out of network.  Reviewed supportive care and red flags that should prompt return.  Pt expressed understanding and is in agreement w/ plan.

## 2015-05-26 NOTE — Progress Notes (Signed)
   Subjective:    Patient ID: Marvin Foster, male    DOB: [birth year], 80 y.o.   MRN: [id number]  HPI BRBPR- pt is on coumadin- last check on 7/28 was normal at 2.  Pt reports he got up to take a shower on Thursday (while on a cruise) and 'there was significant blood in my underwear- enough to drip on the floor'.  Had 2nd similar episode but less bleeding.  No blood w/ wiping, no blood in toilet bowl.  No pain.  No hx of hemorrhoids.  Last bleeding noted on Friday or Saturday.  Knee pain- home health insurance nurse recommended tumeric and Vit D.  Pt has been unsuccessful in reaching [facility]   Review of Systems For ROS see HPI     Objective:   Physical Exam  Constitutional: He is oriented to person, place, and time. He appears well-developed and well-nourished. No distress.  HENT:  Head: Normocephalic and atraumatic.  Genitourinary: Rectal exam shows no external hemorrhoid, no internal hemorrhoid, no fissure, no mass, no tenderness and anal tone normal. Guaiac positive stool.  Musculoskeletal:  Arthritic changes of knees bilaterally  Neurological: He is alert and oriented to person, place, and time.  Antalgic gait  Vitals reviewed.         Assessment & Plan:

## 2015-05-26 NOTE — Patient Instructions (Signed)
Follow up as scheduled, sooner if needed We'll notify you of your lab results and make any changes if needed We'll call you with your GI appt for complete evaluation of the bleeding We'll call you with your ortho appt Call with any questions or concerns Hang in there!!!

## 2015-05-26 NOTE — Assessment & Plan Note (Signed)
Chronic problem but deteriorating.  Refer to [facility] as pt has not been able to reach them.  Will follow.

## 2015-05-26 NOTE — Progress Notes (Signed)
Pre visit review using our clinic review tool, if applicable. No additional management support is needed unless otherwise documented below in the visit note. 

## 2015-05-27 ENCOUNTER — Telehealth: Payer: Self-pay | Admitting: Family Medicine

## 2015-05-27 ENCOUNTER — Ambulatory Visit (INDEPENDENT_AMBULATORY_CARE_PROVIDER_SITE_OTHER): Payer: Medicare HMO | Admitting: Cardiology

## 2015-05-27 DIAGNOSIS — Z5181 Encounter for therapeutic drug level monitoring: Secondary | ICD-10-CM

## 2015-05-27 DIAGNOSIS — I4891 Unspecified atrial fibrillation: Secondary | ICD-10-CM

## 2015-05-27 NOTE — Telephone Encounter (Signed)
Pt informed of results.

## 2015-05-27 NOTE — Telephone Encounter (Signed)
Pt returning call. Best # [phone].

## 2015-05-29 ENCOUNTER — Telehealth: Payer: Self-pay | Admitting: Family Medicine

## 2015-05-29 NOTE — Telephone Encounter (Signed)
Pt called in stating that a therapist came out to his home. He says that he was told by the therapist  that the Dr will receive a report. He wants to make sure that she received it and he would also like to have a copy of it.   CB number: [phone]

## 2015-05-29 NOTE — Telephone Encounter (Signed)
Will be on the look out for paperwork

## 2015-05-29 NOTE — Telephone Encounter (Signed)
FYI

## 2015-05-31 ENCOUNTER — Encounter: Payer: Self-pay | Admitting: Family Medicine

## 2015-06-12 ENCOUNTER — Ambulatory Visit (INDEPENDENT_AMBULATORY_CARE_PROVIDER_SITE_OTHER): Payer: Medicare HMO

## 2015-06-12 ENCOUNTER — Encounter: Payer: Self-pay | Admitting: Gastroenterology

## 2015-06-12 ENCOUNTER — Telehealth: Payer: Self-pay | Admitting: *Deleted

## 2015-06-12 ENCOUNTER — Ambulatory Visit (INDEPENDENT_AMBULATORY_CARE_PROVIDER_SITE_OTHER): Payer: Medicare HMO | Admitting: Gastroenterology

## 2015-06-12 VITALS — BP 118/68 | HR 70 | Ht 69.0 in | Wt 240.8 lb

## 2015-06-12 DIAGNOSIS — Z860100 Personal history of colon polyps, unspecified: Secondary | ICD-10-CM | POA: Insufficient documentation

## 2015-06-12 DIAGNOSIS — I4891 Unspecified atrial fibrillation: Secondary | ICD-10-CM | POA: Diagnosis not present

## 2015-06-12 DIAGNOSIS — Z7901 Long term (current) use of anticoagulants: Secondary | ICD-10-CM | POA: Diagnosis not present

## 2015-06-12 DIAGNOSIS — Z5181 Encounter for therapeutic drug level monitoring: Secondary | ICD-10-CM | POA: Diagnosis not present

## 2015-06-12 HISTORY — DX: Personal history of colonic polyps: Z86.010

## 2015-06-12 HISTORY — DX: Personal history of colon polyps, unspecified: Z86.0100

## 2015-06-12 HISTORY — DX: Long term (current) use of anticoagulants: Z79.01

## 2015-06-12 LAB — POCT INR: INR: 3.2

## 2015-06-12 MED ORDER — NA SULFATE-K SULFATE-MG SULF 17.5-3.13-1.6 GM/177ML PO SOLN: 1.0000 | Freq: Once | ORAL | Status: DC

## 2015-06-12 NOTE — Telephone Encounter (Signed)
  06/12/2015   RE: Marvin Foster DOB: 07/17/1934 MRN: [id number]   Dear  Dr Marlou Porch,    We have scheduled the above patient for an endoscopic procedure. Our records show that he is on anticoagulation therapy.   Please advise as to how long the patient may come off his therapy of coumadin prior to the procedure, which is scheduled for 08/26/2015.  Please fax back/ or route the completed form to Shirlean Mylar or Dottie at [phone].   Sincerely,    Genella Mech

## 2015-06-12 NOTE — Telephone Encounter (Signed)
PT INFORMED TO HOLD COUMADIN 5 DAYS BEFORE PROCEDURE

## 2015-06-12 NOTE — Progress Notes (Addendum)
06/12/2015 Marvin Foster [id number] [birth year]   HISTORY OF PRESENT ILLNESS:  This is a 81 year old male who has a PMH of atrial fibrillation on coumadin, CAD with stent in 2006, DM, HTN, HLD.  Had colonoscopies in the past with Dr. Cristina Gong with last in 12/2008 at which time he was found to have one polyp that was removed and was a tubular adenoma on pathology; repeat recommended in 5 years from that time.  He says that Dr. Cristina Gong is no longer in network with his insurance so he could not return there.  He says that his cardiac issues are stable.  Had one episode of rectal bleeding a couple of weeks ago and was seen by PCP, Dr. Birdie Riddle, who examined him without any apparent findings and referred him here since he needed colonoscopy anyway.  Rectal bleeding resolved after that one episode and has not recurred since that time.  No other GI complaints.   Past Medical History  Diagnosis Date  . Spinal stenosis 01/20/05  . CAD (coronary artery disease)   . Hyperlipidemia   . Hypertension   . Arthritis 12/2005    left hand  . Neuropathy     with pain  . Diabetes mellitus    Past Surgical History  Procedure Laterality Date  . Back surgery    . Coronary angioplasty with stent placement    . Cataract extraction  08/11/09    right  . Corneal endothelial Left 2013    reports that he has never smoked. He does not have any smokeless tobacco history on file. He reports that he does not drink alcohol or use illicit drugs. family history includes Heart failure in his father and mother. Allergies  Allergen Reactions  . Neomycin-Bacitracin Zn-Polymyx Rash  . Penicillins Other (See Comments)    Pt states this was diagnosed years ago, unsure as to reaction. Will not use.       Outpatient Encounter Prescriptions as of 06/12/2015  Medication Sig  . ACCU-CHEK FASTCLIX LANCETS MISC 1 Device by Does not apply route daily.  [name] Kitchen atorvastatin (LIPITOR) 40 MG tablet Take 1 tablet (40 mg total) by mouth at  bedtime.  . Calcium Carbonate-Vitamin D (CALCIUM 600 + D PO) Take 1 tablet by mouth 2 (two) times daily.   . cetirizine (ZYRTEC) 5 MG tablet Take 5 mg by mouth daily.  . diclofenac sodium (VOLTAREN) 1 % GEL Apply 4 g topically 4 (four) times daily.  . enalapril (VASOTEC) 5 MG tablet TAKE ONE TABLET BY MOUTH ONCE DAILY *PATIENT  NEEDS  APPOINTMENT  FOR  FURTHER  REFILLS*  . fish oil-omega-3 fatty acids 1000 MG capsule Take 4 g by mouth daily.   . Fluocinolone Acetonide 0.01 % OIL Place 5 drops in ear(s) 2 (two) times daily. For 7-10 days.  Repeat as needed  . glucose blood (ACCU-CHEK AVIVA PLUS) test strip USE STRIP TO CHECK GLUCOSE TWICE DAILY  . metFORMIN (GLUCOPHAGE) 1000 MG tablet TAKE ONE TABLET BY MOUTH TWICE DAILY  . metoprolol (LOPRESSOR) 100 MG tablet TAKE ONE TABLET BY MOUTH TWICE DAILY  . mometasone (ELOCON) 0.1 % cream Apply 1 application topically daily.  . Multiple Vitamin (MULTIVITAMIN) capsule Take 1 capsule by mouth daily.    . prednisoLONE acetate (PRED FORTE) 1 % ophthalmic suspension   . pregabalin (LYRICA) 50 MG capsule Take 50 mg by mouth at bedtime.    [name] Kitchen VIGAMOX 0.5 % ophthalmic solution   . warfarin (COUMADIN) 5 MG  tablet Take as directed by coumadin clinic  . Na Sulfate-K Sulfate-Mg Sulf (SUPREP BOWEL PREP) SOLN Take 1 kit by mouth once.   No facility-administered encounter medications on file as of 06/12/2015.     REVIEW OF SYSTEMS  : All other systems reviewed and negative except where noted in the History of Present Illness.   PHYSICAL EXAM: BP 118/68 mmHg  Pulse 70  Ht 5' 9"  (1.753 m)  Wt 240 lb 12.8 oz (109.226 kg)  BMI 35.54 kg/m2 General: Well developed white male in no acute distress Head: Normocephalic and atraumatic Eyes:  Sclerae anicteric, conjunctiva pink. Ears: Normal auditory acuity Lungs: Clear throughout to auscultation Heart: Regular rate and rhythm Abdomen: Soft, non-distended.  Normal bowel sounds.  Non-tender. Rectal:  Will be done at  the time of colonoscopy. Musculoskeletal: Symmetrical with no gross deformities  Skin: No lesions on visible extremities Extremities: No edema  Neurological: Alert oriented x 4, grossly non-focal Psychological:  Alert and cooperative. Normal mood and affect  ASSESSMENT AND PLAN: -Previous history of colon polyps:  Last colonoscopy 12/2008 with adenomatous polyp. -Chronic anticoagulation with coumadin:  Has been on this for several years for atrial fibrillation.  Will hold coumadin for 5 days prior to endoscopic procedures - will instruct when and how to resume after procedure. Benefits and risks of procedure explained including risks of bleeding, perforation, infection, missed lesions, reactions to medications and possible need for hospitalization and surgery for complications. Additional rare but real risk of stroke or other vascular clotting events off of coumadin also explained and need to seek urgent help if any signs of these problems occur. Will communicate by phone or EMR with patient's prescribing provider, Dr. Marlou Porch, to confirm that holding coumadin is reasonable in this case.    CC:  [name], MD  Addendum: Reviewed and agree with initial management. Jerene Bears, MD

## 2015-06-12 NOTE — Patient Instructions (Signed)
You have been scheduled for a colonoscopy. Please follow written instructions given to you at your visit today.  Please pick up your prep supplies at the pharmacy within the next 1-3 days. If you use inhalers (even only as needed), please bring them with you on the day of your procedure. Your physician has requested that you go to www.startemmi.com and enter the access code given to you at your visit today. This web site gives a general overview about your procedure. However, you should still follow specific instructions given to you by our office regarding your preparation for the procedure.  You will be contaced by our office prior to your procedure for directions on holding your Coumadin/Warfarin.  If you do not hear from our office 1 week prior to your scheduled procedure, please call [phone] to discuss. 

## 2015-06-12 NOTE — Telephone Encounter (Signed)
He may hold anticoagulation, warfarin for 5 days prior to endoscopic procedure.  Candee Furbish, MD

## 2015-06-26 ENCOUNTER — Ambulatory Visit (AMBULATORY_SURGERY_CENTER): Payer: Medicare HMO | Admitting: Internal Medicine

## 2015-06-26 ENCOUNTER — Encounter: Payer: Self-pay | Admitting: Internal Medicine

## 2015-06-26 VITALS — BP 113/50 | HR 67 | Temp 97.2°F | Resp 16 | Ht 69.0 in | Wt 240.0 lb

## 2015-06-26 DIAGNOSIS — Z86010 Personal history of colonic polyps: Secondary | ICD-10-CM | POA: Diagnosis not present

## 2015-06-26 DIAGNOSIS — D123 Benign neoplasm of transverse colon: Secondary | ICD-10-CM

## 2015-06-26 DIAGNOSIS — D124 Benign neoplasm of descending colon: Secondary | ICD-10-CM | POA: Diagnosis not present

## 2015-06-26 DIAGNOSIS — D120 Benign neoplasm of cecum: Secondary | ICD-10-CM

## 2015-06-26 DIAGNOSIS — D125 Benign neoplasm of sigmoid colon: Secondary | ICD-10-CM

## 2015-06-26 LAB — GLUCOSE, CAPILLARY
Glucose-Capillary: 116 mg/dL — ABNORMAL HIGH (ref 65–99)
Glucose-Capillary: 109 mg/dL — ABNORMAL HIGH (ref 65–99)

## 2015-06-26 MED ORDER — SODIUM CHLORIDE 0.9 % IV SOLN: 500.0000 mL | INTRAVENOUS | Status: DC

## 2015-06-26 NOTE — Progress Notes (Signed)
Transferred to recovery room. A/O x3, pleased with MAC.  VSS.  Report to Karen, RN. 

## 2015-06-26 NOTE — Progress Notes (Signed)
Called to room to assist during endoscopic procedure.  Patient ID and intended procedure confirmed with present staff. Received instructions for my participation in the procedure from the performing physician.  

## 2015-06-26 NOTE — Op Note (Signed)
[facility]  [address] [place], [address]   COLONOSCOPY PROCEDURE REPORT  PATIENT: Marvin Foster, Marvin Foster  MR#: [id number] BIRTHDATE: May 17, 1934 , 81  yrs. old GENDER: male ENDOSCOPIST: Jerene Bears, MD REFERRED [id number] Wadie Lessen, M.D. PROCEDURE DATE:  06/26/2015 PROCEDURE:   Colonoscopy, surveillance , Colonoscopy with snare polypectomy, and Colonoscopy with cold biopsy polypectomy First Screening Colonoscopy - Avg.  risk and is 50 yrs.  old or older - No.  Prior Negative Screening - Now for repeat screening. N/A  History of Adenoma - Now for follow-up colonoscopy & has been > or = to 3 yrs.  Yes hx of adenoma.  Has been 3 or more years since last colonoscopy.  Polyps removed today? Yes ASA CLASS:   Class III INDICATIONS:Surveillance due to prior colonic neoplasia and PH Colon Adenoma (last colon 2010 Dr. Cristina Gong). MEDICATIONS: Monitored anesthesia care and Propofol 300 mg IV  DESCRIPTION OF PROCEDURE:   After the risks benefits and alternatives of the procedure were thoroughly explained, informed consent was obtained.  The digital rectal exam revealed no rectal mass.   The LB [id number]  endoscope was introduced through the anus and advanced to the cecum, which was identified by both the appendix and ileocecal valve. No adverse events experienced. The quality of the prep was good.  (Suprep was used)  The instrument was then slowly withdrawn as the colon was fully examined. Estimated blood loss is zero unless otherwise noted in this procedure report.   COLON FINDINGS: Nine sessile polyps ranging between 3-70mm in size were found at the cecum (1), in the transverse colon (5), sigmoid colon (1), and descending colon (2).  Polypectomies were performed with a cold snare (8) and with cold forceps (1).  The resection was complete, the polyp tissue was completely retrieved and sent to histology.   There was mild diverticulosis noted in the left  colon. Retroflexed views revealed no abnormalities. The time to cecum = 1.0 Withdrawal time = 17.3   The scope was withdrawn and the procedure completed. COMPLICATIONS: There were no immediate complications.  ENDOSCOPIC IMPRESSION: 1.   Nine sessile polyps ranging between 3-34mm in size were found at the cecum, in the transverse colon, sigmoid colon, and descending colon; polypectomies were performed with a cold snare and with cold forceps 2.   Mild diverticulosis was noted in the left colon  RECOMMENDATIONS: 1.  Await pathology results 2.  High fiber diet 3.  Given your age, you will likely not need another colonoscopy for colon cancer screening or polyp surveillance.  These types of tests usually stop around age 52, though this decision will be left to you and your primary care doctor in 3 years.  eSigned:  Jerene Bears, MD 06/26/2015 8:33 AM cc: The Patient and Annye Asa MD

## 2015-06-26 NOTE — Patient Instructions (Signed)
RESTART YOUR COUMADIN TONIGHT.    YOU HAD AN ENDOSCOPIC PROCEDURE TODAY AT [facility]:   Refer to the procedure report that was given to you for any specific questions about what was found during the examination.  If the procedure report does not answer your questions, please call your gastroenterologist to clarify.  If you requested that your care partner not be given the details of your procedure findings, then the procedure report has been included in a sealed envelope for you to review at your convenience later.  YOU SHOULD EXPECT: Some feelings of bloating in the abdomen. Passage of more gas than usual.  Walking can help get rid of the air that was put into your GI tract during the procedure and reduce the bloating. If you had a lower endoscopy (such as a colonoscopy or flexible sigmoidoscopy) you may notice spotting of blood in your stool or on the toilet paper. If you underwent a bowel prep for your procedure, you may not have a normal bowel movement for a few days.  Please Note:  You might notice some irritation and congestion in your nose or some drainage.  This is from the oxygen used during your procedure.  There is no need for concern and it should clear up in a day or so.  SYMPTOMS TO REPORT IMMEDIATELY:   Following lower endoscopy (colonoscopy or flexible sigmoidoscopy):  Excessive amounts of blood in the stool  Significant tenderness or worsening of abdominal pains  Swelling of the abdomen that is new, acute  Fever of 100F or higher   For urgent or emergent issues, a gastroenterologist can be reached at any hour by calling [phone].   DIET: Your first meal following the procedure should be a small meal and then it is ok to progress to your normal diet. Heavy or fried foods are harder to digest and may make you feel nauseous or bloated.  Likewise, meals heavy in dairy and vegetables can increase bloating.  Drink plenty of fluids but you should avoid  alcoholic beverages for 24 hours.  ACTIVITY:  You should plan to take it easy for the rest of today and you should NOT DRIVE or use heavy machinery until tomorrow (because of the sedation medicines used during the test).    FOLLOW UP: Our staff will call the number listed on your records the next business day following your procedure to check on you and address any questions or concerns that you may have regarding the information given to you following your procedure. If we do not reach you, we will leave a message.  However, if you are feeling well and you are not experiencing any problems, there is no need to return our call.  We will assume that you have returned to your regular daily activities without incident.  If any biopsies were taken you will be contacted by phone or by letter within the next 1-3 weeks.  Please call us at [phone] if you have not heard about the biopsies in 3 weeks.    SIGNATURES/CONFIDENTIALITY: You and/or your care partner have signed paperwork which will be entered into your electronic medical record.  These signatures attest to the fact that that the information above on your After Visit Summary has been reviewed and is understood.  Full responsibility of the confidentiality of this discharge information lies with you and/or your care-partner.

## 2015-06-27 ENCOUNTER — Telehealth: Payer: Self-pay | Admitting: *Deleted

## 2015-06-27 NOTE — Telephone Encounter (Signed)
  Follow up Call-  Call back number 06/26/2015  Post procedure Call Back phone  # [phone]  Permission to leave phone message Yes     Patient questions:  Do you have a fever, pain , or abdominal swelling? No. Pain Score  0 *  Have you tolerated food without any problems? Yes.    Have you been able to return to your normal activities? Yes.    Do you have any questions about your discharge instructions: Diet   No. Medications  No. Follow up visit  No.  Do you have questions or concerns about your Care? No.  Actions: * If pain score is 4 or above: No action needed, pain <4.

## 2015-07-04 ENCOUNTER — Other Ambulatory Visit: Payer: Self-pay | Admitting: Cardiology

## 2015-07-08 ENCOUNTER — Encounter: Payer: Self-pay | Admitting: Internal Medicine

## 2015-07-09 ENCOUNTER — Ambulatory Visit (INDEPENDENT_AMBULATORY_CARE_PROVIDER_SITE_OTHER): Payer: Medicare HMO | Admitting: Pharmacist

## 2015-07-09 DIAGNOSIS — Z5181 Encounter for therapeutic drug level monitoring: Secondary | ICD-10-CM | POA: Diagnosis not present

## 2015-07-09 DIAGNOSIS — Z7901 Long term (current) use of anticoagulants: Secondary | ICD-10-CM | POA: Diagnosis not present

## 2015-07-09 DIAGNOSIS — I4891 Unspecified atrial fibrillation: Secondary | ICD-10-CM | POA: Diagnosis not present

## 2015-07-09 LAB — POCT INR: INR: 1.4

## 2015-07-14 DIAGNOSIS — M7061 Trochanteric bursitis, right hip: Secondary | ICD-10-CM | POA: Diagnosis not present

## 2015-07-14 DIAGNOSIS — M1712 Unilateral primary osteoarthritis, left knee: Secondary | ICD-10-CM | POA: Diagnosis not present

## 2015-07-17 DIAGNOSIS — M7061 Trochanteric bursitis, right hip: Secondary | ICD-10-CM | POA: Diagnosis not present

## 2015-07-18 ENCOUNTER — Ambulatory Visit (INDEPENDENT_AMBULATORY_CARE_PROVIDER_SITE_OTHER): Payer: Medicare HMO | Admitting: *Deleted

## 2015-07-18 DIAGNOSIS — Z7901 Long term (current) use of anticoagulants: Secondary | ICD-10-CM | POA: Diagnosis not present

## 2015-07-18 DIAGNOSIS — I4891 Unspecified atrial fibrillation: Secondary | ICD-10-CM

## 2015-07-18 DIAGNOSIS — Z5181 Encounter for therapeutic drug level monitoring: Secondary | ICD-10-CM

## 2015-07-18 LAB — POCT INR: INR: 2.1

## 2015-07-22 DIAGNOSIS — M1712 Unilateral primary osteoarthritis, left knee: Secondary | ICD-10-CM | POA: Diagnosis not present

## 2015-07-29 DIAGNOSIS — M1712 Unilateral primary osteoarthritis, left knee: Secondary | ICD-10-CM | POA: Diagnosis not present

## 2015-08-01 ENCOUNTER — Ambulatory Visit (INDEPENDENT_AMBULATORY_CARE_PROVIDER_SITE_OTHER): Payer: Medicare HMO | Admitting: *Deleted

## 2015-08-01 DIAGNOSIS — Z5181 Encounter for therapeutic drug level monitoring: Secondary | ICD-10-CM | POA: Diagnosis not present

## 2015-08-01 DIAGNOSIS — I4891 Unspecified atrial fibrillation: Secondary | ICD-10-CM | POA: Diagnosis not present

## 2015-08-01 DIAGNOSIS — Z7901 Long term (current) use of anticoagulants: Secondary | ICD-10-CM | POA: Diagnosis not present

## 2015-08-01 LAB — POCT INR: INR: 2

## 2015-08-05 ENCOUNTER — Ambulatory Visit (INDEPENDENT_AMBULATORY_CARE_PROVIDER_SITE_OTHER): Payer: Medicare HMO | Admitting: Cardiology

## 2015-08-05 ENCOUNTER — Encounter: Payer: Self-pay | Admitting: Cardiology

## 2015-08-05 VITALS — BP 126/74 | HR 69 | Ht 70.0 in | Wt 242.4 lb

## 2015-08-05 DIAGNOSIS — E1169 Type 2 diabetes mellitus with other specified complication: Secondary | ICD-10-CM | POA: Insufficient documentation

## 2015-08-05 DIAGNOSIS — I10 Essential (primary) hypertension: Secondary | ICD-10-CM | POA: Diagnosis not present

## 2015-08-05 DIAGNOSIS — I480 Paroxysmal atrial fibrillation: Secondary | ICD-10-CM | POA: Diagnosis not present

## 2015-08-05 DIAGNOSIS — E785 Hyperlipidemia, unspecified: Secondary | ICD-10-CM | POA: Diagnosis not present

## 2015-08-05 DIAGNOSIS — I2510 Atherosclerotic heart disease of native coronary artery without angina pectoris: Secondary | ICD-10-CM

## 2015-08-05 DIAGNOSIS — I2583 Coronary atherosclerosis due to lipid rich plaque: Principal | ICD-10-CM

## 2015-08-05 MED ORDER — ENALAPRIL MALEATE 5 MG PO TABS: 5.0000 mg | ORAL_TABLET | Freq: Every day | ORAL | Status: DC

## 2015-08-05 NOTE — Progress Notes (Addendum)
[name]. [address], [place]  [address] Phone: [phone] Fax:  [phone]  Date:  08/05/2015   ID:  [name], Foster August 11, 1934, MRN [id number]  PCP:  Annye Asa, MD   History of Present Illness: Marvin Foster is a 81 y.o. male with hx of coronary artery disease, diabetes, hyperlipidemia, and atrial fibrillation ( has been maintaining NSR) He has been doing very well, no complaints. No shortness of breath, no chest pain, no exertional anginal symptoms. 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 He is currently Coumadin for atrial fibrillation. Metoprolol tartrate to 100 mg BID. He states he is doing well on the therapy without any symptoms of atrial fibrillation, nor med side effects.  Recent EKGs have been normal rhythm.  Crop walk almost made it he states. No anginal symptoms. Felt some fatigue well holding the hand of his daughter.   Wt Readings from Last 3 Encounters:  08/05/15 242 lb 6.4 oz (109.952 kg)  06/26/15 240 lb (108.863 kg)  06/12/15 240 lb 12.8 oz (109.226 kg)     Past Medical History  Diagnosis Date  . Spinal stenosis 01/20/05  . CAD (coronary artery disease)   . Hyperlipidemia   . Hypertension   . Arthritis 12/2005    left hand  . Neuropathy ([place])     with pain  . Diabetes mellitus     Past Surgical History  Procedure Laterality Date  . Back surgery    . Coronary angioplasty with stent placement    . Cataract extraction  08/11/09    right  . Corneal endothelial Left 2013    Current Outpatient Prescriptions  Medication Sig Dispense Refill  . ACCU-CHEK FASTCLIX LANCETS MISC 1 Device by Does not apply route daily. 102 each 11  . atorvastatin (LIPITOR) 40 MG tablet Take 1 tablet (40 mg total) by mouth at bedtime. 90 tablet 1  . Calcium Carbonate-Vitamin D (CALCIUM 600 + D PO) Take 1 tablet by mouth 2 (two) times daily.     . enalapril (VASOTEC) 5 MG tablet Take 5 mg by  mouth daily.    . fish oil-omega-3 fatty acids 1000 MG capsule Take 4 g by mouth daily.     [name] Kitchen glucose blood (ACCU-CHEK AVIVA PLUS) test strip USE STRIP TO CHECK GLUCOSE TWICE DAILY 100 each 6  . metFORMIN (GLUCOPHAGE) 1000 MG tablet TAKE ONE TABLET BY MOUTH TWICE DAILY 180 tablet 1  . metoprolol (LOPRESSOR) 100 MG tablet TAKE ONE TABLET BY MOUTH TWICE DAILY 180 tablet 1  . Multiple Vitamin (MULTIVITAMIN) capsule Take 1 capsule by mouth daily.      . prednisoLONE acetate (PRED FORTE) 1 % ophthalmic suspension     . pregabalin (LYRICA) 50 MG capsule Take 50 mg by mouth at bedtime.      [name] Kitchen warfarin (COUMADIN) 5 MG tablet Take as directed by coumadin clinic 90 tablet 0  . cetirizine (ZYRTEC) 5 MG tablet Take 5 mg by mouth daily.    . diclofenac sodium (VOLTAREN) 1 % GEL Apply 4 g topically 4 (four) times daily. (Patient not taking: Reported on 06/26/2015) 100 g 3  . enalapril (VASOTEC) 5 MG tablet Take 1 tablet (5 mg total) by mouth daily. 14 tablet 0  . Fluocinolone Acetonide 0.01 % OIL Place 5 drops in ear(s) 2 (two) times daily. For 7-10 days.  Repeat as needed (Patient not taking: Reported on  06/26/2015) 20 mL 1  . mometasone (ELOCON) 0.1 % cream Apply 1 application topically daily. (Patient not taking: Reported on 06/26/2015) 15 g 0  . VIGAMOX 0.5 % ophthalmic solution Place 1 drop into the left eye daily.      No current facility-administered medications for this visit.    Allergies:    Allergies  Allergen Reactions  . Neomycin-Bacitracin Zn-Polymyx Rash  . Penicillins Other (See Comments)    Pt states this was diagnosed years ago, unsure as to reaction. Will not use.     Social History:  The patient  reports that he has never smoked. He does not have any smokeless tobacco history on file. He reports that he does not drink alcohol or use illicit drugs.   ROS:  Please see the history of present illness.   Denies any syncope, bleeding, orthopnea, PND    PHYSICAL EXAM: VS:  BP 126/74 mmHg   Pulse 69  Ht 5\' 10"  (1.778 m)  Wt 242 lb 6.4 oz (109.952 kg)  BMI 34.78 kg/m2 Well nourished, well developed, in no acute distress HEENT: normal Neck: no JVD Cardiac:  normal S1, S2; RRR; no murmur Lungs:  clear to auscultation bilaterally, no wheezing, rhonchi or rales Abd: soft, nontender, no hepatomegaly Ext: no edema Skin: warm and dry Neuro: no focal abnormalities noted  EKG:    Today10/25/16 - 69, sinus rhythm, no other abnormalities personally viewed-priorSinus rhythm rate 80 with no other changes.     ASSESSMENT AND PLAN:  1. Coronary artery disease-currently stable, no exertional anginal symptoms, doing well. Proximal LAD 03/01/2005 stent. Stress test 2006 no ischemia 2. Atrial fibrillation-has been maintaining sinus rhythm for quite some time now. We will continue with anticoagulation nonetheless because of his risk of atrial fibrillation/stroke. Only on metoprolol currently well controlled. Currently sinus rhythm on EKG. 3. Hyperlipidemia-atorvastatin. Goal LDL less than 70. 09/13/13, LDL 51. 4.  chronic anticoagulation -doing very well on warfarin. 5.  essential hypertension-currently well controlled. Medications reviewed. 6.  Diabetes- medications reviewed. Per Dr. Birdie Riddle 7. Obesity- continue to work on weight loss.  Signed, Candee Furbish, MD [facility]  08/05/2015 4:31 PM

## 2015-08-05 NOTE — Patient Instructions (Signed)
Medication Instructions:  The current medical regimen is effective;  continue present plan and medications.  Follow-Up: Follow up in 1 year with Dr. Marlou Porch.  You will receive a letter in the mail 2 months before you are due.  Please call us when you receive this letter to schedule your follow up appointment.  Thank you for choosing [facility]!!     If you need a refill on your cardiac medications before your next appointment, please call your pharmacy.

## 2015-08-13 NOTE — Addendum Note (Signed)
Addended by: Freada Bergeron on: 08/13/2015 05:21 PM   Modules accepted: Orders

## 2015-08-21 DIAGNOSIS — H1851 Endothelial corneal dystrophy: Secondary | ICD-10-CM | POA: Diagnosis not present

## 2015-08-21 DIAGNOSIS — H35371 Puckering of macula, right eye: Secondary | ICD-10-CM | POA: Diagnosis not present

## 2015-08-21 DIAGNOSIS — H469 Unspecified optic neuritis: Secondary | ICD-10-CM | POA: Diagnosis not present

## 2015-08-21 DIAGNOSIS — Z961 Presence of intraocular lens: Secondary | ICD-10-CM | POA: Diagnosis not present

## 2015-08-25 ENCOUNTER — Telehealth: Payer: Self-pay | Admitting: Cardiology

## 2015-08-25 DIAGNOSIS — S8001XA Contusion of right knee, initial encounter: Secondary | ICD-10-CM | POA: Diagnosis not present

## 2015-08-25 DIAGNOSIS — S76311A Strain of muscle, fascia and tendon of the posterior muscle group at thigh level, right thigh, initial encounter: Secondary | ICD-10-CM | POA: Diagnosis not present

## 2015-08-25 DIAGNOSIS — M25561 Pain in right knee: Secondary | ICD-10-CM | POA: Diagnosis not present

## 2015-08-25 DIAGNOSIS — M1712 Unilateral primary osteoarthritis, left knee: Secondary | ICD-10-CM | POA: Diagnosis not present

## 2015-08-25 NOTE — Telephone Encounter (Signed)
New Message  Pt c/o medication issue:  4. What is your medication issue? Pt called req a call back to discuss if there is an anti inflammatory that he can take with coumadin

## 2015-08-26 ENCOUNTER — Encounter: Payer: Medicare HMO | Admitting: Internal Medicine

## 2015-08-26 NOTE — Telephone Encounter (Signed)
Pt called back regarding anti-inflammatory drugs and Coumadin. Advised patient that if he is having chronic pain that Tylenol is a better option with his Coumadin. Let him know that if he needs Aleve or Advil to help with inflammation in particular to limit its use to only a few days and to make sure he takes it with food. Pt expressed understanding.

## 2015-08-26 NOTE — Telephone Encounter (Signed)
Left message to call coumadin clinic regarding question regarding taking anti inflammatory

## 2015-09-02 DIAGNOSIS — S8001XD Contusion of right knee, subsequent encounter: Secondary | ICD-10-CM | POA: Diagnosis not present

## 2015-09-03 ENCOUNTER — Ambulatory Visit (INDEPENDENT_AMBULATORY_CARE_PROVIDER_SITE_OTHER): Payer: Medicare HMO | Admitting: *Deleted

## 2015-09-03 DIAGNOSIS — Z7901 Long term (current) use of anticoagulants: Secondary | ICD-10-CM

## 2015-09-03 DIAGNOSIS — Z5181 Encounter for therapeutic drug level monitoring: Secondary | ICD-10-CM | POA: Diagnosis not present

## 2015-09-03 DIAGNOSIS — I4891 Unspecified atrial fibrillation: Secondary | ICD-10-CM | POA: Diagnosis not present

## 2015-09-03 DIAGNOSIS — I480 Paroxysmal atrial fibrillation: Secondary | ICD-10-CM | POA: Diagnosis not present

## 2015-09-03 LAB — POCT INR: INR: 1.5

## 2015-09-08 DIAGNOSIS — S8001XD Contusion of right knee, subsequent encounter: Secondary | ICD-10-CM | POA: Diagnosis not present

## 2015-09-10 ENCOUNTER — Ambulatory Visit (INDEPENDENT_AMBULATORY_CARE_PROVIDER_SITE_OTHER): Payer: Medicare HMO | Admitting: *Deleted

## 2015-09-10 DIAGNOSIS — Z7901 Long term (current) use of anticoagulants: Secondary | ICD-10-CM | POA: Diagnosis not present

## 2015-09-10 DIAGNOSIS — Z5181 Encounter for therapeutic drug level monitoring: Secondary | ICD-10-CM

## 2015-09-10 DIAGNOSIS — I480 Paroxysmal atrial fibrillation: Secondary | ICD-10-CM | POA: Diagnosis not present

## 2015-09-10 DIAGNOSIS — I4891 Unspecified atrial fibrillation: Secondary | ICD-10-CM

## 2015-09-10 LAB — POCT INR: INR: 2.4

## 2015-09-12 DIAGNOSIS — H469 Unspecified optic neuritis: Secondary | ICD-10-CM | POA: Diagnosis not present

## 2015-09-15 ENCOUNTER — Other Ambulatory Visit: Payer: Self-pay | Admitting: Family Medicine

## 2015-09-16 DIAGNOSIS — H1851 Endothelial corneal dystrophy: Secondary | ICD-10-CM | POA: Diagnosis not present

## 2015-09-16 DIAGNOSIS — H35371 Puckering of macula, right eye: Secondary | ICD-10-CM | POA: Diagnosis not present

## 2015-09-16 DIAGNOSIS — Z961 Presence of intraocular lens: Secondary | ICD-10-CM | POA: Diagnosis not present

## 2015-09-16 DIAGNOSIS — H469 Unspecified optic neuritis: Secondary | ICD-10-CM | POA: Diagnosis not present

## 2015-09-16 NOTE — Telephone Encounter (Signed)
Medication filled to pharmacy as requested.   

## 2015-09-24 ENCOUNTER — Telehealth: Payer: Self-pay | Admitting: Behavioral Health

## 2015-09-24 ENCOUNTER — Other Ambulatory Visit: Payer: Self-pay | Admitting: Family Medicine

## 2015-09-24 NOTE — Telephone Encounter (Signed)
Confirmed appointment for Medicare Wellness Visit, 09/25/15 at 1:00 PM w/ Mariane Duval, RN.

## 2015-09-25 ENCOUNTER — Ambulatory Visit (INDEPENDENT_AMBULATORY_CARE_PROVIDER_SITE_OTHER): Payer: Medicare HMO

## 2015-09-25 VITALS — BP 118/58 | HR 76 | Ht 70.0 in | Wt 240.4 lb

## 2015-09-25 DIAGNOSIS — Z0000 Encounter for general adult medical examination without abnormal findings: Secondary | ICD-10-CM

## 2015-09-25 NOTE — Patient Instructions (Addendum)
Eat healthy diet (full of fruits, vegetables, whole grains, lean protein, water--limit salt, fat, and sugar intake) and increase physical activity as tolerated.  Watch your portions as discussed during visit.  Continue doing brain stimulating activities (puzzles, reading, adult coloring books, staying active) to keep memory sharp.  Keep appointment with Dr. Birdie Riddle as scheduled.     Fall Prevention in the Home  Falls can cause injuries. They can happen to people of all ages. There are many things you can do to make your home safe and to help prevent falls.  WHAT CAN I DO ON THE OUTSIDE OF MY HOME?  Regularly fix the edges of walkways and driveways and fix any cracks.  Remove anything that might make you trip as you walk through a door, such as a raised step or threshold.  Trim any bushes or trees on the path to your home.  Use bright outdoor lighting.  Clear any walking paths of anything that might make someone trip, such as rocks or tools.  Regularly check to see if handrails are loose or broken. Make sure that both sides of any steps have handrails.  Any raised decks and porches should have guardrails on the edges.  Have any leaves, snow, or ice cleared regularly.  Use sand or salt on walking paths during winter.  Clean up any spills in your garage right away. This includes oil or grease spills. WHAT CAN I DO IN THE BATHROOM?   Use night lights.  Install grab bars by the toilet and in the tub and shower. Do not use towel bars as grab bars.  Use non-skid mats or decals in the tub or shower.  If you need to sit down in the shower, use a plastic, non-slip stool.  Keep the floor dry. Clean up any water that spills on the floor as soon as it happens.  Remove soap buildup in the tub or shower regularly.  Attach bath mats securely with double-sided non-slip rug tape.  Do not have throw rugs and other things on the floor that can make you trip. WHAT CAN I DO IN THE  BEDROOM?  Use night lights.  Make sure that you have a light by your bed that is easy to reach.  Do not use any sheets or blankets that are too big for your bed. They should not hang down onto the floor.  Have a firm chair that has side arms. You can use this for support while you get dressed.  Do not have throw rugs and other things on the floor that can make you trip. WHAT CAN I DO IN THE KITCHEN?  Clean up any spills right away.  Avoid walking on wet floors.  Keep items that you use a lot in easy-to-reach places.  If you need to reach something above you, use a strong step stool that has a grab bar.  Keep electrical cords out of the way.  Do not use floor polish or wax that makes floors slippery. If you must use wax, use non-skid floor wax.  Do not have throw rugs and other things on the floor that can make you trip. WHAT CAN I DO WITH MY STAIRS?  Do not leave any items on the stairs.  Make sure that there are handrails on both sides of the stairs and use them. Fix handrails that are broken or loose. Make sure that handrails are as long as the stairways.  Check any carpeting to make sure that it is firmly  attached to the stairs. Fix any carpet that is loose or worn.  Avoid having throw rugs at the top or bottom of the stairs. If you do have throw rugs, attach them to the floor with carpet tape.  Make sure that you have a light switch at the top of the stairs and the bottom of the stairs. If you do not have them, ask someone to add them for you. WHAT ELSE CAN I DO TO HELP PREVENT FALLS?  Wear shoes that:  Do not have high heels.  Have rubber bottoms.  Are comfortable and fit you well.  Are closed at the toe. Do not wear sandals.  If you use a stepladder:  Make sure that it is fully opened. Do not climb a closed stepladder.  Make sure that both sides of the stepladder are locked into place.  Ask someone to hold it for you, if possible.  Clearly mark and make  sure that you can see:  Any grab bars or handrails.  First and last steps.  Where the edge of each step is.  Use tools that help you move around (mobility aids) if they are needed. These include:  Canes.  Walkers.  Scooters.  Crutches.  Turn on the lights when you go into a dark area. Replace any light bulbs as soon as they burn out.  Set up your furniture so you have a clear path. Avoid moving your furniture around.  If any of your floors are uneven, fix them.  If there are any pets around you, be aware of where they are.  Review your medicines with your doctor. Some medicines can make you feel dizzy. This can increase your chance of falling. Ask your doctor what other things that you can do to help prevent falls.   This information is not intended to replace advice given to you by your health care provider. Make sure you discuss any questions you have with your health care provider.   Document Released: 07/24/2009 Document Revised: 02/11/2015 Document Reviewed: 11/01/2014 Elsevier Interactive Patient Education [employer].

## 2015-09-25 NOTE — Telephone Encounter (Signed)
Medication filled to pharmacy as requested.   

## 2015-09-25 NOTE — Progress Notes (Signed)
   Subjective:    Patient ID: Marvin Foster, male    DOB: 05/01/1934, 81 y.o.   MRN: [id number]  HPI Agree w/ documentation as provided by RN   Review of Systems To be done at time of CPE    Objective:   Physical Exam To be done at time of CPE       Assessment & Plan:  Follow up for CPE

## 2015-09-25 NOTE — Progress Notes (Signed)
Pre visit review using our clinic review tool, if applicable. No additional management support is needed unless otherwise documented below in the visit note. 

## 2015-09-25 NOTE — Progress Notes (Addendum)
Subjective:   Marvin Foster is a 81 y.o. male who presents for Medicare Annual/Subsequent preventive examination.  Review of Systems: No ROS Cardiac Risk Factors include: male gender;advanced age (>12men, >46 women);family history of premature cardiovascular disease;hypertension;diabetes mellitus;obesity (BMI >30kg/m2);sedentary lifestyle Sleep patterns:  Sleep at least 5 hours per night/gets up at least twice during the night to void.    Home Safety/Smoke Alarms:  Feels safe at home.  Lives alone in one level home.  Smoke alarms presents.   Firearm Safety:  No firearms.   Seat Belt Safety/Bike Helmet:  Always wears seat belt. Sun exposure:  Limited sun exposure  Counseling:   Eye Exam- 11/2014-Dr. [facility]- Wears dentures.   Male:  CCS-06/26/15-9 polyps and mild diverticulosis--Dr. Pyrtle    PSA- 05/09/14-2.01   Immunization: UTD; Flu Vaccine received on 08/06/15 at [facility]     Objective:    Vitals: BP 118/58 mmHg  Pulse 76  Ht 5\' 10"  (1.778 m)  Wt 240 lb 6.4 oz (109.045 kg)  BMI 34.49 kg/m2  SpO2 97%  Tobacco History  Smoking status  . Never Smoker   Smokeless tobacco  . Not on file     Counseling given: No   Past Medical History  Diagnosis Date  . Spinal stenosis 01/20/05  . CAD (coronary artery disease)   . Hyperlipidemia   . Hypertension   . Arthritis 12/2005    left hand  . Neuropathy ([place])     with pain  . Diabetes mellitus   . Bursitis of knee     right knee  . Arthritis     Left knee  . Glaucoma     left eye   Past Surgical History  Procedure Laterality Date  . Back surgery    . Coronary angioplasty with stent placement    . Cataract extraction  08/11/09    right  . Corneal endothelial Left 2013  . Corneal transplant Left     eye   Family History  Problem Relation Age of Onset  . Heart failure Father   . Heart failure Mother    History  Sexual Activity  . Sexual Activity: Not on file    Outpatient Encounter Prescriptions  as of 09/25/2015  Medication Sig  . ACCU-CHEK FASTCLIX LANCETS MISC 1 Device by Does not apply route daily.  [name] Kitchen atorvastatin (LIPITOR) 40 MG tablet TAKE ONE TABLET BY MOUTH AT BEDTIME  . Calcium Carbonate-Vitamin D (CALCIUM 600 + D PO) Take 1 tablet by mouth 2 (two) times daily.   . cetirizine (ZYRTEC) 5 MG tablet Take 5 mg by mouth daily.  . enalapril (VASOTEC) 5 MG tablet Take 1 tablet (5 mg total) by mouth daily.  . fish oil-omega-3 fatty acids 1000 MG capsule Take 4 g by mouth daily.   [name] Kitchen glucose blood (ACCU-CHEK AVIVA PLUS) test strip USE STRIP TO CHECK GLUCOSE TWICE DAILY  . metFORMIN (GLUCOPHAGE) 1000 MG tablet TAKE ONE TABLET BY MOUTH TWICE DAILY  . metoprolol (LOPRESSOR) 100 MG tablet TAKE ONE TABLET BY MOUTH TWICE DAILY  . Multiple Vitamin (MULTIVITAMIN) capsule Take 1 capsule by mouth daily.    . prednisoLONE acetate (PRED FORTE) 1 % ophthalmic suspension   . pregabalin (LYRICA) 50 MG capsule Take 50 mg by mouth at bedtime.    [name] Kitchen VIGAMOX 0.5 % ophthalmic solution Place 1 drop into the left eye daily.   [name] Kitchen warfarin (COUMADIN) 5 MG tablet Take as directed by coumadin clinic  . diclofenac sodium (VOLTAREN)  1 % GEL Apply 4 g topically 4 (four) times daily. (Patient not taking: Reported on 09/25/2015)  . Fluocinolone Acetonide 0.01 % OIL Place 5 drops in ear(s) 2 (two) times daily. For 7-10 days.  Repeat as needed (Patient not taking: Reported on 09/25/2015)  . mometasone (ELOCON) 0.1 % cream Apply 1 application topically daily. (Patient not taking: Reported on 09/25/2015)   No facility-administered encounter medications on file as of 09/25/2015.    Activities of Daily Living In your present state of health, do you have any difficulty performing the following activities: 09/25/2015 01/16/2015  Hearing? N Y  Vision? Y Y  Difficulty concentrating or making decisions? N N  Walking or climbing stairs? Y N  Dressing or bathing? N N  Doing errands, shopping? N N  Preparing Food and eating ?  N -  Using the Toilet? N -  In the past six months, have you accidently leaked urine? N -  Do you have problems with loss of bowel control? N -  Managing your Medications? N -  Managing your Finances? N -  Housekeeping or managing your Housekeeping? N -    Patient Care Team: [name], MD as PCP - General Jerline Pain, MD as Consulting Physician (Cardiology) Monna Fam, MD as Consulting Physician (Ophthalmology)   Assessment:   Exercise Activities and Dietary recommendations Current Exercise Habits:: The patient does not participate in regular exercise at present (Pt still works.  Gets very little exercise, if any really.  )   Diet:  Mostly cooked foods. Eats 2-3 meals per day.  Regular diet.  Limits bread intake.//Discussed reading food labels and portion control during visit.    Goals    . Improve my diet     Portion control        Fall Risk Fall Risk  09/25/2015 05/09/2014 03/01/2013  Falls in the past year? Yes No Yes  Number falls in past yr: 2 or more - 2 or more  Injury with Fall? No - -  Risk Factor Category  High Fall Risk - High Fall Risk  Risk for fall due to : Impaired mobility - -  Risk for fall due to (comments): Decreased strenght in legs, bilateral knee pain - -  Follow up Education provided;Falls prevention discussed - -   Depression Screen PHQ 2/9 Scores 09/25/2015 05/09/2014 03/01/2013 02/28/2012  PHQ - 2 Score 0 0 0 0    Cognitive Testing MMSE - Mini Mental State Exam 09/25/2015  Orientation to time 5  Orientation to Place 5  Registration 3  Attention/ Calculation 5  Recall 2  Language- name 2 objects 2  Language- repeat 1  Language- follow 3 step command 3  Language- read & follow direction 1  Write a sentence 1  Copy design 1  Total score 29    Immunization History  Administered Date(s) Administered  . Influenza Whole 08/14/2007, 07/23/2009, 07/10/2010  . Influenza,inj,Quad PF,36+ Mos 08/14/2013, 08/13/2014  .  Influenza-Unspecified 08/06/2015  . Pneumococcal Conjugate-13 01/16/2015  . Pneumococcal Polysaccharide-23 12/22/2009  . Tdap 08/28/2014  . Zoster 08/20/2010   Screening Tests Health Maintenance  Topic Date Due  . HEMOGLOBIN A1C  11/07/2015  . OPHTHALMOLOGY EXAM  12/04/2015  . FOOT EXAM  01/16/2016  . INFLUENZA VACCINE  05/11/2016  . COLONOSCOPY  06/25/2018  . TETANUS/TDAP  08/28/2024  . ZOSTAVAX  Completed  . PNA vac Low Risk Adult  Completed    Plan:  Eat healthy diet (full of fruits, vegetables,  whole grains, lean protein, water--limit salt, fat, and sugar intake) and increase physical activity as tolerated.  Watch your portions as discussed during visit.    Continue doing brain stimulating activities (puzzles, reading, adult coloring books, staying active) to keep memory sharp.   Keep appointment with Dr. Birdie Riddle as scheduled.     During the course of the visit the patient was educated and counseled about the following appropriate screening and preventive services:   Vaccines to include Pneumoccal, Influenza, Hepatitis B, Td, Zostavax, HCV  Electrocardiogram  Cardiovascular Disease  Colorectal cancer screening  Diabetes screening  Prostate Cancer Screening  Glaucoma screening  Nutrition counseling   Smoking cessation counseling  Patient Instructions (the written plan) was given to the patient.    Rudene Anda, RN  09/25/2015

## 2015-09-26 ENCOUNTER — Ambulatory Visit (INDEPENDENT_AMBULATORY_CARE_PROVIDER_SITE_OTHER): Payer: Self-pay | Admitting: Ophthalmology

## 2015-09-30 DIAGNOSIS — R69 Illness, unspecified: Secondary | ICD-10-CM | POA: Diagnosis not present

## 2015-10-01 ENCOUNTER — Ambulatory Visit (INDEPENDENT_AMBULATORY_CARE_PROVIDER_SITE_OTHER): Payer: Medicare HMO | Admitting: *Deleted

## 2015-10-01 DIAGNOSIS — Z5181 Encounter for therapeutic drug level monitoring: Secondary | ICD-10-CM | POA: Diagnosis not present

## 2015-10-01 DIAGNOSIS — I4891 Unspecified atrial fibrillation: Secondary | ICD-10-CM

## 2015-10-01 DIAGNOSIS — I480 Paroxysmal atrial fibrillation: Secondary | ICD-10-CM | POA: Diagnosis not present

## 2015-10-01 DIAGNOSIS — Z7901 Long term (current) use of anticoagulants: Secondary | ICD-10-CM | POA: Diagnosis not present

## 2015-10-01 LAB — POCT INR: INR: 2.8

## 2015-10-08 ENCOUNTER — Telehealth: Payer: Self-pay

## 2015-10-08 NOTE — Telephone Encounter (Signed)
Left message for patient to call back regarding pre- visit information. 

## 2015-10-09 ENCOUNTER — Encounter: Payer: Self-pay | Admitting: Family Medicine

## 2015-10-09 ENCOUNTER — Ambulatory Visit (INDEPENDENT_AMBULATORY_CARE_PROVIDER_SITE_OTHER): Payer: Medicare HMO | Admitting: Family Medicine

## 2015-10-09 VITALS — BP 100/70 | HR 83 | Temp 97.8°F | Resp 14 | Ht 70.0 in | Wt 231.4 lb

## 2015-10-09 DIAGNOSIS — I480 Paroxysmal atrial fibrillation: Secondary | ICD-10-CM

## 2015-10-09 DIAGNOSIS — Z125 Encounter for screening for malignant neoplasm of prostate: Secondary | ICD-10-CM

## 2015-10-09 DIAGNOSIS — Z0000 Encounter for general adult medical examination without abnormal findings: Secondary | ICD-10-CM | POA: Diagnosis not present

## 2015-10-09 DIAGNOSIS — E785 Hyperlipidemia, unspecified: Secondary | ICD-10-CM

## 2015-10-09 DIAGNOSIS — E1139 Type 2 diabetes mellitus with other diabetic ophthalmic complication: Secondary | ICD-10-CM

## 2015-10-09 LAB — CBC WITH DIFFERENTIAL/PLATELET
Basophils Absolute: 0 10*3/uL (ref 0.0–0.1)
Basophils Relative: 0.4 % (ref 0.0–3.0)
Eosinophils Absolute: 0.2 10*3/uL (ref 0.0–0.7)
Eosinophils Relative: 2.1 % (ref 0.0–5.0)
HCT: 41.5 % (ref 39.0–52.0)
Hemoglobin: 13.9 g/dL (ref 13.0–17.0)
Lymphocytes Relative: 27.9 % (ref 12.0–46.0)
Lymphs Abs: 2.3 10*3/uL (ref 0.7–4.0)
MCHC: 33.6 g/dL (ref 30.0–36.0)
MCV: 93.4 fl (ref 78.0–100.0)
Monocytes Absolute: 0.5 10*3/uL (ref 0.1–1.0)
Monocytes Relative: 6.5 % (ref 3.0–12.0)
Neutro Abs: 5.3 10*3/uL (ref 1.4–7.7)
Neutrophils Relative %: 63.1 % (ref 43.0–77.0)
Platelets: 179 10*3/uL (ref 150.0–400.0)
RBC: 4.44 Mil/uL (ref 4.22–5.81)
RDW: 14.2 % (ref 11.5–15.5)
WBC: 8.4 10*3/uL (ref 4.0–10.5)

## 2015-10-09 LAB — BASIC METABOLIC PANEL
BUN: 28 mg/dL — ABNORMAL HIGH (ref 6–23)
CO2: 26 mEq/L (ref 19–32)
Calcium: 9.5 mg/dL (ref 8.4–10.5)
Chloride: 104 mEq/L (ref 96–112)
Creatinine, Ser: 1.3 mg/dL (ref 0.40–1.50)
GFR: 56.48 mL/min — ABNORMAL LOW (ref >60.00)
Glucose, Bld: 114 mg/dL — ABNORMAL HIGH (ref 70–99)
Potassium: 4.7 mEq/L (ref 3.5–5.1)
Sodium: 142 mEq/L (ref 135–145)

## 2015-10-09 LAB — HEPATIC FUNCTION PANEL
ALT: 30 U/L (ref 0–53)
AST: 41 U/L — ABNORMAL HIGH (ref 0–37)
Albumin: 4.5 g/dL (ref 3.5–5.2)
Alkaline Phosphatase: 50 U/L (ref 39–117)
Bilirubin, Direct: 0.3 mg/dL (ref 0.0–0.3)
Total Bilirubin: 1.1 mg/dL (ref 0.2–1.2)
Total Protein: 7.4 g/dL (ref 6.0–8.3)

## 2015-10-09 LAB — PSA, MEDICARE: PSA: 2.03 ng/ml (ref 0.10–4.00)

## 2015-10-09 LAB — LIPID PANEL
Cholesterol: 121 mg/dL (ref 0–200)
HDL: 33.9 mg/dL — ABNORMAL LOW (ref >39.00)
LDL Cholesterol: 52 mg/dL (ref 0–99)
NonHDL: 87.16
Total CHOL/HDL Ratio: 4
Triglycerides: 177 mg/dL — ABNORMAL HIGH (ref 0.0–149.0)
VLDL: 35.4 mg/dL (ref 0.0–40.0)

## 2015-10-09 LAB — TSH: TSH: 4.56 u[IU]/mL — ABNORMAL HIGH (ref 0.35–4.50)

## 2015-10-09 LAB — HEMOGLOBIN A1C: Hgb A1c MFr Bld: 6.7 % — ABNORMAL HIGH (ref 4.6–6.5)

## 2015-10-09 NOTE — Progress Notes (Signed)
Pre visit review using our clinic review tool, if applicable. No additional management support is needed unless otherwise documented below in the visit note. 

## 2015-10-09 NOTE — Patient Instructions (Signed)
Follow up in 3-4 months to recheck diabetes We'll notify you of your lab results and make any change if needed Continue to work on healthy diet and regular activity as you are able You are up to date on colonoscopy- yay!! Call with any questions or concerns If you want to join [place] at the new [place] office, any scheduled appointments will automatically transfer and we will see you at [address] [place] [address] [place], [place], [place] [address] ([place]) [place]!!!

## 2015-10-09 NOTE — Progress Notes (Signed)
   Subjective:    Patient ID: Marvin Foster, male    DOB: 1933-11-18, 81 y.o.   MRN: [id number]  HPI CPE- pt had annual wellness visit w/ RN 09/25/15.  UTD on colonoscopy, eye exam, foot exam.  Following w/ Dr Marlou Porch for Afib- on coumadin.  Due for PSA.   Review of Systems Patient reports no hearing changes, anorexia, fever ,adenopathy, persistant/recurrent hoarseness, swallowing issues, chest pain, palpitations, edema, persistant/recurrent cough, hemoptysis, dyspnea (rest,exertional, paroxysmal nocturnal), gastrointestinal  bleeding (melena, rectal bleeding), abdominal pain, excessive heart burn, GU symptoms (dysuria, hematuria, voiding/incontinence issues) syncope, focal weakness, memory loss, numbness & tingling, skin/hair/nail changes, depression, anxiety, abnormal bruising/bleeding.   + vision changes- following w/ Dr Herbert Deaner and [facility] + bilateral knee pain    Objective:   Physical Exam General Appearance:    Alert, cooperative, no distress, appears stated age, obese  Head:    Normocephalic, without obvious abnormality, atraumatic  Eyes:    PERRL, conjunctiva/corneas clear, EOM's intact, fundi    benign, both eyes       Ears:    Normal TM's and external ear canals, both ears  Nose:   Nares normal, septum midline, mucosa normal, no drainage   or sinus tenderness  Throat:   Lips, mucosa, and tongue normal; teeth and gums normal  Neck:   Supple, symmetrical, trachea midline, no adenopathy;       thyroid:  No enlargement/tenderness/nodules  Back:     Symmetric, no curvature, ROM normal, no CVA tenderness  Lungs:     Clear to auscultation bilaterally, respirations unlabored  Chest wall:    No tenderness or deformity  Heart:    Irregular S1 and S2, no murmur, rub   or gallop  Abdomen:     Soft, non-tender, bowel sounds active all four quadrants,    no masses, no organomegaly  Genitalia:    Deferred at pt's request  Rectal:    Extremities:   Extremities normal, atraumatic, no  cyanosis or edema  Pulses:   2+ and symmetric all extremities  Skin:   Skin color, texture, turgor normal, no rashes or lesions  Lymph nodes:   Cervical, supraclavicular, and axillary nodes normal  Neurologic:   CNII-XII intact. Normal strength, sensation and reflexes      throughout          Assessment & Plan:

## 2015-10-10 ENCOUNTER — Other Ambulatory Visit: Payer: Self-pay | Admitting: General Practice

## 2015-10-10 DIAGNOSIS — E038 Other specified hypothyroidism: Secondary | ICD-10-CM

## 2015-10-10 MED ORDER — LEVOTHYROXINE SODIUM 50 MCG PO TABS: 50.0000 ug | ORAL_TABLET | Freq: Every day | ORAL | Status: DC

## 2015-10-13 NOTE — Assessment & Plan Note (Signed)
Chronic problem.  UTD on foot exam, eye exam.  On ACE for renal protection.  Check labs.  Adjust meds prn  

## 2015-10-13 NOTE — Assessment & Plan Note (Signed)
Chronic problem, tolerating statin w/o difficulty.  Check labs.  Adjust meds prn  

## 2015-10-13 NOTE — Assessment & Plan Note (Signed)
Chronic problem, following w/ Dr Marlou Porch.  On Coumadin for anticoagulation.

## 2015-10-13 NOTE — Assessment & Plan Note (Signed)
Pt's PE unchanged from previous.  UTD on colonoscopy.  Due for PSA.  UTD on immunizations.  Check labs.  Anticipatory guidance provided.

## 2015-10-15 ENCOUNTER — Ambulatory Visit (INDEPENDENT_AMBULATORY_CARE_PROVIDER_SITE_OTHER): Payer: Self-pay | Admitting: Ophthalmology

## 2015-10-24 ENCOUNTER — Other Ambulatory Visit: Payer: Self-pay | Admitting: *Deleted

## 2015-10-24 MED ORDER — WARFARIN SODIUM 5 MG PO TABS: ORAL_TABLET | ORAL | Status: DC

## 2015-10-24 NOTE — Telephone Encounter (Signed)
Per voicemail, patient would like a ninety day supply.

## 2015-10-29 ENCOUNTER — Ambulatory Visit (INDEPENDENT_AMBULATORY_CARE_PROVIDER_SITE_OTHER): Payer: Medicare HMO | Admitting: *Deleted

## 2015-10-29 DIAGNOSIS — Z7901 Long term (current) use of anticoagulants: Secondary | ICD-10-CM

## 2015-10-29 DIAGNOSIS — I4891 Unspecified atrial fibrillation: Secondary | ICD-10-CM | POA: Diagnosis not present

## 2015-10-29 DIAGNOSIS — Z5181 Encounter for therapeutic drug level monitoring: Secondary | ICD-10-CM | POA: Diagnosis not present

## 2015-10-29 DIAGNOSIS — I480 Paroxysmal atrial fibrillation: Secondary | ICD-10-CM | POA: Diagnosis not present

## 2015-10-29 LAB — POCT INR: INR: 1.8

## 2015-11-17 ENCOUNTER — Ambulatory Visit (INDEPENDENT_AMBULATORY_CARE_PROVIDER_SITE_OTHER): Payer: Medicare HMO | Admitting: Ophthalmology

## 2015-11-17 DIAGNOSIS — H353131 Nonexudative age-related macular degeneration, bilateral, early dry stage: Secondary | ICD-10-CM | POA: Diagnosis not present

## 2015-11-17 DIAGNOSIS — E113293 Type 2 diabetes mellitus with mild nonproliferative diabetic retinopathy without macular edema, bilateral: Secondary | ICD-10-CM | POA: Diagnosis not present

## 2015-11-17 DIAGNOSIS — E11319 Type 2 diabetes mellitus with unspecified diabetic retinopathy without macular edema: Secondary | ICD-10-CM | POA: Diagnosis not present

## 2015-11-17 DIAGNOSIS — I10 Essential (primary) hypertension: Secondary | ICD-10-CM

## 2015-11-17 DIAGNOSIS — H35371 Puckering of macula, right eye: Secondary | ICD-10-CM | POA: Diagnosis not present

## 2015-11-17 DIAGNOSIS — H43813 Vitreous degeneration, bilateral: Secondary | ICD-10-CM

## 2015-11-17 LAB — HM DIABETES EYE EXAM

## 2015-11-18 ENCOUNTER — Encounter: Payer: Self-pay | Admitting: General Practice

## 2015-11-19 ENCOUNTER — Ambulatory Visit (INDEPENDENT_AMBULATORY_CARE_PROVIDER_SITE_OTHER): Payer: Medicare HMO | Admitting: Pharmacist

## 2015-11-19 ENCOUNTER — Ambulatory Visit (INDEPENDENT_AMBULATORY_CARE_PROVIDER_SITE_OTHER): Payer: Self-pay | Admitting: Ophthalmology

## 2015-11-19 DIAGNOSIS — Z5181 Encounter for therapeutic drug level monitoring: Secondary | ICD-10-CM | POA: Diagnosis not present

## 2015-11-19 DIAGNOSIS — I480 Paroxysmal atrial fibrillation: Secondary | ICD-10-CM | POA: Diagnosis not present

## 2015-11-19 DIAGNOSIS — I4891 Unspecified atrial fibrillation: Secondary | ICD-10-CM

## 2015-11-19 DIAGNOSIS — Z7901 Long term (current) use of anticoagulants: Secondary | ICD-10-CM | POA: Diagnosis not present

## 2015-11-19 LAB — POCT INR: INR: 2.2

## 2015-12-08 DIAGNOSIS — M5416 Radiculopathy, lumbar region: Secondary | ICD-10-CM | POA: Diagnosis not present

## 2015-12-17 ENCOUNTER — Ambulatory Visit (INDEPENDENT_AMBULATORY_CARE_PROVIDER_SITE_OTHER): Payer: Medicare HMO | Admitting: *Deleted

## 2015-12-17 DIAGNOSIS — Z5181 Encounter for therapeutic drug level monitoring: Secondary | ICD-10-CM

## 2015-12-17 DIAGNOSIS — I480 Paroxysmal atrial fibrillation: Secondary | ICD-10-CM | POA: Diagnosis not present

## 2015-12-17 DIAGNOSIS — I4891 Unspecified atrial fibrillation: Secondary | ICD-10-CM

## 2015-12-17 DIAGNOSIS — Z7901 Long term (current) use of anticoagulants: Secondary | ICD-10-CM | POA: Diagnosis not present

## 2015-12-17 LAB — POCT INR: INR: 1.9

## 2015-12-24 DIAGNOSIS — H1851 Endothelial corneal dystrophy: Secondary | ICD-10-CM | POA: Diagnosis not present

## 2015-12-29 DIAGNOSIS — M5416 Radiculopathy, lumbar region: Secondary | ICD-10-CM | POA: Diagnosis not present

## 2015-12-29 DIAGNOSIS — Z6834 Body mass index (BMI) 34.0-34.9, adult: Secondary | ICD-10-CM | POA: Diagnosis not present

## 2015-12-31 ENCOUNTER — Other Ambulatory Visit: Payer: Self-pay | Admitting: Neurosurgery

## 2015-12-31 DIAGNOSIS — M5416 Radiculopathy, lumbar region: Secondary | ICD-10-CM

## 2016-01-07 ENCOUNTER — Encounter: Payer: Self-pay | Admitting: Cardiology

## 2016-01-07 ENCOUNTER — Ambulatory Visit (INDEPENDENT_AMBULATORY_CARE_PROVIDER_SITE_OTHER): Payer: Medicare HMO | Admitting: *Deleted

## 2016-01-07 DIAGNOSIS — Z7901 Long term (current) use of anticoagulants: Secondary | ICD-10-CM | POA: Diagnosis not present

## 2016-01-07 DIAGNOSIS — I480 Paroxysmal atrial fibrillation: Secondary | ICD-10-CM | POA: Diagnosis not present

## 2016-01-07 DIAGNOSIS — I4891 Unspecified atrial fibrillation: Secondary | ICD-10-CM

## 2016-01-07 DIAGNOSIS — Z5181 Encounter for therapeutic drug level monitoring: Secondary | ICD-10-CM

## 2016-01-07 LAB — POCT INR: INR: 2.2

## 2016-01-07 NOTE — Telephone Encounter (Signed)
This encounter was created in error - please disregard.

## 2016-01-08 ENCOUNTER — Ambulatory Visit
Admission: RE | Admit: 2016-01-08 | Discharge: 2016-01-08 | Disposition: A | Discharge status: Home / Self Care | Payer: Medicare HMO | Source: Ambulatory Visit | Attending: Neurosurgery | Admitting: Neurosurgery

## 2016-01-08 DIAGNOSIS — M4806 Spinal stenosis, lumbar region: Secondary | ICD-10-CM | POA: Diagnosis not present

## 2016-01-08 DIAGNOSIS — M5416 Radiculopathy, lumbar region: Secondary | ICD-10-CM

## 2016-01-08 MED ORDER — GADOBENATE DIMEGLUMINE 529 MG/ML IV SOLN
20.0000 mL | Freq: Once | INTRAVENOUS | Status: AC | PRN
  Administered 2016-01-08: 20 mL via INTRAVENOUS

## 2016-01-16 DIAGNOSIS — Z6834 Body mass index (BMI) 34.0-34.9, adult: Secondary | ICD-10-CM | POA: Diagnosis not present

## 2016-01-16 DIAGNOSIS — M5416 Radiculopathy, lumbar region: Secondary | ICD-10-CM | POA: Diagnosis not present

## 2016-01-17 ENCOUNTER — Other Ambulatory Visit: Payer: Self-pay | Admitting: Family Medicine

## 2016-01-19 ENCOUNTER — Telehealth: Payer: Self-pay | Admitting: Family Medicine

## 2016-01-19 DIAGNOSIS — R69 Illness, unspecified: Secondary | ICD-10-CM | POA: Diagnosis not present

## 2016-01-19 MED ORDER — GLUCOSE BLOOD VI STRP: ORAL_STRIP | Status: DC

## 2016-01-19 NOTE — Telephone Encounter (Signed)
Relation to [id number] Call back number:[phone] Pharmacy: [facility] [place], [place]. [phone] (Phone) [phone] (Fax)         Reason for call:  Patient requesting a refill glucose blood (ACCU-CHEK AVIVA PLUS) test strip

## 2016-01-19 NOTE — Telephone Encounter (Signed)
Medication filled to pharmacy as requested.   

## 2016-01-22 DIAGNOSIS — Z7901 Long term (current) use of anticoagulants: Secondary | ICD-10-CM | POA: Diagnosis not present

## 2016-01-22 DIAGNOSIS — I2510 Atherosclerotic heart disease of native coronary artery without angina pectoris: Secondary | ICD-10-CM | POA: Diagnosis not present

## 2016-01-22 DIAGNOSIS — H1851 Endothelial corneal dystrophy: Secondary | ICD-10-CM | POA: Diagnosis not present

## 2016-01-22 DIAGNOSIS — E119 Type 2 diabetes mellitus without complications: Secondary | ICD-10-CM | POA: Diagnosis not present

## 2016-01-22 DIAGNOSIS — I4891 Unspecified atrial fibrillation: Secondary | ICD-10-CM | POA: Diagnosis not present

## 2016-01-22 DIAGNOSIS — Z7982 Long term (current) use of aspirin: Secondary | ICD-10-CM | POA: Diagnosis not present

## 2016-01-22 DIAGNOSIS — E785 Hyperlipidemia, unspecified: Secondary | ICD-10-CM | POA: Diagnosis not present

## 2016-01-22 DIAGNOSIS — I10 Essential (primary) hypertension: Secondary | ICD-10-CM | POA: Diagnosis not present

## 2016-01-22 DIAGNOSIS — M545 Low back pain: Secondary | ICD-10-CM | POA: Diagnosis not present

## 2016-01-22 DIAGNOSIS — Z955 Presence of coronary angioplasty implant and graft: Secondary | ICD-10-CM | POA: Diagnosis not present

## 2016-01-28 DIAGNOSIS — M5416 Radiculopathy, lumbar region: Secondary | ICD-10-CM | POA: Diagnosis not present

## 2016-01-29 ENCOUNTER — Ambulatory Visit: Payer: Medicare HMO | Admitting: Family Medicine

## 2016-02-02 ENCOUNTER — Encounter: Payer: Self-pay | Admitting: Family Medicine

## 2016-02-02 ENCOUNTER — Ambulatory Visit (INDEPENDENT_AMBULATORY_CARE_PROVIDER_SITE_OTHER): Payer: Medicare HMO | Admitting: Family Medicine

## 2016-02-02 VITALS — BP 118/73 | HR 81 | Temp 98.1°F | Resp 16 | Ht 70.0 in | Wt 226.4 lb

## 2016-02-02 DIAGNOSIS — E1139 Type 2 diabetes mellitus with other diabetic ophthalmic complication: Secondary | ICD-10-CM | POA: Diagnosis not present

## 2016-02-02 LAB — BASIC METABOLIC PANEL
BUN: 17 mg/dL (ref 6–23)
CO2: 27 mEq/L (ref 19–32)
Calcium: 9.4 mg/dL (ref 8.4–10.5)
Chloride: 102 mEq/L (ref 96–112)
Creatinine, Ser: 1.04 mg/dL (ref 0.40–1.50)
GFR: 73.01 mL/min (ref >60.00)
Glucose, Bld: 121 mg/dL — ABNORMAL HIGH (ref 70–99)
Potassium: 4.9 mEq/L (ref 3.5–5.1)
Sodium: 137 mEq/L (ref 135–145)

## 2016-02-02 LAB — HEMOGLOBIN A1C: Hgb A1c MFr Bld: 7 % — ABNORMAL HIGH (ref 4.6–6.5)

## 2016-02-02 NOTE — Progress Notes (Signed)
Pre visit review using our clinic review tool, if applicable. No additional management support is needed unless otherwise documented below in the visit note. 

## 2016-02-02 NOTE — Patient Instructions (Signed)
Follow up in 3-4 months to recheck diabetes, cholesterol, and BP We'll notify you of your lab results and make any changes if needed Continue to work on healthy diet and regular exercise- you look great! We'll call you with your podiatry appt Call with any questions or concerns Thanks for sticking with [place]! Happy Spring!!!

## 2016-02-02 NOTE — Assessment & Plan Note (Signed)
Chronic problem.  Doing well on Metformin.  Sugars were high on prednisone for recent back pain which may reflect in today's A1C.  UTD on eye exam.  Referral placed for foot exam.  Currently asymptomatic.  Check labs.  Adjust meds prn

## 2016-02-02 NOTE — Progress Notes (Deleted)
Pre visit review using our clinic review tool, if applicable. No additional management support is needed unless otherwise documented below in the visit note. 

## 2016-02-02 NOTE — Progress Notes (Signed)
   Subjective:    Patient ID: Marvin Foster, male    DOB: 1934/06/28, 82 y.o.   MRN: [id number]  HPI DM- chronic problem, on Metformin twice daily.  On ACE for renal protection.  UTD on eye exam- had cornea transplant at [place] earlier this month.  Due for foot exam.  Pt had to take prednisone for back pain which caused sugars to spike.  Sugars have returned to normal since finishing prednisone- running 120-150s.  No CP, SOB, HAs, abd pain, N/V/D, numbness/tingling above baseline.  Denies symptomatic lows.  Has not seen podiatry recently- needs referral to Dr Inocencio Homes.  Pt has lost 5 lbs since last visit.   Review of Systems For ROS see HPI     Objective:   Physical Exam  Constitutional: He is oriented to person, place, and time. He appears well-developed and well-nourished. No distress.  HENT:  Head: Normocephalic and atraumatic.  Eyes: Conjunctivae and EOM are normal. Pupils are equal, round, and reactive to light.  Neck: Normal range of motion. Neck supple. No thyromegaly present.  Cardiovascular: Normal rate, regular rhythm, normal heart sounds and intact distal pulses.   No murmur heard. Pulmonary/Chest: Effort normal and breath sounds normal. No respiratory distress.  Abdominal: Soft. Bowel sounds are normal. He exhibits no distension.  Musculoskeletal: He exhibits no edema.  Lymphadenopathy:    He has no cervical adenopathy.  Neurological: He is alert and oriented to person, place, and time. No cranial nerve deficit.  Skin: Skin is warm and dry.  Psychiatric: He has a normal mood and affect. His behavior is normal.  Vitals reviewed.         Assessment & Plan:

## 2016-02-03 DIAGNOSIS — I2510 Atherosclerotic heart disease of native coronary artery without angina pectoris: Secondary | ICD-10-CM | POA: Diagnosis not present

## 2016-02-03 DIAGNOSIS — E785 Hyperlipidemia, unspecified: Secondary | ICD-10-CM | POA: Diagnosis not present

## 2016-02-03 DIAGNOSIS — E1142 Type 2 diabetes mellitus with diabetic polyneuropathy: Secondary | ICD-10-CM | POA: Diagnosis not present

## 2016-02-03 DIAGNOSIS — I482 Chronic atrial fibrillation: Secondary | ICD-10-CM | POA: Diagnosis not present

## 2016-02-03 DIAGNOSIS — Z0000 Encounter for general adult medical examination without abnormal findings: Secondary | ICD-10-CM | POA: Diagnosis not present

## 2016-02-03 DIAGNOSIS — I10 Essential (primary) hypertension: Secondary | ICD-10-CM | POA: Diagnosis not present

## 2016-02-04 ENCOUNTER — Ambulatory Visit (INDEPENDENT_AMBULATORY_CARE_PROVIDER_SITE_OTHER): Payer: Medicare HMO | Admitting: *Deleted

## 2016-02-04 DIAGNOSIS — I480 Paroxysmal atrial fibrillation: Secondary | ICD-10-CM | POA: Diagnosis not present

## 2016-02-04 DIAGNOSIS — Z5181 Encounter for therapeutic drug level monitoring: Secondary | ICD-10-CM

## 2016-02-04 DIAGNOSIS — I4891 Unspecified atrial fibrillation: Secondary | ICD-10-CM

## 2016-02-04 DIAGNOSIS — Z7901 Long term (current) use of anticoagulants: Secondary | ICD-10-CM

## 2016-02-04 LAB — POCT INR: INR: 3.1

## 2016-02-12 DIAGNOSIS — L603 Nail dystrophy: Secondary | ICD-10-CM | POA: Diagnosis not present

## 2016-02-12 DIAGNOSIS — E1142 Type 2 diabetes mellitus with diabetic polyneuropathy: Secondary | ICD-10-CM | POA: Diagnosis not present

## 2016-02-12 DIAGNOSIS — L84 Corns and callosities: Secondary | ICD-10-CM | POA: Diagnosis not present

## 2016-02-12 DIAGNOSIS — I739 Peripheral vascular disease, unspecified: Secondary | ICD-10-CM | POA: Diagnosis not present

## 2016-02-12 DIAGNOSIS — M79672 Pain in left foot: Secondary | ICD-10-CM | POA: Diagnosis not present

## 2016-02-12 DIAGNOSIS — M79671 Pain in right foot: Secondary | ICD-10-CM | POA: Diagnosis not present

## 2016-02-12 LAB — HM DIABETES FOOT EXAM

## 2016-02-23 ENCOUNTER — Telehealth: Payer: Self-pay | Admitting: Family Medicine

## 2016-02-23 DIAGNOSIS — E1139 Type 2 diabetes mellitus with other diabetic ophthalmic complication: Secondary | ICD-10-CM

## 2016-02-23 NOTE — Telephone Encounter (Signed)
We typically recommend an 81mg  ASA daily.  If he has 50mg  he can take 2 until he needs to buy more

## 2016-02-23 NOTE — Telephone Encounter (Signed)
Spoke with patient,please advise on the dose of the aspirin?  Pt was upset with podiatry appt, states that all they did was trim his nails. They could not even write him an Rx for new orthotic shoes. Papers printed for [facility] and placed for PCP to complete. Pt will call [facility] in the morning to schedule an appt with them.

## 2016-02-23 NOTE — Telephone Encounter (Signed)
Patient notified of PCP recommendations and is agreement and expresses an understanding.  

## 2016-02-23 NOTE — Telephone Encounter (Signed)
Pt requesting a call back for the following reasons:  1. States he has been taking baby aspirin daily for several years and did not realize there were multiple doses available.  Patient states he has been taking .5mg  instead of .8mg .  He would like to know if he should he take two of the .5mg  or if he should continue taking the aspirin daily as he has been.   2. To discuss the outcome of his appointment with podiatrist he was referred to.

## 2016-02-24 NOTE — Telephone Encounter (Signed)
Please advise, do you know of any other companies that will accept medicare for diabetic shoes, pt states that he called [facility] and they do not accept his insurance?

## 2016-02-24 NOTE — Telephone Encounter (Signed)
We could try the [facility] (locations in both [place] and [place])

## 2016-02-25 NOTE — Telephone Encounter (Signed)
Spoke with [facility], DME sent as requested and they will fax back orthotic shoe packet for completion.

## 2016-03-03 ENCOUNTER — Ambulatory Visit (INDEPENDENT_AMBULATORY_CARE_PROVIDER_SITE_OTHER): Payer: Medicare HMO | Admitting: *Deleted

## 2016-03-03 DIAGNOSIS — Z5181 Encounter for therapeutic drug level monitoring: Secondary | ICD-10-CM

## 2016-03-03 DIAGNOSIS — Z7901 Long term (current) use of anticoagulants: Secondary | ICD-10-CM | POA: Diagnosis not present

## 2016-03-03 DIAGNOSIS — I480 Paroxysmal atrial fibrillation: Secondary | ICD-10-CM

## 2016-03-03 DIAGNOSIS — I4891 Unspecified atrial fibrillation: Secondary | ICD-10-CM

## 2016-03-03 LAB — POCT INR: INR: 3.1

## 2016-03-09 ENCOUNTER — Other Ambulatory Visit: Payer: Self-pay | Admitting: Family Medicine

## 2016-03-09 NOTE — Telephone Encounter (Signed)
Medication filled to pharmacy as requested.   

## 2016-03-15 ENCOUNTER — Encounter: Payer: Self-pay | Admitting: General Practice

## 2016-03-24 ENCOUNTER — Ambulatory Visit (INDEPENDENT_AMBULATORY_CARE_PROVIDER_SITE_OTHER): Payer: Medicare HMO | Admitting: *Deleted

## 2016-03-24 DIAGNOSIS — Z5181 Encounter for therapeutic drug level monitoring: Secondary | ICD-10-CM

## 2016-03-24 DIAGNOSIS — Z7901 Long term (current) use of anticoagulants: Secondary | ICD-10-CM | POA: Diagnosis not present

## 2016-03-24 DIAGNOSIS — I4891 Unspecified atrial fibrillation: Secondary | ICD-10-CM | POA: Diagnosis not present

## 2016-03-24 DIAGNOSIS — I480 Paroxysmal atrial fibrillation: Secondary | ICD-10-CM | POA: Diagnosis not present

## 2016-03-24 LAB — POCT INR: INR: 1.9

## 2016-03-31 DIAGNOSIS — H903 Sensorineural hearing loss, bilateral: Secondary | ICD-10-CM | POA: Diagnosis not present

## 2016-03-31 DIAGNOSIS — H6121 Impacted cerumen, right ear: Secondary | ICD-10-CM | POA: Diagnosis not present

## 2016-03-31 DIAGNOSIS — J3489 Other specified disorders of nose and nasal sinuses: Secondary | ICD-10-CM | POA: Diagnosis not present

## 2016-04-05 ENCOUNTER — Telehealth: Payer: Self-pay | Admitting: General Practice

## 2016-04-05 NOTE — Telephone Encounter (Signed)
Paperwork received form [facility] for Diabetic Shoes. Paperwork completed and faxed.

## 2016-04-08 DIAGNOSIS — E1142 Type 2 diabetes mellitus with diabetic polyneuropathy: Secondary | ICD-10-CM | POA: Diagnosis not present

## 2016-04-14 ENCOUNTER — Ambulatory Visit (INDEPENDENT_AMBULATORY_CARE_PROVIDER_SITE_OTHER): Payer: Medicare HMO | Admitting: *Deleted

## 2016-04-14 DIAGNOSIS — Z7901 Long term (current) use of anticoagulants: Secondary | ICD-10-CM | POA: Diagnosis not present

## 2016-04-14 DIAGNOSIS — Z5181 Encounter for therapeutic drug level monitoring: Secondary | ICD-10-CM

## 2016-04-14 DIAGNOSIS — I480 Paroxysmal atrial fibrillation: Secondary | ICD-10-CM | POA: Diagnosis not present

## 2016-04-14 DIAGNOSIS — I4891 Unspecified atrial fibrillation: Secondary | ICD-10-CM | POA: Diagnosis not present

## 2016-04-14 LAB — POCT INR: INR: 2.6

## 2016-04-24 DIAGNOSIS — R69 Illness, unspecified: Secondary | ICD-10-CM | POA: Diagnosis not present

## 2016-05-05 ENCOUNTER — Ambulatory Visit (INDEPENDENT_AMBULATORY_CARE_PROVIDER_SITE_OTHER): Payer: Medicare HMO | Admitting: *Deleted

## 2016-05-05 DIAGNOSIS — I4891 Unspecified atrial fibrillation: Secondary | ICD-10-CM

## 2016-05-05 DIAGNOSIS — Z5181 Encounter for therapeutic drug level monitoring: Secondary | ICD-10-CM | POA: Diagnosis not present

## 2016-05-05 DIAGNOSIS — Z7901 Long term (current) use of anticoagulants: Secondary | ICD-10-CM | POA: Diagnosis not present

## 2016-05-05 LAB — POCT INR: INR: 2.7

## 2016-05-20 ENCOUNTER — Ambulatory Visit (INDEPENDENT_AMBULATORY_CARE_PROVIDER_SITE_OTHER): Payer: Medicare HMO | Admitting: Family Medicine

## 2016-05-20 ENCOUNTER — Encounter: Payer: Self-pay | Admitting: Family Medicine

## 2016-05-20 VITALS — BP 119/70 | HR 79 | Temp 98.0°F | Resp 16 | Ht 70.0 in | Wt 234.0 lb

## 2016-05-20 DIAGNOSIS — E1139 Type 2 diabetes mellitus with other diabetic ophthalmic complication: Secondary | ICD-10-CM | POA: Diagnosis not present

## 2016-05-20 DIAGNOSIS — I10 Essential (primary) hypertension: Secondary | ICD-10-CM

## 2016-05-20 DIAGNOSIS — E785 Hyperlipidemia, unspecified: Secondary | ICD-10-CM

## 2016-05-20 LAB — HEPATIC FUNCTION PANEL
ALT: 22 U/L (ref 0–53)
AST: 24 U/L (ref 0–37)
Albumin: 4.3 g/dL (ref 3.5–5.2)
Alkaline Phosphatase: 46 U/L (ref 39–117)
Bilirubin, Direct: 0.3 mg/dL (ref 0.0–0.3)
Total Bilirubin: 1 mg/dL (ref 0.2–1.2)
Total Protein: 6.9 g/dL (ref 6.0–8.3)

## 2016-05-20 LAB — CBC WITH DIFFERENTIAL/PLATELET
Basophils Absolute: 0 10*3/uL (ref 0.0–0.1)
Basophils Relative: 0.6 % (ref 0.0–3.0)
Eosinophils Absolute: 0.2 10*3/uL (ref 0.0–0.7)
Eosinophils Relative: 3.5 % (ref 0.0–5.0)
HCT: 40.6 % (ref 39.0–52.0)
Hemoglobin: 13.5 g/dL (ref 13.0–17.0)
Lymphocytes Relative: 32.7 % (ref 12.0–46.0)
Lymphs Abs: 2 10*3/uL (ref 0.7–4.0)
MCHC: 33.2 g/dL (ref 30.0–36.0)
MCV: 94.4 fl (ref 78.0–100.0)
Monocytes Absolute: 0.5 10*3/uL (ref 0.1–1.0)
Monocytes Relative: 8.3 % (ref 3.0–12.0)
Neutro Abs: 3.3 10*3/uL (ref 1.4–7.7)
Neutrophils Relative %: 54.9 % (ref 43.0–77.0)
Platelets: 141 10*3/uL — ABNORMAL LOW (ref 150.0–400.0)
RBC: 4.3 Mil/uL (ref 4.22–5.81)
RDW: 14.5 % (ref 11.5–15.5)
WBC: 6.1 10*3/uL (ref 4.0–10.5)

## 2016-05-20 LAB — BASIC METABOLIC PANEL
BUN: 16 mg/dL (ref 6–23)
CO2: 29 mEq/L (ref 19–32)
Calcium: 9.5 mg/dL (ref 8.4–10.5)
Chloride: 104 mEq/L (ref 96–112)
Creatinine, Ser: 1.05 mg/dL (ref 0.40–1.50)
GFR: 72.15 mL/min (ref >60.00)
Glucose, Bld: 145 mg/dL — ABNORMAL HIGH (ref 70–99)
Potassium: 5.3 mEq/L — ABNORMAL HIGH (ref 3.5–5.1)
Sodium: 140 mEq/L (ref 135–145)

## 2016-05-20 LAB — TSH: TSH: 5.21 u[IU]/mL — ABNORMAL HIGH (ref 0.35–4.50)

## 2016-05-20 LAB — LIPID PANEL
Cholesterol: 112 mg/dL (ref 0–200)
HDL: 34.9 mg/dL — ABNORMAL LOW (ref >39.00)
LDL Cholesterol: 53 mg/dL (ref 0–99)
NonHDL: 76.65
Total CHOL/HDL Ratio: 3
Triglycerides: 119 mg/dL (ref 0.0–149.0)
VLDL: 23.8 mg/dL (ref 0.0–40.0)

## 2016-05-20 LAB — HEMOGLOBIN A1C: Hgb A1c MFr Bld: 6.6 % — ABNORMAL HIGH (ref 4.6–6.5)

## 2016-05-20 NOTE — Progress Notes (Signed)
   Subjective:    Patient ID: Marvin Foster, male    DOB: Mar 20, 1934, 82 y.o.   MRN: [id number]  HPI HTN- chronic problem, on Enalapril, metoprolol w/ good BP control.  No CP, SOB, HAs, visual changes, edema.  DM- chronic problem, on Metformin.  On ACE for renal protection.  UTD on foot exam and eye exam.  No formal exercise but pt is working daily and staying busy.  Denies symptomatic lows.  Pt did have an elevated reading a few weeks ago- 218.  Next day was back to normal at 125.    Hyperlipidemia- chronic problem, on Lipitor, fish oil.  Denies abd pain, N/V, myalgias.   Review of Systems For ROS see HPI     Objective:   Physical Exam  Constitutional: He is oriented to person, place, and time. He appears well-developed and well-nourished. No distress.  HENT:  Head: Normocephalic and atraumatic.  Eyes: Conjunctivae and EOM are normal. Pupils are equal, round, and reactive to light.  Neck: Normal range of motion. Neck supple. No thyromegaly present.  Cardiovascular: Normal rate, regular rhythm, normal heart sounds and intact distal pulses.   No murmur heard. Pulmonary/Chest: Effort normal and breath sounds normal. No respiratory distress.  Abdominal: Soft. Bowel sounds are normal. He exhibits no distension.  Musculoskeletal: He exhibits no edema.  Lymphadenopathy:    He has no cervical adenopathy.  Neurological: He is alert and oriented to person, place, and time. No cranial nerve deficit.  Skin: Skin is warm and dry.  Psychiatric: He has a normal mood and affect. His behavior is normal.  Vitals reviewed.         Assessment & Plan:

## 2016-05-20 NOTE — Assessment & Plan Note (Signed)
Chronic problem.  Hx of good control on current medications.  On ACE for renal protection.  UTD on foot exam, eye exam.  Check labs.  Adjust meds prn

## 2016-05-20 NOTE — Assessment & Plan Note (Signed)
Chronic problem.  Tolerating statin w/o difficulty.  Check labs.  Adjust meds prn  

## 2016-05-20 NOTE — Assessment & Plan Note (Signed)
Chronic problem.  Well controlled.  Asymptomatic.  Check labs- no anticipated med changes.  Will continue to follow.

## 2016-05-20 NOTE — Progress Notes (Signed)
Pre visit review using our clinic review tool, if applicable. No additional management support is needed unless otherwise documented below in the visit note. 

## 2016-05-20 NOTE — Patient Instructions (Signed)
Follow up in 3-4 months to recheck diabetes We'll notify you of your lab results and make any changes if needed Continue to work on healthy diet and regular exercise- you can do it!!! You are up to date on your eye exam and foot exam- yay! Call with any questions or concerns Enjoy the rest of your summer!!!

## 2016-05-21 ENCOUNTER — Other Ambulatory Visit: Payer: Self-pay | Admitting: General Practice

## 2016-05-21 MED ORDER — LEVOTHYROXINE SODIUM 75 MCG PO TABS: 75.0000 ug | ORAL_TABLET | Freq: Every day | ORAL | 6 refills | Status: DC

## 2016-06-02 ENCOUNTER — Ambulatory Visit (INDEPENDENT_AMBULATORY_CARE_PROVIDER_SITE_OTHER): Payer: Medicare HMO | Admitting: *Deleted

## 2016-06-02 DIAGNOSIS — Z7901 Long term (current) use of anticoagulants: Secondary | ICD-10-CM

## 2016-06-02 DIAGNOSIS — Z5181 Encounter for therapeutic drug level monitoring: Secondary | ICD-10-CM | POA: Diagnosis not present

## 2016-06-02 DIAGNOSIS — I4891 Unspecified atrial fibrillation: Secondary | ICD-10-CM

## 2016-06-02 LAB — POCT INR: INR: 1.7

## 2016-06-23 ENCOUNTER — Ambulatory Visit (INDEPENDENT_AMBULATORY_CARE_PROVIDER_SITE_OTHER): Payer: Medicare HMO | Admitting: Pharmacist

## 2016-06-23 ENCOUNTER — Other Ambulatory Visit: Payer: Self-pay | Admitting: Cardiology

## 2016-06-23 DIAGNOSIS — Z5181 Encounter for therapeutic drug level monitoring: Secondary | ICD-10-CM

## 2016-06-23 DIAGNOSIS — I4891 Unspecified atrial fibrillation: Secondary | ICD-10-CM

## 2016-06-23 DIAGNOSIS — Z7901 Long term (current) use of anticoagulants: Secondary | ICD-10-CM

## 2016-06-23 LAB — POCT INR: INR: 2.7

## 2016-07-15 ENCOUNTER — Telehealth: Payer: Self-pay | Admitting: Family Medicine

## 2016-07-15 DIAGNOSIS — Z1283 Encounter for screening for malignant neoplasm of skin: Secondary | ICD-10-CM

## 2016-07-15 NOTE — Telephone Encounter (Signed)
Pt calling to request referral to dermatologist to have  spot on forehead evaluated. He and his family are concerned about this area on forehead.  (afternoon appt preferred)

## 2016-07-15 NOTE — Telephone Encounter (Signed)
Referral placed at pt request.

## 2016-07-20 DIAGNOSIS — Z947 Corneal transplant status: Secondary | ICD-10-CM | POA: Diagnosis not present

## 2016-07-20 DIAGNOSIS — H1851 Endothelial corneal dystrophy: Secondary | ICD-10-CM | POA: Diagnosis not present

## 2016-07-21 ENCOUNTER — Ambulatory Visit (INDEPENDENT_AMBULATORY_CARE_PROVIDER_SITE_OTHER): Payer: Medicare HMO | Admitting: *Deleted

## 2016-07-21 DIAGNOSIS — Z7901 Long term (current) use of anticoagulants: Secondary | ICD-10-CM | POA: Diagnosis not present

## 2016-07-21 DIAGNOSIS — I4891 Unspecified atrial fibrillation: Secondary | ICD-10-CM

## 2016-07-21 DIAGNOSIS — Z5181 Encounter for therapeutic drug level monitoring: Secondary | ICD-10-CM | POA: Diagnosis not present

## 2016-07-21 LAB — POCT INR: INR: 1.9

## 2016-07-29 ENCOUNTER — Encounter: Payer: Self-pay | Admitting: Cardiology

## 2016-07-30 DIAGNOSIS — R69 Illness, unspecified: Secondary | ICD-10-CM | POA: Diagnosis not present

## 2016-08-02 ENCOUNTER — Other Ambulatory Visit: Payer: Self-pay | Admitting: Cardiology

## 2016-08-02 DIAGNOSIS — R69 Illness, unspecified: Secondary | ICD-10-CM | POA: Diagnosis not present

## 2016-08-03 ENCOUNTER — Other Ambulatory Visit: Payer: Self-pay

## 2016-08-03 MED ORDER — ENALAPRIL MALEATE 5 MG PO TABS: 5.0000 mg | ORAL_TABLET | Freq: Every day | ORAL | 3 refills | Status: DC

## 2016-08-06 DIAGNOSIS — L820 Inflamed seborrheic keratosis: Secondary | ICD-10-CM | POA: Diagnosis not present

## 2016-08-12 ENCOUNTER — Ambulatory Visit (INDEPENDENT_AMBULATORY_CARE_PROVIDER_SITE_OTHER): Payer: Medicare HMO | Admitting: Cardiology

## 2016-08-12 ENCOUNTER — Ambulatory Visit (INDEPENDENT_AMBULATORY_CARE_PROVIDER_SITE_OTHER): Payer: Medicare HMO | Admitting: Pharmacist

## 2016-08-12 ENCOUNTER — Encounter: Payer: Self-pay | Admitting: Cardiology

## 2016-08-12 VITALS — BP 124/68 | HR 74 | Ht 70.0 in | Wt 239.0 lb

## 2016-08-12 DIAGNOSIS — I10 Essential (primary) hypertension: Secondary | ICD-10-CM | POA: Diagnosis not present

## 2016-08-12 DIAGNOSIS — Z7901 Long term (current) use of anticoagulants: Secondary | ICD-10-CM

## 2016-08-12 DIAGNOSIS — I4891 Unspecified atrial fibrillation: Secondary | ICD-10-CM

## 2016-08-12 DIAGNOSIS — Z5181 Encounter for therapeutic drug level monitoring: Secondary | ICD-10-CM

## 2016-08-12 LAB — POCT INR: INR: 2.7

## 2016-08-12 NOTE — Patient Instructions (Signed)
Medication Instructions:  The current medical regimen is effective;  continue present plan and medications.  Follow-Up: Follow up in 6 months with Miguel Rota, PA.  You will receive a letter in the mail 2 months before you are due.  Please call us when you receive this letter to schedule your follow up appointment.  Follow up in 1 year with Dr. Marlou Porch.  You will receive a letter in the mail 2 months before you are due.  Please call us when you receive this letter to schedule your follow up appointment.  If you need a refill on your cardiac medications before your next appointment, please call your pharmacy.  Thank you for choosing [facility]!!

## 2016-08-12 NOTE — Progress Notes (Signed)
[place]. [address] [place], [place]  [address] Phone: [phone] Fax:  [phone]  Date:  08/12/2016   ID:  Briar, Stpierre 1933-10-22, MRN [id number]  PCP:  Annye Asa, MD   History of Present Illness: Marvin Foster is a 82 y.o. male with hx of coronary artery disease, diabetes, hyperlipidemia, and atrial fibrillation. He has been doing very well, no complaints. No shortness of breath, no chest pain, no exertional anginal symptoms. 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 He is currently Coumadin for atrial fibrillation. Metoprolol tartrate to 100 mg BID. He states he is doing well on the therapy without any symptoms of atrial fibrillation, nor med side effects.  His EKG on 08/12/16 demonstrated atrial fibrillation with good rate control once again, previously he was in sinus rhythm. No indications to cardiovert given his asymptomatic nature and probability of returning back to atrial fibrillation.  Crop walk almost made it he states. No anginal symptoms. Felt some fatigue well holding the hand of his daughter.  10/14 - had terrible pain in inner thigh, hard to get up and sit down. Did crop walk. Left upper inner thigh pain going up hill.    Wt Readings from Last 3 Encounters:  08/12/16 239 lb (108.4 kg)  05/20/16 234 lb (106.1 kg)  02/02/16 226 lb 6 oz (102.7 kg)     Past Medical History:  Diagnosis Date  . Arthritis 12/2005   left hand  . Arthritis    Left knee  . Bursitis of knee    right knee  . CAD (coronary artery disease)   . Diabetes mellitus   . Glaucoma    left eye  . Hyperlipidemia   . Hypertension   . Neuropathy ([place])    with pain  . Spinal stenosis 01/20/05    Past Surgical History:  Procedure Laterality Date  . BACK SURGERY    . CATARACT EXTRACTION  08/11/09   right  . corneal endothelial Left 2013  . CORNEAL TRANSPLANT Left    eye  . CORONARY ANGIOPLASTY WITH STENT  PLACEMENT      Current Outpatient Prescriptions  Medication Sig Dispense Refill  . ACCU-CHEK FASTCLIX LANCETS MISC 1 Device by Does not apply route daily. 102 each 11  . atorvastatin (LIPITOR) 40 MG tablet TAKE ONE TABLET BY MOUTH AT BEDTIME 90 tablet 1  . Calcium Carbonate-Vitamin D (CALCIUM 600 + D PO) Take 1 tablet by mouth 2 (two) times daily.     . diclofenac sodium (VOLTAREN) 1 % GEL Apply 4 g topically 4 (four) times daily. 100 g 3  . enalapril (VASOTEC) 5 MG tablet Take 1 tablet (5 mg total) by mouth daily. 90 tablet 3  . fish oil-omega-3 fatty acids 1000 MG capsule Take 4 g by mouth daily.     [name] Kitchen glucose blood (ACCU-CHEK AVIVA PLUS) test strip USE STRIP TO CHECK GLUCOSE TWICE DAILY 100 each 6  . levothyroxine (SYNTHROID, LEVOTHROID) 75 MCG tablet Take 1 tablet (75 mcg total) by mouth daily. 30 tablet 6  . metFORMIN (GLUCOPHAGE) 1000 MG tablet TAKE ONE TABLET BY MOUTH TWICE DAILY 180 tablet 1  . metoprolol (LOPRESSOR) 100 MG tablet TAKE ONE TABLET BY MOUTH TWICE DAILY 180 tablet 1  . mometasone (ELOCON) 0.1 % cream Apply 1 application topically daily. 15 g 0  . Multiple Vitamin (MULTIVITAMIN) capsule Take 1 capsule by mouth daily.      [name] Kitchen  prednisoLONE acetate (PRED FORTE) 1 % ophthalmic suspension Place 1 drop into both eyes daily.     [name] Kitchen warfarin (COUMADIN) 5 MG tablet TAKE AS DIRECTED BY COUMADIN CLINIC 90 tablet 0   No current facility-administered medications for this visit.     Allergies:    Allergies  Allergen Reactions  . Neomycin-Bacitracin Zn-Polymyx Rash  . Penicillins Other (See Comments)    Pt states this was diagnosed years ago, unsure as to reaction. Will not use.     Social History:  The patient  reports that he has never smoked. He has never used smokeless tobacco. He reports that he drinks alcohol. He reports that he does not use drugs.   ROS:  Please see the history of present illness.   Denies any syncope, bleeding, orthopnea, PND    PHYSICAL EXAM: VS:  BP  124/68   Pulse 74   Ht 5\' 10"  (1.778 m)   Wt 239 lb (108.4 kg)   BMI 34.29 kg/m  Well nourished, well developed, in no acute distress  HEENT: normal  Neck: no JVD  Cardiac:  normal S1, S2; irreg irreg normal rate; no murmur  Lungs:  clear to auscultation bilaterally, no wheezing, rhonchi or rales  Abd: soft, nontender, no hepatomegaly  Ext: no edema  Skin: warm and dry  Neuro: no focal abnormalities noted  EKG:   EKG ordered today 08/12/16-atrial fibrillation heart rate 74 with no other changes. Today 08/05/15 - 69, sinus rhythm, no other abnormalities personally viewed-priorSinus rhythm rate 80 with no other changes.     ASSESSMENT AND PLAN:  1. Coronary artery disease-currently stable, no exertional anginal symptoms, doing well. Proximal LAD 03/01/2005 stent. Stress test 2006 no ischemia 2. Atrial fibrillation-seems to have reverted back to atrial fibrillation. Previously had been maintaining sinus rhythm for quite some time. We will continue with anticoagulation. Only on metoprolol currently well controlled.  3. Hyperlipidemia-atorvastatin. Goal LDL less than 70. 09/13/13, LDL 51. 4.  chronic anticoagulation -doing very well on warfarin. 5.  essential hypertension-currently well controlled. Medications reviewed. 6.  Diabetes- medications reviewed. Per Dr. Birdie Riddle 7. Obesity- continue to work on weight loss. 8. Left groin pain-could be hip arthritis/most skeletal. Doubt vascular given the fact that it occurs with no activity just moving his leg. Recommended that he start with Dr. Birdie Riddle or perhaps see his orthopedist.  Signed, Candee Furbish, MD [facility]  08/12/2016 9:58 AM

## 2016-08-13 DIAGNOSIS — M25552 Pain in left hip: Secondary | ICD-10-CM | POA: Diagnosis not present

## 2016-08-13 DIAGNOSIS — S76212A Strain of adductor muscle, fascia and tendon of left thigh, initial encounter: Secondary | ICD-10-CM | POA: Diagnosis not present

## 2016-08-13 DIAGNOSIS — R1032 Left lower quadrant pain: Secondary | ICD-10-CM | POA: Diagnosis not present

## 2016-08-13 DIAGNOSIS — S8012XA Contusion of left lower leg, initial encounter: Secondary | ICD-10-CM | POA: Diagnosis not present

## 2016-08-25 DIAGNOSIS — S76212D Strain of adductor muscle, fascia and tendon of left thigh, subsequent encounter: Secondary | ICD-10-CM | POA: Diagnosis not present

## 2016-09-07 DIAGNOSIS — R69 Illness, unspecified: Secondary | ICD-10-CM | POA: Diagnosis not present

## 2016-09-08 DIAGNOSIS — S76212D Strain of adductor muscle, fascia and tendon of left thigh, subsequent encounter: Secondary | ICD-10-CM | POA: Diagnosis not present

## 2016-09-09 ENCOUNTER — Ambulatory Visit (INDEPENDENT_AMBULATORY_CARE_PROVIDER_SITE_OTHER): Payer: Medicare HMO | Admitting: *Deleted

## 2016-09-09 DIAGNOSIS — I4891 Unspecified atrial fibrillation: Secondary | ICD-10-CM

## 2016-09-09 DIAGNOSIS — Z7901 Long term (current) use of anticoagulants: Secondary | ICD-10-CM | POA: Diagnosis not present

## 2016-09-09 DIAGNOSIS — Z5181 Encounter for therapeutic drug level monitoring: Secondary | ICD-10-CM

## 2016-09-09 LAB — POCT INR: INR: 2.7

## 2016-09-14 ENCOUNTER — Other Ambulatory Visit: Payer: Self-pay | Admitting: Family Medicine

## 2016-09-16 ENCOUNTER — Ambulatory Visit: Payer: Medicare HMO | Admitting: Family Medicine

## 2016-09-16 DIAGNOSIS — Z947 Corneal transplant status: Secondary | ICD-10-CM | POA: Diagnosis not present

## 2016-09-16 DIAGNOSIS — H35371 Puckering of macula, right eye: Secondary | ICD-10-CM | POA: Diagnosis not present

## 2016-09-16 DIAGNOSIS — Z961 Presence of intraocular lens: Secondary | ICD-10-CM | POA: Diagnosis not present

## 2016-09-16 DIAGNOSIS — H401134 Primary open-angle glaucoma, bilateral, indeterminate stage: Secondary | ICD-10-CM | POA: Diagnosis not present

## 2016-09-23 ENCOUNTER — Ambulatory Visit (INDEPENDENT_AMBULATORY_CARE_PROVIDER_SITE_OTHER): Payer: Medicare HMO | Admitting: Family Medicine

## 2016-09-23 ENCOUNTER — Encounter: Payer: Self-pay | Admitting: Family Medicine

## 2016-09-23 VITALS — BP 123/72 | HR 85 | Temp 98.1°F | Resp 16 | Ht 70.0 in | Wt 236.5 lb

## 2016-09-23 DIAGNOSIS — E1139 Type 2 diabetes mellitus with other diabetic ophthalmic complication: Secondary | ICD-10-CM | POA: Diagnosis not present

## 2016-09-23 LAB — BASIC METABOLIC PANEL
BUN: 16 mg/dL (ref 6–23)
CO2: 30 mEq/L (ref 19–32)
Calcium: 9.3 mg/dL (ref 8.4–10.5)
Chloride: 104 mEq/L (ref 96–112)
Creatinine, Ser: 0.96 mg/dL (ref 0.40–1.50)
GFR: 79.94 mL/min (ref >60.00)
Glucose, Bld: 152 mg/dL — ABNORMAL HIGH (ref 70–99)
Potassium: 4.9 mEq/L (ref 3.5–5.1)
Sodium: 140 mEq/L (ref 135–145)

## 2016-09-23 LAB — HEMOGLOBIN A1C: Hgb A1c MFr Bld: 6.6 % — ABNORMAL HIGH (ref 4.6–6.5)

## 2016-09-23 NOTE — Assessment & Plan Note (Signed)
Chronic problem.  Hx of good control.  UTD on foot exam, eye exam.  On ACE for renal protection.  Check labs.  Adjust meds prn  

## 2016-09-23 NOTE — Progress Notes (Signed)
Pre visit review using our clinic review tool, if applicable. No additional management support is needed unless otherwise documented below in the visit note. 

## 2016-09-23 NOTE — Progress Notes (Signed)
   Subjective:    Patient ID: Marvin Foster, male    DOB: [birth year], 82 y.o.   MRN: [id number]  HPI DM- chronic problem, on Metformin twice daily.  On ACE for renal protection.  UTD on eye exam, foot exam.  Pt has lost 3 lbs since last visit.  Completed the Crop Walk in October.  No CP, SOB, HAs, visual changes, edema, abd pain, N/V, no numbness/tingling above baseline.  Denies symptomatic lows.   Review of Systems For ROS see HPI     Objective:   Physical Exam  Constitutional: He is oriented to person, place, and time. He appears well-developed and well-nourished. No distress.  HENT:  Head: Normocephalic and atraumatic.  Eyes: Conjunctivae and EOM are normal. Pupils are equal, round, and reactive to light.  Neck: Normal range of motion. Neck supple. No thyromegaly present.  Cardiovascular: Normal rate, regular rhythm, normal heart sounds and intact distal pulses.   No murmur heard. Pulmonary/Chest: Effort normal and breath sounds normal. No respiratory distress.  Abdominal: Soft. Bowel sounds are normal. He exhibits no distension.  Musculoskeletal: He exhibits no edema.  Lymphadenopathy:    He has no cervical adenopathy.  Neurological: He is alert and oriented to person, place, and time. No cranial nerve deficit.  Skin: Skin is warm and dry.  Psychiatric: He has a normal mood and affect. His behavior is normal.  Vitals reviewed.         Assessment & Plan:

## 2016-09-23 NOTE — Patient Instructions (Signed)
Schedule your complete physical in 3-4 months We'll notify you of your lab results and make any changes if needed Continue to work on healthy diet and regular exercise- you look great!! Call with any questions or concerns Happy Holidays!!!

## 2016-10-07 ENCOUNTER — Ambulatory Visit (INDEPENDENT_AMBULATORY_CARE_PROVIDER_SITE_OTHER): Payer: Medicare HMO | Admitting: *Deleted

## 2016-10-07 DIAGNOSIS — Z5181 Encounter for therapeutic drug level monitoring: Secondary | ICD-10-CM

## 2016-10-07 DIAGNOSIS — I4891 Unspecified atrial fibrillation: Secondary | ICD-10-CM | POA: Diagnosis not present

## 2016-10-07 DIAGNOSIS — Z7901 Long term (current) use of anticoagulants: Secondary | ICD-10-CM | POA: Diagnosis not present

## 2016-10-07 LAB — POCT INR: INR: 2

## 2016-10-18 ENCOUNTER — Ambulatory Visit (INDEPENDENT_AMBULATORY_CARE_PROVIDER_SITE_OTHER): Payer: Medicare HMO | Admitting: Urgent Care

## 2016-10-18 VITALS — BP 126/80 | HR 84 | Temp 97.9°F | Resp 18 | Ht 70.0 in | Wt 238.0 lb

## 2016-10-18 DIAGNOSIS — S80211A Abrasion, right knee, initial encounter: Secondary | ICD-10-CM | POA: Diagnosis not present

## 2016-10-18 DIAGNOSIS — S0001XA Abrasion of scalp, initial encounter: Secondary | ICD-10-CM

## 2016-10-18 DIAGNOSIS — W19XXXA Unspecified fall, initial encounter: Secondary | ICD-10-CM

## 2016-10-18 DIAGNOSIS — L03011 Cellulitis of right finger: Secondary | ICD-10-CM | POA: Diagnosis not present

## 2016-10-18 MED ORDER — CEPHALEXIN 500 MG PO CAPS: 500.0000 mg | ORAL_CAPSULE | Freq: Three times a day (TID) | ORAL | 0 refills | Status: DC

## 2016-10-18 MED ORDER — MUPIROCIN 2 % EX OINT: 1.0000 "application " | TOPICAL_OINTMENT | Freq: Three times a day (TID) | CUTANEOUS | 1 refills | Status: DC

## 2016-10-18 NOTE — Patient Instructions (Addendum)
If you tolerate Keflex without any side effects such as a rash, swelling of lips or tongue then it is safe to say that you are able to take a class of antibiotics known as cephalosporins. If you experience these symptoms, then stop taking Keflex, start benadryl and let me know. We will switch out your antibiotic and consider using stronger medications for an allergic reaction.   Paronychia Introduction Paronychia is an infection of the skin. It happens near a fingernail or toenail. It may cause pain and swelling around the nail. Usually, it is not serious and it clears up with treatment. Follow these instructions at home:  Soak the fingers or toes in warm water as told by your doctor. You may be told to do this for 20 minutes, 2-3 times a day.  Keep the area dry when you are not soaking it.  Take medicines only as told by your doctor.  If you were given an antibiotic medicine, finish all of it even if you start to feel better.  Keep the affected area clean.  Do not try to drain a fluid-filled bump yourself.  Wear rubber gloves when putting your hands in water.  Wear gloves if your hands might touch cleaners or chemicals.  Follow your doctor's instructions about:  Wound care.  Bandage (dressing) changes and removal. Contact a doctor if:  Your symptoms get worse or do not improve.  You have a fever or chills.  You have redness spreading from the affected area.  You have more fluid, blood, or pus coming from the affected area.  Your finger or knuckle is swollen or is hard to move. This information is not intended to replace advice given to you by your health care provider. Make sure you discuss any questions you have with your health care provider. Document Released: 09/15/2009 Document Revised: 03/04/2016 Document Reviewed: 09/04/2014  2017 Elsevier    Abrasion An abrasion is a cut or scrape on the outer surface of your skin. An abrasion does not extend through all of  the layers of your skin. It is important to care for your abrasion properly to prevent infection. What are the causes? Most abrasions are caused by falling on or gliding across the ground or another surface. When your skin rubs on something, the outer and inner layer of skin rubs off. What are the signs or symptoms? A cut or scrape is the main symptom of this condition. The scrape may be bleeding, or it may appear red or pink. If there was an associated fall, there may be an underlying bruise. How is this diagnosed? An abrasion is diagnosed with a physical exam. How is this treated? Treatment for this condition depends on how large and deep the abrasion is. Usually, your abrasion will be cleaned with water and mild soap. This removes any dirt or debris that may be stuck. An antibiotic ointment may be applied to the abrasion to help prevent infection. A bandage (dressing) may be placed on the abrasion to keep it clean. You may also need a tetanus shot. Follow these instructions at home: Medicines  Take or apply medicines only as directed by your health care provider.  If you were prescribed an antibiotic ointment, finish all of it even if you start to feel better. Wound care  Clean the wound with mild soap and water 2-3 times per day or as directed by your health care provider. Pat your wound dry with a clean towel. Do not rub it.  There  are many different ways to close and cover a wound. Follow instructions from your health care provider about:  Wound care.  Dressing changes and removal.  Check your wound every day for signs of infection. Watch for:  Redness, swelling, or pain.  Fluid, blood, or pus. General instructions  Keep the dressing dry as directed by your health care provider. Do not take baths, swim, use a hot tub, or do anything that would put your wound underwater until your health care provider approves.  If there is swelling, raise (elevate) the injured area above the  level of your heart while you are sitting or lying down.  Keep all follow-up visits as directed by your health care provider. This is important. Contact a health care provider if:  You received a tetanus shot and you have swelling, severe pain, redness, or bleeding at the injection site.  Your pain is not controlled with medicine.  You have increased redness, swelling, or pain at the site of your wound. Get help right away if:  You have a red streak going away from your wound.  You have a fever.  You have fluid, blood, or pus coming from your wound.  You notice a bad smell coming from your wound or your dressing. This information is not intended to replace advice given to you by your health care provider. Make sure you discuss any questions you have with your health care provider. Document Released: 07/07/2005 Document Revised: 05/28/2016 Document Reviewed: 09/25/2014 Elsevier Interactive Patient Education  2017 [employer].     IF you received an x-ray today, you will receive an invoice from [facility]. Please contact [facility] at [phone] with questions or concerns regarding your invoice.   IF you received labwork today, you will receive an invoice from [facility]. Please contact [facility] at [phone] with questions or concerns regarding your invoice.   Our billing staff will not be able to assist you with questions regarding bills from these companies.  You will be contacted with the lab results as soon as they are available. The fastest way to get your results is to activate your My Chart account. Instructions are located on the last page of this paperwork. If you have not heard from [place] regarding the results in 2 weeks, please contact this office.

## 2016-10-18 NOTE — Progress Notes (Signed)
MRN: [id number] DOB: [birth year]  Subjective:   Marvin Foster is a 83 y.o. male presenting for chief complaint of Fall (BRUISE ON HEAD, KNEE SWELLING) and Hand Pain (RIGHT MIDDLE FINGER HURTS)  Finger - Reports 1 week history of worsening right middle finger pain. Has redness, swelling. Pain is like a throbbing sensation and is constant. He denies trauma, picking his nail. Of note, patient has history of allergy to penicillin. States that he cannot remember what the reaction was or how the allergy came up. He knows for sure he is allergic to Neosporin.  Fall - Reports suffering a fall yesterday, missed the last step in his daughter's garage. He broke his fall by falling on his right side, made contact with his right head and knee. Has some very mild pain over his right knee where he suffered a scrape. Has kept it covered with a bandage. Has tried a couple of ibuprofen with some improvement. Denies loss of consciousness, confusion, numbness or tingling, headache, n/v, back pain, hip pain.  Marvin Foster has a current medication list which includes the following prescription(s): accu-chek fastclix lancets, atorvastatin, calcium carb-cholecalciferol, diclofenac sodium, enalapril, fish oil-omega-3 fatty acids, glucose blood, levothyroxine, metformin, metoprolol, mometasone, multivitamin, prednisolone acetate, and warfarin. Also is allergic to neomycin-bacitracin zn-polymyx and penicillins.  Marvin Foster  has a past medical history of Arthritis (12/2005); Arthritis; Blood transfusion without reported diagnosis; Bursitis of knee; CAD (coronary artery disease); Diabetes mellitus; Glaucoma; Hyperlipidemia; Hypertension; Neuropathy ([place]); and Spinal stenosis (01/20/05). Also  has a past surgical history that includes Back surgery; Coronary angioplasty with stent; Cataract extraction (08/11/09); corneal endothelial (Left, 2013); Corneal transplant (Left); Eye surgery; Vasectomy; Spine surgery; and Small intestine  surgery.  Objective:   Vitals: BP 126/80 (BP Location: Right Arm, Patient Position: Sitting, Cuff Size: Large)   Pulse 84   Temp 97.9 F (36.6 C) (Oral)   Resp 18   Ht 5\' 10"  (1.778 m)   Wt 238 lb (108 kg)   SpO2 97%   BMI 34.15 kg/m   Physical Exam  Constitutional: He is oriented to person, place, and time. He appears well-developed and well-nourished.  HENT:  TM's intact bilaterally, no effusions or erythema. Nasal turbinates pink and moist, nasal passages patent. No sinus tenderness. Oropharynx clear, mucous membranes moist, dentition in good repair.  Eyes: EOM are normal. Pupils are equal, round, and reactive to light.  Neck: Normal range of motion. Neck supple.  Cardiovascular: Normal rate, regular rhythm and intact distal pulses.  Exam reveals no gallop and no friction rub.   No murmur heard. Pulmonary/Chest: No respiratory distress. He has no wheezes. He has no rales.  Musculoskeletal: Normal range of motion. He exhibits no edema or deformity.       Right hand: He exhibits tenderness and swelling. He exhibits normal range of motion, no bony tenderness, normal capillary refill, no deformity and no laceration. Normal sensation noted. Normal strength noted.       Hands: Neurological: He is alert and oriented to person, place, and time. No cranial nerve deficit. Coordination normal.  Skin: Skin is warm and dry.  ~1cm abrasion over anterior right knee. ~2cm abrasion over right upper temporal area.   Assessment and Plan :   This case was precepted with Dr. Mitchel Honour.   1. Paronychia of finger of right hand - Start Keflex to address paronychia. I&D was not appropriate given lack of fluctuance. Patient was advised to stop Keflex if a rash or other allergic reaction develops. Start  benadryl if this is the case, consider switching to doxycycline.   2. Fall, initial encounter 3. Abrasion, scalp w/o infection 4. Abrasion, knee, right, initial encounter - Physical exam findings  are very reassuring. Use mupirocin over right knee abrasion. Use APAP as needed for aching.  Marvin Eagles, PA-C [facility] [phone] 10/18/2016 4:36 PM

## 2016-10-19 ENCOUNTER — Telehealth: Payer: Self-pay

## 2016-10-19 DIAGNOSIS — H35371 Puckering of macula, right eye: Secondary | ICD-10-CM | POA: Diagnosis not present

## 2016-10-19 DIAGNOSIS — H1851 Endothelial corneal dystrophy: Secondary | ICD-10-CM | POA: Diagnosis not present

## 2016-10-19 DIAGNOSIS — Z961 Presence of intraocular lens: Secondary | ICD-10-CM | POA: Diagnosis not present

## 2016-10-19 DIAGNOSIS — H401112 Primary open-angle glaucoma, right eye, moderate stage: Secondary | ICD-10-CM | POA: Diagnosis not present

## 2016-10-19 DIAGNOSIS — Z947 Corneal transplant status: Secondary | ICD-10-CM | POA: Diagnosis not present

## 2016-10-19 DIAGNOSIS — H401123 Primary open-angle glaucoma, left eye, severe stage: Secondary | ICD-10-CM | POA: Diagnosis not present

## 2016-10-19 NOTE — Telephone Encounter (Signed)
PATIENT STATES HE SAW Marvin Foster MANI ON Monday FOR HIS KNEE AND FINGER. Marvin Foster GAVE HIM 2 ANTIBOTICIS. HE FORGOT TO TELL HIM THAT HE HAS A COLD WITH COUGH AND RUNNY NOSE. HE WOULD LIKE TO KNOW IF HE CAN TAKE MUCINEX WHICH HE ALREADY HAS AT HOME WITH THIS NEW MEDICINE? BEST PHONE [phone] (CELL)  PHARMACY CHOICE IS [facility] ON [address].  [place]

## 2016-10-23 NOTE — Telephone Encounter (Signed)
Left message may take the Mucinex as needed for cough, congestion Advised to return call if needed

## 2016-10-28 ENCOUNTER — Other Ambulatory Visit: Payer: Self-pay | Admitting: Cardiology

## 2016-11-02 DIAGNOSIS — Z6834 Body mass index (BMI) 34.0-34.9, adult: Secondary | ICD-10-CM | POA: Diagnosis not present

## 2016-11-02 DIAGNOSIS — Z9181 History of falling: Secondary | ICD-10-CM | POA: Diagnosis not present

## 2016-11-02 DIAGNOSIS — H02403 Unspecified ptosis of bilateral eyelids: Secondary | ICD-10-CM | POA: Diagnosis not present

## 2016-11-02 DIAGNOSIS — H4010X0 Unspecified open-angle glaucoma, stage unspecified: Secondary | ICD-10-CM | POA: Diagnosis not present

## 2016-11-02 DIAGNOSIS — E785 Hyperlipidemia, unspecified: Secondary | ICD-10-CM | POA: Diagnosis not present

## 2016-11-02 DIAGNOSIS — Z961 Presence of intraocular lens: Secondary | ICD-10-CM | POA: Diagnosis not present

## 2016-11-02 DIAGNOSIS — I482 Chronic atrial fibrillation: Secondary | ICD-10-CM | POA: Diagnosis not present

## 2016-11-02 DIAGNOSIS — Z974 Presence of external hearing-aid: Secondary | ICD-10-CM | POA: Diagnosis not present

## 2016-11-02 DIAGNOSIS — H9113 Presbycusis, bilateral: Secondary | ICD-10-CM | POA: Diagnosis not present

## 2016-11-02 DIAGNOSIS — I2510 Atherosclerotic heart disease of native coronary artery without angina pectoris: Secondary | ICD-10-CM | POA: Diagnosis not present

## 2016-11-02 DIAGNOSIS — Z0000 Encounter for general adult medical examination without abnormal findings: Secondary | ICD-10-CM | POA: Diagnosis not present

## 2016-11-02 DIAGNOSIS — I10 Essential (primary) hypertension: Secondary | ICD-10-CM | POA: Diagnosis not present

## 2016-11-02 DIAGNOSIS — Z7984 Long term (current) use of oral hypoglycemic drugs: Secondary | ICD-10-CM | POA: Diagnosis not present

## 2016-11-02 DIAGNOSIS — Z955 Presence of coronary angioplasty implant and graft: Secondary | ICD-10-CM | POA: Diagnosis not present

## 2016-11-02 DIAGNOSIS — Z7901 Long term (current) use of anticoagulants: Secondary | ICD-10-CM | POA: Diagnosis not present

## 2016-11-02 DIAGNOSIS — E669 Obesity, unspecified: Secondary | ICD-10-CM | POA: Diagnosis not present

## 2016-11-02 DIAGNOSIS — E1142 Type 2 diabetes mellitus with diabetic polyneuropathy: Secondary | ICD-10-CM | POA: Diagnosis not present

## 2016-11-02 DIAGNOSIS — Z9849 Cataract extraction status, unspecified eye: Secondary | ICD-10-CM | POA: Diagnosis not present

## 2016-11-02 DIAGNOSIS — Z7982 Long term (current) use of aspirin: Secondary | ICD-10-CM | POA: Diagnosis not present

## 2016-11-09 DIAGNOSIS — H21531 Iridodialysis, right eye: Secondary | ICD-10-CM | POA: Diagnosis not present

## 2016-11-11 ENCOUNTER — Ambulatory Visit (INDEPENDENT_AMBULATORY_CARE_PROVIDER_SITE_OTHER): Payer: Medicare HMO | Admitting: *Deleted

## 2016-11-11 ENCOUNTER — Telehealth: Payer: Self-pay | Admitting: Family Medicine

## 2016-11-11 DIAGNOSIS — Z7901 Long term (current) use of anticoagulants: Secondary | ICD-10-CM | POA: Diagnosis not present

## 2016-11-11 DIAGNOSIS — R69 Illness, unspecified: Secondary | ICD-10-CM | POA: Diagnosis not present

## 2016-11-11 DIAGNOSIS — Z5181 Encounter for therapeutic drug level monitoring: Secondary | ICD-10-CM

## 2016-11-11 DIAGNOSIS — I4891 Unspecified atrial fibrillation: Secondary | ICD-10-CM | POA: Diagnosis not present

## 2016-11-11 LAB — POCT INR: INR: 2.5

## 2016-11-11 MED ORDER — GLUCOSE BLOOD VI STRP: ORAL_STRIP | 12 refills | Status: DC

## 2016-11-11 MED ORDER — ONETOUCH ULTRASOFT LANCETS MISC: 12 refills | Status: DC

## 2016-11-11 NOTE — Telephone Encounter (Signed)
Medication filled to pharmacy as requested.   

## 2016-11-11 NOTE — Telephone Encounter (Signed)
Patients states he has a new glucometer(Onetouch Verio) and need rx for test strips sent to pharmacy.  Pharmacy: [place], [place]. [phone] (Phone) [phone] (Fax)

## 2016-11-24 ENCOUNTER — Ambulatory Visit (INDEPENDENT_AMBULATORY_CARE_PROVIDER_SITE_OTHER): Payer: Medicare HMO | Admitting: Ophthalmology

## 2016-11-24 DIAGNOSIS — H353132 Nonexudative age-related macular degeneration, bilateral, intermediate dry stage: Secondary | ICD-10-CM

## 2016-11-24 DIAGNOSIS — H35033 Hypertensive retinopathy, bilateral: Secondary | ICD-10-CM | POA: Diagnosis not present

## 2016-11-24 DIAGNOSIS — E113293 Type 2 diabetes mellitus with mild nonproliferative diabetic retinopathy without macular edema, bilateral: Secondary | ICD-10-CM | POA: Diagnosis not present

## 2016-11-24 DIAGNOSIS — E11319 Type 2 diabetes mellitus with unspecified diabetic retinopathy without macular edema: Secondary | ICD-10-CM

## 2016-11-24 DIAGNOSIS — H1851 Endothelial corneal dystrophy: Secondary | ICD-10-CM

## 2016-11-24 DIAGNOSIS — H43813 Vitreous degeneration, bilateral: Secondary | ICD-10-CM | POA: Diagnosis not present

## 2016-11-24 DIAGNOSIS — I10 Essential (primary) hypertension: Secondary | ICD-10-CM | POA: Diagnosis not present

## 2016-11-24 LAB — HM DIABETES EYE EXAM

## 2016-11-25 DIAGNOSIS — H401112 Primary open-angle glaucoma, right eye, moderate stage: Secondary | ICD-10-CM | POA: Diagnosis not present

## 2016-11-25 DIAGNOSIS — Z947 Corneal transplant status: Secondary | ICD-10-CM | POA: Diagnosis not present

## 2016-11-25 DIAGNOSIS — Z961 Presence of intraocular lens: Secondary | ICD-10-CM | POA: Diagnosis not present

## 2016-11-25 DIAGNOSIS — H35371 Puckering of macula, right eye: Secondary | ICD-10-CM | POA: Diagnosis not present

## 2016-11-25 DIAGNOSIS — H1851 Endothelial corneal dystrophy: Secondary | ICD-10-CM | POA: Diagnosis not present

## 2016-11-25 DIAGNOSIS — H401123 Primary open-angle glaucoma, left eye, severe stage: Secondary | ICD-10-CM | POA: Diagnosis not present

## 2016-12-02 ENCOUNTER — Encounter: Payer: Self-pay | Admitting: General Practice

## 2016-12-23 ENCOUNTER — Ambulatory Visit (INDEPENDENT_AMBULATORY_CARE_PROVIDER_SITE_OTHER): Payer: Medicare HMO | Admitting: Pharmacist

## 2016-12-23 DIAGNOSIS — I4891 Unspecified atrial fibrillation: Secondary | ICD-10-CM | POA: Diagnosis not present

## 2016-12-23 DIAGNOSIS — Z7901 Long term (current) use of anticoagulants: Secondary | ICD-10-CM

## 2016-12-23 DIAGNOSIS — Z5181 Encounter for therapeutic drug level monitoring: Secondary | ICD-10-CM | POA: Diagnosis not present

## 2016-12-23 LAB — POCT INR: INR: 2.1

## 2017-01-25 NOTE — Progress Notes (Signed)
Subjective:   Marvin Foster is a 83 y.o. male who presents for Medicare Annual/Subsequent preventive examination.  Review of Systems:  No ROS.  Medicare Wellness Visit.  Cardiac Risk Factors include: advanced age (>43men, >87 women);diabetes mellitus;dyslipidemia;hypertension;male gender;obesity (BMI >30kg/m2)   Sleep patterns: Sleeps about 5 hours. Up to void x 1. Leg pain after 5 hours of sleep. Feels rested.  Home Safety/Smoke Alarms:  Smoke detectors in place.  Living environment; residence and Firearm Safety: Lives alone in 1 story home. Feels safe in home. Family lives close, checks on patient daily.  Seat Belt Safety/Bike Helmet: Wears seat belt.   Counseling:   Eye Exam-Last exam 11/24/2016, +Diabetic retinopathy. Frequently by Dr. Danny Lawless exam 08/2016, every 6 months [employer]. Dentures.  Male:   CCS-Colonoscopy 06/26/2015, polyps.  PSA-10/09/2015, 2.030.      Objective:    Vitals: BP 128/70 (BP Location: Right Arm, Patient Position: Sitting, Cuff Size: Normal)   Pulse 79   Temp 98.5 F (36.9 C) (Oral)   Resp 18   Ht 5\' 10"  (1.778 m)   Wt 237 lb (107.5 kg)   SpO2 97%   BMI 34.01 kg/m   Body mass index is 34.01 kg/m.  Tobacco History  Smoking Status  . Never Smoker  Smokeless Tobacco  . Never Used     Counseling given: No   Past Medical History:  Diagnosis Date  . Arthritis 12/2005   left hand  . Arthritis    Left knee  . Blood transfusion without reported diagnosis   . Bursitis of knee    right knee  . CAD (coronary artery disease)   . Diabetes mellitus   . Glaucoma    left eye  . Hyperlipidemia   . Hypertension   . Neuropathy    with pain  . Spinal stenosis 01/20/05   Past Surgical History:  Procedure Laterality Date  . BACK SURGERY    . CATARACT EXTRACTION  08/11/09   right  . corneal endothelial Left 2013  . CORNEAL TRANSPLANT Left    eye  . CORONARY ANGIOPLASTY WITH STENT PLACEMENT    . EYE SURGERY    . SMALL  INTESTINE SURGERY    . SPINE SURGERY    . VASECTOMY     Family History  Problem Relation Age of Onset  . Heart failure Mother   . Heart failure Father    History  Sexual Activity  . Sexual activity: Not on file    Outpatient Encounter Prescriptions as of 01/26/2017  Medication Sig  . aspirin EC 81 MG tablet Take by mouth.  [name] Kitchen atorvastatin (LIPITOR) 40 MG tablet TAKE ONE TABLET BY MOUTH AT BEDTIME  . brimonidine (ALPHAGAN) 0.2 % ophthalmic solution Apply to eye.  . Calcium Carbonate-Vitamin D (CALCIUM 600 + D PO) Take 1 tablet by mouth 2 (two) times daily.   . dorzolamide (TRUSOPT) 2 % ophthalmic solution Apply to eye.  . enalapril (VASOTEC) 5 MG tablet Take 1 tablet (5 mg total) by mouth daily.  . fluorometholone (FML) 0.1 % ophthalmic suspension   . glucose blood (ONETOUCH VERIO) test strip Use as instructed  . Lancets (ONETOUCH ULTRASOFT) lancets Use one lancet each time sugars are tested, pt tests sugars twice daily.  [name] Kitchen latanoprost (XALATAN) 0.005 % ophthalmic solution   . metFORMIN (GLUCOPHAGE) 1000 MG tablet TAKE ONE TABLET BY MOUTH TWICE DAILY  . metoprolol (LOPRESSOR) 100 MG tablet TAKE ONE TABLET BY MOUTH TWICE DAILY  . Multiple Vitamin (  MULTIVITAMIN) capsule Take 1 capsule by mouth daily.    . Omega-3 Fatty Acids (FISH OIL) 1200 MG CAPS Take 3 capsules by mouth daily.  . timolol (TIMOPTIC) 0.5 % ophthalmic solution Apply to eye.  . warfarin (COUMADIN) 5 MG tablet TAKE AS DIRECTED BY  THE  COUMADIN  CLINIC  . [DISCONTINUED] fish oil-omega-3 fatty acids 1000 MG capsule Take 4 g by mouth daily.   . cephALEXin (KEFLEX) 500 MG capsule Take 1 capsule (500 mg total) by mouth 3 (three) times daily. (Patient not taking: Reported on 01/26/2017)  . diclofenac sodium (VOLTAREN) 1 % GEL Apply 4 g topically 4 (four) times daily. (Patient not taking: Reported on 01/26/2017)  . levothyroxine (SYNTHROID, LEVOTHROID) 75 MCG tablet Take 1 tablet (75 mcg total) by mouth daily. (Patient not  taking: Reported on 01/26/2017)  . mometasone (ELOCON) 0.1 % cream Apply 1 application topically daily. (Patient not taking: Reported on 01/26/2017)  . mupirocin ointment (BACTROBAN) 2 % Apply 1 application topically 3 (three) times daily. (Patient not taking: Reported on 01/26/2017)  . prednisoLONE acetate (PRED FORTE) 1 % ophthalmic suspension Place 1 drop into both eyes daily.    No facility-administered encounter medications on file as of 01/26/2017.     Activities of Daily Living In your present state of health, do you have any difficulty performing the following activities: 01/26/2017  Hearing? N  Vision? N  Difficulty concentrating or making decisions? N  Walking or climbing stairs? N  Dressing or bathing? N  Doing errands, shopping? N  Preparing Food and eating ? N  Using the Toilet? N  In the past six months, have you accidently leaked urine? N  Do you have problems with loss of bowel control? N  Managing your Medications? N  Managing your Finances? N  Housekeeping or managing your Housekeeping? N  Some recent data might be hidden    Patient Care Team: [name], MD as PCP - General Jerline Pain, MD as Consulting Physician (Cardiology) [place] (Specialist)   Assessment:    Physical assessment deferred to PCP.  Exercise Activities and Dietary recommendations Exercise limited by: None identified   Volunteers at [employer] daily.   Diet (meal preparation, eat out, water intake, caffeinated beverages, dairy products, fruits and vegetables): Drinks water and coffee.   Breakfast: cereal, fruit, egg sandwich, oatmeal, sausage gravy/bread Lunch: sandwich, soup, salad Dinner: lean meat, vegetables  Discussed heart healthy diet and remaining as active as possible.   Goals      Patient Stated   . <enter goal here> (pt-stated)          Maintain current health by staying active.       Fall Risk Fall Risk   01/26/2017 10/18/2016 10/09/2015 09/25/2015 05/09/2014  Falls in the past year? Yes Yes Yes Yes No  Number falls in past yr: 1 1 2  or more 2 or more -  Injury with Fall? No Yes No No -  Risk Factor Category  - - High Fall Risk High Fall Risk -  Risk for fall due to : - - Impaired mobility;Impaired balance/gait Impaired mobility -  Risk for fall due to (comments): - - - Decreased strenght in legs, bilateral knee pain -  Follow up - - Education provided;Falls prevention discussed Education provided;Falls prevention discussed -   Depression Screen PHQ 2/9 Scores 01/26/2017 10/18/2016 10/09/2015 09/25/2015  PHQ - 2 Score 0 0 0 0    Cognitive  Function MMSE - Mini Mental State Exam 09/25/2015  Orientation to time 5  Orientation to Place 5  Registration 3  Attention/ Calculation 5  Recall 2  Language- name 2 objects 2  Language- repeat 1  Language- follow 3 step command 3  Language- read & follow direction 1  Write a sentence 1  Copy design 1  Total score 29       Ad8 score reviewed for issues:  Issues making decisions: no  Less interest in hobbies / activities: no  Repeats questions, stories (family complaining): no  Trouble using ordinary gadgets (microwave, computer, phone): no  Forgets the month or year: no  Mismanaging finances: no  Remembering appts: no  Daily problems with thinking and/or memory: no Ad8 score is=0     Immunization History  Administered Date(s) Administered  . Influenza Split 08/06/2015  . Influenza Whole 08/14/2007, 07/23/2009, 07/10/2010  . Influenza, High Dose Seasonal PF 07/30/2016  . Influenza,inj,Quad PF,36+ Mos 08/14/2013, 08/13/2014  . Influenza-Unspecified 08/06/2015  . Pneumococcal Conjugate-13 01/16/2015  . Pneumococcal Polysaccharide-23 12/22/2009  . Tdap 08/28/2014  . Zoster 08/20/2010   Screening Tests Health Maintenance  Topic Date Due  . FOOT EXAM  02/11/2017  . HEMOGLOBIN A1C  03/24/2017  . INFLUENZA VACCINE  05/11/2017    . OPHTHALMOLOGY EXAM  11/24/2017  . COLONOSCOPY  06/25/2018  . TETANUS/TDAP  08/28/2024  . PNA vac Low Risk Adult  Completed      Plan:      Bring a copy of your advance directives to your next office visit.  Continue doing brain stimulating activities (puzzles, reading, adult coloring books, staying active) to keep memory sharp.    During the course of the visit the patient was educated and counseled about the following appropriate screening and preventive services:   Vaccines to include Pneumoccal, Influenza, Hepatitis B, Td, Zostavax, HCV  Electrocardiogram  Cardiovascular Disease  Colorectal cancer screening  Diabetes screening  Prostate Cancer Screening  Glaucoma screening  Nutrition counseling   Patient Instructions (the written plan) was given to the patient.    Gerilyn Nestle, RN  01/26/2017  Agree w/ documentation provided above.  Annye Asa, MD

## 2017-01-25 NOTE — Progress Notes (Signed)
Pre visit review using our clinic review tool, if applicable. No additional management support is needed unless otherwise documented below in the visit note. 

## 2017-01-26 ENCOUNTER — Ambulatory Visit (INDEPENDENT_AMBULATORY_CARE_PROVIDER_SITE_OTHER): Payer: Medicare HMO | Admitting: Family Medicine

## 2017-01-26 ENCOUNTER — Encounter: Payer: Self-pay | Admitting: Family Medicine

## 2017-01-26 VITALS — BP 128/70 | HR 79 | Temp 98.5°F | Resp 18 | Ht 70.0 in | Wt 237.0 lb

## 2017-01-26 DIAGNOSIS — Z0000 Encounter for general adult medical examination without abnormal findings: Secondary | ICD-10-CM | POA: Diagnosis not present

## 2017-01-26 DIAGNOSIS — Z125 Encounter for screening for malignant neoplasm of prostate: Secondary | ICD-10-CM

## 2017-01-26 DIAGNOSIS — Z23 Encounter for immunization: Secondary | ICD-10-CM | POA: Diagnosis not present

## 2017-01-26 DIAGNOSIS — E1139 Type 2 diabetes mellitus with other diabetic ophthalmic complication: Secondary | ICD-10-CM | POA: Diagnosis not present

## 2017-01-26 LAB — CBC WITH DIFFERENTIAL/PLATELET
Basophils Absolute: 0.1 10*3/uL (ref 0.0–0.1)
Basophils Relative: 0.9 % (ref 0.0–3.0)
Eosinophils Absolute: 0.4 10*3/uL (ref 0.0–0.7)
Eosinophils Relative: 5 % (ref 0.0–5.0)
HCT: 40.2 % (ref 39.0–52.0)
Hemoglobin: 13.5 g/dL (ref 13.0–17.0)
Lymphocytes Relative: 35 % (ref 12.0–46.0)
Lymphs Abs: 2.5 10*3/uL (ref 0.7–4.0)
MCHC: 33.7 g/dL (ref 30.0–36.0)
MCV: 93.3 fl (ref 78.0–100.0)
Monocytes Absolute: 0.6 10*3/uL (ref 0.1–1.0)
Monocytes Relative: 8.9 % (ref 3.0–12.0)
Neutro Abs: 3.6 10*3/uL (ref 1.4–7.7)
Neutrophils Relative %: 50.2 % (ref 43.0–77.0)
Platelets: 141 10*3/uL — ABNORMAL LOW (ref 150.0–400.0)
RBC: 4.3 Mil/uL (ref 4.22–5.81)
RDW: 14.2 % (ref 11.5–15.5)
WBC: 7.2 10*3/uL (ref 4.0–10.5)

## 2017-01-26 LAB — BASIC METABOLIC PANEL
BUN: 18 mg/dL (ref 6–23)
CO2: 27 mEq/L (ref 19–32)
Calcium: 9.1 mg/dL (ref 8.4–10.5)
Chloride: 106 mEq/L (ref 96–112)
Creatinine, Ser: 0.99 mg/dL (ref 0.40–1.50)
GFR: 77.09 mL/min (ref >60.00)
Glucose, Bld: 108 mg/dL — ABNORMAL HIGH (ref 70–99)
Potassium: 5.1 mEq/L (ref 3.5–5.1)
Sodium: 141 mEq/L (ref 135–145)

## 2017-01-26 LAB — HEPATIC FUNCTION PANEL
ALT: 21 U/L (ref 0–53)
AST: 24 U/L (ref 0–37)
Albumin: 4.2 g/dL (ref 3.5–5.2)
Alkaline Phosphatase: 45 U/L (ref 39–117)
Bilirubin, Direct: 0.3 mg/dL (ref 0.0–0.3)
Total Bilirubin: 0.9 mg/dL (ref 0.2–1.2)
Total Protein: 6.8 g/dL (ref 6.0–8.3)

## 2017-01-26 LAB — LIPID PANEL
Cholesterol: 104 mg/dL (ref 0–200)
HDL: 30.8 mg/dL — ABNORMAL LOW (ref >39.00)
LDL Cholesterol: 49 mg/dL (ref 0–99)
NonHDL: 73.14
Total CHOL/HDL Ratio: 3
Triglycerides: 121 mg/dL (ref 0.0–149.0)
VLDL: 24.2 mg/dL (ref 0.0–40.0)

## 2017-01-26 LAB — HEMOGLOBIN A1C: Hgb A1c MFr Bld: 6.8 % — ABNORMAL HIGH (ref 4.6–6.5)

## 2017-01-26 LAB — TSH: TSH: 4.72 u[IU]/mL — ABNORMAL HIGH (ref 0.35–4.50)

## 2017-01-26 LAB — PSA, MEDICARE: PSA: 2.34 ng/ml (ref 0.10–4.00)

## 2017-01-26 NOTE — Addendum Note (Signed)
Addended by: Davis Gourd on: 01/26/2017 02:11 PM   Modules accepted: Orders

## 2017-01-26 NOTE — Assessment & Plan Note (Signed)
Pt's PE WNL w/ exception of known obesity.  UTD on colonoscopy.  Due for PSA.  Due for repeat Pneumovax- given.  Written screening schedule updated and given to pt.  Check labs.  Anticipatory guidance provided.

## 2017-01-26 NOTE — Assessment & Plan Note (Signed)
Chronic problem.  UTD on eye exam, foot exam.  On ACE for renal protection.  Check labs.  Adjust meds prn

## 2017-01-26 NOTE — Patient Instructions (Addendum)
Follow up in 3-4 months to recheck diabetes We'll notify you of your lab results and make any changes if needed Keep up the good work!  You look great!!! Call with any questions or concerns [place]!!!  Bring a copy of your advance directives to your next office visit.  Continue doing brain stimulating activities (puzzles, reading, adult coloring books, staying active) to keep memory sharp.    Marvin Foster , Thank you for taking time to come for your Medicare Wellness Visit. I appreciate your ongoing commitment to your health goals. Please review the following plan we discussed and let me know if I can assist you in the future.     This is a list of the screening recommended for you and due dates:  Health Maintenance  Topic Date Due  . Complete foot exam   02/11/2017  . Hemoglobin A1C  03/24/2017  . Flu Shot  05/11/2017  . Eye exam for diabetics  11/24/2017  . Colon Cancer Screening  06/25/2018  . Tetanus Vaccine  08/28/2024  . Pneumonia vaccines  Completed     Health Maintenance, Male A healthy lifestyle and preventive care is important for your health and wellness. Ask your health care provider about what schedule of regular examinations is right for you. What should I know about weight and diet?  Eat a Healthy Diet  Eat plenty of vegetables, fruits, whole grains, low-fat dairy products, and lean protein.  Do not eat a lot of foods high in solid fats, added sugars, or salt. Maintain a Healthy Weight  Regular exercise can help you achieve or maintain a healthy weight. You should:  Do at least 150 minutes of exercise each week. The exercise should increase your heart rate and make you sweat (moderate-intensity exercise).  Do strength-training exercises at least twice a week. Watch Your Levels of Cholesterol and Blood Lipids  Have your blood tested for lipids and cholesterol every 5 years starting at 81 years of age. If you are at high risk for heart disease, you should  start having your blood tested when you are 81 years old. You may need to have your cholesterol levels checked more often if:  Your lipid or cholesterol levels are high.  You are older than 81 years of age.  You are at high risk for heart disease. What should I know about cancer screening? Many types of cancers can be detected early and may often be prevented. Lung Cancer  You should be screened every year for lung cancer if:  You are a current smoker who has smoked for at least 30 years.  You are a former smoker who has quit within the past 15 years.  Talk to your health care provider about your screening options, when you should start screening, and how often you should be screened. Colorectal Cancer  Routine colorectal cancer screening usually begins at 81 years of age and should be repeated every 5-10 years until you are 81 years old. You may need to be screened more often if early forms of precancerous polyps or small growths are found. Your health care provider may recommend screening at an earlier age if you have risk factors for colon cancer.  Your health care provider may recommend using home test kits to check for hidden blood in the stool.  A small camera at the end of a tube can be used to examine your colon (sigmoidoscopy or colonoscopy). This checks for the earliest forms of colorectal cancer. Prostate and Testicular  Cancer  Depending on your age and overall health, your health care provider may do certain tests to screen for prostate and testicular cancer.  Talk to your health care provider about any symptoms or concerns you have about testicular or prostate cancer. Skin Cancer  Check your skin from head to toe regularly.  Tell your health care provider about any new moles or changes in moles, especially if:  There is a change in a mole's size, shape, or color.  You have a mole that is larger than a pencil eraser.  Always use sunscreen. Apply sunscreen liberally  and repeat throughout the day.  Protect yourself by wearing long sleeves, pants, a wide-brimmed hat, and sunglasses when outside. What should I know about heart disease, diabetes, and high blood pressure?  If you are 58-65 years of age, have your blood pressure checked every 3-5 years. If you are 20 years of age or older, have your blood pressure checked every year. You should have your blood pressure measured twice-once when you are at a hospital or clinic, and once when you are not at a hospital or clinic. Record the average of the two measurements. To check your blood pressure when you are not at a hospital or clinic, you can use:  An automated blood pressure machine at a pharmacy.  A home blood pressure monitor.  Talk to your health care provider about your target blood pressure.  If you are between 54-76 years old, ask your health care provider if you should take aspirin to prevent heart disease.  Have regular diabetes screenings by checking your fasting blood sugar level.  If you are at a normal weight and have a low risk for diabetes, have this test once every three years after the age of 53.  If you are overweight and have a high risk for diabetes, consider being tested at a younger age or more often.  A one-time screening for abdominal aortic aneurysm (AAA) by ultrasound is recommended for men aged 58-75 years who are current or former smokers. What should I know about preventing infection? Hepatitis B  If you have a higher risk for hepatitis B, you should be screened for this virus. Talk with your health care provider to find out if you are at risk for hepatitis B infection. Hepatitis C  Blood testing is recommended for:  Everyone born from [birth year] through 1963.  Anyone with known risk factors for hepatitis C. Sexually Transmitted Diseases (STDs)  You should be screened each year for STDs including gonorrhea and chlamydia if:  You are sexually active and are younger than 81  years of age.  You are older than 81 years of age and your health care provider tells you that you are at risk for this type of infection.  Your sexual activity has changed since you were last screened and you are at an increased risk for chlamydia or gonorrhea. Ask your health care provider if you are at risk.  Talk with your health care provider about whether you are at high risk of being infected with HIV. Your health care provider may recommend a prescription medicine to help prevent HIV infection. What else can I do?  Schedule regular health, dental, and eye exams.  Stay current with your vaccines (immunizations).  Do not use any tobacco products, such as cigarettes, chewing tobacco, and e-cigarettes. If you need help quitting, ask your health care provider.  Limit alcohol intake to no more than 2 drinks per day. One drink  equals 12 ounces of beer, 5 ounces of wine, or 1 ounces of hard liquor.  Do not use street drugs.  Do not share needles.  Ask your health care provider for help if you need support or information about quitting drugs.  Tell your health care provider if you often feel depressed.  Tell your health care provider if you have ever been abused or do not feel safe at home. This information is not intended to replace advice given to you by your health care provider. Make sure you discuss any questions you have with your health care provider. Document Released: 03/25/2008 Document Revised: 05/26/2016 Document Reviewed: 07/01/2015 Elsevier Interactive Patient Education  2017 [employer].

## 2017-01-26 NOTE — Progress Notes (Signed)
   Subjective:    Patient ID: Marvin Foster, male    DOB: 24-May-1934, 84 y.o.   MRN: [id number]  HPI CPE- UTD on colonoscopy, eye exam, foot exam.  Due for PSA.  Home CBGs ranging from 110-130.  Denies symptomatic lows.  Pt is not taking Levothyroxine- 'i don't know anything about it'.   Review of Systems Patient reports no vision/hearing changes, anorexia, fever ,adenopathy, persistant/recurrent hoarseness, swallowing issues, chest pain, palpitations, edema, persistant/recurrent cough, hemoptysis, dyspnea (rest,exertional, paroxysmal nocturnal), gastrointestinal  bleeding (melena, rectal bleeding), abdominal pain, excessive heart burn, GU symptoms (dysuria, hematuria, voiding/incontinence issues) syncope, focal weakness, memory loss, numbness & tingling, skin/hair/nail changes, depression, anxiety, abnormal bruising/bleeding, musculoskeletal symptoms/signs.     Objective:   Physical Exam General Appearance:    Alert, cooperative, no distress, appears stated age, obese  Head:    Normocephalic, without obvious abnormality, atraumatic  Eyes:    PERRL, conjunctiva/corneas clear, EOM's intact, fundi    benign, both eyes       Ears:    Normal TM's and external ear canals, both ears  Nose:   Nares normal, septum midline, mucosa normal, no drainage   or sinus tenderness  Throat:   Lips, mucosa, and tongue normal; teeth and gums normal  Neck:   Supple, symmetrical, trachea midline, no adenopathy;       thyroid:  No enlargement/tenderness/nodules  Back:     Symmetric, no curvature, ROM normal, no CVA tenderness  Lungs:     Clear to auscultation bilaterally, respirations unlabored  Chest wall:    No tenderness or deformity  Heart:    Regular rate and rhythm, S1 and S2 normal, no murmur, rub   or gallop  Abdomen:     Soft, non-tender, bowel sounds active all four quadrants,    no masses, no organomegaly  Genitalia:    deferred  Rectal:    Extremities:   Extremities normal, atraumatic, no  cyanosis or edema  Pulses:   2+ and symmetric all extremities  Skin:   Skin color, texture, turgor normal, no rashes or lesions  Lymph nodes:   Cervical, supraclavicular, and axillary nodes normal  Neurologic:   CNII-XII intact. Normal strength, sensation and reflexes      throughout          Assessment & Plan:

## 2017-01-27 ENCOUNTER — Other Ambulatory Visit: Payer: Self-pay | Admitting: Family Medicine

## 2017-01-27 ENCOUNTER — Other Ambulatory Visit: Payer: Self-pay | Admitting: General Practice

## 2017-01-27 DIAGNOSIS — E039 Hypothyroidism, unspecified: Secondary | ICD-10-CM

## 2017-01-27 MED ORDER — LEVOTHYROXINE SODIUM 50 MCG PO TABS: 50.0000 ug | ORAL_TABLET | Freq: Every day | ORAL | 1 refills | Status: DC

## 2017-02-01 DIAGNOSIS — H401112 Primary open-angle glaucoma, right eye, moderate stage: Secondary | ICD-10-CM | POA: Diagnosis not present

## 2017-02-01 DIAGNOSIS — H401123 Primary open-angle glaucoma, left eye, severe stage: Secondary | ICD-10-CM | POA: Diagnosis not present

## 2017-02-01 DIAGNOSIS — H1851 Endothelial corneal dystrophy: Secondary | ICD-10-CM | POA: Diagnosis not present

## 2017-02-03 ENCOUNTER — Ambulatory Visit (INDEPENDENT_AMBULATORY_CARE_PROVIDER_SITE_OTHER): Payer: Medicare HMO | Admitting: Pharmacist

## 2017-02-03 DIAGNOSIS — I4891 Unspecified atrial fibrillation: Secondary | ICD-10-CM

## 2017-02-03 DIAGNOSIS — Z5181 Encounter for therapeutic drug level monitoring: Secondary | ICD-10-CM | POA: Diagnosis not present

## 2017-02-03 DIAGNOSIS — Z7901 Long term (current) use of anticoagulants: Secondary | ICD-10-CM

## 2017-02-03 LAB — POCT INR: INR: 3.1

## 2017-02-15 ENCOUNTER — Telehealth: Payer: Self-pay | Admitting: Family Medicine

## 2017-02-15 NOTE — Telephone Encounter (Signed)
Patient notified of PCP recommendations and is agreement and expresses an understanding.  

## 2017-02-15 NOTE — Telephone Encounter (Signed)
It is possible that he's reacting to the medication.  Please have him stop this for 3 days and then call back or MyChart and let us know how he's feeling

## 2017-02-15 NOTE — Telephone Encounter (Signed)
Pt states that he is having unsteady walking, urinating a lot more and feeling nausea since he has started his thyroid Rx and asking if this has anything to with this medication. Pt would like a call back.

## 2017-02-15 NOTE — Telephone Encounter (Signed)
Do you think this could be a levothyroxine reaction?

## 2017-02-18 NOTE — Telephone Encounter (Signed)
Patient calling to report since stopping his medication has advised, he feels like his is back to his normal self.  He wants to know what Dr. Birdie Riddle suggest he do going forward.

## 2017-02-18 NOTE — Telephone Encounter (Signed)
Continue to hold thyroid medication and check TSH at lab only visit in 2 weeks

## 2017-02-18 NOTE — Telephone Encounter (Signed)
Patient notified of PCP recommendations and is agreement and expresses an understanding.  

## 2017-02-28 DIAGNOSIS — R69 Illness, unspecified: Secondary | ICD-10-CM | POA: Diagnosis not present

## 2017-03-01 ENCOUNTER — Other Ambulatory Visit: Payer: Self-pay | Admitting: Cardiology

## 2017-03-03 ENCOUNTER — Other Ambulatory Visit (INDEPENDENT_AMBULATORY_CARE_PROVIDER_SITE_OTHER): Payer: Medicare HMO

## 2017-03-03 DIAGNOSIS — E039 Hypothyroidism, unspecified: Secondary | ICD-10-CM

## 2017-03-03 LAB — TSH: TSH: 6.19 u[IU]/mL — ABNORMAL HIGH (ref 0.35–4.50)

## 2017-03-04 NOTE — Progress Notes (Signed)
Called pt and lmovm to return call.

## 2017-03-06 DIAGNOSIS — R69 Illness, unspecified: Secondary | ICD-10-CM | POA: Diagnosis not present

## 2017-03-08 ENCOUNTER — Telehealth: Payer: Self-pay | Admitting: Family Medicine

## 2017-03-08 DIAGNOSIS — R7989 Other specified abnormal findings of blood chemistry: Secondary | ICD-10-CM

## 2017-03-08 DIAGNOSIS — R69 Illness, unspecified: Secondary | ICD-10-CM | POA: Diagnosis not present

## 2017-03-08 MED ORDER — LEVOTHYROXINE SODIUM 25 MCG PO TABS: 25.0000 ug | ORAL_TABLET | Freq: Every day | ORAL | 3 refills | Status: DC

## 2017-03-08 NOTE — Telephone Encounter (Signed)
Medication filled to pharmacy as requested.  mychart sent as requested.

## 2017-03-08 NOTE — Telephone Encounter (Signed)
Patient states he has reviewed his results on mychart and saw the message from Dr. Birdie Riddle to resume levothyroxine.  However take 25mg  instead of 50.  He states he is not able to cut medication in half due to the oblong shape.  He is instead requesting a new rx be sent to the pharmacy for levothyroxine 25mg .  Patient requesting a note sent to him via mychart when this has been done so he know when to go pick it up from the pharmacy.  Pharmacy:  [place], [place]. [phone] (Phone) [phone] (Fax)

## 2017-03-08 NOTE — Addendum Note (Signed)
Addended by: Davis Gourd on: 03/08/2017 08:48 AM   Modules accepted: Orders

## 2017-03-10 ENCOUNTER — Other Ambulatory Visit: Payer: Self-pay | Admitting: Family Medicine

## 2017-03-10 ENCOUNTER — Ambulatory Visit (INDEPENDENT_AMBULATORY_CARE_PROVIDER_SITE_OTHER): Payer: Medicare HMO

## 2017-03-10 DIAGNOSIS — I4891 Unspecified atrial fibrillation: Secondary | ICD-10-CM

## 2017-03-10 DIAGNOSIS — Z5181 Encounter for therapeutic drug level monitoring: Secondary | ICD-10-CM | POA: Diagnosis not present

## 2017-03-10 DIAGNOSIS — Z7901 Long term (current) use of anticoagulants: Secondary | ICD-10-CM

## 2017-03-10 LAB — POCT INR: INR: 3.3

## 2017-03-30 ENCOUNTER — Other Ambulatory Visit: Payer: Self-pay | Admitting: Family Medicine

## 2017-03-30 ENCOUNTER — Other Ambulatory Visit (INDEPENDENT_AMBULATORY_CARE_PROVIDER_SITE_OTHER): Payer: Medicare HMO

## 2017-03-30 DIAGNOSIS — R7989 Other specified abnormal findings of blood chemistry: Secondary | ICD-10-CM

## 2017-03-30 DIAGNOSIS — R946 Abnormal results of thyroid function studies: Secondary | ICD-10-CM | POA: Diagnosis not present

## 2017-03-30 DIAGNOSIS — E039 Hypothyroidism, unspecified: Secondary | ICD-10-CM

## 2017-03-30 LAB — TSH: TSH: 5.04 u[IU]/mL — ABNORMAL HIGH (ref 0.35–4.50)

## 2017-03-31 ENCOUNTER — Ambulatory Visit (INDEPENDENT_AMBULATORY_CARE_PROVIDER_SITE_OTHER): Payer: Medicare HMO

## 2017-03-31 DIAGNOSIS — Z7901 Long term (current) use of anticoagulants: Secondary | ICD-10-CM | POA: Diagnosis not present

## 2017-03-31 DIAGNOSIS — Z5181 Encounter for therapeutic drug level monitoring: Secondary | ICD-10-CM | POA: Diagnosis not present

## 2017-03-31 DIAGNOSIS — I4891 Unspecified atrial fibrillation: Secondary | ICD-10-CM

## 2017-03-31 LAB — POCT INR: INR: 2.2

## 2017-04-20 ENCOUNTER — Telehealth: Payer: Self-pay | Admitting: Family Medicine

## 2017-04-20 DIAGNOSIS — E1139 Type 2 diabetes mellitus with other diabetic ophthalmic complication: Secondary | ICD-10-CM

## 2017-04-20 NOTE — Telephone Encounter (Signed)
Referral placed. Specified Any podiatry except [facility]

## 2017-04-20 NOTE — Telephone Encounter (Signed)
Please advise? Pt was referred for toe pain in the past, has DM ok to refer for the later as the Dx?

## 2017-04-20 NOTE — Telephone Encounter (Signed)
Ok to refer to new podiatrist for DM

## 2017-04-20 NOTE — Telephone Encounter (Signed)
Pt asking for a referral to podiatrist, pt states that he does not want to go back to [facility].

## 2017-04-27 ENCOUNTER — Encounter: Payer: Self-pay | Admitting: Family Medicine

## 2017-04-27 ENCOUNTER — Ambulatory Visit (INDEPENDENT_AMBULATORY_CARE_PROVIDER_SITE_OTHER): Payer: Medicare HMO | Admitting: Family Medicine

## 2017-04-27 ENCOUNTER — Ambulatory Visit (INDEPENDENT_AMBULATORY_CARE_PROVIDER_SITE_OTHER): Payer: Medicare HMO | Admitting: Pharmacist

## 2017-04-27 VITALS — BP 124/78 | HR 77 | Temp 97.9°F | Resp 16 | Ht 70.0 in | Wt 241.0 lb

## 2017-04-27 DIAGNOSIS — E1139 Type 2 diabetes mellitus with other diabetic ophthalmic complication: Secondary | ICD-10-CM | POA: Diagnosis not present

## 2017-04-27 DIAGNOSIS — Z5181 Encounter for therapeutic drug level monitoring: Secondary | ICD-10-CM | POA: Diagnosis not present

## 2017-04-27 DIAGNOSIS — I4891 Unspecified atrial fibrillation: Secondary | ICD-10-CM

## 2017-04-27 DIAGNOSIS — Z7901 Long term (current) use of anticoagulants: Secondary | ICD-10-CM

## 2017-04-27 LAB — BASIC METABOLIC PANEL
BUN: 18 mg/dL (ref 6–23)
CO2: 26 mEq/L (ref 19–32)
Calcium: 9.4 mg/dL (ref 8.4–10.5)
Chloride: 105 mEq/L (ref 96–112)
Creatinine, Ser: 1.05 mg/dL (ref 0.40–1.50)
GFR: 71.98 mL/min (ref >60.00)
Glucose, Bld: 109 mg/dL — ABNORMAL HIGH (ref 70–99)
Potassium: 5.5 mEq/L — ABNORMAL HIGH (ref 3.5–5.1)
Sodium: 139 mEq/L (ref 135–145)

## 2017-04-27 LAB — TSH: TSH: 3.48 u[IU]/mL (ref 0.35–4.50)

## 2017-04-27 LAB — POCT INR: INR: 1.6

## 2017-04-27 LAB — HEMOGLOBIN A1C: Hgb A1c MFr Bld: 6.9 % — ABNORMAL HIGH (ref 4.6–6.5)

## 2017-04-27 NOTE — Patient Instructions (Signed)
Follow up in 3-4 months to recheck sugar, BP, and cholesterol We'll notify you of your lab results and make any changes if needed Continue to work on healthy diet and regular exercise- you can do it!!! Call with any questions or concerns Have a great summer!!!

## 2017-04-27 NOTE — Assessment & Plan Note (Signed)
Chronic problem.  Hx of good control.  Pt admits to dietary indiscretions on his recent cruise.  UTD on eye exam, foot exam scheduled.  On ACE for renal protection.  Stressed need for healthy diet and regular exercise.  Check labs.  Adjust meds prn

## 2017-04-27 NOTE — Progress Notes (Signed)
   Subjective:    Patient ID: Marvin Foster, male    DOB: 12/05/1933, 83 y.o.   MRN: [id number]  HPI DM- chronic problem, on Metformin BID.  On ACE for renal protection.  UTD on eye exam.  Has appt upcoming w/ podiatry- scheduled for early August at [place].  Has gained 4 lbs since last visit but just returned from a cruise.  No CP, SOB, HAs, visual changes, abd pain, N/V.  Denies symptomatic lows.  No numbness/tingling of hands/feet.     Review of Systems For ROS see HPI     Objective:   Physical Exam  Constitutional: He is oriented to person, place, and time. He appears well-developed and well-nourished. No distress.  HENT:  Head: Normocephalic and atraumatic.  Eyes: Pupils are equal, round, and reactive to light. Conjunctivae and EOM are normal.  Neck: Normal range of motion. Neck supple. No thyromegaly present.  Cardiovascular: Normal rate, regular rhythm, normal heart sounds and intact distal pulses.   No murmur heard. Pulmonary/Chest: Effort normal and breath sounds normal. No respiratory distress.  Abdominal: Soft. Bowel sounds are normal. He exhibits no distension.  Musculoskeletal: He exhibits no edema.  Lymphadenopathy:    He has no cervical adenopathy.  Neurological: He is alert and oriented to person, place, and time. No cranial nerve deficit.  Skin: Skin is warm and dry.  Psychiatric: He has a normal mood and affect. His behavior is normal.  Vitals reviewed.         Assessment & Plan:

## 2017-04-27 NOTE — Progress Notes (Signed)
Pre visit review using our clinic review tool, if applicable. No additional management support is needed unless otherwise documented below in the visit note. 

## 2017-04-28 ENCOUNTER — Other Ambulatory Visit: Payer: Medicare HMO

## 2017-04-28 ENCOUNTER — Other Ambulatory Visit: Payer: Self-pay | Admitting: Family Medicine

## 2017-04-28 ENCOUNTER — Telehealth: Payer: Self-pay | Admitting: Family Medicine

## 2017-04-28 DIAGNOSIS — Z961 Presence of intraocular lens: Secondary | ICD-10-CM | POA: Diagnosis not present

## 2017-04-28 DIAGNOSIS — E875 Hyperkalemia: Secondary | ICD-10-CM

## 2017-04-28 DIAGNOSIS — H401112 Primary open-angle glaucoma, right eye, moderate stage: Secondary | ICD-10-CM | POA: Diagnosis not present

## 2017-04-28 DIAGNOSIS — H401123 Primary open-angle glaucoma, left eye, severe stage: Secondary | ICD-10-CM | POA: Diagnosis not present

## 2017-04-28 DIAGNOSIS — Z947 Corneal transplant status: Secondary | ICD-10-CM | POA: Diagnosis not present

## 2017-04-28 DIAGNOSIS — H1851 Endothelial corneal dystrophy: Secondary | ICD-10-CM | POA: Diagnosis not present

## 2017-04-28 NOTE — Telephone Encounter (Signed)
Pt asking is he to continue thyroid medication since labs look good and also asking about his potassium and is there anything he should do to help it

## 2017-04-28 NOTE — Telephone Encounter (Signed)
Called and left a detailed message for pt to inform: continue thyroid medication. There is nothing we can do to help with the potassium. Pt needs a repeat lab visit in 2 weeks.

## 2017-04-28 NOTE — Progress Notes (Signed)
Called pt and lmovm to return call.

## 2017-05-12 ENCOUNTER — Other Ambulatory Visit (INDEPENDENT_AMBULATORY_CARE_PROVIDER_SITE_OTHER): Payer: Medicare HMO

## 2017-05-12 DIAGNOSIS — E875 Hyperkalemia: Secondary | ICD-10-CM | POA: Diagnosis not present

## 2017-05-16 DIAGNOSIS — R69 Illness, unspecified: Secondary | ICD-10-CM | POA: Diagnosis not present

## 2017-05-17 ENCOUNTER — Telehealth: Payer: Self-pay | Admitting: Family Medicine

## 2017-05-17 NOTE — Telephone Encounter (Signed)
Patient is aware that labs have not resulted yet, and that we will call him as soon as we have an answer.   I have contacted the lab to see why these have not resulted, and they are working on it and getting back to me.

## 2017-05-17 NOTE — Telephone Encounter (Signed)
Patient called because he wants his results from the lab work done on 05/12/17. Please advise.

## 2017-05-19 ENCOUNTER — Encounter: Payer: Self-pay | Admitting: Podiatry

## 2017-05-19 ENCOUNTER — Ambulatory Visit (INDEPENDENT_AMBULATORY_CARE_PROVIDER_SITE_OTHER): Payer: Medicare HMO

## 2017-05-19 ENCOUNTER — Ambulatory Visit (INDEPENDENT_AMBULATORY_CARE_PROVIDER_SITE_OTHER): Payer: Medicare HMO | Admitting: Podiatry

## 2017-05-19 VITALS — BP 149/82 | HR 86

## 2017-05-19 DIAGNOSIS — M2041 Other hammer toe(s) (acquired), right foot: Secondary | ICD-10-CM

## 2017-05-19 DIAGNOSIS — M2042 Other hammer toe(s) (acquired), left foot: Secondary | ICD-10-CM | POA: Diagnosis not present

## 2017-05-19 DIAGNOSIS — M2012 Hallux valgus (acquired), left foot: Secondary | ICD-10-CM

## 2017-05-19 DIAGNOSIS — Z7901 Long term (current) use of anticoagulants: Secondary | ICD-10-CM | POA: Diagnosis not present

## 2017-05-19 DIAGNOSIS — Z5181 Encounter for therapeutic drug level monitoring: Secondary | ICD-10-CM

## 2017-05-19 DIAGNOSIS — E119 Type 2 diabetes mellitus without complications: Secondary | ICD-10-CM

## 2017-05-19 DIAGNOSIS — I4891 Unspecified atrial fibrillation: Secondary | ICD-10-CM

## 2017-05-19 DIAGNOSIS — M2011 Hallux valgus (acquired), right foot: Secondary | ICD-10-CM

## 2017-05-19 LAB — HM DIABETES FOOT EXAM

## 2017-05-19 LAB — POCT INR: INR: 1.7

## 2017-05-19 NOTE — Progress Notes (Signed)
   Subjective:    Patient ID: Marvin Foster, male    DOB: [birth year], 83 y.o.   MRN: [id number]  HPI this patient presents the office with chief complaint of painful toes on his right foot.  He says his toes are painful walking and wearing his new shoes.  He says the shoes are narrow in the toebox area.  He also says that he has pain in the right great toe for approximately 4 weeks.   He states he is not has provided no self treatment nor sought any professional help.  This patient is diabetic.  He presents the office today for an evaluation of his diabetic feet.    Review of Systems  All other systems reviewed and are negative.      Objective:   Physical Exam GENERAL APPEARANCE: Alert, conversant. Appropriately groomed. No acute distress.  VASCULAR: Pedal pulses are  palpable at  [facility] and PT bilateral.  Capillary refill time is immediate to all digits,  Normal temperature gradient.  Venous stasis legs  B/L NEUROLOGIC: sensation is normal to 5.07 monofilament at 5/5 sites bilateral.  Light touch is intact bilateral, Muscle strength normal.  MUSCULOSKELETAL: acceptable muscle strength, tone and stability bilateral.  Severe HAV first MPJ bilateral, as well as hammertoes second bilateral. Second digit is overlapping bunion second toe right foot.   DERMATOLOGIC: skin color, texture, and turgor are within normal limits.  No preulcerative lesions or ulcers  are seen, no interdigital maceration noted.  No open lesions present.  Digital nails are asymptomatic. No drainage noted.         Assessment & Plan:  HAV  B/L with hammer toes second  B/L  IE  Provided diabetic foot exam.  Patient does not qualify for diabetic shoes.  Discussed his toe pathology and the shoes that he is wearing. Told him he needs a wider toe box and patient presents the office as needed   Gardiner Barefoot DPM

## 2017-05-19 NOTE — Progress Notes (Signed)
   Subjective:    Patient ID: Marvin Foster, male    DOB: October 18, 1933, 83 y.o.   MRN: [id number]  HPI    Review of Systems     Objective:   Physical Exam        Assessment & Plan:

## 2017-05-23 ENCOUNTER — Other Ambulatory Visit: Payer: Self-pay | Admitting: *Deleted

## 2017-05-23 DIAGNOSIS — E875 Hyperkalemia: Secondary | ICD-10-CM

## 2017-05-23 NOTE — Telephone Encounter (Signed)
Due to issues with lab testing, patient needed to be called back for redraw.  Patient has been made aware and he has been scheduled for redraw at the [facility] office as this is closer to the patient's home.   Orders have been placed for repeat BMET.

## 2017-05-24 ENCOUNTER — Other Ambulatory Visit (INDEPENDENT_AMBULATORY_CARE_PROVIDER_SITE_OTHER): Payer: Medicare HMO

## 2017-05-24 DIAGNOSIS — E875 Hyperkalemia: Secondary | ICD-10-CM

## 2017-05-24 LAB — BASIC METABOLIC PANEL
BUN: 14 mg/dL (ref 6–23)
CO2: 25 mEq/L (ref 19–32)
Calcium: 9 mg/dL (ref 8.4–10.5)
Chloride: 105 mEq/L (ref 96–112)
Creatinine, Ser: 0.95 mg/dL (ref 0.40–1.50)
GFR: 80.78 mL/min (ref >60.00)
Glucose, Bld: 144 mg/dL — ABNORMAL HIGH (ref 70–99)
Potassium: 4.8 mEq/L (ref 3.5–5.1)
Sodium: 141 mEq/L (ref 135–145)

## 2017-06-09 ENCOUNTER — Ambulatory Visit (INDEPENDENT_AMBULATORY_CARE_PROVIDER_SITE_OTHER): Payer: Medicare HMO | Admitting: *Deleted

## 2017-06-09 DIAGNOSIS — I4891 Unspecified atrial fibrillation: Secondary | ICD-10-CM

## 2017-06-09 DIAGNOSIS — Z7901 Long term (current) use of anticoagulants: Secondary | ICD-10-CM | POA: Diagnosis not present

## 2017-06-09 DIAGNOSIS — Z5181 Encounter for therapeutic drug level monitoring: Secondary | ICD-10-CM | POA: Diagnosis not present

## 2017-06-09 LAB — POCT INR: INR: 2.4

## 2017-07-02 ENCOUNTER — Other Ambulatory Visit: Payer: Self-pay | Admitting: Family Medicine

## 2017-07-07 ENCOUNTER — Ambulatory Visit (INDEPENDENT_AMBULATORY_CARE_PROVIDER_SITE_OTHER): Payer: Medicare HMO | Admitting: *Deleted

## 2017-07-07 DIAGNOSIS — I4891 Unspecified atrial fibrillation: Secondary | ICD-10-CM | POA: Diagnosis not present

## 2017-07-07 DIAGNOSIS — Z5181 Encounter for therapeutic drug level monitoring: Secondary | ICD-10-CM

## 2017-07-07 LAB — POCT INR: INR: 4.5

## 2017-07-07 LAB — PROTIME-INR
INR: 4.6 — ABNORMAL HIGH (ref 0.8–1.2)
Prothrombin Time: 49.3 s — ABNORMAL HIGH (ref 9.1–12.0)

## 2017-07-14 ENCOUNTER — Other Ambulatory Visit: Payer: Self-pay | Admitting: Cardiology

## 2017-07-14 ENCOUNTER — Ambulatory Visit (INDEPENDENT_AMBULATORY_CARE_PROVIDER_SITE_OTHER): Payer: Medicare HMO | Admitting: *Deleted

## 2017-07-14 DIAGNOSIS — Z5181 Encounter for therapeutic drug level monitoring: Secondary | ICD-10-CM

## 2017-07-14 DIAGNOSIS — I4891 Unspecified atrial fibrillation: Secondary | ICD-10-CM | POA: Diagnosis not present

## 2017-07-14 LAB — POCT INR: INR: 2.5

## 2017-07-26 DIAGNOSIS — Z23 Encounter for immunization: Secondary | ICD-10-CM | POA: Diagnosis not present

## 2017-07-27 ENCOUNTER — Other Ambulatory Visit: Payer: Self-pay | Admitting: Cardiology

## 2017-07-29 DIAGNOSIS — R69 Illness, unspecified: Secondary | ICD-10-CM | POA: Diagnosis not present

## 2017-08-03 ENCOUNTER — Ambulatory Visit (INDEPENDENT_AMBULATORY_CARE_PROVIDER_SITE_OTHER): Payer: Medicare HMO | Admitting: *Deleted

## 2017-08-03 DIAGNOSIS — H1851 Endothelial corneal dystrophy: Secondary | ICD-10-CM | POA: Diagnosis not present

## 2017-08-03 DIAGNOSIS — Z947 Corneal transplant status: Secondary | ICD-10-CM | POA: Diagnosis not present

## 2017-08-03 DIAGNOSIS — Z5181 Encounter for therapeutic drug level monitoring: Secondary | ICD-10-CM

## 2017-08-03 DIAGNOSIS — I4891 Unspecified atrial fibrillation: Secondary | ICD-10-CM

## 2017-08-03 DIAGNOSIS — Z961 Presence of intraocular lens: Secondary | ICD-10-CM | POA: Diagnosis not present

## 2017-08-03 LAB — POCT INR: INR: 1.9

## 2017-08-18 ENCOUNTER — Other Ambulatory Visit: Payer: Self-pay | Admitting: Cardiology

## 2017-08-18 DIAGNOSIS — H401123 Primary open-angle glaucoma, left eye, severe stage: Secondary | ICD-10-CM | POA: Diagnosis not present

## 2017-08-18 DIAGNOSIS — H01115 Allergic dermatitis of left lower eyelid: Secondary | ICD-10-CM | POA: Diagnosis not present

## 2017-08-18 DIAGNOSIS — Z961 Presence of intraocular lens: Secondary | ICD-10-CM | POA: Diagnosis not present

## 2017-08-18 DIAGNOSIS — H401112 Primary open-angle glaucoma, right eye, moderate stage: Secondary | ICD-10-CM | POA: Diagnosis not present

## 2017-08-24 ENCOUNTER — Ambulatory Visit (INDEPENDENT_AMBULATORY_CARE_PROVIDER_SITE_OTHER): Payer: Medicare HMO | Admitting: *Deleted

## 2017-08-24 DIAGNOSIS — I4891 Unspecified atrial fibrillation: Secondary | ICD-10-CM

## 2017-08-24 DIAGNOSIS — Z5181 Encounter for therapeutic drug level monitoring: Secondary | ICD-10-CM

## 2017-08-24 LAB — POCT INR: INR: 2.7

## 2017-08-24 NOTE — Patient Instructions (Signed)
Continue same dose of coumadin 1/2 tablet (2.5mg ) daily except 1 tablet (5mg ) on Tuesdays and Saturdays Recheck INR in 4 weeks

## 2017-09-02 ENCOUNTER — Other Ambulatory Visit: Payer: Self-pay | Admitting: Family Medicine

## 2017-09-07 ENCOUNTER — Other Ambulatory Visit: Payer: Self-pay | Admitting: Family Medicine

## 2017-09-21 ENCOUNTER — Ambulatory Visit (INDEPENDENT_AMBULATORY_CARE_PROVIDER_SITE_OTHER): Payer: Medicare HMO | Admitting: *Deleted

## 2017-09-21 DIAGNOSIS — I4891 Unspecified atrial fibrillation: Secondary | ICD-10-CM | POA: Diagnosis not present

## 2017-09-21 DIAGNOSIS — Z5181 Encounter for therapeutic drug level monitoring: Secondary | ICD-10-CM

## 2017-09-21 LAB — POCT INR: INR: 2.5

## 2017-09-21 NOTE — Patient Instructions (Signed)
Continue same dose of coumadin 1/2 tablet (2.5mg ) daily except 1 tablet (5mg ) on Tuesdays and Saturdays Recheck INR in 4 weeks

## 2017-09-22 ENCOUNTER — Other Ambulatory Visit: Payer: Self-pay | Admitting: Family Medicine

## 2017-09-22 DIAGNOSIS — H01115 Allergic dermatitis of left lower eyelid: Secondary | ICD-10-CM | POA: Diagnosis not present

## 2017-10-20 ENCOUNTER — Encounter: Payer: Self-pay | Admitting: Cardiology

## 2017-10-20 ENCOUNTER — Ambulatory Visit: Payer: Medicare HMO | Admitting: Cardiology

## 2017-10-20 ENCOUNTER — Ambulatory Visit (INDEPENDENT_AMBULATORY_CARE_PROVIDER_SITE_OTHER): Payer: Medicare HMO | Admitting: *Deleted

## 2017-10-20 VITALS — BP 120/90 | HR 70 | Ht 70.0 in | Wt 234.0 lb

## 2017-10-20 DIAGNOSIS — I4891 Unspecified atrial fibrillation: Secondary | ICD-10-CM | POA: Diagnosis not present

## 2017-10-20 DIAGNOSIS — I480 Paroxysmal atrial fibrillation: Secondary | ICD-10-CM

## 2017-10-20 DIAGNOSIS — I10 Essential (primary) hypertension: Secondary | ICD-10-CM | POA: Diagnosis not present

## 2017-10-20 DIAGNOSIS — I2510 Atherosclerotic heart disease of native coronary artery without angina pectoris: Secondary | ICD-10-CM

## 2017-10-20 DIAGNOSIS — Z5181 Encounter for therapeutic drug level monitoring: Secondary | ICD-10-CM

## 2017-10-20 DIAGNOSIS — Z961 Presence of intraocular lens: Secondary | ICD-10-CM | POA: Diagnosis not present

## 2017-10-20 DIAGNOSIS — H1851 Endothelial corneal dystrophy: Secondary | ICD-10-CM | POA: Diagnosis not present

## 2017-10-20 DIAGNOSIS — H401123 Primary open-angle glaucoma, left eye, severe stage: Secondary | ICD-10-CM | POA: Diagnosis not present

## 2017-10-20 DIAGNOSIS — H401112 Primary open-angle glaucoma, right eye, moderate stage: Secondary | ICD-10-CM | POA: Diagnosis not present

## 2017-10-20 LAB — POCT INR: INR: 2.4

## 2017-10-20 NOTE — Patient Instructions (Signed)

## 2017-10-20 NOTE — Progress Notes (Signed)
[facility], [place]  [address], [place]  [address] Phone: [phone] Fax:  [phone]  Date:  10/20/2017   ID:  Yechezkel, Fertig 08-Oct-1934, MRN [id number]  PCP:  [name], MD   History of Present Illness: ANGELDEJESUS CALLAHAM is a 84 y.o. male with hx of coronary artery disease, proximal LAD 03/01/2005 stent, diabetes, hyperlipidemia, and atrial fibrillation.   He is currently Coumadin for atrial fibrillation. Metoprolol tartrate to 100 mg BID. He states he is doing well on the therapy without any symptoms of atrial fibrillation, nor med side effects.  His EKG on 08/12/16 demonstrated atrial fibrillation with good rate control once again, previously he was in sinus rhythm. No indications to cardiovert given his asymptomatic nature and probability of returning back to atrial fibrillation.   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    10/20/17- overall he is doing very well.  No chest pain, no shortness of breath, no bleeding, no syncope.  He has been having trouble with his eyes, glaucoma.  Trying several different eyedrops.  He walked the crop walk again this year.  Enjoys his volunteering at food shelter.  Has had no palpitations, no symptoms with his atrial fibrillation.    Wt Readings from Last 3 Encounters:  10/20/17 234 lb (106.1 kg)  04/27/17 241 lb (109.3 kg)  01/26/17 237 lb (107.5 kg)     Past Medical History:  Diagnosis Date  . Arthritis 12/2005   left hand  . Arthritis    Left knee  . Blood transfusion without reported diagnosis   . Bursitis of knee    right knee  . CAD (coronary artery disease)   . Diabetes mellitus   . Glaucoma    left eye  . Hyperlipidemia   . Hypertension   . Neuropathy    with pain  . Spinal stenosis 01/20/05    Past Surgical History:  Procedure Laterality Date  . BACK SURGERY    . CATARACT EXTRACTION  08/11/09   right  . corneal endothelial Left 2013  .  CORNEAL TRANSPLANT Left    eye  . CORONARY ANGIOPLASTY WITH STENT PLACEMENT    . EYE SURGERY    . SMALL INTESTINE SURGERY    . SPINE SURGERY    . VASECTOMY      Current Outpatient Medications  Medication Sig Dispense Refill  . aspirin EC 81 MG tablet Take by mouth.    [name] Kitchen atorvastatin (LIPITOR) 40 MG tablet TAKE 1 TABLET BY MOUTH AT BEDTIME 90 tablet 1  . brimonidine (ALPHAGAN) 0.2 % ophthalmic solution Apply to eye.    . Calcium Carbonate-Vitamin D (CALCIUM 600 + D PO) Take 1 tablet by mouth 2 (two) times daily.     . clindamycin (CLEOCIN) 300 MG capsule     . enalapril (VASOTEC) 5 MG tablet Take 1 tablet (5 mg total) daily by mouth. Please keep upcoming appt in January for future refills. Thank you 90 tablet 0  . fluorometholone (FML) 0.1 % ophthalmic suspension     . FLUZONE HIGH-DOSE 0.5 ML injection Inject as directed once.  0  . glucose blood (ONETOUCH VERIO) test strip Use as instructed 100 each 12  . Lancets (ONETOUCH ULTRASOFT) lancets Use one lancet each time sugars are tested, pt tests sugars twice daily. 100 each 12  . latanoprost (XALATAN) 0.005 % ophthalmic solution     . levothyroxine (  SYNTHROID, LEVOTHROID) 25 MCG tablet TAKE 1 TABLET BY MOUTH ONCE DAILY BEFORE  BREAKFAST 90 tablet 0  . metFORMIN (GLUCOPHAGE) 1000 MG tablet TAKE 1 TABLET BY MOUTH TWICE DAILY 180 tablet 1  . metoprolol tartrate (LOPRESSOR) 100 MG tablet TAKE 1 TABLET BY MOUTH TWICE DAILY 180 tablet 1  . Multiple Vitamin (MULTIVITAMIN) capsule Take 1 capsule by mouth daily.      . Omega-3 Fatty Acids (FISH OIL) 1200 MG CAPS Take 3 capsules by mouth daily.    . prednisoLONE acetate (PRED FORTE) 1 % ophthalmic suspension Place 1 drop into both eyes daily.     . timolol (TIMOPTIC) 0.5 % ophthalmic solution Apply to eye.    . warfarin (COUMADIN) 5 MG tablet TAKE AS DIRECTED BY  THE  COUMADIN  CLINIC 90 tablet 1   No current facility-administered medications for this visit.     Allergies:    Allergies    Allergen Reactions  . Neomycin-Bacitracin Zn-Polymyx Rash  . Penicillins Other (See Comments)    Pt states this was diagnosed years ago, unsure as to reaction. Will not use.     Social History:  The patient  reports that  has never smoked. he has never used smokeless tobacco. He reports that he drinks alcohol. He reports that he does not use drugs.   ROS:  Please see the history of present illness.   All other review of systems negative  PHYSICAL EXAM: VS:  BP 120/90   Pulse 70   Ht 5\' 10"  (1.778 m)   Wt 234 lb (106.1 kg)   SpO2 98%   BMI 33.58 kg/m  GEN: obese, Well nourished, well developed, in no acute distress  HEENT: normal  Neck: no JVD, carotid bruits, or masses Cardiac: irreg irreg; no murmurs, rubs, or gallops,no edema  Respiratory:  clear to auscultation bilaterally, normal work of breathing GI: soft, nontender, nondistended, + BS MS: no deformity or atrophy  Skin: warm and dry, no rash Neuro:  Alert and Oriented x 3, Strength and sensation are intact Psych: euthymic mood, full affect   EKG:   EKG 08/12/16-atrial fibrillation heart rate 74 with no other changes. Today 08/05/15 - 69, sinus rhythm, no other abnormalities personally viewed-priorSinus rhythm rate 80 with no other changes.     ASSESSMENT AND PLAN:  1. Coronary artery disease-currently stable, no exertional anginal symptoms, doing well. Proximal LAD 03/01/2005 stent. Stress test 2006 no ischemia 2. Atrial fibrillation-seems to have reverted back to atrial fibrillation. Previously had been maintaining sinus rhythm for quite some time. We will continue with anticoagulation. Only on metoprolol currently well controlled.  Continue with rate control strategy.  I do not think that a cardioversion would hold at this point. 3. Hyperlipidemia-atorvastatin. Goal LDL less than 70. LDL 49. 4. Chronic anticoagulation -doing very well on warfarin. No bleeding, Labs reviewed 5. Essential hypertension-currently well  controlled. Medications reviewed. No changes 6. Diabetes- medications reviewed. Per Dr. Birdie Riddle. Sees podiatry 7. Obesity- continue to work on weight loss.   Signed, Candee Furbish, MD [facility]  10/20/2017 11:12 AM

## 2017-10-20 NOTE — Patient Instructions (Signed)
Description   Continue same dose of coumadin 1/2 tablet (2.5mg) daily except 1 tablet (5mg) on Tuesdays and Saturdays.  Recheck INR in 6 weeks      

## 2017-10-27 ENCOUNTER — Ambulatory Visit (INDEPENDENT_AMBULATORY_CARE_PROVIDER_SITE_OTHER): Payer: Medicare HMO | Admitting: Family Medicine

## 2017-10-27 ENCOUNTER — Encounter: Payer: Self-pay | Admitting: Family Medicine

## 2017-10-27 ENCOUNTER — Other Ambulatory Visit: Payer: Self-pay

## 2017-10-27 VITALS — BP 122/88 | HR 78 | Temp 98.1°F | Resp 16 | Ht 70.0 in | Wt 233.1 lb

## 2017-10-27 DIAGNOSIS — R21 Rash and other nonspecific skin eruption: Secondary | ICD-10-CM

## 2017-10-27 DIAGNOSIS — E1139 Type 2 diabetes mellitus with other diabetic ophthalmic complication: Secondary | ICD-10-CM

## 2017-10-27 LAB — BASIC METABOLIC PANEL
BUN: 22 mg/dL (ref 6–23)
CO2: 27 mEq/L (ref 19–32)
Calcium: 9 mg/dL (ref 8.4–10.5)
Chloride: 104 mEq/L (ref 96–112)
Creatinine, Ser: 1.09 mg/dL (ref 0.40–1.50)
GFR: 68.85 mL/min (ref >60.00)
Glucose, Bld: 111 mg/dL — ABNORMAL HIGH (ref 70–99)
Potassium: 4.7 mEq/L (ref 3.5–5.1)
Sodium: 140 mEq/L (ref 135–145)

## 2017-10-27 LAB — CBC WITH DIFFERENTIAL/PLATELET
Basophils Absolute: 0.1 10*3/uL (ref 0.0–0.1)
Basophils Relative: 1.1 % (ref 0.0–3.0)
Eosinophils Absolute: 0.3 10*3/uL (ref 0.0–0.7)
Eosinophils Relative: 3.8 % (ref 0.0–5.0)
HCT: 40.9 % (ref 39.0–52.0)
Hemoglobin: 13.5 g/dL (ref 13.0–17.0)
Lymphocytes Relative: 34.2 % (ref 12.0–46.0)
Lymphs Abs: 2.3 10*3/uL (ref 0.7–4.0)
MCHC: 33 g/dL (ref 30.0–36.0)
MCV: 95.8 fl (ref 78.0–100.0)
Monocytes Absolute: 0.7 10*3/uL (ref 0.1–1.0)
Monocytes Relative: 9.8 % (ref 3.0–12.0)
Neutro Abs: 3.5 10*3/uL (ref 1.4–7.7)
Neutrophils Relative %: 51.1 % (ref 43.0–77.0)
Platelets: 149 10*3/uL — ABNORMAL LOW (ref 150.0–400.0)
RBC: 4.27 Mil/uL (ref 4.22–5.81)
RDW: 13.6 % (ref 11.5–15.5)
WBC: 6.8 10*3/uL (ref 4.0–10.5)

## 2017-10-27 LAB — LIPID PANEL
Cholesterol: 102 mg/dL (ref 0–200)
HDL: 30.3 mg/dL — ABNORMAL LOW (ref >39.00)
LDL Cholesterol: 40 mg/dL (ref 0–99)
NonHDL: 72.18
Total CHOL/HDL Ratio: 3
Triglycerides: 160 mg/dL — ABNORMAL HIGH (ref 0.0–149.0)
VLDL: 32 mg/dL (ref 0.0–40.0)

## 2017-10-27 LAB — HEPATIC FUNCTION PANEL
ALT: 18 U/L (ref 0–53)
AST: 20 U/L (ref 0–37)
Albumin: 4.1 g/dL (ref 3.5–5.2)
Alkaline Phosphatase: 46 U/L (ref 39–117)
Bilirubin, Direct: 0.3 mg/dL (ref 0.0–0.3)
Total Bilirubin: 1 mg/dL (ref 0.2–1.2)
Total Protein: 6.7 g/dL (ref 6.0–8.3)

## 2017-10-27 LAB — HEMOGLOBIN A1C: Hgb A1c MFr Bld: 6.8 % — ABNORMAL HIGH (ref 4.6–6.5)

## 2017-10-27 LAB — TSH: TSH: 5.11 u[IU]/mL — ABNORMAL HIGH (ref 0.35–4.50)

## 2017-10-27 MED ORDER — NYSTATIN 100000 UNIT/GM EX POWD: Freq: Three times a day (TID) | CUTANEOUS | 3 refills | Status: DC

## 2017-10-27 NOTE — Patient Instructions (Addendum)
Schedule your complete physical in 3-4 months and your Medicare Wellness Visit at the same time [facility] notify you of your lab results and make any changes if needed Apply the Nystatin powder to the area 3x daily Call with any questions or concerns Happy New Year!

## 2017-10-27 NOTE — Progress Notes (Signed)
   Subjective:    Patient ID: Marvin Foster, male    DOB: December 04, 1933, 84 y.o.   MRN: [id number]  HPI DM- chronic problem, on Metformin BID.  UTD on foot exam, eye exam, and on ACE for renal protection.  Yesterday's CBG 112, today 138.  Meter tells him he's in range 'every day'.  Denies CP, SOB, HAs, symptomatic lows.  No abd pain, N/V.  Rash- pt reports this is ongoing issue.  Will get itchy, painful rash in L panus fold in the groin (L>R).  Using medicated Gold Bond Powder 'religiously every day'.  Pt has neosporin allergy.   Review of Systems For ROS see HPI     Objective:   Physical Exam  Constitutional: He is oriented to person, place, and time. He appears well-developed and well-nourished. No distress.  obese  HENT:  Head: Normocephalic and atraumatic.  Eyes: Conjunctivae and EOM are normal. Pupils are equal, round, and reactive to light.  Neck: Normal range of motion. Neck supple. No thyromegaly present.  Cardiovascular: Normal rate, regular rhythm, normal heart sounds and intact distal pulses.  No murmur heard. Pulmonary/Chest: Effort normal and breath sounds normal. No respiratory distress.  Abdominal: Soft. Bowel sounds are normal. He exhibits no distension.  Musculoskeletal: He exhibits no edema.  Lymphadenopathy:    He has no cervical adenopathy.  Neurological: He is alert and oriented to person, place, and time. No cranial nerve deficit.  Skin: Skin is warm and dry. Rash (fungal rash in L groin pannus w/ satellite lesions) noted. There is erythema.  Psychiatric: He has a normal mood and affect. His behavior is normal.  Vitals reviewed.         Assessment & Plan:  Rash- new.  Pt's groin rash is consistent w/ fungal dermatitis.  Start Nystatin powder TID.  Reviewed need to keep area dry.  Will follow.

## 2017-10-27 NOTE — Assessment & Plan Note (Signed)
Chronic problem.  Hx of good control.  UTD on foot exam, eye exam, and on ACE for renal protection.  Tolerating Metformin w/o difficulty.  Check labs.  Adjust meds prn

## 2017-10-28 ENCOUNTER — Other Ambulatory Visit: Payer: Self-pay | Admitting: General Practice

## 2017-10-28 DIAGNOSIS — E039 Hypothyroidism, unspecified: Secondary | ICD-10-CM

## 2017-10-28 MED ORDER — LEVOTHYROXINE SODIUM 50 MCG PO TABS: 50.0000 ug | ORAL_TABLET | Freq: Every day | ORAL | 3 refills | Status: DC

## 2017-11-04 DIAGNOSIS — R69 Illness, unspecified: Secondary | ICD-10-CM | POA: Diagnosis not present

## 2017-11-15 ENCOUNTER — Telehealth: Payer: Self-pay | Admitting: *Deleted

## 2017-11-15 NOTE — Telephone Encounter (Signed)
Copied from [place] [id number]. Topic: Quick Communication - See Telephone Encounter >> Nov 14, 2017  5:08 PM Boyd Kerbs wrote: CRM for notification. See Telephone encounter for:   He does not want to do the Medicare Wellness Appt. He said he will speak to you about this, but it would have to be at his convenience.   11/14/17.

## 2017-11-20 ENCOUNTER — Other Ambulatory Visit: Payer: Self-pay | Admitting: Cardiology

## 2017-11-28 ENCOUNTER — Telehealth: Payer: Self-pay | Admitting: Family Medicine

## 2017-11-28 NOTE — Telephone Encounter (Signed)
Patient states that he was told by a retired physician that he thinks he has a lingering sinus infection. Patient wanted to know if we would just call in ABX.   Advised patient that he would need to be seen.  Patient agreed to an appointment and will be seen by Elyn Aquas tomorrow afternoon.  He needed an early afternoon appointment due to work.   Routing to PCP just so she is aware of phone call.

## 2017-11-28 NOTE — Telephone Encounter (Signed)
Copied from [place]. Topic: Quick Communication - See Telephone Encounter >> Nov 28, 2017  1:06 PM Cleaster Corin, NT wrote: CRM for notification. See Telephone encounter for:   11/28/17. Pt. Calling to see if he can have a rx. For antibiotics called in. Pt. Has finished meds. For flu but was still not 100%. Pt. Has also been taking mucinex Pt. Can be reached at [place], [address]. [place]. [place] [address] Phone: [phone] Fax: [phone]

## 2017-11-29 ENCOUNTER — Other Ambulatory Visit (INDEPENDENT_AMBULATORY_CARE_PROVIDER_SITE_OTHER): Payer: Medicare HMO

## 2017-11-29 ENCOUNTER — Encounter: Payer: Self-pay | Admitting: Physician Assistant

## 2017-11-29 ENCOUNTER — Other Ambulatory Visit: Payer: Self-pay

## 2017-11-29 ENCOUNTER — Ambulatory Visit (INDEPENDENT_AMBULATORY_CARE_PROVIDER_SITE_OTHER): Payer: Medicare HMO | Admitting: Physician Assistant

## 2017-11-29 VITALS — BP 118/70 | HR 79 | Temp 97.8°F | Resp 16 | Ht 70.0 in | Wt 231.0 lb

## 2017-11-29 DIAGNOSIS — R05 Cough: Secondary | ICD-10-CM

## 2017-11-29 DIAGNOSIS — R058 Other specified cough: Secondary | ICD-10-CM

## 2017-11-29 DIAGNOSIS — E039 Hypothyroidism, unspecified: Secondary | ICD-10-CM | POA: Diagnosis not present

## 2017-11-29 LAB — TSH: TSH: 2.3 u[IU]/mL (ref 0.35–4.50)

## 2017-11-29 MED ORDER — BENZONATATE 100 MG PO CAPS: 100.0000 mg | ORAL_CAPSULE | Freq: Three times a day (TID) | ORAL | 0 refills | Status: DC | PRN

## 2017-11-29 NOTE — Patient Instructions (Signed)
Please stay well-hydrated and get plenty of rest.  Symptoms should continue to improve and resolve.  Use the Tessalon as directed for cough. I have sent in a prescription. You can continue plain Mucinex. Get a Saline Nasal rinse to use once to twice daily to help with nasal drainage.  Follow-up if symptoms are not continuing to resolve or if new symptoms develop.

## 2017-11-29 NOTE — Progress Notes (Signed)
Patient presents to clinic today c/o potential flu like illness a week ago. Noted cough, chills, aches and fatigue. Notes symptoms are significantly improved.  Notes lingering fatigue with mild cough and rhinorrhea. Is taking plain Mucinex and Coricidin HBP over the weekend. Denies sinus pressure, sinus pain, fever.   Past Medical History:  Diagnosis Date  . Arthritis 12/2005   left hand  . Arthritis    Left knee  . Blood transfusion without reported diagnosis   . Bursitis of knee    right knee  . CAD (coronary artery disease)   . Diabetes mellitus   . Glaucoma    left eye  . Hyperlipidemia   . Hypertension   . Neuropathy    with pain  . Spinal stenosis 01/20/05    Current Outpatient Medications on File Prior to Visit  Medication Sig Dispense Refill  . aspirin EC 81 MG tablet Take by mouth.    [name] Kitchen atorvastatin (LIPITOR) 40 MG tablet TAKE 1 TABLET BY MOUTH AT BEDTIME 90 tablet 1  . Calcium Carbonate-Vitamin D (CALCIUM 600 + D PO) Take 1 tablet by mouth 2 (two) times daily.     . enalapril (VASOTEC) 5 MG tablet TAKE 1 TABLET BY MOUTH ONCE DAILY 90 tablet 3  . fluorometholone (FML) 0.1 % ophthalmic suspension Place 1 drop into both eyes once a week.     [name] Kitchen glucose blood (ONETOUCH VERIO) test strip Use as instructed 100 each 12  . Lancets (ONETOUCH ULTRASOFT) lancets Use one lancet each time sugars are tested, pt tests sugars twice daily. 100 each 12  . latanoprost (XALATAN) 0.005 % ophthalmic solution     . levothyroxine (SYNTHROID, LEVOTHROID) 50 MCG tablet Take 1 tablet (50 mcg total) by mouth daily. 30 tablet 3  . metFORMIN (GLUCOPHAGE) 1000 MG tablet TAKE 1 TABLET BY MOUTH TWICE DAILY 180 tablet 1  . metoprolol tartrate (LOPRESSOR) 100 MG tablet TAKE 1 TABLET BY MOUTH TWICE DAILY 180 tablet 1  . Multiple Vitamin (MULTIVITAMIN) capsule Take 1 capsule by mouth daily.      [name] Kitchen nystatin (MYCOSTATIN/NYSTOP) powder Apply topically 3 (three) times daily. 60 g 3  . Omega-3 Fatty Acids  (FISH OIL) 1200 MG CAPS Take 3 capsules by mouth daily.    . timolol (TIMOPTIC) 0.5 % ophthalmic solution Apply to eye.    . warfarin (COUMADIN) 5 MG tablet TAKE AS DIRECTED BY  THE  COUMADIN  CLINIC 90 tablet 1   No current facility-administered medications on file prior to visit.     Allergies  Allergen Reactions  . Neomycin-Bacitracin Zn-Polymyx Rash  . Penicillins Other (See Comments)    Pt states this was diagnosed years ago, unsure as to reaction. Will not use.     Family History  Problem Relation Age of Onset  . Heart failure Mother   . Heart failure Father     Social History   Socioeconomic History  . Marital status: Divorced    Spouse name: None  . Number of children: None  . Years of education: None  . Highest education level: None  Social Needs  . Financial resource strain: None  . Food insecurity - worry: None  . Food insecurity - inability: None  . Transportation needs - medical: None  . Transportation needs - non-medical: None  Occupational History  . None  Tobacco Use  . Smoking status: Never Smoker  . Smokeless tobacco: Never Used  Substance and Sexual Activity  . Alcohol use: Yes  Alcohol/week: 0.0 oz    Comment: Very seldom---socially  . Drug use: No  . Sexual activity: None  Other Topics Concern  . None  Social History Narrative   Volunteers at [employer] - See HPI.  All other ROS are negative.  BP 118/70   Pulse 79   Temp 97.8 F (36.6 C) (Oral)   Resp 16   Ht 5\' 10"  (1.778 m)   Wt 231 lb (104.8 kg)   SpO2 98%   BMI 33.15 kg/m   Physical Exam  Constitutional: He is oriented to person, place, and time and well-developed, well-nourished, and in no distress.  HENT:  Head: Normocephalic and atraumatic.  Right Ear: Tympanic membrane and external ear normal.  Left Ear: Tympanic membrane and external ear normal.  Nose: Rhinorrhea present. No mucosal edema. Right sinus exhibits no maxillary sinus  tenderness and no frontal sinus tenderness. Left sinus exhibits no maxillary sinus tenderness and no frontal sinus tenderness.  Mouth/Throat: Uvula is midline, oropharynx is clear and moist and mucous membranes are normal.  Eyes: Conjunctivae are normal.  Neck: Neck supple.  Cardiovascular: Normal heart sounds and intact distal pulses.  Chronic irregularly irregular rhythm of a fib noted on auscultation.  Pulmonary/Chest: Effort normal and breath sounds normal. No respiratory distress. He has no wheezes. He has no rales. He exhibits no tenderness.  Neurological: He is alert and oriented to person, place, and time.  Skin: Skin is warm and dry. No rash noted.  Psychiatric: Affect normal.  Vitals reviewed.   Recent Results (from the past 2160 hour(s))  POCT INR     Status: None   Collection Time: 09/21/17 11:46 AM  Result Value Ref Range   INR 2.5   POCT INR     Status: None   Collection Time: 10/20/17 11:11 AM  Result Value Ref Range   INR 2.4   Hemoglobin A1c     Status: Abnormal   Collection Time: 10/27/17  2:01 PM  Result Value Ref Range   Hgb A1c MFr Bld 6.8 (H) 4.6 - 6.5 %    Comment: Glycemic Control Guidelines for People with Diabetes:Non Diabetic:  <6%Goal of Therapy: <7%Additional Action Suggested:  >8%   Lipid panel     Status: Abnormal   Collection Time: 10/27/17  2:01 PM  Result Value Ref Range   Cholesterol 102 0 - 200 mg/dL    Comment: ATP III Classification       Desirable:  < 200 mg/dL               Borderline High:  200 - 239 mg/dL          High:  > = 240 mg/dL   Triglycerides 160.0 (H) 0.0 - 149.0 mg/dL    Comment: Normal:  <150 mg/dLBorderline High:  150 - 199 mg/dL   HDL 30.30 (L) >39.00 mg/dL   VLDL 32.0 0.0 - 40.0 mg/dL   LDL Cholesterol 40 0 - 99 mg/dL   Total CHOL/HDL Ratio 3     Comment:                Men          Women1/2 Average Risk     3.4          3.3Average Risk          5.0          4.42X Average Risk  9.6          7.13X Average Risk           15.0          11.0                       NonHDL 72.18     Comment: NOTE:  Non-HDL goal should be 30 mg/dL higher than patient's LDL goal (i.e. LDL goal of < 70 mg/dL, would have non-HDL goal of < 100 mg/dL)  Basic metabolic panel     Status: Abnormal   Collection Time: 10/27/17  2:01 PM  Result Value Ref Range   Sodium 140 135 - 145 mEq/L   Potassium 4.7 3.5 - 5.1 mEq/L   Chloride 104 96 - 112 mEq/L   CO2 27 19 - 32 mEq/L   Glucose, Bld 111 (H) 70 - 99 mg/dL   BUN 22 6 - 23 mg/dL   Creatinine, Ser 1.09 0.40 - 1.50 mg/dL   Calcium 9.0 8.4 - 10.5 mg/dL   GFR 68.85 >60.00 mL/min  TSH     Status: Abnormal   Collection Time: 10/27/17  2:01 PM  Result Value Ref Range   TSH 5.11 (H) 0.35 - 4.50 uIU/mL  Hepatic function panel     Status: None   Collection Time: 10/27/17  2:01 PM  Result Value Ref Range   Total Bilirubin 1.0 0.2 - 1.2 mg/dL   Bilirubin, Direct 0.3 0.0 - 0.3 mg/dL   Alkaline Phosphatase 46 39 - 117 U/L   AST 20 0 - 37 U/L   ALT 18 0 - 53 U/L   Total Protein 6.7 6.0 - 8.3 g/dL   Albumin 4.1 3.5 - 5.2 g/dL  CBC with Differential/Platelet     Status: Abnormal   Collection Time: 10/27/17  2:01 PM  Result Value Ref Range   WBC 6.8 4.0 - 10.5 K/uL   RBC 4.27 4.22 - 5.81 Mil/uL   Hemoglobin 13.5 13.0 - 17.0 g/dL   HCT 40.9 39.0 - 52.0 %   MCV 95.8 78.0 - 100.0 fl   MCHC 33.0 30.0 - 36.0 g/dL   RDW 13.6 11.5 - 15.5 %   Platelets 149.0 (L) 150.0 - 400.0 K/uL   Neutrophils Relative % 51.1 43.0 - 77.0 %   Lymphocytes Relative 34.2 12.0 - 46.0 %   Monocytes Relative 9.8 3.0 - 12.0 %   Eosinophils Relative 3.8 0.0 - 5.0 %   Basophils Relative 1.1 0.0 - 3.0 %   Neutro Abs 3.5 1.4 - 7.7 K/uL   Lymphs Abs 2.3 0.7 - 4.0 K/uL   Monocytes Absolute 0.7 0.1 - 1.0 K/uL   Eosinophils Absolute 0.3 0.0 - 0.7 K/uL   Basophils Absolute 0.1 0.0 - 0.1 K/uL  TSH     Status: None   Collection Time: 11/29/17  7:35 AM  Result Value Ref Range   TSH 2.30 0.35 - 4.50 uIU/mL    Assessment/Plan: 1. Post-viral cough syndrome Symptoms mostly resolved except for mild cough and PND. Start Tessalon. Plain Mucinex only. Increase fluids and rest. Saline nasal rinse for PND. Follow-up if symptoms are not continuing to resolve.     Leeanne Rio, PA-C

## 2017-11-30 ENCOUNTER — Ambulatory Visit (INDEPENDENT_AMBULATORY_CARE_PROVIDER_SITE_OTHER): Payer: Medicare HMO | Admitting: Ophthalmology

## 2017-11-30 ENCOUNTER — Encounter: Payer: Self-pay | Admitting: General Practice

## 2017-11-30 DIAGNOSIS — I10 Essential (primary) hypertension: Secondary | ICD-10-CM

## 2017-11-30 DIAGNOSIS — E11319 Type 2 diabetes mellitus with unspecified diabetic retinopathy without macular edema: Secondary | ICD-10-CM

## 2017-11-30 DIAGNOSIS — H43813 Vitreous degeneration, bilateral: Secondary | ICD-10-CM

## 2017-11-30 DIAGNOSIS — H35033 Hypertensive retinopathy, bilateral: Secondary | ICD-10-CM

## 2017-11-30 DIAGNOSIS — H353132 Nonexudative age-related macular degeneration, bilateral, intermediate dry stage: Secondary | ICD-10-CM

## 2017-11-30 DIAGNOSIS — E113293 Type 2 diabetes mellitus with mild nonproliferative diabetic retinopathy without macular edema, bilateral: Secondary | ICD-10-CM

## 2017-11-30 LAB — HM DIABETES EYE EXAM

## 2017-12-01 ENCOUNTER — Ambulatory Visit (INDEPENDENT_AMBULATORY_CARE_PROVIDER_SITE_OTHER): Payer: Medicare HMO | Admitting: *Deleted

## 2017-12-01 DIAGNOSIS — I4891 Unspecified atrial fibrillation: Secondary | ICD-10-CM

## 2017-12-01 DIAGNOSIS — Z5181 Encounter for therapeutic drug level monitoring: Secondary | ICD-10-CM | POA: Diagnosis not present

## 2017-12-01 LAB — POCT INR: INR: 2.9

## 2017-12-01 NOTE — Patient Instructions (Signed)
Description   Continue same dose of coumadin 1/2 tablet (2.5mg) daily except 1 tablet (5mg) on Tuesdays and Saturdays.  Recheck INR in 6 weeks      

## 2018-01-12 ENCOUNTER — Ambulatory Visit (INDEPENDENT_AMBULATORY_CARE_PROVIDER_SITE_OTHER): Payer: Medicare HMO | Admitting: *Deleted

## 2018-01-12 DIAGNOSIS — Z5181 Encounter for therapeutic drug level monitoring: Secondary | ICD-10-CM | POA: Diagnosis not present

## 2018-01-12 DIAGNOSIS — I4891 Unspecified atrial fibrillation: Secondary | ICD-10-CM | POA: Diagnosis not present

## 2018-01-12 LAB — POCT INR: INR: 2.3

## 2018-01-12 NOTE — Patient Instructions (Signed)
Description   Continue same dose of coumadin 1/2 tablet (2.5mg) daily except 1 tablet (5mg) on Tuesdays and Saturdays.  Recheck INR in 6 weeks      

## 2018-01-20 ENCOUNTER — Other Ambulatory Visit: Payer: Self-pay | Admitting: Family Medicine

## 2018-01-20 DIAGNOSIS — R69 Illness, unspecified: Secondary | ICD-10-CM | POA: Diagnosis not present

## 2018-01-24 DIAGNOSIS — M4696 Unspecified inflammatory spondylopathy, lumbar region: Secondary | ICD-10-CM | POA: Diagnosis not present

## 2018-01-24 DIAGNOSIS — M545 Low back pain: Secondary | ICD-10-CM | POA: Diagnosis not present

## 2018-01-24 DIAGNOSIS — M48061 Spinal stenosis, lumbar region without neurogenic claudication: Secondary | ICD-10-CM | POA: Diagnosis not present

## 2018-02-06 ENCOUNTER — Other Ambulatory Visit: Payer: Self-pay | Admitting: General Practice

## 2018-02-06 MED ORDER — LEVOTHYROXINE SODIUM 50 MCG PO TABS: 50.0000 ug | ORAL_TABLET | Freq: Every day | ORAL | 1 refills | Status: DC

## 2018-02-17 ENCOUNTER — Telehealth: Payer: Self-pay | Admitting: Family Medicine

## 2018-02-17 NOTE — Telephone Encounter (Signed)
Copied from [place] [id number]. Topic: Quick Communication - Rx Refill/Question >> Feb 17, 2018  1:12 PM Waylan Rocher, Lumin L wrote: Medication: benzonatate (TESSALON) 100 MG capsule and enalapril (VASOTEC) 5 MG tablet (has questions about why he is taking these Has the patient contacted their pharmacy? Yes. (Agent: If no, request that the patient contact the pharmacy for the refill.) Preferred Pharmacy (with phone number or street name): [facility], [address]. [place]. [place] [address] Phone: [phone] Fax: [phone] Agent: Please be advised that RX refills may take up to 3 business days. We ask that you follow-up with your pharmacy.    Patient would like a call back to discuss his medications.

## 2018-02-17 NOTE — Telephone Encounter (Signed)
Patient called and he asks for clarification on the two prescriptions he picked up from the pharmacy. He says "I picked up 2 prescriptions and both are enalapril. One of them says 100 mg take 3 times a day and I have never taken that much enalapril. The other one says enalapril 5 mg daily." I advised the benzonatate 100 mg tid is used for cough and it was ordered for him in February, 2019 when he came in with a viral cough. He says "yes, I remember. I didn't go pick up anything at that time." I advised this medication was prescribed at that time. The other medication enalapril is for BP and taken daily. I advised him to take the medication back to the pharmacy, if the seal is not broken. He says "I broke the seal on it already and they won't take it back." I advised to put the medication in the cabinet and use it as needed for coughing, if he develops coughing in the future, he verbalized understanding.

## 2018-02-21 DIAGNOSIS — Z961 Presence of intraocular lens: Secondary | ICD-10-CM | POA: Diagnosis not present

## 2018-02-21 DIAGNOSIS — H01115 Allergic dermatitis of left lower eyelid: Secondary | ICD-10-CM | POA: Diagnosis not present

## 2018-02-21 DIAGNOSIS — Z947 Corneal transplant status: Secondary | ICD-10-CM | POA: Diagnosis not present

## 2018-02-21 DIAGNOSIS — H1851 Endothelial corneal dystrophy: Secondary | ICD-10-CM | POA: Diagnosis not present

## 2018-02-21 DIAGNOSIS — H401112 Primary open-angle glaucoma, right eye, moderate stage: Secondary | ICD-10-CM | POA: Diagnosis not present

## 2018-02-21 DIAGNOSIS — H401123 Primary open-angle glaucoma, left eye, severe stage: Secondary | ICD-10-CM | POA: Diagnosis not present

## 2018-02-23 ENCOUNTER — Ambulatory Visit (INDEPENDENT_AMBULATORY_CARE_PROVIDER_SITE_OTHER): Payer: Medicare HMO | Admitting: *Deleted

## 2018-02-23 DIAGNOSIS — Z5181 Encounter for therapeutic drug level monitoring: Secondary | ICD-10-CM

## 2018-02-23 DIAGNOSIS — I4891 Unspecified atrial fibrillation: Secondary | ICD-10-CM

## 2018-02-23 LAB — POCT INR: INR: 3.8

## 2018-02-23 NOTE — Patient Instructions (Signed)
Description   Skip today's dose, then Continue same dose of coumadin 1/2 tablet (2.5mg ) daily except 1 tablet (5mg ) on Tuesdays and Saturdays.  Recheck INR in 3 weeks

## 2018-02-24 ENCOUNTER — Encounter: Payer: Self-pay | Admitting: Family Medicine

## 2018-02-24 ENCOUNTER — Ambulatory Visit (INDEPENDENT_AMBULATORY_CARE_PROVIDER_SITE_OTHER): Payer: Medicare HMO

## 2018-02-24 ENCOUNTER — Ambulatory Visit (INDEPENDENT_AMBULATORY_CARE_PROVIDER_SITE_OTHER): Payer: Medicare HMO | Admitting: Family Medicine

## 2018-02-24 ENCOUNTER — Other Ambulatory Visit: Payer: Self-pay

## 2018-02-24 VITALS — BP 132/80 | HR 62 | Temp 98.7°F | Resp 15 | Ht 69.0 in | Wt 233.8 lb

## 2018-02-24 VITALS — BP 132/80 | HR 62 | Temp 98.7°F | Resp 15 | Ht 69.0 in | Wt 233.0 lb

## 2018-02-24 DIAGNOSIS — Z125 Encounter for screening for malignant neoplasm of prostate: Secondary | ICD-10-CM | POA: Diagnosis not present

## 2018-02-24 DIAGNOSIS — E1139 Type 2 diabetes mellitus with other diabetic ophthalmic complication: Secondary | ICD-10-CM

## 2018-02-24 DIAGNOSIS — Z23 Encounter for immunization: Secondary | ICD-10-CM | POA: Diagnosis not present

## 2018-02-24 DIAGNOSIS — Z0000 Encounter for general adult medical examination without abnormal findings: Secondary | ICD-10-CM

## 2018-02-24 LAB — HEPATIC FUNCTION PANEL
ALT: 22 U/L (ref 0–53)
AST: 22 U/L (ref 0–37)
Albumin: 3.9 g/dL (ref 3.5–5.2)
Alkaline Phosphatase: 39 U/L (ref 39–117)
Bilirubin, Direct: 0.3 mg/dL (ref 0.0–0.3)
Total Bilirubin: 0.9 mg/dL (ref 0.2–1.2)
Total Protein: 6.4 g/dL (ref 6.0–8.3)

## 2018-02-24 LAB — BASIC METABOLIC PANEL
BUN: 19 mg/dL (ref 6–23)
CO2: 26 mEq/L (ref 19–32)
Calcium: 8.7 mg/dL (ref 8.4–10.5)
Chloride: 105 mEq/L (ref 96–112)
Creatinine, Ser: 0.95 mg/dL (ref 0.40–1.50)
GFR: 80.63 mL/min (ref >60.00)
Glucose, Bld: 149 mg/dL — ABNORMAL HIGH (ref 70–99)
Potassium: 4.6 mEq/L (ref 3.5–5.1)
Sodium: 140 mEq/L (ref 135–145)

## 2018-02-24 LAB — LIPID PANEL
Cholesterol: 108 mg/dL (ref 0–200)
HDL: 33.8 mg/dL — ABNORMAL LOW (ref >39.00)
LDL Cholesterol: 53 mg/dL (ref 0–99)
NonHDL: 74
Total CHOL/HDL Ratio: 3
Triglycerides: 106 mg/dL (ref 0.0–149.0)
VLDL: 21.2 mg/dL (ref 0.0–40.0)

## 2018-02-24 LAB — PSA, MEDICARE: PSA: 2.51 ng/ml (ref 0.10–4.00)

## 2018-02-24 LAB — HEMOGLOBIN A1C: Hgb A1c MFr Bld: 6.9 % — ABNORMAL HIGH (ref 4.6–6.5)

## 2018-02-24 LAB — TSH: TSH: 2.85 u[IU]/mL (ref 0.35–4.50)

## 2018-02-24 MED ORDER — ZOSTER VAC RECOMB ADJUVANTED 50 MCG/0.5ML IM SUSR: 0.5000 mL | Freq: Once | INTRAMUSCULAR | 1 refills | Status: AC

## 2018-02-24 NOTE — Assessment & Plan Note (Signed)
Pt's PE WNL w/ exception of obesity.  UTD on colonoscopy, immunizations.  Check labs.  Anticipatory guidance provided.

## 2018-02-24 NOTE — Patient Instructions (Addendum)
Shingles vaccine at pharmacy.   Bring a copy of your living will and/or healthcare power of attorney to your next office visit.  Continue doing brain stimulating activities (puzzles, reading, adult coloring books, staying active) to keep memory sharp.    Health Maintenance, Male A healthy lifestyle and preventive care is important for your health and wellness. Ask your health care provider about what schedule of regular examinations is right for you. What should I know about weight and diet? Eat a Healthy Diet  Eat plenty of vegetables, fruits, whole grains, low-fat dairy products, and lean protein.  Do not eat a lot of foods high in solid fats, added sugars, or salt.  Maintain a Healthy Weight Regular exercise can help you achieve or maintain a healthy weight. You should:  Do at least 150 minutes of exercise each week. The exercise should increase your heart rate and make you sweat (moderate-intensity exercise).  Do strength-training exercises at least twice a week.  Watch Your Levels of Cholesterol and Blood Lipids  Have your blood tested for lipids and cholesterol every 5 years starting at 82 years of age. If you are at high risk for heart disease, you should start having your blood tested when you are 82 years old. You may need to have your cholesterol levels checked more often if: ? Your lipid or cholesterol levels are high. ? You are older than 82 years of age. ? You are at high risk for heart disease.  What should I know about cancer screening? Many types of cancers can be detected early and may often be prevented. Lung Cancer  You should be screened every year for lung cancer if: ? You are a current smoker who has smoked for at least 30 years. ? You are a former smoker who has quit within the past 15 years.  Talk to your health care provider about your screening options, when you should start screening, and how often you should be screened.  Colorectal  Cancer  Routine colorectal cancer screening usually begins at 82 years of age and should be repeated every 5-10 years until you are 82 years old. You may need to be screened more often if early forms of precancerous polyps or small growths are found. Your health care provider may recommend screening at an earlier age if you have risk factors for colon cancer.  Your health care provider may recommend using home test kits to check for hidden blood in the stool.  A small camera at the end of a tube can be used to examine your colon (sigmoidoscopy or colonoscopy). This checks for the earliest forms of colorectal cancer.  Prostate and Testicular Cancer  Depending on your age and overall health, your health care provider may do certain tests to screen for prostate and testicular cancer.  Talk to your health care provider about any symptoms or concerns you have about testicular or prostate cancer.  Skin Cancer  Check your skin from head to toe regularly.  Tell your health care provider about any new moles or changes in moles, especially if: ? There is a change in a mole's size, shape, or color. ? You have a mole that is larger than a pencil eraser.  Always use sunscreen. Apply sunscreen liberally and repeat throughout the day.  Protect yourself by wearing long sleeves, pants, a wide-brimmed hat, and sunglasses when outside.  What should I know about heart disease, diabetes, and high blood pressure?  If you are 18-39 years of age,   have your blood pressure checked every 3-5 years. If you are 40 years of age or older, have your blood pressure checked every year. You should have your blood pressure measured twice-once when you are at a hospital or clinic, and once when you are not at a hospital or clinic. Record the average of the two measurements. To check your blood pressure when you are not at a hospital or clinic, you can use: ? An automated blood pressure machine at a pharmacy. ? A home blood  pressure monitor.  Talk to your health care provider about your target blood pressure.  If you are between 45-79 years old, ask your health care provider if you should take aspirin to prevent heart disease.  Have regular diabetes screenings by checking your fasting blood sugar level. ? If you are at a normal weight and have a low risk for diabetes, have this test once every three years after the age of 45. ? If you are overweight and have a high risk for diabetes, consider being tested at a younger age or more often.  A one-time screening for abdominal aortic aneurysm (AAA) by ultrasound is recommended for men aged 65-75 years who are current or former smokers. What should I know about preventing infection? Hepatitis B If you have a higher risk for hepatitis B, you should be screened for this virus. Talk with your health care provider to find out if you are at risk for hepatitis B infection. Hepatitis C Blood testing is recommended for:  Everyone born from 1945 through 1965.  Anyone with known risk factors for hepatitis C.  Sexually Transmitted Diseases (STDs)  You should be screened each year for STDs including gonorrhea and chlamydia if: ? You are sexually active and are younger than 82 years of age. ? You are older than 82 years of age and your health care provider tells you that you are at risk for this type of infection. ? Your sexual activity has changed since you were last screened and you are at an increased risk for chlamydia or gonorrhea. Ask your health care provider if you are at risk.  Talk with your health care provider about whether you are at high risk of being infected with HIV. Your health care provider may recommend a prescription medicine to help prevent HIV infection.  What else can I do?  Schedule regular health, dental, and eye exams.  Stay current with your vaccines (immunizations).  Do not use any tobacco products, such as cigarettes, chewing tobacco, and  e-cigarettes. If you need help quitting, ask your health care provider.  Limit alcohol intake to no more than 2 drinks per day. One drink equals 12 ounces of beer, 5 ounces of wine, or 1 ounces of hard liquor.  Do not use street drugs.  Do not share needles.  Ask your health care provider for help if you need support or information about quitting drugs.  Tell your health care provider if you often feel depressed.  Tell your health care provider if you have ever been abused or do not feel safe at home. This information is not intended to replace advice given to you by your health care provider. Make sure you discuss any questions you have with your health care provider. Document Released: 03/25/2008 Document Revised: 05/26/2016 Document Reviewed: 07/01/2015 Elsevier Interactive Patient Education  2018 Elsevier Inc.  

## 2018-02-24 NOTE — Patient Instructions (Signed)
Follow up in 3-4 months to recheck diabetes We'll notify you of your lab results and make any changes if needed Continue to work on healthy diet and regular exercise- you can do it! Have your podiatrist send me a copy of their report Call with any questions or concerns Have a great summer!!!

## 2018-02-24 NOTE — Assessment & Plan Note (Signed)
Chronic problem.  Hx of good control.  UTD on eye exam, foot exam.  On ACE for renal protection.  Encouraged healthy diet and regular exercise.  Check labs.  Adjust meds prn

## 2018-02-24 NOTE — Progress Notes (Addendum)
Subjective:   Marvin Foster is a 84 y.o. male who presents for Medicare Annual/Subsequent preventive examination.  Review of Systems:   Cardiac Risk Factors include: advanced age (>40men, >94 women);diabetes mellitus;dyslipidemia;hypertension;male gender;obesity (BMI >30kg/m2);family history of premature cardiovascular disease   Sleep patterns: Sleeps 5 hours, feels rested.  Home Safety/Smoke Alarms: Feels safe in home. Smoke alarms in place.  Living environment; residence and Firearm Safety: Lives alone in 1 story home. Family lives close, checks on patient daily.  Seat Belt Safety/Bike Helmet: Wears seat belt.   Male:   CCS-Colonoscopy 06/26/2015, polyps    PSA-  Lab Results  Component Value Date   PSA 2.34 01/26/2017   PSA 2.03 10/09/2015   PSA 2.01 05/09/2014       Objective:    Vitals: There were no vitals taken for this visit.  There is no height or weight on file to calculate BMI.  Advanced Directives 02/24/2018 01/26/2017 09/25/2015  Does Patient Have a Medical Advance Directive? No No No  Would patient like information on creating a medical advance directive? Yes (MAU/Ambulatory/Procedural Areas - Information given) Yes (MAU/Ambulatory/Procedural Areas - Information given) No - patient declined information    Tobacco Social History   Tobacco Use  Smoking Status Never Smoker  Smokeless Tobacco Never Used     Counseling given: Not Answered   Past Medical History:  Diagnosis Date  . Arthritis 12/2005   left hand  . Arthritis    Left knee  . Blood transfusion without reported diagnosis   . Bursitis of knee    right knee  . CAD (coronary artery disease)   . Diabetes mellitus   . Glaucoma    left eye  . Hyperlipidemia   . Hypertension   . Neuropathy    with pain  . Spinal stenosis 01/20/05   Past Surgical History:  Procedure Laterality Date  . BACK SURGERY    . CATARACT EXTRACTION  08/11/09   right  . corneal endothelial Left 2013  . CORNEAL  TRANSPLANT Left    eye  . CORONARY ANGIOPLASTY WITH STENT PLACEMENT    . EYE SURGERY    . SMALL INTESTINE SURGERY    . SPINE SURGERY    . VASECTOMY     Family History  Problem Relation Age of Onset  . Heart failure Mother   . Heart failure Father    Social History   Socioeconomic History  . Marital status: Divorced    Spouse name: Not on file  . Number of children: Not on file  . Years of education: Not on file  . Highest education level: Not on file  Occupational History  . Not on file  Social Needs  . Financial resource strain: Not on file  . Food insecurity:    Worry: Not on file    Inability: Not on file  . Transportation needs:    Medical: Not on file    Non-medical: Not on file  Tobacco Use  . Smoking status: Never Smoker  . Smokeless tobacco: Never Used  Substance and Sexual Activity  . Alcohol use: Yes    Alcohol/week: 0.0 oz    Comment: Very seldom---socially  . Drug use: No  . Sexual activity: Not on file  Lifestyle  . Physical activity:    Days per week: Not on file    Minutes per session: Not on file  . Stress: Not on file  Relationships  . Social connections:    Talks on phone: Not  on file    Gets together: Not on file    Attends religious service: Not on file    Active member of club or organization: Not on file    Attends meetings of clubs or organizations: Not on file    Relationship status: Not on file  Other Topics Concern  . Not on file  Social History Narrative   Volunteers at [facility] Encounter Medications as of 02/24/2018  Medication Sig  . aspirin EC 81 MG tablet Take by mouth.  [name] Kitchen atorvastatin (LIPITOR) 40 MG tablet TAKE 1 TABLET BY MOUTH AT BEDTIME  . benzonatate (TESSALON) 100 MG capsule Take 1 capsule (100 mg total) by mouth 3 (three) times daily as needed for cough. (Patient not taking: Reported on 02/24/2018)  . Calcium Carbonate-Vitamin D (CALCIUM 600 + D PO) Take 1 tablet by mouth 2 (two) times  daily.   . enalapril (VASOTEC) 5 MG tablet TAKE 1 TABLET BY MOUTH ONCE DAILY  . fluorometholone (FML) 0.1 % ophthalmic suspension Place 1 drop into both eyes once a week.   . Lancets (ONETOUCH ULTRASOFT) lancets Use one lancet each time sugars are tested, pt tests sugars twice daily.  [name] Kitchen latanoprost (XALATAN) 0.005 % ophthalmic solution   . levothyroxine (SYNTHROID, LEVOTHROID) 50 MCG tablet Take 1 tablet (50 mcg total) by mouth daily.  . metFORMIN (GLUCOPHAGE) 1000 MG tablet TAKE 1 TABLET BY MOUTH TWICE DAILY  . metoprolol tartrate (LOPRESSOR) 100 MG tablet TAKE 1 TABLET BY MOUTH TWICE DAILY  . Multiple Vitamin (MULTIVITAMIN) capsule Take 1 capsule by mouth daily.    [name] Kitchen nystatin (MYCOSTATIN/NYSTOP) powder Apply topically 3 (three) times daily. (Patient not taking: Reported on 02/24/2018)  . Omega-3 Fatty Acids (FISH OIL) 1200 MG CAPS Take 3 capsules by mouth daily.  [name] VERIO test strip USE AS DIRECTED TWICE DAILY  . timolol (TIMOPTIC) 0.5 % ophthalmic solution Apply to eye.  . warfarin (COUMADIN) 5 MG tablet TAKE AS DIRECTED BY  THE  COUMADIN  CLINIC  . Zoster Vaccine Adjuvanted [facility]) injection Inject 0.5 mLs into the muscle once for 1 dose.   No facility-administered encounter medications on file as of 02/24/2018.     Activities of Daily Living In your present state of health, do you have any difficulty performing the following activities: 02/24/2018 10/27/2017  Hearing? N N  Vision? N N  Difficulty concentrating or making decisions? N N  Walking or climbing stairs? N N  Dressing or bathing? N N  Doing errands, shopping? N N  Preparing Food and eating ? N -  Using the Toilet? N -  In the past six months, have you accidently leaked urine? N -  Do you have problems with loss of bowel control? N -  Managing your Medications? N -  Managing your Finances? N -  Housekeeping or managing your Housekeeping? N -  Some recent data might be hidden    Patient Care Team: [name], MD as PCP - General Jerline Pain, MD as Consulting Physician (Cardiology) [place], [place] (Specialist) Gardiner Barefoot, DPM as Consulting Physician (Podiatry)   Assessment:   This is a routine wellness examination for [place].  Exercise Activities and Dietary recommendations Current Exercise Habits: The patient does not participate in regular exercise at present(Walk during work hours. ), Exercise limited by: None identified   Diet (meal preparation, eat out, water intake, caffeinated beverages, dairy  products, fruits and vegetables): Drinks water and coffee.   Breakfast: eggs, oatmeal, cereal w/fruit, coffee Lunch: sandwich Dinner: salad, pasta, ground beef    Goals    . Weight (lb) < 220 lb (99.8 kg)     Lose weight by watching portion control       Fall Risk Fall Risk  10/27/2017 01/26/2017 10/18/2016 10/09/2015 09/25/2015  Falls in the past year? No Yes Yes Yes Yes  Comment - Fell at steps - - -  Number falls in past yr: - 1 1 2  or more 2 or more  Injury with Fall? - No Yes No No  Risk Factor Category  - - - High Fall Risk High Fall Risk  Risk for fall due to : - - - Impaired mobility;Impaired balance/gait Impaired mobility  Risk for fall due to: Comment - - - - Decreased strenght in legs, bilateral knee pain  Follow up - - - Education provided;Falls prevention discussed Education provided;Falls prevention discussed    Depression Screen PHQ 2/9 Scores 02/24/2018 10/27/2017 01/26/2017 10/18/2016  PHQ - 2 Score 0 0 0 0  PHQ- 9 Score - 0 - -    Cognitive Function MMSE - Mini Mental State Exam 02/24/2018 09/25/2015  Orientation to time 5 5  Orientation to Place 5 5  Registration 3 3  Attention/ Calculation 5 5  Recall 3 2  Language- name 2 objects 2 2  Language- repeat 1 1  Language- follow 3 step command 3 3  Language- read & follow direction 1 1  Write a sentence 1 1  Copy design 1 1  Total score 30 29         Immunization History  Administered Date(s) Administered  . Influenza Split 08/06/2015  . Influenza Whole 08/14/2007, 07/23/2009, 07/10/2010, 08/11/2016  . Influenza, High Dose Seasonal PF 07/30/2016  . Influenza,inj,Quad PF,6+ Mos 08/14/2013, 08/13/2014  . Influenza-Unspecified 08/06/2015, 08/04/2017  . Pneumococcal Conjugate-13 01/16/2015  . Pneumococcal Polysaccharide-23 12/22/2009, 01/26/2017  . Tdap 08/28/2014  . Zoster 08/20/2010     Screening Tests Health Maintenance  Topic Date Due  . HEMOGLOBIN A1C  04/26/2018  . INFLUENZA VACCINE  05/11/2018  . FOOT EXAM  05/19/2018  . COLONOSCOPY  06/25/2018  . OPHTHALMOLOGY EXAM  11/30/2018  . TETANUS/TDAP  08/28/2024  . PNA vac Low Risk Adult  Completed        Plan:     Shingles vaccine at pharmacy.   Bring a copy of your living will and/or healthcare power of attorney to your next office visit.  Continue doing brain stimulating activities (puzzles, reading, adult coloring books, staying active) to keep memory sharp.    I have personally reviewed and noted the following in the patient's chart:   . Medical and social history . Use of alcohol, tobacco or illicit drugs  . Current medications and supplements . Functional ability and status . Nutritional status . Physical activity . Advanced directives . List of other physicians . Hospitalizations, surgeries, and ER visits in previous 12 months . Vitals . Screenings to include cognitive, depression, and falls . Referrals and appointments  In addition, I have reviewed and discussed with patient certain preventive protocols, quality metrics, and best practice recommendations. A written personalized care plan for preventive services as well as general preventive health recommendations were provided to patient.     Gerilyn Nestle, RN  02/24/2018  Reviewed documentation provided by RN and agree w/ above.  Annye Asa, MD

## 2018-02-24 NOTE — Progress Notes (Signed)
   Subjective:    Patient ID: Marvin Foster, male    DOB: 08-13-1934, 84 y.o.   MRN: [id number]  HPI CPE- UTD on colonoscopy, eye exam, foot exam, immunizations   Review of Systems Patient reports no vision/hearing changes, anorexia, fever ,adenopathy, persistant/recurrent hoarseness, swallowing issues, chest pain, palpitations, edema, persistant/recurrent cough, hemoptysis, dyspnea (rest,exertional, paroxysmal nocturnal), gastrointestinal  bleeding (melena, rectal bleeding), abdominal pain, excessive heart burn, GU symptoms (dysuria, hematuria, voiding/incontinence issues) syncope, focal weakness, memory loss, numbness & tingling, skin/hair/nail changes, depression, anxiety, abnormal bruising/bleeding, musculoskeletal symptoms/signs.     Objective:   Physical Exam General Appearance:    Alert, cooperative, no distress, appears stated age  Head:    Normocephalic, without obvious abnormality, atraumatic  Eyes:    PERRL, conjunctiva/corneas clear, EOM's intact, fundi    benign, both eyes       Ears:    Hearing aides in place bilaterally  Nose:   Nares normal, septum midline, mucosa normal, no drainage   or sinus tenderness  Throat:   Lips, mucosa, and tongue normal; teeth and gums normal  Neck:   Supple, symmetrical, trachea midline, no adenopathy;       thyroid:  No enlargement/tenderness/nodules  Back:     Symmetric, no curvature, ROM normal, no CVA tenderness  Lungs:     Clear to auscultation bilaterally, respirations unlabored  Chest wall:    No tenderness or deformity  Heart:    Irregular S1 and S2 normal, no murmur, rub   or gallop  Abdomen:     Soft, non-tender, bowel sounds active all four quadrants,    no masses, no organomegaly  Genitalia:    Deferred at pt request  Rectal:    Extremities:   Extremities normal, atraumatic, no cyanosis or edema  Pulses:   2+ and symmetric all extremities  Skin:   Skin color, texture, turgor normal, no rashes or lesions  Lymph nodes:    Cervical, supraclavicular, and axillary nodes normal  Neurologic:   CNII-XII intact. Normal strength, sensation and reflexes      throughout          Assessment & Plan:

## 2018-02-27 ENCOUNTER — Encounter: Payer: Self-pay | Admitting: General Practice

## 2018-03-06 ENCOUNTER — Other Ambulatory Visit: Payer: Self-pay | Admitting: Family Medicine

## 2018-03-13 DIAGNOSIS — R69 Illness, unspecified: Secondary | ICD-10-CM | POA: Diagnosis not present

## 2018-03-16 ENCOUNTER — Ambulatory Visit: Payer: Medicare HMO | Admitting: *Deleted

## 2018-03-16 DIAGNOSIS — Z5181 Encounter for therapeutic drug level monitoring: Secondary | ICD-10-CM | POA: Diagnosis not present

## 2018-03-16 DIAGNOSIS — I4891 Unspecified atrial fibrillation: Secondary | ICD-10-CM

## 2018-03-16 LAB — POCT INR: INR: 2.8 (ref 2.0–3.0)

## 2018-03-16 NOTE — Patient Instructions (Signed)
Description   Continue same dose of coumadin 1/2 tablet (2.5mg ) daily except 1 tablet (5mg ) on Tuesdays and Saturdays.  Recheck INR in 4 weeks

## 2018-03-22 LAB — HM DIABETES EYE EXAM

## 2018-03-28 ENCOUNTER — Other Ambulatory Visit: Payer: Self-pay | Admitting: Cardiology

## 2018-03-29 ENCOUNTER — Ambulatory Visit: Payer: Medicare HMO | Admitting: Podiatry

## 2018-03-29 DIAGNOSIS — R69 Illness, unspecified: Secondary | ICD-10-CM | POA: Diagnosis not present

## 2018-04-12 ENCOUNTER — Ambulatory Visit: Payer: Medicare HMO | Admitting: Pharmacist

## 2018-04-12 DIAGNOSIS — Z5181 Encounter for therapeutic drug level monitoring: Secondary | ICD-10-CM | POA: Diagnosis not present

## 2018-04-12 DIAGNOSIS — I4891 Unspecified atrial fibrillation: Secondary | ICD-10-CM

## 2018-04-12 LAB — POCT INR: INR: 2 (ref 2.0–3.0)

## 2018-04-12 NOTE — Patient Instructions (Signed)
Description   Continue same dose of coumadin 1/2 tablet (2.5mg ) daily except 1 tablet (5mg ) on Tuesdays and Saturdays.  Recheck INR in 5 weeks

## 2018-04-13 DIAGNOSIS — R69 Illness, unspecified: Secondary | ICD-10-CM | POA: Diagnosis not present

## 2018-04-14 ENCOUNTER — Telehealth: Payer: Self-pay | Admitting: *Deleted

## 2018-04-14 ENCOUNTER — Telehealth: Payer: Self-pay | Admitting: Family Medicine

## 2018-04-14 DIAGNOSIS — Z1283 Encounter for screening for malignant neoplasm of skin: Secondary | ICD-10-CM

## 2018-04-14 NOTE — Addendum Note (Signed)
Addended by: Katina Dung on: 04/14/2018 10:26 AM   Modules accepted: Orders

## 2018-04-14 NOTE — Telephone Encounter (Signed)
Referral has been placed. 

## 2018-04-14 NOTE — Telephone Encounter (Signed)
Ok for referral?

## 2018-04-14 NOTE — Telephone Encounter (Signed)
Okay to send new referral?    I will need to research to find out who pt. Saw in 2017  Copied from [place] [id number]. Topic: Referral - Request >> Apr 14, 2018  8:21 AM Judyann Munson wrote: Reason for CRM:  Patient is requesting a referral for a Dermatology that will accept his insurance,  he stated he liked the Dermatology that he seen in 2017. please advice

## 2018-04-14 NOTE — Telephone Encounter (Signed)
Copied from [place] [phone]. Topic: Quick Communication - See Telephone Encounter >> Apr 14, 2018  4:36 PM Rutherford Nail, NT wrote: CRM for notification. See Telephone encounter for: 04/14/18. Monica with [facility] calling to see why the patient is needing an appointment? States that on the referral it states skin cancer screening. Patient let her know it was for a rash in his groin. Would like office notses sent to the office for this issue. Patient has an appointment on Monday 04/17/18 at 4:10pm. Please advise.   Fax#: [phone] [phone]

## 2018-04-17 DIAGNOSIS — L304 Erythema intertrigo: Secondary | ICD-10-CM | POA: Diagnosis not present

## 2018-04-17 DIAGNOSIS — L218 Other seborrheic dermatitis: Secondary | ICD-10-CM | POA: Diagnosis not present

## 2018-04-17 NOTE — Telephone Encounter (Signed)
Marvin Foster, from [facility] is returning a call from [place] regarding clarification on a referral for patient.  CB# # [phone].

## 2018-04-17 NOTE — Telephone Encounter (Signed)
LM for [facility] from Dermatology to call me back.  We do not have record/office notes on a rash.  We were just updating referral for patient.

## 2018-04-17 NOTE — Telephone Encounter (Signed)
Spoke with Levada Dy and she is going to send previous notes over to Dermatology.   They will let us know if there is anything more that they need.

## 2018-05-01 DIAGNOSIS — M19011 Primary osteoarthritis, right shoulder: Secondary | ICD-10-CM | POA: Diagnosis not present

## 2018-05-01 DIAGNOSIS — M25511 Pain in right shoulder: Secondary | ICD-10-CM | POA: Diagnosis not present

## 2018-05-01 DIAGNOSIS — M7551 Bursitis of right shoulder: Secondary | ICD-10-CM | POA: Diagnosis not present

## 2018-05-08 DIAGNOSIS — M25511 Pain in right shoulder: Secondary | ICD-10-CM | POA: Diagnosis not present

## 2018-05-17 ENCOUNTER — Encounter: Payer: Self-pay | Admitting: Internal Medicine

## 2018-05-17 ENCOUNTER — Ambulatory Visit: Payer: Medicare HMO | Admitting: *Deleted

## 2018-05-17 DIAGNOSIS — I4891 Unspecified atrial fibrillation: Secondary | ICD-10-CM

## 2018-05-17 DIAGNOSIS — Z5181 Encounter for therapeutic drug level monitoring: Secondary | ICD-10-CM

## 2018-05-17 LAB — POCT INR: INR: 2.6 (ref 2.0–3.0)

## 2018-05-17 MED ORDER — WARFARIN SODIUM 2.5 MG PO TABS: 2.5000 mg | ORAL_TABLET | ORAL | 1 refills | Status: DC

## 2018-05-17 NOTE — Patient Instructions (Signed)
Description   Continue same dose of coumadin 1/2 tablet (2.5mg ) daily except 1 tablet (5mg ) on Tuesdays and Saturdays.  Recheck INR in 6 weeks

## 2018-05-31 ENCOUNTER — Encounter: Payer: Self-pay | Admitting: Podiatry

## 2018-05-31 ENCOUNTER — Ambulatory Visit: Payer: Medicare HMO | Admitting: Podiatry

## 2018-05-31 DIAGNOSIS — E119 Type 2 diabetes mellitus without complications: Secondary | ICD-10-CM | POA: Diagnosis not present

## 2018-05-31 LAB — HM DIABETES FOOT EXAM

## 2018-05-31 NOTE — Progress Notes (Signed)
This patient presents to the office for his annual foot exam.  Patient has no pain or foot pathology.   Patient is diabetic  And takes metformin.  He is also taking  Coumadin.  General Appearance  Alert, conversant and in no acute stress.  Vascular  Dorsalis pedis and posterior tibial  pulses are palpable  bilaterally.  Capillary return is within normal limits  bilaterally. Temperature is within normal limits  bilaterally.  Neurologic  Senn-Weinstein monofilament wire test within normal limits  Right foot.  LOPS is diminished left foot.. Muscle power within normal limits bilaterally.  Nails normal nails with no evidence of fungal or bacterial infection.  Orthopedic  No limitations of motion of motion feet .  No crepitus or effusions noted.  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  Second digit overlapping right hallux.  Skin  normotropic skin with no porokeratosis noted bilaterally.  No signs of infections or ulcers noted.    Diabetic with neuropathy  ROV>  Vascular  WNL.  Neuropathy left foot.    RTC 1 year for annual foot exam.   Gardiner Barefoot DPM

## 2018-06-01 DIAGNOSIS — R69 Illness, unspecified: Secondary | ICD-10-CM | POA: Diagnosis not present

## 2018-06-22 ENCOUNTER — Encounter: Payer: Self-pay | Admitting: General Practice

## 2018-06-22 ENCOUNTER — Encounter: Payer: Self-pay | Admitting: Family Medicine

## 2018-06-22 ENCOUNTER — Other Ambulatory Visit: Payer: Self-pay

## 2018-06-22 ENCOUNTER — Ambulatory Visit (INDEPENDENT_AMBULATORY_CARE_PROVIDER_SITE_OTHER): Payer: Medicare HMO | Admitting: Family Medicine

## 2018-06-22 VITALS — BP 130/80 | HR 81 | Temp 98.1°F | Resp 16 | Ht 69.0 in | Wt 236.1 lb

## 2018-06-22 DIAGNOSIS — E1139 Type 2 diabetes mellitus with other diabetic ophthalmic complication: Secondary | ICD-10-CM | POA: Diagnosis not present

## 2018-06-22 LAB — BASIC METABOLIC PANEL
BUN: 18 mg/dL (ref 6–23)
CO2: 28 mEq/L (ref 19–32)
Calcium: 8.9 mg/dL (ref 8.4–10.5)
Chloride: 103 mEq/L (ref 96–112)
Creatinine, Ser: 1 mg/dL (ref 0.40–1.50)
GFR: 75.93 mL/min (ref >60.00)
Glucose, Bld: 175 mg/dL — ABNORMAL HIGH (ref 70–99)
Potassium: 4.8 mEq/L (ref 3.5–5.1)
Sodium: 140 mEq/L (ref 135–145)

## 2018-06-22 LAB — HEMOGLOBIN A1C: Hgb A1c MFr Bld: 6.9 % — ABNORMAL HIGH (ref 4.6–6.5)

## 2018-06-22 NOTE — Progress Notes (Signed)
   Subjective:    Patient ID: Marvin Foster, male    DOB: 1933/10/28, 84 y.o.   MRN: [id number]  HPI DM- chronic problem, on Metformin 1000mg  BID.  UTD on eye exam.  Had foot exam 2 weeks ago at [place].  On ACE for renal protection.  Denies symptomatic lows.  No dizziness.  No CP, SOB, HAs, abd pain, N/V.   Review of Systems For ROS see HPI     Objective:   Physical Exam  Constitutional: He is oriented to person, place, and time. He appears well-developed and well-nourished. No distress.  HENT:  Head: Normocephalic and atraumatic.  Eyes: Pupils are equal, round, and reactive to light. Conjunctivae and EOM are normal.  Neck: Normal range of motion. Neck supple. No thyromegaly present.  Cardiovascular: Normal rate, normal heart sounds and intact distal pulses.  No murmur heard. Irregularly irregular S1/S2  Pulmonary/Chest: Effort normal and breath sounds normal. No respiratory distress.  Abdominal: Soft. Bowel sounds are normal. He exhibits no distension.  Musculoskeletal: He exhibits no edema.  Lymphadenopathy:    He has no cervical adenopathy.  Neurological: He is alert and oriented to person, place, and time. No cranial nerve deficit.  Skin: Skin is warm and dry.  Psychiatric: He has a normal mood and affect. His behavior is normal.  Vitals reviewed.         Assessment & Plan:

## 2018-06-22 NOTE — Patient Instructions (Signed)
Follow up in 3-4 months to recheck diabetes and cholesterol We'll notify you of your lab results and make any changes if needed Continue to work on healthy diet and regular exercise- you're doing great! Call with any questions or concerns Happy Fall!!!

## 2018-06-22 NOTE — Assessment & Plan Note (Signed)
Chronic problem.  Tolerating Metformin w/o difficulty.  UTD on foot exam, eye exam, and on ACE for renal protection.  Check labs.  Adjust meds prn

## 2018-06-23 ENCOUNTER — Encounter: Payer: Self-pay | Admitting: General Practice

## 2018-06-26 ENCOUNTER — Ambulatory Visit (INDEPENDENT_AMBULATORY_CARE_PROVIDER_SITE_OTHER): Payer: Medicare HMO

## 2018-06-26 ENCOUNTER — Encounter: Payer: Self-pay | Admitting: Physician Assistant

## 2018-06-26 ENCOUNTER — Other Ambulatory Visit: Payer: Self-pay

## 2018-06-26 ENCOUNTER — Ambulatory Visit (INDEPENDENT_AMBULATORY_CARE_PROVIDER_SITE_OTHER): Payer: Medicare HMO | Admitting: Physician Assistant

## 2018-06-26 ENCOUNTER — Telehealth: Payer: Self-pay

## 2018-06-26 VITALS — BP 122/70 | HR 72 | Temp 98.2°F | Resp 14 | Ht 69.0 in | Wt 235.0 lb

## 2018-06-26 DIAGNOSIS — R0781 Pleurodynia: Secondary | ICD-10-CM | POA: Diagnosis not present

## 2018-06-26 DIAGNOSIS — W010XXA Fall on same level from slipping, tripping and stumbling without subsequent striking against object, initial encounter: Secondary | ICD-10-CM | POA: Diagnosis not present

## 2018-06-26 DIAGNOSIS — S299XXA Unspecified injury of thorax, initial encounter: Secondary | ICD-10-CM | POA: Diagnosis not present

## 2018-06-26 NOTE — Patient Instructions (Signed)
Please go to the [facility] office for x-ray. The address is [place], [place]. I will call you with your results.  Tylenol if needed for pain.  Limit heavy lifting and pushing/pulling.  Giving recurrent falls I am having you set up for a gait assessment with PT. This is very important. If you do not hear from them within a week, please let me know.

## 2018-06-26 NOTE — Progress Notes (Signed)
Patient presents to clinic today c/o fall into a door at [employer] last week while he was volunteering. Denies any dizziness, racing heart or other prodromal symptoms prior to this fall. Was walking through a doorway when he stumbled falling into a door and onto the floor. Denies head injury. Was able to get himself up. Noted initial soreness in left lateral chest. States since then this has improved but has intermittent tenderness when touching the area, pushing or pulling. Is concerned of a fracture.   Past Medical History:  Diagnosis Date  . Arthritis 12/2005   left hand  . Arthritis    Left knee  . Blood transfusion without reported diagnosis   . Bursitis of knee    right knee  . CAD (coronary artery disease)   . Diabetes mellitus   . Glaucoma    left eye  . Hyperlipidemia   . Hypertension   . Neuropathy    with pain  . Spinal stenosis 01/20/05    Current Outpatient Medications on File Prior to Visit  Medication Sig Dispense Refill  . aspirin EC 81 MG tablet Take by mouth.    [name] Kitchen atorvastatin (LIPITOR) 40 MG tablet TAKE 1 TABLET BY MOUTH AT BEDTIME 90 tablet 1  . Calcium Carbonate-Vitamin D (CALCIUM 600 + D PO) Take 1 tablet by mouth 2 (two) times daily.     . enalapril (VASOTEC) 5 MG tablet TAKE 1 TABLET BY MOUTH ONCE DAILY 90 tablet 3  . fluorometholone (FML) 0.1 % ophthalmic suspension Place 1 drop into both eyes once a week.     . gabapentin (NEURONTIN) 300 MG capsule     . Lancets (ONETOUCH ULTRASOFT) lancets Use one lancet each time sugars are tested, pt tests sugars twice daily. 100 each 12  . latanoprost (XALATAN) 0.005 % ophthalmic solution     . levothyroxine (SYNTHROID, LEVOTHROID) 50 MCG tablet Take 1 tablet (50 mcg total) by mouth daily. 90 tablet 1  . metFORMIN (GLUCOPHAGE) 1000 MG tablet TAKE 1 TABLET BY MOUTH TWICE DAILY 180 tablet 1  . metoprolol tartrate (LOPRESSOR) 100 MG tablet TAKE 1 TABLET BY MOUTH TWICE DAILY 180 tablet 1  . Multiple Vitamin  (MULTIVITAMIN) capsule Take 1 capsule by mouth daily.      [name] Kitchen nystatin (MYCOSTATIN/NYSTOP) powder Apply topically 3 (three) times daily. 60 g 3  . Omega-3 Fatty Acids (FISH OIL) 1200 MG CAPS Take 3 capsules by mouth daily.    [name] VERIO test strip USE AS DIRECTED TWICE DAILY 100 each 12  . timolol (TIMOPTIC) 0.5 % ophthalmic solution Apply to eye.    . warfarin (COUMADIN) 2.5 MG tablet Take 1 tablet (2.5 mg total) by mouth as directed. 120 tablet 1   No current facility-administered medications on file prior to visit.     Allergies  Allergen Reactions  . Neomycin-Bacitracin Zn-Polymyx Rash  . Penicillins Other (See Comments)    Pt states this was diagnosed years ago, unsure as to reaction. Will not use.     Family History  Problem Relation Age of Onset  . Heart failure Mother   . Heart failure Father     Social History   Socioeconomic History  . Marital status: Divorced    Spouse name: Not on file  . Number of children: Not on file  . Years of education: Not on file  . Highest education level: Not on file  Occupational History  . Not on file  Social Needs  . Emergency planning/management officer  strain: Not on file  . Food insecurity:    Worry: Not on file    Inability: Not on file  . Transportation needs:    Medical: Not on file    Non-medical: Not on file  Tobacco Use  . Smoking status: Never Smoker  . Smokeless tobacco: Never Used  Substance and Sexual Activity  . Alcohol use: Yes    Alcohol/week: 0.0 standard drinks    Comment: Very seldom---socially  . Drug use: No  . Sexual activity: Not on file  Lifestyle  . Physical activity:    Days per week: Not on file    Minutes per session: Not on file  . Stress: Not on file  Relationships  . Social connections:    Talks on phone: Not on file    Gets together: Not on file    Attends religious service: Not on file    Active member of club or organization: Not on file    Attends meetings of clubs or organizations: Not on file     Relationship status: Not on file  Other Topics Concern  . Not on file  Social History Narrative   Volunteers at [employer] - See HPI.  All other ROS are negative.  BP 122/70   Pulse 72   Temp 98.2 F (36.8 C) (Oral)   Resp 14   Ht 5\' 9"  (1.753 m)   Wt 235 lb (106.6 kg)   SpO2 96%   BMI 34.70 kg/m   Physical Exam  Constitutional: He is oriented to person, place, and time. He appears well-developed and well-nourished.  HENT:  Head: Normocephalic and atraumatic.  Right Ear: External ear normal.  Left Ear: External ear normal.  Nose: Nose normal.  Mouth/Throat: Oropharynx is clear and moist.  Eyes: Pupils are equal, round, and reactive to light. Conjunctivae are normal.  Cardiovascular: Normal rate, regular rhythm, normal heart sounds and intact distal pulses.  Pulmonary/Chest: Effort normal and breath sounds normal. No stridor. No respiratory distress. He has no wheezes. He has no rales. He exhibits tenderness. He exhibits no mass and no crepitus.    Abdominal: Soft. Bowel sounds are normal. He exhibits no distension. There is no tenderness.  Neurological: He is alert and oriented to person, place, and time. Coordination normal.  Psychiatric: He has a normal mood and affect.  Vitals reviewed.   Recent Results (from the past 2160 hour(s))  POCT INR     Status: None   Collection Time: 04/12/18 11:26 AM  Result Value Ref Range   INR 2.0 2.0 - 3.0  POCT INR     Status: None   Collection Time: 05/17/18 11:13 AM  Result Value Ref Range   INR 2.6 2.0 - 3.0  HM DIABETES FOOT EXAM     Status: None   Collection Time: 05/31/18 12:00 AM  Result Value Ref Range   HM Diabetic Foot Exam completed   Hemoglobin A1c     Status: Abnormal   Collection Time: 06/22/18  2:46 PM  Result Value Ref Range   Hgb A1c MFr Bld 6.9 (H) 4.6 - 6.5 %    Comment: Glycemic Control Guidelines for People with Diabetes:Non Diabetic:  <6%Goal of Therapy: <7%Additional  Action Suggested:  >6%   Basic metabolic panel     Status: Abnormal   Collection Time: 06/22/18  2:46 PM  Result Value Ref Range   Sodium 140 135 - 145 mEq/L  Potassium 4.8 3.5 - 5.1 mEq/L   Chloride 103 96 - 112 mEq/L   CO2 28 19 - 32 mEq/L   Glucose, Bld 175 (H) 70 - 99 mg/dL   BUN 18 6 - 23 mg/dL   Creatinine, Ser 1.00 0.40 - 1.50 mg/dL   Calcium 8.9 8.4 - 10.5 mg/dL   GFR 75.93 >60.00 mL/min   Assessment/Plan: 1. Fall due to stumbling, initial encounter Will obtain x-ray today to r/o fracture. Likely contusion. Supportive measures and OTC medications reviewed. He has chronic widened gait which is likely contributing to episodes of stumbling. Will refer to PT for gait assessment and management.  - DG Ribs Unilateral Left; Future - Ambulatory referral to Physical Therapy  2. Rib pain on left side X-ray today to r/o smaller fracture giving injury and current symptoms. For now supportive measures and OTC medications reviewed. Will alter regimen based on imaging results.  - DG Ribs Unilateral Left; Future   Leeanne Rio, PA-C

## 2018-06-26 NOTE — Telephone Encounter (Signed)
SW patient regarding fall (scheduled for "fall, requesting xrays"). Patient reports falling on Tuesday, 06/20/18, did not hit his head. He reports left side is tender to touch, denies SOB, bruising or change in ROM. Patient states tenderness does not seem to be improving, would like imaging. Patient scheduled today with Elyn Aquas, PA-C.

## 2018-06-28 ENCOUNTER — Ambulatory Visit: Payer: Medicare HMO | Admitting: Pharmacist

## 2018-06-28 DIAGNOSIS — I4891 Unspecified atrial fibrillation: Secondary | ICD-10-CM | POA: Diagnosis not present

## 2018-06-28 DIAGNOSIS — Z5181 Encounter for therapeutic drug level monitoring: Secondary | ICD-10-CM | POA: Diagnosis not present

## 2018-06-28 LAB — POCT INR: INR: 2.4 (ref 2.0–3.0)

## 2018-06-28 NOTE — Patient Instructions (Signed)
Description   Continue same dose of coumadin 1/2 tablet (2.5mg ) daily except 1 tablet (5mg ) on Tuesdays and Saturdays.  Recheck INR in 6 weeks

## 2018-07-13 ENCOUNTER — Ambulatory Visit (INDEPENDENT_AMBULATORY_CARE_PROVIDER_SITE_OTHER): Payer: Medicare HMO | Admitting: Physical Therapy

## 2018-07-13 ENCOUNTER — Encounter: Payer: Self-pay | Admitting: Physical Therapy

## 2018-07-13 DIAGNOSIS — R2689 Other abnormalities of gait and mobility: Secondary | ICD-10-CM | POA: Diagnosis not present

## 2018-07-13 NOTE — Therapy (Signed)
[facility] [address] [place], [place], [address] Phone: [phone]   Fax:  [phone]  Physical Therapy Evaluation  Patient Details  Name: Marvin Foster MRN: [id number] Date of Birth: 01/21/1934 Referring Provider (PT): Raiford Noble, [place]   Encounter Date: 07/13/2018  PT End of Session - 07/13/18 1208    Visit Number  1    Number of Visits  12    Date for PT Re-Evaluation  08/24/18    Authorization Type  [employer] Medicare    PT Start Time  [phone]    PT Stop Time  1013    PT Time Calculation (min)  39 min    Equipment Utilized During Treatment  Gait belt    Activity Tolerance  Patient tolerated treatment well    Behavior During Therapy  [facility] for tasks assessed/performed       Past Medical History:  Diagnosis Date  . Arthritis 12/2005   left hand  . Arthritis    Left knee  . Blood transfusion without reported diagnosis   . Bursitis of knee    right knee  . CAD (coronary artery disease)   . Diabetes mellitus   . Glaucoma    left eye  . Hyperlipidemia   . Hypertension   . Neuropathy    with pain  . Spinal stenosis 01/20/05    Past Surgical History:  Procedure Laterality Date  . BACK SURGERY    . CATARACT EXTRACTION  08/11/09   right  . corneal endothelial Left 2013  . CORNEAL TRANSPLANT Left    eye  . CORONARY ANGIOPLASTY WITH STENT PLACEMENT    . EYE SURGERY    . SMALL INTESTINE SURGERY    . SPINE SURGERY    . VASECTOMY      There were no vitals filed for this visit.   Subjective Assessment - 07/13/18 0936    Subjective  Pt states increased falls, 2-3 in the last few months.   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 He denies any dizziness, light headedness, heart palpitations, etc during time of falls, but states "something just comes over him, and he is turning in circles and cant control it".  Pt lives alone but daughter check on him, he  is independent with all activities, and continues to drive, uses no AD.     Currently in Pain?  No/denies         [facility] PT Assessment - 07/13/18 0001      Assessment   Medical Diagnosis  Balance/Falls    Referring Provider (PT)  Raiford Noble, PA    Prior Therapy  Not for balance      Precautions   Precautions  Fall      Balance Screen   Has the patient fallen in the past 6 months  Yes    How many times?  3    Has the patient had a decrease in activity level because of a fear of falling?   No    Is the patient reluctant to leave their home because of a fear of falling?   No      Prior Function   Level of Independence  Independent      Cognition   Overall Cognitive Status  Within Functional Limits for tasks assessed      ROM / Strength   AROM / PROM / Strength  AROM;Strength  AROM   Overall AROM Comments  UE: WFL ,   LE: WFL      Strength   Overall Strength Comments  UE: 4/5 gross;   LE: knees 4+/5; Hips 4-/5;       Ambulation/Gait   Gait Comments  Increased lateral trunk sway      Balance   Balance Assessed  Yes      Standardized Balance Assessment   Standardized Balance Assessment  Timed Up and Go Test;Berg Balance Test;Dynamic Gait Index      Berg Balance Test   Sit to Stand  Able to stand without using hands and stabilize independently    Standing Unsupported  Able to stand safely 2 minutes    Sitting with Back Unsupported but Feet Supported on Floor or Stool  Able to sit safely and securely 2 minutes    Stand to Sit  Sits safely with minimal use of hands    Transfers  Able to transfer safely, minor use of hands    Standing Unsupported with Eyes Closed  Able to stand 10 seconds safely    Standing Ubsupported with Feet Together  Able to place feet together independently and stand for 1 minute with supervision    From Standing, Reach Forward with Outstretched Arm  Can reach confidently >25 cm (10")    From Standing Position, Pick up Object from Floor   Unable to pick up shoe, but reaches 2-5 cm (1-2") from shoe and balances independently    From Standing Position, Turn to Look Behind Over each Shoulder  Turn sideways only but maintains balance    Turn 360 Degrees  Able to turn 360 degrees safely but slowly    Standing Unsupported, Alternately Place Feet on Step/Stool  Able to complete >2 steps/needs minimal assist    Standing Unsupported, One Foot in [employer] balance while stepping or standing    Standing on One Leg  Tries to lift leg/unable to hold 3 seconds but remains standing independently    Total Score  39      Dynamic Gait Index   Level Surface  Mild Impairment    Change in Gait Speed  Normal    Gait with Horizontal Head Turns  Mild Impairment    Gait with Vertical Head Turns  Mild Impairment    Gait and Pivot Turn  Mild Impairment    Step Over Obstacle  Mild Impairment    Step Around Obstacles  Normal    Steps  Moderate Impairment    Total Score  17      Timed Up and Go Test   Normal TUG (seconds)  15                Objective measurements completed on examination: See above findings.              PT Education - 07/13/18 1207    Education Details  PT POC, home safety, fall risk.    Person(s) Educated  Patient    Methods  Explanation    Comprehension  Verbalized understanding       PT Short Term Goals - 07/13/18 1425      PT SHORT TERM GOAL #1   Title  Pt to be independent with initial HEP for LE strength.     Time  2    Period  Weeks    Status  New    Target Date  07/27/18        PT Long Term  Goals - 07/13/18 1427      PT LONG TERM GOAL #1   Title  Pt to be independent with final HEP for LE strength and balance     Time  6    Period  Weeks    Status  New    Target Date  08/24/18      PT LONG TERM GOAL #2   Title  Pt to demo improved score on BERG by at least 5 points, to improve fall risk .    Time  6    Period  Weeks    Status  New    Target Date  08/24/18      PT LONG  TERM GOAL #3   Title  Pt to demo improved score on DGI by at least 3 points, to decrease risk for falls with gait.     Time  6    Period  Weeks    Status  New    Target Date  08/24/18      PT LONG TERM GOAL #4   Title  Pt to demo ability for safe navigation of 5 steps, with 1 hand rail, with reciprocol pattern, to improve safety with community activities.     Time  6    Period  Weeks    Status  New    Target Date  08/24/18             Plan - 07/13/18 1211    Clinical Impression Statement  Pt presents with primary complaint of decreased balance, and increased falls. Pt with formal balance testing done today, scores indicating that he is a fall risk. He has decreased dynamic balance, and decreased safety with dynamic movement. He has mild LE weakness and will benefit from education on HEP for this. Pt with mild gait deficits, with increased lateral trunk sway, likely due ot hip weakness. Pt with poor ability, safety and confidence for stairs. Pt to benefit from skilled care to improve deficits, and improve safety and fall risk.     Clinical Presentation  Stable    Clinical Decision Making  Low    Rehab Potential  Good    PT Frequency  2x / week    PT Duration  6 weeks    PT Treatment/Interventions  ADLs/Self Care Home Management;Cryotherapy;Electrical Stimulation;Iontophoresis 4mg /ml Dexamethasone;Functional mobility training;Stair training;Gait training;Ultrasound;Therapeutic activities;Moist Heat;Therapeutic exercise;Balance training;Neuromuscular re-education;Patient/family education;Passive range of motion;Manual techniques;Dry needling;Taping    PT Next Visit Plan  Teach HEP for LE strength    Consulted and Agree with Plan of Care  Patient       Patient will benefit from skilled therapeutic intervention in order to improve the following deficits and impairments:  Abnormal gait, Decreased coordination, Difficulty walking, Decreased activity tolerance, Decreased balance, Improper  body mechanics, Decreased strength, Decreased mobility, Decreased endurance  Visit Diagnosis: Other abnormalities of gait and mobility     Problem List Patient Active Problem List   Diagnosis Date Noted  . Hyperlipidemia 08/05/2015  . Coronary artery disease due to lipid rich plaque 08/05/2015  . Hx of colonic polyps 06/12/2015  . Chronic anticoagulation 06/12/2015  . BRBPR (bright red blood per rectum) 05/26/2015  . 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 Dermatitis of ear canal 05/07/2015  . Left knee pain 02/05/2015  . Encounter for therapeutic drug monitoring 11/06/2013  . Atrial fibrillation ([place]) 07/24/2013  . Long term current use of anticoagulant therapy 07/24/2013  . Bilateral leg weakness 06/07/2013  . 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 Scrotal mass 03/07/2012  . General medical examination 02/28/2012  . Pain in toe 06/23/2011  . ARM, UPPER, PAIN 05/29/2010  . ANEMIA 02/18/2010  . CELLULITIS, RIGHT KNEE 02/18/2010  . HEARING LOSS 12/22/2009  . IRREGULAR HEART RATE 11/04/2008  . ASPARTATE AMINOTRANSFERASE, SERUM, ELEVATED 11/04/2008  . LEG CRAMPS, NOCTURNAL 08/16/2008  . Nevus 05/12/2007  . Diabetes mellitus type II, controlled ([place]) 05/12/2007  . Essential hypertension 03/27/2007  . CORONARY ARTERY DISEASE 03/27/2007    Lyndee Hensen, PT, DPT 2:30 PM  07/13/18    [place], [place], [address] Phone: [phone]   Fax:  [phone]  Name: RAYON MCCHRISTIAN MRN: [id number] Date of Birth: [birth year]

## 2018-07-18 ENCOUNTER — Ambulatory Visit (INDEPENDENT_AMBULATORY_CARE_PROVIDER_SITE_OTHER): Payer: Medicare HMO | Admitting: Physical Therapy

## 2018-07-18 ENCOUNTER — Encounter: Payer: Self-pay | Admitting: Physical Therapy

## 2018-07-18 DIAGNOSIS — R2689 Other abnormalities of gait and mobility: Secondary | ICD-10-CM

## 2018-07-18 NOTE — Patient Instructions (Signed)
Access Code: [email or web address]  URL: [email or web address]  Date: 07/18/2018  Prepared by: Lyndee Hensen   Exercises  Seated Hamstring Stretch - 3 reps - 30 hold - 2x daily  Seated Knee Extension AROM - 10 reps - 2 sets - 2x daily  Seated Hip Abduction with Resistance - 10 reps - 2 sets - 2x daily  Standing Marching - 10 reps - 2 sets - 2x daily  Standing Hip Abduction - 10 reps - 2 sets - 1x daily

## 2018-07-18 NOTE — Therapy (Signed)
[facility] [address] [place], [place], [address] Phone: [phone]   Fax:  [phone]  Physical Therapy Treatment  Patient Details  Name: Marvin Foster MRN: [id number] Date of Birth: Jun 21, 1934 Referring Provider (PT): Raiford Noble, [place]   Encounter Date: 07/18/2018  PT End of Session - 07/18/18 1516    Visit Number  2    Number of Visits  12    Date for PT Re-Evaluation  08/24/18    Authorization Type  A[employer]na Medicare    PT Start Time  7893    PT Stop Time  1512    PT Time Calculation (min)  47 min    Equipment Utilized During Treatment  Gait belt    Activity Tolerance  Patient tolerated treatment well    Behavior During Therapy  [facility] for tasks assessed/performed       Past Medical History:  Diagnosis Date  . Arthritis 12/2005   left hand  . Arthritis    Left knee  . Blood transfusion without reported diagnosis   . Bursitis of knee    right knee  . CAD (coronary artery disease)   . Diabetes mellitus   . Glaucoma    left eye  . Hyperlipidemia   . Hypertension   . Neuropathy    with pain  . Spinal stenosis 01/20/05    Past Surgical History:  Procedure Laterality Date  . BACK SURGERY    . CATARACT EXTRACTION  08/11/09   right  . corneal endothelial Left 2013  . CORNEAL TRANSPLANT Left    eye  . CORONARY ANGIOPLASTY WITH STENT PLACEMENT    . EYE SURGERY    . SMALL INTESTINE SURGERY    . SPINE SURGERY    . VASECTOMY      There were no vitals filed for this visit.  Subjective Assessment - 07/18/18 1515    Subjective  Pt states he feels his L ankle has been "giving out on him" lately, and rolling laterally. He has not fallen.                        [facility] PT Treatment/Exercise - 07/18/18 0001      Ambulation/Gait   Gait Comments  35 ft x 7 with practice and cueing for heel to toe gait and anterior propulsion.       Exercises   Exercises  Knee/Hip      Knee/Hip Exercises:  Stretches   Active Hamstring Stretch  3 reps;30 seconds      Knee/Hip Exercises: Standing   Heel Raises  20 reps    Hip Flexion  20 reps;Knee bent    Hip Abduction  2 sets;10 reps;Both    Forward Step Up  10 reps;Step Height: 6";Hand Hold: 2;Both    Other Standing Knee Exercises  Weight shifts A/P and L/R x20 each;  Diagonal (pre-gait) weight shifts x20 each;       Knee/Hip Exercises: Seated   Long Arc Quad  2 sets;10 reps;Both    Sit to [employer]  5 reps               PT Short Term Goals - 07/13/18 1425      PT SHORT TERM GOAL #1   Title  Pt to be independent with initial HEP for LE strength.     Time  2    Period  Weeks    Status  New    Target Date  07/27/18        PT Long Term Goals - 07/13/18 1427      PT LONG TERM GOAL #1   Title  Pt to be independent with final HEP for LE strength and balance     Time  6    Period  Weeks    Status  New    Target Date  08/24/18      PT LONG TERM GOAL #2   Title  Pt to demo improved score on BERG by at least 5 points, to improve fall risk .    Time  6    Period  Weeks    Status  New    Target Date  08/24/18      PT LONG TERM GOAL #3   Title  Pt to demo improved score on DGI by at least 3 points, to decrease risk for falls with gait.     Time  6    Period  Weeks    Status  New    Target Date  08/24/18      PT LONG TERM GOAL #4   Title  Pt to demo ability for safe navigation of 5 steps, with 1 hand rail, with reciprocol pattern, to improve safety with community activities.     Time  6    Period  Weeks    Status  New    Target Date  08/24/18            Plan - 07/18/18 1517    Clinical Impression Statement  Pt educated on HEP for LE strength today. Also started on simple balance exercises and weight shifts. Pt with noted weakness and increased difficulty with ther ex and stairs on L vs R. Will benefit from continued strengthening, balance, gait, and stair work.     Rehab Potential  Good    PT Frequency  2x /  week    PT Duration  6 weeks    PT Treatment/Interventions  ADLs/Self Care Home Management;Cryotherapy;Electrical Stimulation;Iontophoresis 4mg /ml Dexamethasone;Functional mobility training;Stair training;Gait training;Ultrasound;Therapeutic activities;Moist Heat;Therapeutic exercise;Balance training;Neuromuscular re-education;Patient/family education;Passive range of motion;Manual techniques;Dry needling;Taping    PT Next Visit Plan  Teach HEP for LE strength    Consulted and Agree with Plan of Care  Patient       Patient will benefit from skilled therapeutic intervention in order to improve the following deficits and impairments:  Abnormal gait, Decreased coordination, Difficulty walking, Decreased activity tolerance, Decreased balance, Improper body mechanics, Decreased strength, Decreased mobility, Decreased endurance  Visit Diagnosis: Other abnormalities of gait and mobility     Problem List Patient Active Problem List   Diagnosis Date Noted  . Hyperlipidemia 08/05/2015  . Coronary artery disease due to lipid rich plaque 08/05/2015  . Hx of colonic polyps 06/12/2015  . Chronic anticoagulation 06/12/2015  . BRBPR (bright red blood per rectum) 05/26/2015  . 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 Dermatitis of ear canal 05/07/2015  . Left knee pain 02/05/2015  . Encounter for therapeutic drug monitoring 11/06/2013  . Atrial fibrillation ([place]) 07/24/2013  . Long term current use of anticoagulant therapy 07/24/2013  . Bilateral leg weakness 06/07/2013  . 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 Scrotal mass 03/07/2012  . General medical examination 02/28/2012  . Pain in toe 06/23/2011  . ARM, UPPER, PAIN 05/29/2010  . ANEMIA 02/18/2010  . CELLULITIS, RIGHT KNEE 02/18/2010  . HEARING LOSS 12/22/2009  . IRREGULAR HEART RATE 11/04/2008  . ASPARTATE AMINOTRANSFERASE,  SERUM, ELEVATED 11/04/2008  . LEG CRAMPS, NOCTURNAL 08/16/2008  . Nevus 05/12/2007  .  Diabetes mellitus type II, controlled ([place]) 05/12/2007  . Essential hypertension 03/27/2007  . CORONARY ARTERY DISEASE 03/27/2007    Lyndee Hensen, PT, DPT 3:19 PM  07/18/18    [place], [place], [address] Phone: [phone]   Fax:  [phone]  Name: XAIVER ROSKELLEY MRN: [id number] Date of Birth: 08-31-1934

## 2018-07-20 ENCOUNTER — Ambulatory Visit (INDEPENDENT_AMBULATORY_CARE_PROVIDER_SITE_OTHER): Payer: Medicare HMO | Admitting: Physical Therapy

## 2018-07-20 ENCOUNTER — Encounter: Payer: Self-pay | Admitting: Physical Therapy

## 2018-07-20 DIAGNOSIS — R2689 Other abnormalities of gait and mobility: Secondary | ICD-10-CM

## 2018-07-20 NOTE — Therapy (Signed)
[facility] [address] [place], [place], [address] Phone: [phone]   Fax:  [phone]  Physical Therapy Treatment  Patient Details  Name: Marvin Foster MRN: [id number] Date of Birth: 12/26/1933 Referring Provider (Marvin Foster): Raiford Noble, [place]   Encounter Date: 07/20/2018  Marvin Foster End of Session - 07/20/18 1623    Visit Number  3    Number of Visits  12    Date for Marvin Foster Re-Evaluation  08/24/18    Authorization Type  [employer]    Marvin Foster Start Time  1435    Marvin Foster Stop Time  1515    Marvin Foster Time Calculation (min)  40 min    Equipment Utilized During Treatment  Gait belt    Activity Tolerance  Patient tolerated treatment well    Behavior During Therapy  [facility] for tasks assessed/performed       Past Medical History:  Diagnosis Date  . Arthritis 12/2005   left hand  . Arthritis    Left knee  . Blood transfusion without reported diagnosis   . Bursitis of knee    right knee  . CAD (coronary artery disease)   . Diabetes mellitus   . Glaucoma    left eye  . Hyperlipidemia   . Hypertension   . Neuropathy    with pain  . Spinal stenosis 01/20/05    Past Surgical History:  Procedure Laterality Date  . BACK SURGERY    . CATARACT EXTRACTION  08/11/09   right  . corneal endothelial Left 2013  . CORNEAL TRANSPLANT Left    eye  . CORONARY ANGIOPLASTY WITH STENT PLACEMENT    . EYE SURGERY    . SMALL INTESTINE SURGERY    . SPINE SURGERY    . VASECTOMY      There were no vitals filed for this visit.  Subjective Assessment - 07/20/18 1623    Subjective  Marvin Foster with no new complaints    Currently in Pain?  No/denies                       [facility] Adult Marvin Foster Treatment/Exercise - 07/20/18 1435      Ambulation/Gait   Gait Comments  --      Exercises   Exercises  Knee/Hip      Knee/Hip Exercises: Stretches   Active Hamstring Stretch  3 reps;30 seconds      Knee/Hip Exercises: Standing   Heel Raises  20 reps    Hip Flexion  20  reps;Knee bent    Hip Abduction  2 sets;10 reps;Both    Forward Step Up  --    Gait Training  Side stepping 20 ft x4     Other Standing Knee Exercises  Weight shifts A/P x20 each;      Other Standing Knee Exercises  Toe taps x20 1 UE support;  Tandem stance 30 sec x2 bil;  1/2 tandem stance with head turns x10 bil;       Knee/Hip Exercises: Seated   Long Arc Quad  2 sets;10 reps;Both    Sit to [employer]  10 reps               Marvin Foster Short Term Goals - 07/13/18 1425      Marvin Foster SHORT TERM GOAL #1   Title  Marvin Foster to be independent with initial HEP for LE strength.     Time  2    Period  Weeks    Status  New  Target Date  07/27/18        Marvin Foster Long Term Goals - 07/13/18 1427      Marvin Foster LONG TERM GOAL #1   Title  Marvin Foster to be independent with final HEP for LE strength and balance     Time  6    Period  Weeks    Status  New    Target Date  08/24/18      Marvin Foster LONG TERM GOAL #2   Title  Marvin Foster to demo improved score on BERG by at least 5 points, to improve fall risk .    Time  6    Period  Weeks    Status  New    Target Date  08/24/18      Marvin Foster LONG TERM GOAL #3   Title  Marvin Foster to demo improved score on DGI by at least 3 points, to decrease risk for falls with gait.     Time  6    Period  Weeks    Status  New    Target Date  08/24/18      Marvin Foster LONG TERM GOAL #4   Title  Marvin Foster to demo ability for safe navigation of 5 steps, with 1 hand rail, with reciprocol pattern, to improve safety with community activities.     Time  6    Period  Weeks    Status  New    Target Date  08/24/18            Plan - 07/20/18 1626    Clinical Impression Statement  Marvin Foster performed strengthening ther ex for LEs, as well as NMR for balance. Marvin Foster able to progress balance today, requires therapist assist for minor LOB with tandem stance. Plan to progress as tolerated.     Rehab Potential  Good    Marvin Foster Frequency  2x / week    Marvin Foster Duration  6 weeks    Marvin Foster Treatment/Interventions  ADLs/Self Care Home  Management;Cryotherapy;Electrical Stimulation;Iontophoresis 4mg /ml Dexamethasone;Functional mobility training;Stair training;Gait training;Ultrasound;Therapeutic activities;Moist Heat;Therapeutic exercise;Balance training;Neuromuscular re-education;Patient/family education;Passive range of motion;Manual techniques;Dry needling;Taping    Marvin Foster Next Visit Plan  Teach HEP for LE strength    Consulted and Agree with Plan of Care  Patient       Patient will benefit from skilled therapeutic intervention in order to improve the following deficits and impairments:  Abnormal gait, Decreased coordination, Difficulty walking, Decreased activity tolerance, Decreased balance, Improper body mechanics, Decreased strength, Decreased mobility, Decreased endurance  Visit Diagnosis: Other abnormalities of gait and mobility     Problem List Patient Active Problem List   Diagnosis Date Noted  . Hyperlipidemia 08/05/2015  . Coronary artery disease due to lipid rich plaque 08/05/2015  . Hx of colonic polyps 06/12/2015  . Chronic anticoagulation 06/12/2015  . BRBPR (bright red blood per rectum) 05/26/2015  . 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 Dermatitis of ear canal 05/07/2015  . Left knee pain 02/05/2015  . Encounter for therapeutic drug monitoring 11/06/2013  . Atrial fibrillation ([place]) 07/24/2013  . Long term current use of anticoagulant therapy 07/24/2013  . Bilateral leg weakness 06/07/2013  . 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 Scrotal mass 03/07/2012  . General medical examination 02/28/2012  . Pain in toe 06/23/2011  . ARM, UPPER, PAIN 05/29/2010  . ANEMIA 02/18/2010  . CELLULITIS, RIGHT KNEE 02/18/2010  . HEARING LOSS 12/22/2009  . IRREGULAR HEART RATE 11/04/2008  . ASPARTATE AMINOTRANSFERASE, SERUM, ELEVATED 11/04/2008  . LEG CRAMPS,  NOCTURNAL 08/16/2008  . Nevus 05/12/2007  . Diabetes mellitus type II, controlled ([place]) 05/12/2007  . Essential hypertension  03/27/2007  . CORONARY ARTERY DISEASE 03/27/2007     Marvin Foster, Marvin Foster, Marvin Foster 4:29 PM  07/20/18   [facility] [place], [place], [address] Phone: [phone]   Fax:  [phone]  Name: Marvin Foster MRN: [id number] Date of Birth: [birth year]

## 2018-07-25 ENCOUNTER — Ambulatory Visit (INDEPENDENT_AMBULATORY_CARE_PROVIDER_SITE_OTHER): Payer: Medicare HMO | Admitting: Physical Therapy

## 2018-07-25 ENCOUNTER — Encounter: Payer: Self-pay | Admitting: Physical Therapy

## 2018-07-25 DIAGNOSIS — R2689 Other abnormalities of gait and mobility: Secondary | ICD-10-CM

## 2018-07-25 NOTE — Therapy (Signed)
[facility] [address] [place], [place], [address] Phone: [phone]   Fax:  [phone]  Physical Therapy Treatment  Patient Details  Name: Marvin Foster MRN: [id number] Date of Birth: 11/19/1933 Referring Provider (PT): Raiford Noble, [place]   Encounter Date: 07/25/2018  PT End of Session - 07/25/18 1547    Visit Number  4    Number of Visits  12    Date for PT Re-Evaluation  08/24/18    Authorization Type  [employer]na Medicare    PT Start Time  3646    PT Stop Time  1428    PT Time Calculation (min)  41 min    Equipment Utilized During Treatment  Gait belt    Activity Tolerance  Patient tolerated treatment well    Behavior During Therapy  [facility] for tasks assessed/performed       Past Medical History:  Diagnosis Date  . Arthritis 12/2005   left hand  . Arthritis    Left knee  . Blood transfusion without reported diagnosis   . Bursitis of knee    right knee  . CAD (coronary artery disease)   . Diabetes mellitus   . Glaucoma    left eye  . Hyperlipidemia   . Hypertension   . Neuropathy    with pain  . Spinal stenosis 01/20/05    Past Surgical History:  Procedure Laterality Date  . BACK SURGERY    . CATARACT EXTRACTION  08/11/09   right  . corneal endothelial Left 2013  . CORNEAL TRANSPLANT Left    eye  . CORONARY ANGIOPLASTY WITH STENT PLACEMENT    . EYE SURGERY    . SMALL INTESTINE SURGERY    . SPINE SURGERY    . VASECTOMY      There were no vitals filed for this visit.  Subjective Assessment - 07/25/18 1546    Subjective  Pt states that he has not had any LOB, or feelings of unsteadiness.     Currently in Pain?  No/denies                       [facility] Adult PT Treatment/Exercise - 07/25/18 1348      Exercises   Exercises  Knee/Hip      Knee/Hip Exercises: Stretches   Active Hamstring Stretch  3 reps;30 seconds      Knee/Hip Exercises: Aerobic   Stationary Bike  L1 x 8 min;       Knee/Hip Exercises: Standing   Heel Raises  --    Hip Flexion  20 reps;Knee bent    Hip Abduction  2 sets;10 reps;Both    Forward Step Up  10 reps;Step Height: 6";Hand Hold: 2;Both    Stairs  5 steps up/down 1 hand rail x6;     Gait Training  --    Other Standing Knee Exercises  Weight shifts A/P and L/R on Air Ex x20 each;  Step ups on AirEx x10 bil;      Other Standing Knee Exercises  Toe taps x20 1 UE support;  Tandem stance 30 sec x2 bil;  Lateral step up onto Aire Ex x20;       Knee/Hip Exercises: Seated   Long Arc Quad  --    Sit to [employer]               PT Short Term Goals - 07/13/18 1425      PT SHORT TERM  GOAL #1   Title  Pt to be independent with initial HEP for LE strength.     Time  2    Period  Weeks    Status  New    Target Date  07/27/18        PT Long Term Goals - 07/13/18 1427      PT LONG TERM GOAL #1   Title  Pt to be independent with final HEP for LE strength and balance     Time  6    Period  Weeks    Status  New    Target Date  08/24/18      PT LONG TERM GOAL #2   Title  Pt to demo improved score on BERG by at least 5 points, to improve fall risk .    Time  6    Period  Weeks    Status  New    Target Date  08/24/18      PT LONG TERM GOAL #3   Title  Pt to demo improved score on DGI by at least 3 points, to decrease risk for falls with gait.     Time  6    Period  Weeks    Status  New    Target Date  08/24/18      PT LONG TERM GOAL #4   Title  Pt to demo ability for safe navigation of 5 steps, with 1 hand rail, with reciprocol pattern, to improve safety with community activities.     Time  6    Period  Weeks    Status  New    Target Date  08/24/18            Plan - 07/25/18 1548    Clinical Impression Statement  Pt challenged with all balance activities today, but no LOB. He was challenged more with addition of Air Ex today. He has much difficulty with stair climbing with L LE, due to weakness. Plan to progress  strength and balance as tolerated.     Rehab Potential  Good    PT Frequency  2x / week    PT Duration  6 weeks    PT Treatment/Interventions  ADLs/Self Care Home Management;Cryotherapy;Electrical Stimulation;Iontophoresis 4mg /ml Dexamethasone;Functional mobility training;Stair training;Gait training;Ultrasound;Therapeutic activities;Moist Heat;Therapeutic exercise;Balance training;Neuromuscular re-education;Patient/family education;Passive range of motion;Manual techniques;Dry needling;Taping    PT Next Visit Plan  Teach HEP for LE strength    Consulted and Agree with Plan of Care  Patient       Patient will benefit from skilled therapeutic intervention in order to improve the following deficits and impairments:  Abnormal gait, Decreased coordination, Difficulty walking, Decreased activity tolerance, Decreased balance, Improper body mechanics, Decreased strength, Decreased mobility, Decreased endurance  Visit Diagnosis: Other abnormalities of gait and mobility     Problem List Patient Active Problem List   Diagnosis Date Noted  . Hyperlipidemia 08/05/2015  . Coronary artery disease due to lipid rich plaque 08/05/2015  . Hx of colonic polyps 06/12/2015  . Chronic anticoagulation 06/12/2015  . BRBPR (bright red blood per rectum) 05/26/2015  . 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 Dermatitis of ear canal 05/07/2015  . Left knee pain 02/05/2015  . Encounter for therapeutic drug monitoring 11/06/2013  . Atrial fibrillation ([place]) 07/24/2013  . Long term current use of anticoagulant therapy 07/24/2013  . Bilateral leg weakness 06/07/2013  . 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 Scrotal mass 03/07/2012  . General medical  examination 02/28/2012  . Pain in toe 06/23/2011  . ARM, UPPER, PAIN 05/29/2010  . ANEMIA 02/18/2010  . CELLULITIS, RIGHT KNEE 02/18/2010  . HEARING LOSS 12/22/2009  . IRREGULAR HEART RATE 11/04/2008  . ASPARTATE AMINOTRANSFERASE, SERUM,  ELEVATED 11/04/2008  . LEG CRAMPS, NOCTURNAL 08/16/2008  . Nevus 05/12/2007  . Diabetes mellitus type II, controlled ([place]) 05/12/2007  . Essential hypertension 03/27/2007  . CORONARY ARTERY DISEASE 03/27/2007    Lyndee Hensen, PT, DPT 3:50 PM  07/25/18    [facility] [place], [place], [address] Phone: [phone]   Fax:  [phone]  Name: RAYAAN GARGUILO MRN: [id number] Date of Birth: Jan 30, 1934

## 2018-07-27 ENCOUNTER — Ambulatory Visit (INDEPENDENT_AMBULATORY_CARE_PROVIDER_SITE_OTHER): Payer: Medicare HMO | Admitting: Physical Therapy

## 2018-07-27 ENCOUNTER — Encounter: Payer: Self-pay | Admitting: Physical Therapy

## 2018-07-27 DIAGNOSIS — R2689 Other abnormalities of gait and mobility: Secondary | ICD-10-CM | POA: Diagnosis not present

## 2018-07-27 NOTE — Therapy (Signed)
[facility] [address] [place], [place], [address] Phone: [phone]   Fax:  [phone]  Physical Therapy Treatment  Patient Details  Name: Marvin Foster MRN: [id number] Date of Birth: 12-12-1933 Referring Provider (PT): Raiford Noble, [place]   Encounter Date: 07/27/2018  PT End of Session - 07/27/18 1441    Visit Number  5    Number of Visits  12    Date for PT Re-Evaluation  08/24/18    Authorization Type  A[employer]na Medicare    PT Start Time  6333    PT Stop Time  1516    PT Time Calculation (min)  44 min    Equipment Utilized During Treatment  Gait belt    Activity Tolerance  Patient tolerated treatment well    Behavior During Therapy  [facility] [place] [facility] for tasks assessed/performed       Past Medical History:  Diagnosis Date  . Arthritis 12/2005   left hand  . Arthritis    Left knee  . Blood transfusion without reported diagnosis   . Bursitis of knee    right knee  . CAD (coronary artery disease)   . Diabetes mellitus   . Glaucoma    left eye  . Hyperlipidemia   . Hypertension   . Neuropathy    with pain  . Spinal stenosis 01/20/05    Past Surgical History:  Procedure Laterality Date  . BACK SURGERY    . CATARACT EXTRACTION  08/11/09   right  . corneal endothelial Left 2013  . CORNEAL TRANSPLANT Left    eye  . CORONARY ANGIOPLASTY WITH STENT PLACEMENT    . EYE SURGERY    . SMALL INTESTINE SURGERY    . SPINE SURGERY    . VASECTOMY      There were no vitals filed for this visit.  Subjective Assessment - 07/27/18 1440    Subjective  pt with no new complaints.     Currently in Pain?  No/denies                       [facility] Adult PT Treatment/Exercise - 07/27/18 1438      Ambulation/Gait   Gait Comments  Side stepping 25 ft x4;  ambulation with head turns 35 ft x8;       Exercises   Exercises  Knee/Hip      Knee/Hip Exercises: Stretches   Active Hamstring Stretch  --      Knee/Hip Exercises:  Aerobic   Stationary Bike  L1 x 8 min;       Knee/Hip Exercises: Standing   Heel Raises  20 reps    Hip Flexion  --    Hip Abduction  2 sets;10 reps;Both    Forward Step Up  10 reps;Step Height: 6";Hand Hold: 2;Both    Stairs  5 steps up/down 1 hand rail x6;     Other Standing Knee Exercises  Step ups on AirEx x10 bil;  Lateral step ups on AirEx x15;     Other Standing Knee Exercises  Toe taps x20 1 UE support;  Tandem stance 30 sec x2 bil;        Knee/Hip Exercises: Seated   Long Arc Quad  2 sets;10 reps;Both    Long Arc Quad Weight  2 lbs.    Sit to [employer]  --      Knee/Hip Exercises: Supine   Straight Leg Raises  1 set;10 reps;Both  PT Short Term Goals - 07/13/18 1425      PT SHORT TERM GOAL #1   Title  Pt to be independent with initial HEP for LE strength.     Time  2    Period  Weeks    Status  New    Target Date  07/27/18        PT Long Term Goals - 07/13/18 1427      PT LONG TERM GOAL #1   Title  Pt to be independent with final HEP for LE strength and balance     Time  6    Period  Weeks    Status  New    Target Date  08/24/18      PT LONG TERM GOAL #2   Title  Pt to demo improved score on BERG by at least 5 points, to improve fall risk .    Time  6    Period  Weeks    Status  New    Target Date  08/24/18      PT LONG TERM GOAL #3   Title  Pt to demo improved score on DGI by at least 3 points, to decrease risk for falls with gait.     Time  6    Period  Weeks    Status  New    Target Date  08/24/18      PT LONG TERM GOAL #4   Title  Pt to demo ability for safe navigation of 5 steps, with 1 hand rail, with reciprocol pattern, to improve safety with community activities.     Time  6    Period  Weeks    Status  New    Target Date  08/24/18            Plan - 07/27/18 1522    Clinical Impression Statement  Pt showing slight improvments with standing balance and ability for stair climbing. Weakness in L LE effecting stair  climbing as well as gait mechanics, causing increased lateral trunk lean. Pt to benefit from continued care.     Rehab Potential  Good    PT Frequency  2x / week    PT Duration  6 weeks    PT Treatment/Interventions  ADLs/Self Care Home Management;Cryotherapy;Electrical Stimulation;Iontophoresis 4mg /ml Dexamethasone;Functional mobility training;Stair training;Gait training;Ultrasound;Therapeutic activities;Moist Heat;Therapeutic exercise;Balance training;Neuromuscular re-education;Patient/family education;Passive range of motion;Manual techniques;Dry needling;Taping    PT Next Visit Plan  Teach HEP for LE strength    Consulted and Agree with Plan of Care  Patient       Patient will benefit from skilled therapeutic intervention in order to improve the following deficits and impairments:  Abnormal gait, Decreased coordination, Difficulty walking, Decreased activity tolerance, Decreased balance, Improper body mechanics, Decreased strength, Decreased mobility, Decreased endurance  Visit Diagnosis: Other abnormalities of gait and mobility     Problem List Patient Active Problem List   Diagnosis Date Noted  . Hyperlipidemia 08/05/2015  . Coronary artery disease due to lipid rich plaque 08/05/2015  . Hx of colonic polyps 06/12/2015  . Chronic anticoagulation 06/12/2015  . BRBPR (bright red blood per rectum) 05/26/2015  . 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 Dermatitis of ear canal 05/07/2015  . Left knee pain 02/05/2015  . Encounter for therapeutic drug monitoring 11/06/2013  . Atrial fibrillation ([place]) 07/24/2013  . Long term current use of anticoagulant therapy 07/24/2013  . Bilateral leg weakness 06/07/2013  . 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  Scrotal mass 03/07/2012  . General medical examination 02/28/2012  . Pain in toe 06/23/2011  . ARM, UPPER, PAIN 05/29/2010  . ANEMIA 02/18/2010  . CELLULITIS, RIGHT KNEE 02/18/2010  . HEARING LOSS 12/22/2009   . IRREGULAR HEART RATE 11/04/2008  . ASPARTATE AMINOTRANSFERASE, SERUM, ELEVATED 11/04/2008  . LEG CRAMPS, NOCTURNAL 08/16/2008  . Nevus 05/12/2007  . Diabetes mellitus type II, controlled ([place]) 05/12/2007  . Essential hypertension 03/27/2007  . CORONARY ARTERY DISEASE 03/27/2007    Lyndee Hensen, PT, DPT 3:24 PM  07/27/18    [facility] [place], [place], [address] Phone: [phone]   Fax:  [phone]  Name: Marvin Foster MRN: [id number] Date of Birth: [birth year]

## 2018-08-01 ENCOUNTER — Encounter: Payer: Medicare HMO | Admitting: Physical Therapy

## 2018-08-01 ENCOUNTER — Encounter: Payer: Self-pay | Admitting: Physical Therapy

## 2018-08-01 ENCOUNTER — Ambulatory Visit (INDEPENDENT_AMBULATORY_CARE_PROVIDER_SITE_OTHER): Payer: Medicare HMO | Admitting: Physical Therapy

## 2018-08-01 DIAGNOSIS — R2689 Other abnormalities of gait and mobility: Secondary | ICD-10-CM

## 2018-08-01 NOTE — Therapy (Signed)
[facility] [address] [place], [place], [address] Phone: [phone]   Fax:  [phone]  Physical Therapy Treatment  Patient Details  Name: Marvin Foster MRN: [id number] Date of Birth: 05-16-1934 Referring Provider (PT): Raiford Noble, [place]   Encounter Date: 08/01/2018  PT End of Session - 08/01/18 1357    Visit Number  6    Number of Visits  12    Date for PT Re-Evaluation  08/24/18    Authorization Type  [employer] Medicare    PT Start Time  3151    PT Stop Time  1351    PT Time Calculation (min)  43 min    Activity Tolerance  Patient tolerated treatment well    Behavior During Therapy  [facility] for tasks assessed/performed       Past Medical History:  Diagnosis Date  . Arthritis 12/2005   left hand  . Arthritis    Left knee  . Blood transfusion without reported diagnosis   . Bursitis of knee    right knee  . CAD (coronary artery disease)   . Diabetes mellitus   . Glaucoma    left eye  . Hyperlipidemia   . Hypertension   . Neuropathy    with pain  . Spinal stenosis 01/20/05    Past Surgical History:  Procedure Laterality Date  . BACK SURGERY    . CATARACT EXTRACTION  08/11/09   right  . corneal endothelial Left 2013  . CORNEAL TRANSPLANT Left    eye  . CORONARY ANGIOPLASTY WITH STENT PLACEMENT    . EYE SURGERY    . SMALL INTESTINE SURGERY    . SPINE SURGERY    . VASECTOMY      There were no vitals filed for this visit.  Subjective Assessment - 08/01/18 1311    Subjective  doing well, no recent falls.    Currently in Pain?  No/denies                       [facility] Adult PT Treatment/Exercise - 08/01/18 1312      Exercises   Exercises  Knee/Hip      Knee/Hip Exercises: Aerobic   Stationary Bike  L2 x 8 min      Knee/Hip Exercises: Standing   Heel Raises  20 reps    Heel Raises Limitations  toe raises x 20 reps    Knee Flexion  Both;2 sets;10 reps    Knee Flexion Limitations  2#    Hip  Abduction  2 sets;10 reps;Both    Abduction Limitations  2#    Hip Extension  Both;2 sets;10 reps;Knee straight    Extension Limitations  2#    SLS  10 x 5 sec with 1 UE support bil    Other Standing Knee Exercises  Step ups on AirEx x10 bil;  Lateral step ups on AirEx x15;     Other Standing Knee Exercises  alternating hip abduction with green theraband x 20 bil      Knee/Hip Exercises: Seated   Long Arc Quad  2 sets;10 reps;Both    Long Arc Quad Weight  2 lbs.    Marching  Both;2 sets;10 reps;Weights    Marching Weights  2 lbs.    Sit to Sand  2 sets;10 reps;without UE support               PT Short Term Goals - 08/01/18 1357  PT SHORT TERM GOAL #1   Title  Pt to be independent with initial HEP for LE strength.     Baseline  10/22: pt verbalized HEP correctly    Time  2    Period  Weeks    Status  Achieved        PT Long Term Goals - 07/13/18 1427      PT LONG TERM GOAL #1   Title  Pt to be independent with final HEP for LE strength and balance     Time  6    Period  Weeks    Status  New    Target Date  08/24/18      PT LONG TERM GOAL #2   Title  Pt to demo improved score on BERG by at least 5 points, to improve fall risk .    Time  6    Period  Weeks    Status  New    Target Date  08/24/18      PT LONG TERM GOAL #3   Title  Pt to demo improved score on DGI by at least 3 points, to decrease risk for falls with gait.     Time  6    Period  Weeks    Status  New    Target Date  08/24/18      PT LONG TERM GOAL #4   Title  Pt to demo ability for safe navigation of 5 steps, with 1 hand rail, with reciprocol pattern, to improve safety with community activities.     Time  6    Period  Weeks    Status  New    Target Date  08/24/18            Plan - 08/01/18 1358    Clinical Impression Statement  Pt tolerated session well today and anticipate he will be ready for d/c next 1-2 weeks.  Pt with difficulty with SLS activities due to weakness.   Progressing well with PT and compliant with HEP.    Rehab Potential  Good    PT Frequency  2x / week    PT Duration  6 weeks    PT Treatment/Interventions  ADLs/Self Care Home Management;Cryotherapy;Electrical Stimulation;Iontophoresis 4mg /ml Dexamethasone;Functional mobility training;Stair training;Gait training;Ultrasound;Therapeutic activities;Moist Heat;Therapeutic exercise;Balance training;Neuromuscular re-education;Patient/family education;Passive range of motion;Manual techniques;Dry needling;Taping    PT Next Visit Plan  continue balance, ?check goals for possible d/c    Consulted and Agree with Plan of Care  Patient       Patient will benefit from skilled therapeutic intervention in order to improve the following deficits and impairments:  Abnormal gait, Decreased coordination, Difficulty walking, Decreased activity tolerance, Decreased balance, Improper body mechanics, Decreased strength, Decreased mobility, Decreased endurance  Visit Diagnosis: Other abnormalities of gait and mobility     Problem List Patient Active Problem List   Diagnosis Date Noted  . Hyperlipidemia 08/05/2015  . Coronary artery disease due to lipid rich plaque 08/05/2015  . Hx of colonic polyps 06/12/2015  . Chronic anticoagulation 06/12/2015  . BRBPR (bright red blood per rectum) 05/26/2015  . 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 Dermatitis of ear canal 05/07/2015  . Left knee pain 02/05/2015  . Encounter for therapeutic drug monitoring 11/06/2013  . Atrial fibrillation ([place]) 07/24/2013  . Long term current use of anticoagulant therapy 07/24/2013  . Bilateral leg weakness 06/07/2013  . 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 Scrotal mass 03/07/2012  . General  medical examination 02/28/2012  . Pain in toe 06/23/2011  . ARM, UPPER, PAIN 05/29/2010  . ANEMIA 02/18/2010  . CELLULITIS, RIGHT KNEE 02/18/2010  . HEARING LOSS 12/22/2009  . IRREGULAR HEART RATE 11/04/2008  .  ASPARTATE AMINOTRANSFERASE, SERUM, ELEVATED 11/04/2008  . LEG CRAMPS, NOCTURNAL 08/16/2008  . Nevus 05/12/2007  . Diabetes mellitus type II, controlled ([place]) 05/12/2007  . Essential hypertension 03/27/2007  . CORONARY ARTERY DISEASE 03/27/2007      Laureen Abrahams, PT, DPT 08/01/18 2:00 PM    [facility] [place], [place], [address] Phone: [phone]   Fax:  [phone]  Name: ONESIMO LINGARD MRN: [id number] Date of Birth: 1933/10/22

## 2018-08-03 ENCOUNTER — Encounter: Payer: Self-pay | Admitting: Physical Therapy

## 2018-08-03 ENCOUNTER — Ambulatory Visit (INDEPENDENT_AMBULATORY_CARE_PROVIDER_SITE_OTHER): Payer: Medicare HMO | Admitting: Physical Therapy

## 2018-08-03 DIAGNOSIS — R2689 Other abnormalities of gait and mobility: Secondary | ICD-10-CM

## 2018-08-03 NOTE — Patient Instructions (Signed)
Access Code: [email or web address]  URL: [email or web address]  Date: 08/03/2018  Prepared by: Lyndee Hensen   Exercises  Seated Hamstring Stretch - 3 reps - 30 hold - 2x daily  Seated Knee Extension AROM - 10 reps - 2 sets - 2x daily  Seated Hip Abduction with Resistance - 10 reps - 2 sets - 2x daily  Standing Marching - 10 reps - 2 sets - 2x daily  Standing Hip Abduction - 10 reps - 2 sets - 1x daily  Step Up - 10 reps - 1 sets - 1x daily  Standing Heel Raise - 10 reps - 2 sets - 1x daily  Standing Weight Shift - 10 reps - 1 sets - 1x daily  [place] - 10 reps - 1 sets - 1x daily  Tandem Stance with Support - 3 reps - 30 hold - 1x daily

## 2018-08-03 NOTE — Therapy (Signed)
[facility] [address] [place], [place], [address] Phone: [phone]   Fax:  [phone]  Physical Therapy Treatment/Discharge  Patient Details  Name: Marvin Foster MRN: [id number] Date of Birth: 01/02/1934 Referring Provider (PT): Raiford Noble, [place]   Encounter Date: 08/03/2018  PT End of Session - 08/03/18 1621    Visit Number  7    Number of Visits  12    Date for PT Re-Evaluation  08/24/18    Authorization Type  A[employer]na Medicare    PT Start Time  0254    PT Stop Time  1520    PT Time Calculation (min)  42 min    Equipment Utilized During Treatment  Gait belt    Activity Tolerance  Patient tolerated treatment well    Behavior During Therapy  [facility] for tasks assessed/performed       Past Medical History:  Diagnosis Date  . Arthritis 12/2005   left hand  . Arthritis    Left knee  . Blood transfusion without reported diagnosis   . Bursitis of knee    right knee  . CAD (coronary artery disease)   . Diabetes mellitus   . Glaucoma    left eye  . Hyperlipidemia   . Hypertension   . Neuropathy    with pain  . Spinal stenosis 01/20/05    Past Surgical History:  Procedure Laterality Date  . BACK SURGERY    . CATARACT EXTRACTION  08/11/09   right  . corneal endothelial Left 2013  . CORNEAL TRANSPLANT Left    eye  . CORONARY ANGIOPLASTY WITH STENT PLACEMENT    . EYE SURGERY    . SMALL INTESTINE SURGERY    . SPINE SURGERY    . VASECTOMY      There were no vitals filed for this visit.  Subjective Assessment - 08/03/18 1620    Subjective  Pt with no new complaints. He has been doing HEP. Feels more confident on stairs.     Currently in Pain?  No/denies    Multiple Pain Sites  No         [facility] PT Assessment - 08/03/18 0001      Berg Balance Test   Sit to Stand  Able to stand without using hands and stabilize independently    Standing Unsupported  Able to stand safely 2 minutes    Sitting with Back Unsupported  but Feet Supported on Floor or Stool  Able to sit safely and securely 2 minutes    Stand to Sit  Sits safely with minimal use of hands    Transfers  Able to transfer safely, minor use of hands    Standing Unsupported with Eyes Closed  Able to stand 10 seconds safely    Standing Ubsupported with Feet Together  Able to place feet together independently and stand 1 minute safely    From Standing, Reach Forward with Outstretched Arm  Can reach confidently >25 cm (10")    From Standing Position, Pick up Object from Floor  Able to pick up shoe safely and easily    From Standing Position, Turn to Look Behind Over each Shoulder  Turn sideways only but maintains balance    Turn 360 Degrees  Able to turn 360 degrees safely but slowly    Standing Unsupported, Alternately Place Feet on Step/Stool  Able to complete 4 steps without aid or supervision    Standing Unsupported, One Foot in [place] to take  small step independently and hold 30 seconds    Standing on One Leg  Tries to lift leg/unable to hold 3 seconds but remains standing independently    Total Score  45      Dynamic Gait Index   Level Surface  Normal    Change in Gait Speed  Normal    Gait with Horizontal Head Turns  Mild Impairment    Gait with Vertical Head Turns  Mild Impairment    Gait and Pivot Turn  Normal    Step Over Obstacle  Mild Impairment    Step Around Obstacles  Normal    Steps  Mild Impairment    Total Score  20      Timed Up and Go Test   Normal TUG (seconds)  11.29                   [facility] Adult PT Treatment/Exercise - 08/03/18 1445      Exercises   Exercises  Knee/Hip      Knee/Hip Exercises: Aerobic   Stationary Bike  L2 x 8 min      Knee/Hip Exercises: Standing   Heel Raises  20 reps    Heel Raises Limitations  --    Knee Flexion  Both;2 sets;10 reps    Knee Flexion Limitations  2#    Hip Abduction  2 sets;10 reps;Both    Abduction Limitations  2#    Hip Extension  --    Extension  Limitations  --    SLS  10 x 5 sec with 1 UE support bil    Other Standing Knee Exercises  --    Other Standing Knee Exercises  Corner balance review for HEP L/R and A/P weight shifts x20 each; Tandem stance 30 sec x2 each bil;       Knee/Hip Exercises: Seated   Long Arc Quad  --    Long Arc [employer]  --    Marching  --    Film/video editor  --    Sit to [employer]  2 sets;10 reps;without UE support             PT Education - 08/03/18 1621    Education Details  Final HEP reviewed    Person(s) Educated  Patient    Methods  Explanation;Handout    Comprehension  Verbalized understanding       PT Short Term Goals - 08/01/18 1357      PT SHORT TERM GOAL #1   Title  Pt to be independent with initial HEP for LE strength.     Baseline  10/22: pt verbalized HEP correctly    Time  2    Period  Weeks    Status  Achieved        PT Long Term Goals - 08/03/18 1622      PT LONG TERM GOAL #1   Title  Pt to be independent with final HEP for LE strength and balance     Time  6    Period  Weeks    Status  Achieved      PT LONG TERM GOAL #2   Title  Pt to demo improved score on BERG by at least 5 points, to improve fall risk .    Time  6    Period  Weeks    Status  Achieved      PT LONG TERM GOAL #3   Title  Pt to demo improved score on  DGI by at least 3 points, to decrease risk for falls with gait.     Time  6    Period  Weeks    Status  Achieved      PT LONG TERM GOAL #4   Title  Pt to demo ability for safe navigation of 5 steps, with 1 hand rail, with reciprocol pattern, to improve safety with community activities.     Time  6    Period  Weeks    Status  Achieved            Plan - 08/03/18 1622    Clinical Impression Statement  Pt has met all goals for PT, and is ready for d/c to HEP. He does have mild strength deficits in L LE, and will continue to address this with HEP. He is indpendent/safe for all activities done in clinic and with HEP. Pt with no LOB with any  activities, and has improved scores on all balance tests. Final HEp reviewed in detail today.     Rehab Potential  Good    PT Frequency  2x / week    PT Duration  6 weeks    PT Treatment/Interventions  ADLs/Self Care Home Management;Cryotherapy;Electrical Stimulation;Iontophoresis '4mg'$ /ml Dexamethasone;Functional mobility training;Stair training;Gait training;Ultrasound;Therapeutic activities;Moist Heat;Therapeutic exercise;Balance training;Neuromuscular re-education;Patient/family education;Passive range of motion;Manual techniques;Dry needling;Taping    PT Next Visit Plan  continue balance, ?check goals for possible d/c    Consulted and Agree with Plan of Care  Patient       Patient will benefit from skilled therapeutic intervention in order to improve the following deficits and impairments:  Abnormal gait, Decreased coordination, Difficulty walking, Decreased activity tolerance, Decreased balance, Improper body mechanics, Decreased strength, Decreased mobility, Decreased endurance  Visit Diagnosis: Other abnormalities of gait and mobility     Problem List Patient Active Problem List   Diagnosis Date Noted  . Hyperlipidemia 08/05/2015  . Coronary artery disease due to lipid rich plaque 08/05/2015  . Hx of colonic polyps 06/12/2015  . Chronic anticoagulation 06/12/2015  . BRBPR (bright red blood per rectum) 05/26/2015  . 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 Dermatitis of ear canal 05/07/2015  . Left knee pain 02/05/2015  . Encounter for therapeutic drug monitoring 11/06/2013  . Atrial fibrillation ([place]) 07/24/2013  . Long term current use of anticoagulant therapy 07/24/2013  . Bilateral leg weakness 06/07/2013  . 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 Scrotal mass 03/07/2012  . General medical examination 02/28/2012  . Pain in toe 06/23/2011  . ARM, UPPER, PAIN 05/29/2010  . ANEMIA 02/18/2010  . CELLULITIS, RIGHT KNEE 02/18/2010  . HEARING LOSS  12/22/2009  . IRREGULAR HEART RATE 11/04/2008  . ASPARTATE AMINOTRANSFERASE, SERUM, ELEVATED 11/04/2008  . LEG CRAMPS, NOCTURNAL 08/16/2008  . Nevus 05/12/2007  . Diabetes mellitus type II, controlled ([place]) 05/12/2007  . Essential hypertension 03/27/2007  . CORONARY ARTERY DISEASE 03/27/2007    Lyndee Hensen 08/03/2018, 4:24 PM  [facility] [address] [place], [place], [address] Phone: [phone]   Fax:  [phone]  Name: Marvin Foster MRN: [id number] Date of Birth: 04/11/1934   Lyndee Hensen, PT, DPT 4:25 PM  08/03/18    PHYSICAL THERAPY DISCHARGE SUMMARY  Visits from Start of Care:  7   Plan: Patient agrees to discharge.  Patient goals were met. Patient is being discharged due to meeting the stated rehab goals.  ?????     Lyndee Hensen, PT, DPT 4:25 PM  08/03/18    

## 2018-08-08 DIAGNOSIS — H401112 Primary open-angle glaucoma, right eye, moderate stage: Secondary | ICD-10-CM | POA: Diagnosis not present

## 2018-08-08 DIAGNOSIS — H1851 Endothelial corneal dystrophy: Secondary | ICD-10-CM | POA: Diagnosis not present

## 2018-08-08 DIAGNOSIS — Z947 Corneal transplant status: Secondary | ICD-10-CM | POA: Diagnosis not present

## 2018-08-08 DIAGNOSIS — H401123 Primary open-angle glaucoma, left eye, severe stage: Secondary | ICD-10-CM | POA: Diagnosis not present

## 2018-08-09 ENCOUNTER — Ambulatory Visit: Payer: Medicare HMO | Admitting: *Deleted

## 2018-08-09 DIAGNOSIS — I4891 Unspecified atrial fibrillation: Secondary | ICD-10-CM | POA: Diagnosis not present

## 2018-08-09 DIAGNOSIS — Z5181 Encounter for therapeutic drug level monitoring: Secondary | ICD-10-CM

## 2018-08-09 LAB — POCT INR: INR: 2.9 (ref 2.0–3.0)

## 2018-08-09 NOTE — Patient Instructions (Signed)
Description   Continue same dose of coumadin 1/2 tablet (2.5mg ) daily except 1 tablet (5mg ) on Tuesdays and Saturdays.  Recheck INR in 6 weeks

## 2018-08-10 ENCOUNTER — Ambulatory Visit (INDEPENDENT_AMBULATORY_CARE_PROVIDER_SITE_OTHER): Payer: Medicare HMO

## 2018-08-10 DIAGNOSIS — Z23 Encounter for immunization: Secondary | ICD-10-CM

## 2018-08-31 DIAGNOSIS — H401112 Primary open-angle glaucoma, right eye, moderate stage: Secondary | ICD-10-CM | POA: Diagnosis not present

## 2018-08-31 DIAGNOSIS — H401123 Primary open-angle glaucoma, left eye, severe stage: Secondary | ICD-10-CM | POA: Diagnosis not present

## 2018-08-31 DIAGNOSIS — Z961 Presence of intraocular lens: Secondary | ICD-10-CM | POA: Diagnosis not present

## 2018-08-31 DIAGNOSIS — Z947 Corneal transplant status: Secondary | ICD-10-CM | POA: Diagnosis not present

## 2018-09-05 ENCOUNTER — Other Ambulatory Visit: Payer: Self-pay | Admitting: Family Medicine

## 2018-09-06 ENCOUNTER — Other Ambulatory Visit: Payer: Self-pay | Admitting: Family Medicine

## 2018-09-20 ENCOUNTER — Ambulatory Visit: Payer: Medicare HMO | Admitting: *Deleted

## 2018-09-20 DIAGNOSIS — I4891 Unspecified atrial fibrillation: Secondary | ICD-10-CM | POA: Diagnosis not present

## 2018-09-20 DIAGNOSIS — Z5181 Encounter for therapeutic drug level monitoring: Secondary | ICD-10-CM | POA: Diagnosis not present

## 2018-09-20 LAB — POCT INR: INR: 2.5 (ref 2.0–3.0)

## 2018-09-20 NOTE — Patient Instructions (Addendum)
Description   Continue same dose of coumadin 1/2 tablet (2.5mg ) daily except 1 tablet (5mg ) on Tuesdays and Saturdays.  Recheck INR in 6 weeks. Call us with any medication changes or concerns # [phone] [facility], Main # [phone].

## 2018-09-21 ENCOUNTER — Other Ambulatory Visit: Payer: Self-pay

## 2018-09-21 ENCOUNTER — Ambulatory Visit (INDEPENDENT_AMBULATORY_CARE_PROVIDER_SITE_OTHER): Payer: Medicare HMO | Admitting: Family Medicine

## 2018-09-21 ENCOUNTER — Encounter: Payer: Self-pay | Admitting: Family Medicine

## 2018-09-21 ENCOUNTER — Encounter: Payer: Self-pay | Admitting: Internal Medicine

## 2018-09-21 VITALS — BP 120/74 | HR 68 | Temp 98.0°F | Resp 17 | Ht 69.0 in | Wt 231.5 lb

## 2018-09-21 DIAGNOSIS — I10 Essential (primary) hypertension: Secondary | ICD-10-CM | POA: Diagnosis not present

## 2018-09-21 DIAGNOSIS — E039 Hypothyroidism, unspecified: Secondary | ICD-10-CM

## 2018-09-21 DIAGNOSIS — E1139 Type 2 diabetes mellitus with other diabetic ophthalmic complication: Secondary | ICD-10-CM | POA: Diagnosis not present

## 2018-09-21 DIAGNOSIS — E785 Hyperlipidemia, unspecified: Secondary | ICD-10-CM

## 2018-09-21 HISTORY — DX: Hypothyroidism, unspecified: E03.9

## 2018-09-21 LAB — CBC WITH DIFFERENTIAL/PLATELET
Basophils Absolute: 0.1 10*3/uL (ref 0.0–0.1)
Basophils Relative: 0.9 % (ref 0.0–3.0)
Eosinophils Absolute: 0.3 10*3/uL (ref 0.0–0.7)
Eosinophils Relative: 5.1 % — ABNORMAL HIGH (ref 0.0–5.0)
HCT: 39.2 % (ref 39.0–52.0)
Hemoglobin: 13.4 g/dL (ref 13.0–17.0)
Lymphocytes Relative: 28.2 % (ref 12.0–46.0)
Lymphs Abs: 1.6 10*3/uL (ref 0.7–4.0)
MCHC: 34.2 g/dL (ref 30.0–36.0)
MCV: 93.4 fl (ref 78.0–100.0)
Monocytes Absolute: 0.6 10*3/uL (ref 0.1–1.0)
Monocytes Relative: 10.1 % (ref 3.0–12.0)
Neutro Abs: 3.1 10*3/uL (ref 1.4–7.7)
Neutrophils Relative %: 55.7 % (ref 43.0–77.0)
Platelets: 149 10*3/uL — ABNORMAL LOW (ref 150.0–400.0)
RBC: 4.19 Mil/uL — ABNORMAL LOW (ref 4.22–5.81)
RDW: 14.9 % (ref 11.5–15.5)
WBC: 5.6 10*3/uL (ref 4.0–10.5)

## 2018-09-21 LAB — HEPATIC FUNCTION PANEL
ALT: 21 U/L (ref 0–53)
AST: 24 U/L (ref 0–37)
Albumin: 4.3 g/dL (ref 3.5–5.2)
Alkaline Phosphatase: 43 U/L (ref 39–117)
Bilirubin, Direct: 0.3 mg/dL (ref 0.0–0.3)
Total Bilirubin: 1.2 mg/dL (ref 0.2–1.2)
Total Protein: 6.8 g/dL (ref 6.0–8.3)

## 2018-09-21 LAB — BASIC METABOLIC PANEL
BUN: 17 mg/dL (ref 6–23)
CO2: 28 mEq/L (ref 19–32)
Calcium: 9.2 mg/dL (ref 8.4–10.5)
Chloride: 105 mEq/L (ref 96–112)
Creatinine, Ser: 0.95 mg/dL (ref 0.40–1.50)
GFR: 80.51 mL/min (ref >60.00)
Glucose, Bld: 151 mg/dL — ABNORMAL HIGH (ref 70–99)
Potassium: 5.1 mEq/L (ref 3.5–5.1)
Sodium: 141 mEq/L (ref 135–145)

## 2018-09-21 LAB — TSH: TSH: 2.39 u[IU]/mL (ref 0.35–4.50)

## 2018-09-21 LAB — HEMOGLOBIN A1C: Hgb A1c MFr Bld: 6.6 % — ABNORMAL HIGH (ref 4.6–6.5)

## 2018-09-21 LAB — LIPID PANEL
Cholesterol: 97 mg/dL (ref 0–200)
HDL: 30.8 mg/dL — ABNORMAL LOW (ref >39.00)
LDL Cholesterol: 41 mg/dL (ref 0–99)
NonHDL: 66.21
Total CHOL/HDL Ratio: 3
Triglycerides: 128 mg/dL (ref 0.0–149.0)
VLDL: 25.6 mg/dL (ref 0.0–40.0)

## 2018-09-21 NOTE — Assessment & Plan Note (Signed)
Chronic problem.  On Metformin 1000mg  twice daily w/ hx of good control.  UTD on foot exam, eye exam, and on ACE for renal protection.  Applauded his attempts at diet/exercise and his recent weight loss.  Check labs.  Adjust meds prn

## 2018-09-21 NOTE — Assessment & Plan Note (Signed)
Chronic problem.  Tolerating statin w/o difficulty.  Check labs.  Adjust meds prn  

## 2018-09-21 NOTE — Patient Instructions (Signed)
Follow up in 3-4 months to recheck diabetes We'll notify you of your lab results and make any changes if needed Continue to work on healthy diet and regular exercise- you're doing great! Call Dr Hilarie Fredrickson (GI) to schedule a follow up visit Call with any questions or concerns Happy Holidays!!!

## 2018-09-21 NOTE — Progress Notes (Signed)
   Subjective:    Patient ID: Marvin Foster, male    DOB: 1934/06/15, 84 y.o.   MRN: [id number]  HPI DM- chronic problem, on Metformin 1000mg  BID w/ hx of good control.  UTD on foot exam, eye exam, and on ACE for renal protection.  Pt is down 4 lbs.  Denies symptomatic lows- 'it consistently stays in the 130s'.  No numbness/tingling of hands or feet that is new/different  Hyperlipidemia- chronic problem, on Lipitor 40mg  daily.  Denies abd pain, N/V.  HTN- chronic problem, on Enalapril 5mg  daily, metoprolol 100mg  BID.  No CP, SOB, HAs.  Hypothyroid- pt on Levothyroxine 54mcg daily.  Denies changes to skin/hair/nails   Review of Systems For ROS see HPI     Objective:   Physical Exam Vitals signs reviewed.  Constitutional:      General: He is not in acute distress.    Appearance: He is well-developed.  HENT:     Head: Normocephalic and atraumatic.  Eyes:     Conjunctiva/sclera: Conjunctivae normal.     Pupils: Pupils are equal, round, and reactive to light.  Neck:     Musculoskeletal: Normal range of motion and neck supple.     Thyroid: No thyromegaly.  Cardiovascular:     Rate and Rhythm: Normal rate and regular rhythm.     Heart sounds: Normal heart sounds. No murmur.  Pulmonary:     Effort: Pulmonary effort is normal. No respiratory distress.     Breath sounds: Normal breath sounds.  Abdominal:     General: Bowel sounds are normal. There is no distension.     Palpations: Abdomen is soft.  Musculoskeletal:     Right lower leg: Edema (trace) present.     Left lower leg: Edema (trace) present.  Lymphadenopathy:     Cervical: No cervical adenopathy.  Skin:    General: Skin is warm and dry.  Neurological:     Mental Status: He is alert and oriented to person, place, and time.     Cranial Nerves: No cranial nerve deficit.  Psychiatric:        Behavior: Behavior normal.           Assessment & Plan:

## 2018-09-21 NOTE — Assessment & Plan Note (Signed)
Chronic problem.  Well controlled today.  Asymptomatic.  Check labs.  No anticipated med changes.  Will follow. 

## 2018-09-21 NOTE — Assessment & Plan Note (Signed)
Relatively new problem for pt.  Currently asymptomatic.  Check labs.  Adjust meds prn

## 2018-09-22 ENCOUNTER — Encounter: Payer: Self-pay | Admitting: General Practice

## 2018-10-14 DIAGNOSIS — R69 Illness, unspecified: Secondary | ICD-10-CM | POA: Diagnosis not present

## 2018-10-19 ENCOUNTER — Ambulatory Visit: Payer: Medicare HMO | Admitting: Cardiology

## 2018-10-23 ENCOUNTER — Encounter: Payer: Self-pay | Admitting: *Deleted

## 2018-10-26 ENCOUNTER — Telehealth: Payer: Self-pay | Admitting: *Deleted

## 2018-10-26 ENCOUNTER — Ambulatory Visit: Payer: Medicare HMO | Admitting: Internal Medicine

## 2018-10-26 ENCOUNTER — Encounter: Payer: Self-pay | Admitting: Internal Medicine

## 2018-10-26 VITALS — BP 124/78 | HR 96 | Ht 69.0 in | Wt 231.0 lb

## 2018-10-26 DIAGNOSIS — Z86010 Personal history of colonic polyps: Secondary | ICD-10-CM | POA: Diagnosis not present

## 2018-10-26 DIAGNOSIS — Z7901 Long term (current) use of anticoagulants: Secondary | ICD-10-CM

## 2018-10-26 NOTE — Telephone Encounter (Signed)
Request for surgical clearance:     Endoscopy Procedure  What type of surgery is being performed?     colonoscopy  When is this surgery scheduled?     11/16/2018  What type of clearance is required ?   Pharmacy  Are there any medications that need to be held prior to surgery and how long? Coumadin, 5 days   Practice name and name of physician performing surgery?      [facility]  What is your office phone and fax number?      Phone- [phone]  [phone]  Anesthesia type (None, local, MAC, general) ?       MAC

## 2018-10-26 NOTE — Progress Notes (Signed)
Patient ID: Marvin Foster, male   DOB: 12/10/1933, 85 y.o.   MRN: [id number] HPI: Marvin Foster is an 85 year old male with a past medical history of multiple adenomatous colon polyps, colonic diverticulosis, hypertension, hyperlipidemia, diabetes, and CAD and atrial fibrillation on warfarin who is here to discuss repeat surveillance colonoscopy.  He is here alone today.  He reports he is doing well.  He is getting over a viral upper respiratory infection though he feels much better over the last week.  With this he had some mild loose stools which is not usual for him.  His stools have returned to regular, formed without blood or melena.  No diarrhea or constipation.  No abdominal pain.  No upper GI or hepatobiliary complaint.  He does take warfarin on a daily basis for his atrial fibrillation under the direction of Dr. Candee Furbish, his cardiologist.  Will be seeing Dr. Marlou Porch on 11/09/2018  He continues to be very active volunteering multiple days per week at [employer].  He denies chest pain or shortness of breath.  On 06/26/2015 he had a colonoscopy where 9 polyps were removed ranging 3 to 7 mm in size.  These were found to be tubular adenomas.  There was mild colonic diverticulosis in the left colon.  Past Medical History:  Diagnosis Date  . Arthritis 12/2005   left hand  . Arthritis    Left knee  . Blood transfusion without reported diagnosis   . Bursitis of knee    right knee  . CAD (coronary artery disease)   . Diabetes mellitus   . Diverticulosis   . Glaucoma    left eye  . Hyperlipidemia   . Hypertension   . Neuropathy    with pain  . Spinal stenosis 01/20/05  . Tubular adenoma of colon     Past Surgical History:  Procedure Laterality Date  . BACK SURGERY    . CATARACT EXTRACTION  08/11/09   right  . corneal endothelial Left 2013  . CORNEAL TRANSPLANT Left    eye  . CORONARY ANGIOPLASTY WITH STENT PLACEMENT    . EYE SURGERY    . SMALL INTESTINE SURGERY    .  SPINE SURGERY    . VASECTOMY      Outpatient Medications Prior to Visit  Medication Sig Dispense Refill  . aspirin EC 81 MG tablet Take by mouth.    [name] Kitchen atorvastatin (LIPITOR) 40 MG tablet TAKE 1 TABLET BY MOUTH AT BEDTIME 90 tablet 1  . Calcium Carbonate-Vitamin D (CALCIUM 600 + D PO) Take 1 tablet by mouth 2 (two) times daily.     . enalapril (VASOTEC) 5 MG tablet TAKE 1 TABLET BY MOUTH ONCE DAILY 90 tablet 3  . fluorometholone (FML) 0.1 % ophthalmic suspension Place 1 drop into both eyes once a week.     . gabapentin (NEURONTIN) 300 MG capsule     . Lancets (ONETOUCH ULTRASOFT) lancets Use one lancet each time sugars are tested, pt tests sugars twice daily. 100 each 12  . latanoprost (XALATAN) 0.005 % ophthalmic solution     . levothyroxine (SYNTHROID, LEVOTHROID) 50 MCG tablet TAKE 1 TABLET BY MOUTH ONCE DAILY 90 tablet 1  . metFORMIN (GLUCOPHAGE) 1000 MG tablet TAKE 1 TABLET BY MOUTH TWICE DAILY 180 tablet 1  . metoprolol tartrate (LOPRESSOR) 100 MG tablet TAKE 1 TABLET BY MOUTH TWICE DAILY 180 tablet 1  . Multiple Vitamin (MULTIVITAMIN) capsule Take 1 capsule by mouth daily.      [name] Kitchen  nystatin (MYCOSTATIN/NYSTOP) powder Apply topically 3 (three) times daily. 60 g 3  . Omega-3 Fatty Acids (FISH OIL) 1200 MG CAPS Take 3 capsules by mouth daily.    Marvin Foster VERIO test strip USE AS DIRECTED TWICE DAILY 100 each 12  . timolol (TIMOPTIC) 0.5 % ophthalmic solution Apply to eye.    . warfarin (COUMADIN) 2.5 MG tablet Take 1 tablet (2.5 mg total) by mouth as directed. 120 tablet 1   No facility-administered medications prior to visit.     Allergies  Allergen Reactions  . Neomycin-Bacitracin Zn-Polymyx Rash  . Penicillins Other (See Comments)    Pt states this was diagnosed years ago, unsure as to reaction. Will not use.     Family History  Problem Relation Age of Onset  . Heart failure Mother   . Heart failure Father   . Stomach cancer Neg Hx   . Colon cancer Neg Hx   . Pancreatic  cancer Neg Hx   . Throat cancer Neg Hx     Social History   Tobacco Use  . Smoking status: Never Smoker  . Smokeless tobacco: Never Used  Substance Use Topics  . Alcohol use: Yes    Alcohol/week: 0.0 standard drinks    Comment: Very seldom---socially  . Drug use: No    ROS: As per history of present illness, otherwise negative  BP 124/78   Pulse 96   Ht 5\' 9"  (1.753 m)   Wt 231 lb (104.8 kg)   BMI 34.11 kg/m  Gen: awake, alert, NAD HEENT: anicteric, op clear CV: RRR, no mrg Pulm: CTA b/l Abd: soft, NT/ND, +BS throughout Ext: no c/c/e Neuro: nonfocal   RELEVANT LABS AND IMAGING: CBC    Component Value Date/Time   WBC 5.6 09/21/2018 0955   RBC 4.19 (L) 09/21/2018 0955   HGB 13.4 09/21/2018 0955   HCT 39.2 09/21/2018 0955   PLT 149.0 (L) 09/21/2018 0955   MCV 93.4 09/21/2018 0955   MCHC 34.2 09/21/2018 0955   RDW 14.9 09/21/2018 0955   LYMPHSABS 1.6 09/21/2018 0955   MONOABS 0.6 09/21/2018 0955   EOSABS 0.3 09/21/2018 0955   BASOSABS 0.1 09/21/2018 0955    CMP     Component Value Date/Time   NA 141 09/21/2018 0955   K 5.1 09/21/2018 0955   CL 105 09/21/2018 0955   CO2 28 09/21/2018 0955   GLUCOSE 151 (H) 09/21/2018 0955   BUN 17 09/21/2018 0955   CREATININE 0.95 09/21/2018 0955   CALCIUM 9.2 09/21/2018 0955   PROT 6.8 09/21/2018 0955   ALBUMIN 4.3 09/21/2018 0955   AST 24 09/21/2018 0955   ALT 21 09/21/2018 0955   ALKPHOS 43 09/21/2018 0955   BILITOT 1.2 09/21/2018 0955   GFRNONAA 72.42 10/06/2010 0859   GFRAA 107 08/16/2008 1135    ASSESSMENT/PLAN: 85 year old male with a past medical history of multiple adenomatous colon polyps, colonic diverticulosis, hypertension, hyperlipidemia, diabetes, and CAD and atrial fibrillation on warfarin who is here to discuss repeat surveillance colonoscopy.   1.  History of multiple adenomatous colon polyps --we discussed surveillance colonoscopy today.  He is due for surveillance colonoscopy by guidelines.   We discussed how this test often stops after age 55 but this decision is individualized based on one's overall health.  He is feeling well and remaining quite active.  After our discussion of the risks, benefits and alternatives he wishes to proceed with surveillance colonoscopy.  I think this is reasonable.  We will schedule this  today.  We will need to hold warfarin and will seek permission from his prescribing provider.  2. Afib on warfarin -- Will hold warfarin 5 days prior to endoscopic procedures - will instruct when and how to resume after procedure. Benefits and risks of procedure explained including risks of bleeding, perforation, infection, missed lesions, reactions to medications and possible need for hospitalization and surgery for complications. Additional rare but real risk of stroke or other vascular clotting events off warfarin also explained and need to seek urgent help if any signs of these problems occur. Will communicate by phone or EMR with patient's  prescribing provider to confirm that holding warfarin is reasonable in this case.      GY:B[facility], Aundra Millet, Md [address] [place], [place] [address]

## 2018-10-26 NOTE — Patient Instructions (Signed)
You have been scheduled for a colonoscopy. Please follow written instructions given to you at your visit today.  Please pick up your prep supplies at the pharmacy within the next 1-3 days. If you use inhalers (even only as needed), please bring them with you on the day of your procedure. Your physician has requested that you go to www.startemmi.com and enter the access code given to you at your visit today. This web site gives a general overview about your procedure. However, you should still follow specific instructions given to you by our office regarding your preparation for the procedure.  You will be contaced by our office prior to your procedure for directions on holding your Coumadin/Warfarin.  If you do not hear from our office 1 week prior to your scheduled procedure, please call [phone] to discuss.  If you are age 47 or older, your body mass index should be between 23-30. Your Body mass index is 34.11 kg/m. If this is out of the aforementioned range listed, please consider follow up with your Primary Care Provider.  If you are age 32 or younger, your body mass index should be between 19-25. Your Body mass index is 34.11 kg/m. If this is out of the aformentioned range listed, please consider follow up with your Primary Care Provider.

## 2018-11-01 ENCOUNTER — Ambulatory Visit: Payer: Medicare HMO | Admitting: *Deleted

## 2018-11-01 ENCOUNTER — Telehealth: Payer: Self-pay | Admitting: Internal Medicine

## 2018-11-01 DIAGNOSIS — Z5181 Encounter for therapeutic drug level monitoring: Secondary | ICD-10-CM

## 2018-11-01 DIAGNOSIS — Z7901 Long term (current) use of anticoagulants: Secondary | ICD-10-CM

## 2018-11-01 DIAGNOSIS — Z86010 Personal history of colonic polyps: Secondary | ICD-10-CM

## 2018-11-01 DIAGNOSIS — I4891 Unspecified atrial fibrillation: Secondary | ICD-10-CM

## 2018-11-01 LAB — POCT INR: INR: 3.5 — AB (ref 2.0–3.0)

## 2018-11-01 MED ORDER — SUPREP BOWEL PREP KIT 17.5-3.13-1.6 GM/177ML PO SOLN: 1.0000 | ORAL | 0 refills | Status: DC

## 2018-11-01 NOTE — Telephone Encounter (Signed)
   Primary Cardiologist: Candee Furbish, MD  Chart reviewed as part of pre-operative protocol coverage.   Pharmacy reviewed patients chart and cleared patient to hold coumadin for 5 days prior to planned procedure 11/16/2018.    I will route this recommendation to the requesting party via Epic fax function and remove from pre-op pool.  Please call with questions.  Abigail Butts, PA-C 11/01/2018, 9:59 AM

## 2018-11-01 NOTE — Telephone Encounter (Signed)
Patient with diagnosis of afib on warfarin for anticoagulation.    Procedure: colonoscopy Date of procedure: 11/16/2018  CHADS2-VASc score of  5 (CHF, HTN, AGE, DM2, stroke/tia x 2, CAD, AGE, male)  Per office protocol, patient can hold warfarin for 5 days prior to procedure.

## 2018-11-01 NOTE — Telephone Encounter (Signed)
See previous phone note regarding anticoagulation.

## 2018-11-01 NOTE — Telephone Encounter (Signed)
I have spoken to patient to advise that per Dr Marlou Porch office, he may hold his coumadin 5 days prior to procedure. He verbalizes understanding of this.

## 2018-11-01 NOTE — Patient Instructions (Addendum)
Description   Do not take any Coumadin today then continue same dose of coumadin 1/2 tablet (2.5mg ) daily except 1 tablet (5mg ) on Tuesdays and Saturdays.  Recheck INR in 1 week after procedure. Call us with any medication changes or concerns # [phone] [facility], Main # [phone].     Last dose on 11/10/2018 for procedure. When you resume take an extra 1/2 tablet for 2 days then resume normal dose.

## 2018-11-01 NOTE — Telephone Encounter (Signed)
Left voicemail for patient to call back. 

## 2018-11-02 ENCOUNTER — Encounter: Payer: Self-pay | Admitting: Internal Medicine

## 2018-11-06 DIAGNOSIS — R69 Illness, unspecified: Secondary | ICD-10-CM | POA: Diagnosis not present

## 2018-11-09 ENCOUNTER — Encounter: Payer: Self-pay | Admitting: Cardiology

## 2018-11-09 ENCOUNTER — Ambulatory Visit: Payer: Medicare HMO | Admitting: Cardiology

## 2018-11-09 VITALS — BP 140/72 | HR 72 | Ht 69.0 in | Wt 231.8 lb

## 2018-11-09 DIAGNOSIS — I10 Essential (primary) hypertension: Secondary | ICD-10-CM | POA: Diagnosis not present

## 2018-11-09 DIAGNOSIS — Z7901 Long term (current) use of anticoagulants: Secondary | ICD-10-CM

## 2018-11-09 DIAGNOSIS — E119 Type 2 diabetes mellitus without complications: Secondary | ICD-10-CM | POA: Diagnosis not present

## 2018-11-09 DIAGNOSIS — I4821 Permanent atrial fibrillation: Secondary | ICD-10-CM | POA: Diagnosis not present

## 2018-11-09 DIAGNOSIS — I2510 Atherosclerotic heart disease of native coronary artery without angina pectoris: Secondary | ICD-10-CM

## 2018-11-09 NOTE — Progress Notes (Signed)
Cardiology Office Note:    Date:  11/09/2018   ID:  Marvin Foster, DOB 09/07/1934, MRN [id number]  PCP:  [name], MD  Cardiologist:  Candee Furbish, MD  Electrophysiologist:  None   Referring MD: [name], MD     History of Present Illness:    Marvin Foster is a 85 y.o. male here for follow-up of coronary artery disease with proximal LAD stent placed in 03/01/2005 with history of diabetes with hypertension and atrial fibrillation.  Goes to Coumadin clinic for atrial fibrillation anticoagulation monitoring.  Doing quite well.  Good rate control.  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  Has had trouble with eyes trying several different eyedrops.  Enjoys crop walk.  Denies any fevers chills nausea vomiting syncope bleeding.  He has not had any strokelike symptoms.  No anginal symptoms.  Past Medical History:  Diagnosis Date  . Arthritis 12/2005   left hand  . Arthritis    Left knee  . Blood transfusion without reported diagnosis   . Bursitis of knee    right knee  . CAD (coronary artery disease)   . Diabetes mellitus   . Diverticulosis   . Glaucoma    left eye  . Hyperlipidemia   . Hypertension   . Neuropathy    with pain  . Spinal stenosis 01/20/05  . Tubular adenoma of colon     Past Surgical History:  Procedure Laterality Date  . BACK SURGERY    . CATARACT EXTRACTION  08/11/09   right  . corneal endothelial Left 2013  . CORNEAL TRANSPLANT Left    eye  . CORONARY ANGIOPLASTY WITH STENT PLACEMENT    . EYE SURGERY    . SMALL INTESTINE SURGERY    . SPINE SURGERY    . VASECTOMY      Current Medications: Current Meds  Medication Sig  . aspirin EC 81 MG tablet Take by mouth.  [name] Kitchen atorvastatin (LIPITOR) 40 MG tablet TAKE 1 TABLET BY MOUTH AT BEDTIME  . Calcium Carbonate-Vitamin D (CALCIUM 600 + D PO) Take 1 tablet by mouth 2 (two) times daily.   . enalapril (VASOTEC) 5 MG tablet TAKE 1 TABLET BY MOUTH ONCE  DAILY  . fluorometholone (FML) 0.1 % ophthalmic suspension Place 1 drop into both eyes once a week.   . Lancets (ONETOUCH ULTRASOFT) lancets Use one lancet each time sugars are tested, pt tests sugars twice daily.  [name] Kitchen latanoprost (XALATAN) 0.005 % ophthalmic solution   . levothyroxine (SYNTHROID, LEVOTHROID) 50 MCG tablet TAKE 1 TABLET BY MOUTH ONCE DAILY  . metFORMIN (GLUCOPHAGE) 1000 MG tablet TAKE 1 TABLET BY MOUTH TWICE DAILY  . metoprolol tartrate (LOPRESSOR) 100 MG tablet TAKE 1 TABLET BY MOUTH TWICE DAILY  . Multiple Vitamin (MULTIVITAMIN) capsule Take 1 capsule by mouth daily.    [name] Kitchen nystatin (MYCOSTATIN/NYSTOP) powder Apply topically 3 (three) times daily.  . Omega-3 Fatty Acids (FISH OIL) 1200 MG CAPS Take 3 capsules by mouth daily.  [name] VERIO test strip USE AS DIRECTED TWICE DAILY  . SUPREP BOWEL PREP KIT 17.5-3.13-1.6 GM/177ML SOLN Take 1 kit by mouth as directed. For colonoscopy prep  . timolol (TIMOPTIC) 0.5 % ophthalmic solution Apply to eye.  . warfarin (COUMADIN) 2.5 MG tablet Take 1 tablet (2.5 mg total) by mouth as directed.     Allergies:   Neomycin-bacitracin zn-polymyx and Penicillins   Social History   Socioeconomic History  . Marital  status: Divorced    Spouse name: Not on file  . Number of children: Not on file  . Years of education: Not on file  . Highest education level: Not on file  Occupational History  . Not on file  Social Needs  . Financial resource strain: Not on file  . Food insecurity:    Worry: Not on file    Inability: Not on file  . Transportation needs:    Medical: Not on file    Non-medical: Not on file  Tobacco Use  . Smoking status: Never Smoker  . Smokeless tobacco: Never Used  Substance and Sexual Activity  . Alcohol use: Yes    Alcohol/week: 0.0 standard drinks    Comment: Very seldom---socially  . Drug use: No  . Sexual activity: Not Currently    Partners: Female  Lifestyle  . Physical activity:    Days per week: Not on  file    Minutes per session: Not on file  . Stress: Not on file  Relationships  . Social connections:    Talks on phone: Not on file    Gets together: Not on file    Attends religious service: Not on file    Active member of club or organization: Not on file    Attends meetings of clubs or organizations: Not on file    Relationship status: Not on file  Other Topics Concern  . Not on file  Social History Narrative   Volunteers at [employer] History: The patient's family history includes Heart failure in his father and mother. There is no history of Stomach cancer, Colon cancer, Pancreatic cancer, or Throat cancer.  ROS:   Please see the history of present illness.     All other systems reviewed and are negative.  EKGs/Labs/Other Studies Reviewed:    The following studies were reviewed today: Prior office notes lab work EKG  EKG:  EKG is  ordered today.  The ekg ordered today demonstrates atrial fibrillation heart rate 72 bpm with no other abnormalities personally reviewed and interpreted.  Recent Labs: 09/21/2018: ALT 21; BUN 17; Creatinine, Ser 0.95; Hemoglobin 13.4; Platelets 149.0; Potassium 5.1; Sodium 141; TSH 2.39  Recent Lipid Panel    Component Value Date/Time   CHOL 97 09/21/2018 0955   TRIG 128.0 09/21/2018 0955   HDL 30.80 (L) 09/21/2018 0955   CHOLHDL 3 09/21/2018 0955   VLDL 25.6 09/21/2018 0955   LDLCALC 41 09/21/2018 0955    Physical Exam:    VS:  BP 140/72   Pulse 72   Ht _0  (1.753 m)   Wt 231 lb 12.8 oz (105.1 kg)   SpO2 98%   BMI 34.23 kg/m     Wt Readings from Last 3 Encounters:  11/09/18 231 lb 12.8 oz (105.1 kg)  10/26/18 231 lb (104.8 kg)  09/21/18 231 lb 8 oz (105 kg)     GEN:  Well nourished, well developed in no acute distress HEENT: Normal NECK: No JVD; No carotid bruits LYMPHATICS: No lymphadenopathy CARDIAC: Irregularly irregular, normal rate, no murmurs, rubs, gallops RESPIRATORY:  Clear to  auscultation without rales, wheezing or rhonchi  ABDOMEN: Soft, non-tender, non-distended MUSCULOSKELETAL:  No edema; No deformity  SKIN: Warm and dry NEUROLOGIC:  Alert and oriented x 3 PSYCHIATRIC:  Normal affect   ASSESSMENT:    1. Permanent atrial fibrillation   2. Long term current use of anticoagulant therapy   3.  Diabetes mellitus with coincident hypertension ([place])   4. Coronary artery disease involving native coronary artery of native heart without angina pectoris    PLAN:    In order of problems listed above:  Persistent atrial fibrillation -At prior appointments atrial fibrillation was unexpectedly discovered once again.  He had been maintaining sinus rhythm for quite some time prior to this.  Of course he is on anticoagulation.  Metoprolol, well rate controlled with no specific symptoms.  He was doing quite well.  It was previously thought that a cardioversion would not hold at this point.  Continue with rate control.  Coronary artery disease -Proximal LAD stent 03/01/2005.  No exertional symptoms.  Continue with aggressive secondary risk factor prevention.  Diabetes with hypertension - Dr. Birdie Riddle.  Overall well controlled.  Medications reviewed. A1c 6.6  Chronic anticoagulation -Warfarin.  In.  Monitoring closely.  Obesity -Continue to work on weight loss.  Hyperlipidemia -Atorvastatin.  LDL in the past has been in the 40s, goal less than 70.  Excellent.  No myalgias.   Medication Adjustments/Labs and Tests Ordered: Current medicines are reviewed at length with the patient today.  Concerns regarding medicines are outlined above.  Orders Placed This Encounter  Procedures  . EKG 12-Lead   No orders of the defined types were placed in this encounter.   Patient Instructions  Medication Instructions:  The current medical regimen is effective;  continue present plan and medications.  If you need a refill on your cardiac medications before your next appointment,  please call your pharmacy.   Follow-Up: At [facility], you and your health needs are our priority.  As part of our continuing mission to provide you with exceptional heart care, we have created designated Provider Care Teams.  These Care Teams include your primary Cardiologist (physician) and Advanced Practice Providers (APPs -  Physician Assistants and Nurse Practitioners) who all work together to provide you with the care you need, when you need it. You will need a follow up appointment in 12 months.  Please call our office 2 months in advance to schedule this appointment.  You may see Candee Furbish, MD or one of the following Advanced Practice Providers on your designated Care Team:   Truitt Merle, NP Cecilie Kicks, NP . Kathyrn Drown, NP  Thank you for choosing [facility]!!        Signed, Candee Furbish, MD  11/09/2018 10:01 AM    [place]

## 2018-11-09 NOTE — Patient Instructions (Signed)
Medication Instructions:  The current medical regimen is effective;  continue present plan and medications.  If you need a refill on your cardiac medications before your next appointment, please call your pharmacy.   Follow-Up: At [facility], you and your health needs are our priority.  As part of our continuing mission to provide you with exceptional heart care, we have created designated Provider Care Teams.  These Care Teams include your primary Cardiologist (physician) and Advanced Practice Providers (APPs -  Physician Assistants and Nurse Practitioners) who all work together to provide you with the care you need, when you need it. You will need a follow up appointment in 12 months.  Please call our office 2 months in advance to schedule this appointment.  You may see Mark Skains, MD or one of the following Advanced Practice Providers on your designated Care Team:   Lori Gerhardt, NP Laura Ingold, NP . Jill McDaniel, NP  Thank you for choosing [facility]!!      

## 2018-11-16 ENCOUNTER — Ambulatory Visit (AMBULATORY_SURGERY_CENTER): Payer: Medicare HMO | Admitting: Internal Medicine

## 2018-11-16 ENCOUNTER — Encounter: Payer: Self-pay | Admitting: Internal Medicine

## 2018-11-16 VITALS — BP 134/82 | HR 82 | Temp 97.1°F | Resp 13

## 2018-11-16 DIAGNOSIS — D124 Benign neoplasm of descending colon: Secondary | ICD-10-CM | POA: Diagnosis not present

## 2018-11-16 DIAGNOSIS — D123 Benign neoplasm of transverse colon: Secondary | ICD-10-CM | POA: Diagnosis not present

## 2018-11-16 DIAGNOSIS — Z86010 Personal history of colonic polyps: Secondary | ICD-10-CM

## 2018-11-16 MED ORDER — SODIUM CHLORIDE 0.9 % IV SOLN: 500.0000 mL | Freq: Once | INTRAVENOUS | Status: DC

## 2018-11-16 NOTE — Patient Instructions (Signed)
Please read handouts provided. Continue present medications. Await pathology results. Resume Coumadin ( warfarin ) at prior dose today. Refer to managing physician for further adjustment therapy.     YOU HAD AN ENDOSCOPIC PROCEDURE TODAY AT [facility]:   Refer to the procedure report that was given to you for any specific questions about what was found during the examination.  If the procedure report does not answer your questions, please call your gastroenterologist to clarify.  If you requested that your care partner not be given the details of your procedure findings, then the procedure report has been included in a sealed envelope for you to review at your convenience later.  YOU SHOULD EXPECT: Some feelings of bloating in the abdomen. Passage of more gas than usual.  Walking can help get rid of the air that was put into your GI tract during the procedure and reduce the bloating. If you had a lower endoscopy (such as a colonoscopy or flexible sigmoidoscopy) you may notice spotting of blood in your stool or on the toilet paper. If you underwent a bowel prep for your procedure, you may not have a normal bowel movement for a few days.  Please Note:  You might notice some irritation and congestion in your nose or some drainage.  This is from the oxygen used during your procedure.  There is no need for concern and it should clear up in a day or so.  SYMPTOMS TO REPORT IMMEDIATELY:   Following lower endoscopy (colonoscopy or flexible sigmoidoscopy):  Excessive amounts of blood in the stool  Significant tenderness or worsening of abdominal pains  Swelling of the abdomen that is new, acute  Fever of 100F or higher    For urgent or emergent issues, a gastroenterologist can be reached at any hour by calling [phone].   DIET:  We do recommend a small meal at first, but then you may proceed to your regular diet.  Drink plenty of fluids but you should avoid alcoholic  beverages for 24 hours.  ACTIVITY:  You should plan to take it easy for the rest of today and you should NOT DRIVE or use heavy machinery until tomorrow (because of the sedation medicines used during the test).    FOLLOW UP: Our staff will call the number listed on your records the next business day following your procedure to check on you and address any questions or concerns that you may have regarding the information given to you following your procedure. If we do not reach you, we will leave a message.  However, if you are feeling well and you are not experiencing any problems, there is no need to return our call.  We will assume that you have returned to your regular daily activities without incident.  If any biopsies were taken you will be contacted by phone or by letter within the next 1-3 weeks.  Please call us at [phone] if you have not heard about the biopsies in 3 weeks.    SIGNATURES/CONFIDENTIALITY: You and/or your care partner have signed paperwork which will be entered into your electronic medical record.  These signatures attest to the fact that that the information above on your After Visit Summary has been reviewed and is understood.  Full responsibility of the confidentiality of this discharge information lies with you and/or your care-partner.

## 2018-11-16 NOTE — Progress Notes (Signed)
Called to room to assist during endoscopic procedure.  Patient ID and intended procedure confirmed with present staff. Received instructions for my participation in the procedure from the performing physician.  

## 2018-11-16 NOTE — Op Note (Signed)
[place] Patient Name: Cheryl Stabenow Procedure Date: 11/16/2018 3:44 PM MRN: [id number] Endoscopist: Jerene Bears , MD Age: 85 Referring MD:  Date of Birth: 11/22/1933 Gender: Male Account #: [id number] Procedure:                Colonoscopy Indications:              Surveillance: Personal history of adenomatous                            polyps on last colonoscopy 3 years ago Medicines:                Monitored Anesthesia Care Procedure:                Pre-Anesthesia Assessment:                           - Prior to the procedure, a History and Physical                            was performed, and patient medications and                            allergies were reviewed. The patient's tolerance of                            previous anesthesia was also reviewed. The risks                            and benefits of the procedure and the sedation                            options and risks were discussed with the patient.                            All questions were answered, and informed consent                            was obtained. Prior Anticoagulants: The patient has                            taken Coumadin (warfarin), last dose was 5 days                            prior to procedure. ASA Grade Assessment: III - A                            patient with severe systemic disease. After                            reviewing the risks and benefits, the patient was                            deemed in satisfactory condition to undergo the  procedure.                           After obtaining informed consent, the colonoscope                            was passed under direct vision. Throughout the                            procedure, the patient's blood pressure, pulse, and                            oxygen saturations were monitored continuously. The                            Colonoscope was introduced through the anus and             advanced to the cecum, identified by appendiceal                            orifice and ileocecal valve. The colonoscopy was                            performed without difficulty. The patient tolerated                            the procedure well. The quality of the bowel                            preparation was good. The ileocecal valve,                            appendiceal orifice, and rectum were photographed. Scope In: 3:52:46 PM Scope Out: 4:06:04 PM Scope Withdrawal Time: 0 hours 11 minutes 42 seconds  Total Procedure Duration: 0 hours 13 minutes 18 seconds  Findings:                 The digital rectal exam was normal.                           Two sessile polyps were found in the descending                            colon and transverse colon. The polyps were 3 to 4                            mm in size. These polyps were removed with a cold                            snare. Resection and retrieval were complete.                           Multiple small-mouthed diverticula were found in  the sigmoid colon and descending colon.                           Internal hemorrhoids were found during                            retroflexion. The hemorrhoids were small. Complications:            No immediate complications. Estimated Blood Loss:     Estimated blood loss was minimal. Impression:               - Two 3 to 4 mm polyps in the descending colon and                            in the transverse colon, removed with a cold snare.                            Resected and retrieved.                           - Diverticulosis in the sigmoid colon and in the                            descending colon.                           - Internal hemorrhoids. Recommendation:           - Patient has a contact number available for                            emergencies. The signs and symptoms of potential                            delayed complications were  discussed with the                            patient. Return to normal activities tomorrow.                            Written discharge instructions were provided to the                            patient.                           - Resume previous diet.                           - Continue present medications.                           - Resume Coumadin (warfarin) at prior dose today.                            Refer to managing physician for further adjustment  of therapy.                           - Await pathology results.                           - No repeat colonoscopy due to age. Jerene Bears, MD 11/16/2018 4:09:06 PM This report has been signed electronically.

## 2018-11-16 NOTE — Progress Notes (Signed)
PT taken to PACU. Monitors in place. VSS. Report given to RN. 

## 2018-11-16 NOTE — Progress Notes (Signed)
Pt's states no medical or surgical changes since previsit or office visit. 

## 2018-11-17 ENCOUNTER — Telehealth: Payer: Self-pay | Admitting: *Deleted

## 2018-11-17 ENCOUNTER — Other Ambulatory Visit: Payer: Self-pay | Admitting: Cardiology

## 2018-11-17 NOTE — Telephone Encounter (Signed)
  Follow up Call-  Call back number 11/16/2018  Post procedure Call Back phone  # [phone]  Permission to leave phone message Yes  Some recent data might be hidden     Patient questions:  Do you have a fever, pain , or abdominal swelling? No. Pain Score  0 *  Have you tolerated food without any problems? Yes.    Have you been able to return to your normal activities? Yes.    Do you have any questions about your discharge instructions: Diet   No. Medications  No. Follow up visit  No.  Do you have questions or concerns about your Care? No.  Actions: * If pain score is 4 or above: No action needed, pain <4.

## 2018-11-21 ENCOUNTER — Encounter: Payer: Self-pay | Admitting: Internal Medicine

## 2018-11-23 ENCOUNTER — Ambulatory Visit: Payer: Medicare HMO | Admitting: *Deleted

## 2018-11-23 DIAGNOSIS — Z5181 Encounter for therapeutic drug level monitoring: Secondary | ICD-10-CM

## 2018-11-23 DIAGNOSIS — I4891 Unspecified atrial fibrillation: Secondary | ICD-10-CM | POA: Diagnosis not present

## 2018-11-23 LAB — POCT INR: INR: 2.7 (ref 2.0–3.0)

## 2018-11-23 NOTE — Patient Instructions (Signed)
Description   Continue taking coumadin 1/2 tablet (2.5mg ) daily except 1 tablet on Tuesdays and Saturdays.  Recheck INR 3 weeks. Call us with any medication changes or concerns # [phone] [facility] # [phone].

## 2018-12-11 ENCOUNTER — Encounter (INDEPENDENT_AMBULATORY_CARE_PROVIDER_SITE_OTHER): Payer: Medicare HMO | Admitting: Ophthalmology

## 2018-12-11 DIAGNOSIS — H353132 Nonexudative age-related macular degeneration, bilateral, intermediate dry stage: Secondary | ICD-10-CM | POA: Diagnosis not present

## 2018-12-11 DIAGNOSIS — H35033 Hypertensive retinopathy, bilateral: Secondary | ICD-10-CM

## 2018-12-11 DIAGNOSIS — I10 Essential (primary) hypertension: Secondary | ICD-10-CM

## 2018-12-11 DIAGNOSIS — E11319 Type 2 diabetes mellitus with unspecified diabetic retinopathy without macular edema: Secondary | ICD-10-CM

## 2018-12-11 DIAGNOSIS — H43813 Vitreous degeneration, bilateral: Secondary | ICD-10-CM | POA: Diagnosis not present

## 2018-12-11 DIAGNOSIS — E113293 Type 2 diabetes mellitus with mild nonproliferative diabetic retinopathy without macular edema, bilateral: Secondary | ICD-10-CM

## 2018-12-11 LAB — HM DIABETES EYE EXAM

## 2018-12-14 ENCOUNTER — Ambulatory Visit (INDEPENDENT_AMBULATORY_CARE_PROVIDER_SITE_OTHER): Payer: Medicare HMO | Admitting: Pharmacist

## 2018-12-14 DIAGNOSIS — Z5181 Encounter for therapeutic drug level monitoring: Secondary | ICD-10-CM

## 2018-12-14 DIAGNOSIS — I4891 Unspecified atrial fibrillation: Secondary | ICD-10-CM

## 2018-12-14 LAB — POCT INR: INR: 3.7 — AB (ref 2.0–3.0)

## 2018-12-14 NOTE — Patient Instructions (Signed)
Skip Coumadin tonight, then continue taking coumadin 1/2 tablet (2.5mg ) daily except 1 tablet on Tuesdays and Saturdays.  Recheck INR 2 weeks. Call us with any medication changes or concerns # [phone] [facility] # [phone].

## 2018-12-26 ENCOUNTER — Encounter: Payer: Self-pay | Admitting: General Practice

## 2018-12-28 ENCOUNTER — Other Ambulatory Visit: Payer: Self-pay

## 2018-12-28 ENCOUNTER — Ambulatory Visit: Payer: Medicare HMO

## 2018-12-28 DIAGNOSIS — Z5181 Encounter for therapeutic drug level monitoring: Secondary | ICD-10-CM | POA: Diagnosis not present

## 2018-12-28 DIAGNOSIS — I4891 Unspecified atrial fibrillation: Secondary | ICD-10-CM

## 2018-12-28 LAB — POCT INR: INR: 3.1 — AB (ref 2.0–3.0)

## 2018-12-28 NOTE — Patient Instructions (Signed)
Description   Start taking 1/2 tablet (2.5mg ) daily except 1 tablet (5mg ) on Tuesdays.  Recheck INR 3 weeks. Call us with any medication changes or concerns # [phone] [facility] # [phone].

## 2019-01-13 DIAGNOSIS — R69 Illness, unspecified: Secondary | ICD-10-CM | POA: Diagnosis not present

## 2019-01-17 ENCOUNTER — Telehealth: Payer: Self-pay

## 2019-01-17 NOTE — Telephone Encounter (Signed)

## 2019-01-18 ENCOUNTER — Other Ambulatory Visit: Payer: Self-pay

## 2019-01-18 ENCOUNTER — Ambulatory Visit (INDEPENDENT_AMBULATORY_CARE_PROVIDER_SITE_OTHER): Payer: Medicare HMO | Admitting: Pharmacist

## 2019-01-18 DIAGNOSIS — I4891 Unspecified atrial fibrillation: Secondary | ICD-10-CM | POA: Diagnosis not present

## 2019-01-18 DIAGNOSIS — Z5181 Encounter for therapeutic drug level monitoring: Secondary | ICD-10-CM | POA: Diagnosis not present

## 2019-01-18 LAB — POCT INR: INR: 3.7 — AB (ref 2.0–3.0)

## 2019-02-01 ENCOUNTER — Other Ambulatory Visit: Payer: Self-pay | Admitting: *Deleted

## 2019-02-01 ENCOUNTER — Other Ambulatory Visit: Payer: Self-pay | Admitting: Family Medicine

## 2019-02-01 MED ORDER — ONETOUCH ULTRASOFT LANCETS MISC: 12 refills | Status: DC

## 2019-02-01 NOTE — Telephone Encounter (Signed)
Lancets have been sent in for patient.

## 2019-02-01 NOTE — Telephone Encounter (Signed)
Copied from [place] [id number]. Topic: Quick Communication - Rx Refill/Question >> Feb 01, 2019  9:48 AM Richardo Priest, NT wrote: Medication:  Lancets [name] ULTRASOFT) lancets  Has the patient contacted their pharmacy? Yes patient has run out and would like a refill on lancets  Preferred Pharmacy (with phone number or street name):  [facility], [place],  [address] Phone [phone]  Agent: Please be advised that RX refills may take up to 3 business days. We ask that you follow-up with your pharmacy.

## 2019-02-02 ENCOUNTER — Other Ambulatory Visit: Payer: Self-pay | Admitting: *Deleted

## 2019-02-02 DIAGNOSIS — R69 Illness, unspecified: Secondary | ICD-10-CM | POA: Diagnosis not present

## 2019-02-02 MED ORDER — ONETOUCH ULTRASOFT LANCETS MISC: 12 refills | Status: AC

## 2019-02-06 ENCOUNTER — Telehealth: Payer: Self-pay

## 2019-02-06 NOTE — Telephone Encounter (Signed)
lmom for prescreen  

## 2019-02-06 NOTE — Telephone Encounter (Signed)

## 2019-02-07 ENCOUNTER — Other Ambulatory Visit: Payer: Self-pay

## 2019-02-07 ENCOUNTER — Ambulatory Visit (INDEPENDENT_AMBULATORY_CARE_PROVIDER_SITE_OTHER): Payer: Medicare HMO | Admitting: *Deleted

## 2019-02-07 DIAGNOSIS — Z5181 Encounter for therapeutic drug level monitoring: Secondary | ICD-10-CM

## 2019-02-07 DIAGNOSIS — I4891 Unspecified atrial fibrillation: Secondary | ICD-10-CM

## 2019-02-07 DIAGNOSIS — I4821 Permanent atrial fibrillation: Secondary | ICD-10-CM

## 2019-02-07 LAB — POCT INR: INR: 2.3 (ref 2.0–3.0)

## 2019-02-07 NOTE — Patient Instructions (Signed)
Description   Spoke with pt and instructed to continue taking 1/2 tablet daily.  Repeat INR in 4 weeks.  Call us with any medication changes or concerns # [phone] [facility] # [phone].

## 2019-02-18 ENCOUNTER — Other Ambulatory Visit: Payer: Self-pay | Admitting: Cardiology

## 2019-02-18 ENCOUNTER — Other Ambulatory Visit: Payer: Self-pay | Admitting: Family Medicine

## 2019-02-20 ENCOUNTER — Other Ambulatory Visit: Payer: Self-pay | Admitting: Family Medicine

## 2019-03-01 DIAGNOSIS — Z961 Presence of intraocular lens: Secondary | ICD-10-CM | POA: Diagnosis not present

## 2019-03-01 DIAGNOSIS — H401112 Primary open-angle glaucoma, right eye, moderate stage: Secondary | ICD-10-CM | POA: Diagnosis not present

## 2019-03-01 DIAGNOSIS — H401123 Primary open-angle glaucoma, left eye, severe stage: Secondary | ICD-10-CM | POA: Diagnosis not present

## 2019-03-01 DIAGNOSIS — R69 Illness, unspecified: Secondary | ICD-10-CM | POA: Diagnosis not present

## 2019-03-01 DIAGNOSIS — Z947 Corneal transplant status: Secondary | ICD-10-CM | POA: Diagnosis not present

## 2019-03-04 ENCOUNTER — Other Ambulatory Visit: Payer: Self-pay | Admitting: Family Medicine

## 2019-03-06 ENCOUNTER — Telehealth: Payer: Self-pay

## 2019-03-06 NOTE — Telephone Encounter (Signed)
lmom for prescreen  

## 2019-03-09 NOTE — Telephone Encounter (Signed)
1. COVID-19 Pre-Screening Questions:  . In the past 7 to 10 days have you had a cough,  shortness of breath, headache, congestion, fever (100 or greater) body aches, chills, sore throat, or sudden loss of taste or sense of smell? no . Have you been around anyone with known Covid 19. no . Have you been around anyone who is awaiting Covid 19 test results in the past 7 to 10 days? no . Have you been around anyone who has been exposed to Covid 19, or has mentioned symptoms of Covid 19 within the past 7 to 10 days? no   2. Pt advised of visitor restrictions (no visitors allowed except if needed to conduct the visit). Also advised to arrive at appointment time and wear a mask.   3. Patient is aware that upcoming appointment has been changed to be in office    

## 2019-03-14 ENCOUNTER — Other Ambulatory Visit: Payer: Self-pay

## 2019-03-14 ENCOUNTER — Ambulatory Visit (INDEPENDENT_AMBULATORY_CARE_PROVIDER_SITE_OTHER): Payer: Medicare HMO | Admitting: Pharmacist

## 2019-03-14 DIAGNOSIS — I4891 Unspecified atrial fibrillation: Secondary | ICD-10-CM

## 2019-03-14 DIAGNOSIS — Z5181 Encounter for therapeutic drug level monitoring: Secondary | ICD-10-CM | POA: Diagnosis not present

## 2019-03-14 LAB — POCT INR: INR: 2.2 (ref 2.0–3.0)

## 2019-03-14 NOTE — Patient Instructions (Signed)
Description   Continue taking 1/2 tablet daily.  Repeat INR in 6 weeks.  Call us with any medication changes or concerns # [phone] [facility] # [phone].

## 2019-03-20 ENCOUNTER — Other Ambulatory Visit: Payer: Self-pay | Admitting: *Deleted

## 2019-03-20 DIAGNOSIS — Z20822 Contact with and (suspected) exposure to covid-19: Secondary | ICD-10-CM

## 2019-03-20 NOTE — Progress Notes (Signed)
b

## 2019-03-22 LAB — NOVEL CORONAVIRUS, NAA: SARS-CoV-2, NAA: NOT DETECTED

## 2019-04-02 ENCOUNTER — Other Ambulatory Visit: Payer: Self-pay | Admitting: Family Medicine

## 2019-04-02 DIAGNOSIS — R69 Illness, unspecified: Secondary | ICD-10-CM | POA: Diagnosis not present

## 2019-04-09 ENCOUNTER — Ambulatory Visit (INDEPENDENT_AMBULATORY_CARE_PROVIDER_SITE_OTHER): Payer: Medicare HMO | Admitting: Family Medicine

## 2019-04-09 ENCOUNTER — Other Ambulatory Visit: Payer: Self-pay

## 2019-04-09 ENCOUNTER — Encounter: Payer: Self-pay | Admitting: Family Medicine

## 2019-04-09 DIAGNOSIS — E785 Hyperlipidemia, unspecified: Secondary | ICD-10-CM

## 2019-04-09 DIAGNOSIS — E039 Hypothyroidism, unspecified: Secondary | ICD-10-CM | POA: Diagnosis not present

## 2019-04-09 DIAGNOSIS — I10 Essential (primary) hypertension: Secondary | ICD-10-CM | POA: Diagnosis not present

## 2019-04-09 DIAGNOSIS — E1139 Type 2 diabetes mellitus with other diabetic ophthalmic complication: Secondary | ICD-10-CM | POA: Diagnosis not present

## 2019-04-09 NOTE — Progress Notes (Signed)
I have discussed the procedure for the virtual visit with the patient who has given consent to proceed with assessment and treatment.   Pt unable to obtain vitals.   Basheer Molchan L Joshuajames Moehring, CMA     

## 2019-04-09 NOTE — Progress Notes (Signed)
Virtual Visit via Video   I connected with patient on 04/09/19 at  2:30 PM EDT by a video enabled telemedicine application and verified that I am speaking with the correct person using two identifiers.  Location patient: Home Location provider: Acupuncturist, Office Persons participating in the virtual visit: Patient, Provider, [place] (Jess B)  I discussed the limitations of evaluation and management by telemedicine and the availability of in person appointments. The patient expressed understanding and agreed to proceed.  Subjective:   HPI:   DM- chronic problem.  On Metformin 109m BID.  On ACE for renal protection.  UTD on foot exam.  UTD on eye exam.  Pt reports sugars are labile- but typically he is able to say why.  No symptomatic lows.  No CP, SOB, HAs, visual changes, abd pain, N/V, edema.  Hyperlipidemia- chronic problem, on Lipitor 452mQHS.  Denies abd pain, N/V.  HTN- chronic problem, on Enalapril 84m66maily, Metoprolol 100m84mD w/ hx of good control.  Pt unable to check BP today.  Currently asymptomatic.  ROS:   See pertinent positives and negatives per HPI.  Patient Active Problem List   Diagnosis Date Noted  . Hypothyroid 09/21/2018  . Hyperlipidemia 08/05/2015  . Coronary artery disease due to lipid rich plaque 08/05/2015  . Hx of colonic polyps 06/12/2015  . Chronic anticoagulation 06/12/2015  . BRBPR (bright red blood per rectum) 05/26/2015  . 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 Dermatitis of ear canal 05/07/2015  . Left knee pain 02/05/2015  . Encounter for therapeutic drug monitoring 11/06/2013  . Atrial fibrillation (HCC)[place]/14/2014  . Long term current use of anticoagulant therapy 07/24/2013  . Bilateral leg weakness 06/07/2013  . 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 Scrotal mass 03/07/2012  . General medical examination 02/28/2012  . Pain in toe 06/23/2011  . ARM, UPPER, PAIN 05/29/2010  . ANEMIA 02/18/2010  .  CELLULITIS, RIGHT KNEE 02/18/2010  . HEARING LOSS 12/22/2009  . IRREGULAR HEART RATE 11/04/2008  . ASPARTATE AMINOTRANSFERASE, SERUM, ELEVATED 11/04/2008  . LEG CRAMPS, NOCTURNAL 08/16/2008  . Nevus 05/12/2007  . Diabetes mellitus type II, controlled (HCC)[place]/10/2006  . Essential hypertension 03/27/2007  . CORONARY ARTERY DISEASE 03/27/2007    Social History   Tobacco Use  . Smoking status: Never Smoker  . Smokeless tobacco: Never Used  Substance Use Topics  . Alcohol use: Yes    Alcohol/week: 0.0 standard drinks    Comment: Very seldom---socially    Current Outpatient Medications:  .  aspirin EC 81 MG tablet, Take by mouth., Disp: , Rfl:  .  atorvastatin (LIPITOR) 40 MG tablet, TAKE 1 TABLET BY MOUTH AT BEDTIME, Disp: 90 tablet, Rfl: 0 .  Calcium Carbonate-Vitamin D (CALCIUM 600 + D PO), Take 1 tablet by mouth 2 (two) times daily. , Disp: , Rfl:  .  enalapril (VASOTEC) 5 MG tablet, TAKE 1 TABLET BY MOUTH ONCE DAILY, Disp: 90 tablet, Rfl: 3 .  fluorometholone (FML) 0.1 % ophthalmic suspension, Place 1 drop into both eyes once a week. , Disp: , Rfl:  .  Lancets (ONETOUCH ULTRASOFT) lancets, Use as instructed, Disp: 100 each, Rfl: 12 .  latanoprost (XALATAN) 0.005 % ophthalmic solution, , Disp: , Rfl:  .  levothyroxine (SYNTHROID) 50 MCG tablet, Take 1 tablet by mouth once daily, Disp: 90 tablet, Rfl: 0 .  metFORMIN (GLUCOPHAGE) 1000 MG tablet, Take 1 tablet by mouth twice daily, Disp: 180 tablet, Rfl: 0 .  metoprolol tartrate (LOPRESSOR) 100 MG tablet, Take 1 tablet by mouth twice daily, Disp: 180 tablet, Rfl: 0 .  Multiple Vitamin (MULTIVITAMIN) capsule, Take 1 capsule by mouth daily.  , Disp: , Rfl:  .  nystatin (MYCOSTATIN/NYSTOP) powder, Apply topically 3 (three) times daily., Disp: 60 g, Rfl: 3 .  Omega-3 Fatty Acids (FISH OIL) 1200 MG CAPS, Take 3 capsules by mouth daily., Disp: , Rfl:  .  ONETOUCH VERIO test strip, USE AS DIRECTED TWICE DAILY, Disp: 100 each, Rfl: 0 .   SUPREP BOWEL PREP KIT 17.5-3.13-1.6 GM/177ML SOLN, Take 1 kit by mouth as directed. For colonoscopy prep, Disp: 2 Bottle, Rfl: 0 .  timolol (TIMOPTIC) 0.5 % ophthalmic solution, Apply to eye., Disp: , Rfl:  .  warfarin (COUMADIN) 2.5 MG tablet, TAKE 1 TABLET BY MOUTH AS DIRECTED, Disp: 120 tablet, Rfl: 0  Allergies  Allergen Reactions  . Neomycin-Bacitracin Zn-Polymyx Rash  . Penicillins Other (See Comments)    Pt states this was diagnosed years ago, unsure as to reaction. Will not use.     Objective:   There were no vitals taken for this visit.  AAOx3, NAD NCAT, EOMI No obvious CN deficits Coloring WNL Pt is able to speak clearly, coherently without shortness of breath or increased work of breathing.  Thought process is linear.  Mood is appropriate.   Assessment and Plan:   HTN- chronic problem.  Hx of adequate control.  Pt was not able to check BP today.  Currently asymptomatic.  Check labs.  No anticipated med changes.  Will follow.  Hyperlipidemia- chronic problem.  Tolerating statin w/o difficulty.  Check labs.  Adjust meds prn   DM- chronic problem.  Hx of adequate control.  UTD on foot exam, eye exam.  On ACE for renal protection.  Check labs.  Adjust meds prn      Annye Asa, MD 04/09/2019

## 2019-04-17 DIAGNOSIS — R69 Illness, unspecified: Secondary | ICD-10-CM | POA: Diagnosis not present

## 2019-04-18 ENCOUNTER — Telehealth: Payer: Self-pay

## 2019-04-18 NOTE — Telephone Encounter (Signed)

## 2019-04-19 ENCOUNTER — Other Ambulatory Visit: Payer: Self-pay

## 2019-04-19 ENCOUNTER — Other Ambulatory Visit (INDEPENDENT_AMBULATORY_CARE_PROVIDER_SITE_OTHER): Payer: Medicare HMO

## 2019-04-19 DIAGNOSIS — E039 Hypothyroidism, unspecified: Secondary | ICD-10-CM | POA: Diagnosis not present

## 2019-04-19 DIAGNOSIS — I10 Essential (primary) hypertension: Secondary | ICD-10-CM

## 2019-04-19 DIAGNOSIS — E1139 Type 2 diabetes mellitus with other diabetic ophthalmic complication: Secondary | ICD-10-CM

## 2019-04-19 DIAGNOSIS — E785 Hyperlipidemia, unspecified: Secondary | ICD-10-CM | POA: Diagnosis not present

## 2019-04-19 LAB — CBC WITH DIFFERENTIAL/PLATELET
Basophils Absolute: 0.1 10*3/uL (ref 0.0–0.1)
Basophils Relative: 1.1 % (ref 0.0–3.0)
Eosinophils Absolute: 0.3 10*3/uL (ref 0.0–0.7)
Eosinophils Relative: 3.5 % (ref 0.0–5.0)
HCT: 39.3 % (ref 39.0–52.0)
Hemoglobin: 13.1 g/dL (ref 13.0–17.0)
Lymphocytes Relative: 28.9 % (ref 12.0–46.0)
Lymphs Abs: 2.3 10*3/uL (ref 0.7–4.0)
MCHC: 33.3 g/dL (ref 30.0–36.0)
MCV: 95.6 fl (ref 78.0–100.0)
Monocytes Absolute: 0.8 10*3/uL (ref 0.1–1.0)
Monocytes Relative: 10.3 % (ref 3.0–12.0)
Neutro Abs: 4.4 10*3/uL (ref 1.4–7.7)
Neutrophils Relative %: 56.2 % (ref 43.0–77.0)
Platelets: 153 10*3/uL (ref 150.0–400.0)
RBC: 4.11 Mil/uL — ABNORMAL LOW (ref 4.22–5.81)
RDW: 14.8 % (ref 11.5–15.5)
WBC: 7.9 10*3/uL (ref 4.0–10.5)

## 2019-04-19 LAB — HEPATIC FUNCTION PANEL
ALT: 21 U/L (ref 0–53)
AST: 23 U/L (ref 0–37)
Albumin: 4.2 g/dL (ref 3.5–5.2)
Alkaline Phosphatase: 48 U/L (ref 39–117)
Bilirubin, Direct: 0.2 mg/dL (ref 0.0–0.3)
Total Bilirubin: 0.9 mg/dL (ref 0.2–1.2)
Total Protein: 6.6 g/dL (ref 6.0–8.3)

## 2019-04-19 LAB — LIPID PANEL
Cholesterol: 111 mg/dL (ref 0–200)
HDL: 34.2 mg/dL — ABNORMAL LOW (ref >39.00)
LDL Cholesterol: 42 mg/dL (ref 0–99)
NonHDL: 76.34
Total CHOL/HDL Ratio: 3
Triglycerides: 174 mg/dL — ABNORMAL HIGH (ref 0.0–149.0)
VLDL: 34.8 mg/dL (ref 0.0–40.0)

## 2019-04-19 LAB — BASIC METABOLIC PANEL
BUN: 19 mg/dL (ref 6–23)
CO2: 28 mEq/L (ref 19–32)
Calcium: 8.8 mg/dL (ref 8.4–10.5)
Chloride: 102 mEq/L (ref 96–112)
Creatinine, Ser: 0.99 mg/dL (ref 0.40–1.50)
GFR: 72.13 mL/min (ref >60.00)
Glucose, Bld: 150 mg/dL — ABNORMAL HIGH (ref 70–99)
Potassium: 4.8 mEq/L (ref 3.5–5.1)
Sodium: 140 mEq/L (ref 135–145)

## 2019-04-19 LAB — HEMOGLOBIN A1C: Hgb A1c MFr Bld: 6.7 % — ABNORMAL HIGH (ref 4.6–6.5)

## 2019-04-19 LAB — TSH: TSH: 5.84 u[IU]/mL — ABNORMAL HIGH (ref 0.35–4.50)

## 2019-04-20 ENCOUNTER — Other Ambulatory Visit: Payer: Self-pay | Admitting: General Practice

## 2019-04-20 DIAGNOSIS — E039 Hypothyroidism, unspecified: Secondary | ICD-10-CM

## 2019-04-20 MED ORDER — LEVOTHYROXINE SODIUM 75 MCG PO TABS: 75.0000 ug | ORAL_TABLET | Freq: Every day | ORAL | 3 refills | Status: DC

## 2019-04-25 ENCOUNTER — Ambulatory Visit: Payer: Medicare HMO | Admitting: *Deleted

## 2019-04-25 ENCOUNTER — Other Ambulatory Visit: Payer: Self-pay

## 2019-04-25 DIAGNOSIS — I4891 Unspecified atrial fibrillation: Secondary | ICD-10-CM

## 2019-04-25 DIAGNOSIS — Z5181 Encounter for therapeutic drug level monitoring: Secondary | ICD-10-CM

## 2019-04-25 LAB — POCT INR: INR: 1.9 — AB (ref 2.0–3.0)

## 2019-04-25 NOTE — Patient Instructions (Signed)
Description   Today take 1 tablet then continue taking 1/2 tablet daily.  Repeat INR in 6 weeks.  Call us with any medication changes or concerns # [phone] [facility] # [phone].

## 2019-05-08 DIAGNOSIS — R69 Illness, unspecified: Secondary | ICD-10-CM | POA: Diagnosis not present

## 2019-05-17 ENCOUNTER — Other Ambulatory Visit: Payer: Self-pay

## 2019-05-17 ENCOUNTER — Other Ambulatory Visit (INDEPENDENT_AMBULATORY_CARE_PROVIDER_SITE_OTHER): Payer: Medicare HMO

## 2019-05-17 DIAGNOSIS — E039 Hypothyroidism, unspecified: Secondary | ICD-10-CM

## 2019-05-17 LAB — TSH: TSH: 2.49 u[IU]/mL (ref 0.35–4.50)

## 2019-05-18 ENCOUNTER — Other Ambulatory Visit: Payer: Self-pay | Admitting: Cardiology

## 2019-05-18 ENCOUNTER — Other Ambulatory Visit: Payer: Self-pay

## 2019-05-18 DIAGNOSIS — Z20822 Contact with and (suspected) exposure to covid-19: Secondary | ICD-10-CM

## 2019-05-19 LAB — NOVEL CORONAVIRUS, NAA: SARS-CoV-2, NAA: NOT DETECTED

## 2019-05-29 ENCOUNTER — Other Ambulatory Visit: Payer: Self-pay | Admitting: Family Medicine

## 2019-05-30 ENCOUNTER — Encounter: Payer: Self-pay | Admitting: Podiatry

## 2019-05-30 ENCOUNTER — Ambulatory Visit: Payer: Medicare HMO | Admitting: Podiatry

## 2019-05-30 ENCOUNTER — Other Ambulatory Visit: Payer: Self-pay

## 2019-05-30 VITALS — Temp 98.0°F

## 2019-05-30 DIAGNOSIS — E119 Type 2 diabetes mellitus without complications: Secondary | ICD-10-CM

## 2019-05-30 NOTE — Progress Notes (Signed)
This patient presents to the office for his annual foot exam.  Patient has no pain or foot pathology.   Patient is diabetic  and takes metformin.  He is also taking  Coumadin.  General Appearance  Alert, conversant and in no acute stress.  Vascular  Dorsalis pedis and posterior tibial  pulses are palpable  bilaterally.  Capillary return is within normal limits  bilaterally. Temperature is within normal limits  Bilaterally.Venous stasisw  B/L.  Neurologic  Senn-Weinstein monofilament wire test within normal limits  Right foot.  LOPS is normal/ diminished left foot.. Muscle power within normal limits bilaterally.  Nails normal nails with no evidence of fungal or bacterial infection.  Orthopedic  No limitations of motion of motion feet .  No crepitus or effusions noted.  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  Second digit overlapping right hallux.  Skin  normotropic skin with no porokeratosis noted bilaterally.  No signs of infections or ulcers noted.    Diabetic with neuropathy  ROV.  Vascular  WNL.  Neuropathy left foot.    RTC 1 year for annual foot exam.   Gardiner Barefoot DPM

## 2019-06-06 ENCOUNTER — Other Ambulatory Visit: Payer: Self-pay

## 2019-06-06 ENCOUNTER — Ambulatory Visit (INDEPENDENT_AMBULATORY_CARE_PROVIDER_SITE_OTHER): Payer: Medicare HMO | Admitting: *Deleted

## 2019-06-06 DIAGNOSIS — I4891 Unspecified atrial fibrillation: Secondary | ICD-10-CM | POA: Diagnosis not present

## 2019-06-06 DIAGNOSIS — Z5181 Encounter for therapeutic drug level monitoring: Secondary | ICD-10-CM

## 2019-06-06 LAB — POCT INR: INR: 1.8 — AB (ref 2.0–3.0)

## 2019-06-06 NOTE — Patient Instructions (Signed)
Description   Take 2 tablets tonight, then start taking 1 tablet daily (2.5mg ) except for 1.5 tablets on Wednesdays.  Repeat INR in 3 weeks.  Call us with any medication changes or concerns # [phone] [facility] # [phone].

## 2019-06-27 ENCOUNTER — Other Ambulatory Visit: Payer: Self-pay

## 2019-06-27 ENCOUNTER — Ambulatory Visit (INDEPENDENT_AMBULATORY_CARE_PROVIDER_SITE_OTHER): Payer: Medicare HMO

## 2019-06-27 DIAGNOSIS — Z5181 Encounter for therapeutic drug level monitoring: Secondary | ICD-10-CM | POA: Diagnosis not present

## 2019-06-27 DIAGNOSIS — I4891 Unspecified atrial fibrillation: Secondary | ICD-10-CM | POA: Diagnosis not present

## 2019-06-27 LAB — POCT INR: INR: 2.3 (ref 2.0–3.0)

## 2019-06-27 NOTE — Patient Instructions (Signed)
Description   Continue on same dosage 1 tablet daily except for 1.5 tablets on Wednesdays.  Repeat INR in 4 weeks.  Call us with any medication changes or concerns # [phone] [facility] # [phone].

## 2019-06-30 ENCOUNTER — Other Ambulatory Visit: Payer: Self-pay | Admitting: Family Medicine

## 2019-07-02 DIAGNOSIS — R69 Illness, unspecified: Secondary | ICD-10-CM | POA: Diagnosis not present

## 2019-07-14 DIAGNOSIS — R69 Illness, unspecified: Secondary | ICD-10-CM | POA: Diagnosis not present

## 2019-07-25 ENCOUNTER — Ambulatory Visit (INDEPENDENT_AMBULATORY_CARE_PROVIDER_SITE_OTHER): Payer: Medicare HMO | Admitting: Pharmacist

## 2019-07-25 ENCOUNTER — Other Ambulatory Visit: Payer: Self-pay

## 2019-07-25 DIAGNOSIS — I4891 Unspecified atrial fibrillation: Secondary | ICD-10-CM

## 2019-07-25 DIAGNOSIS — Z5181 Encounter for therapeutic drug level monitoring: Secondary | ICD-10-CM

## 2019-07-25 LAB — POCT INR: INR: 2.4 (ref 2.0–3.0)

## 2019-07-25 NOTE — Patient Instructions (Signed)
Continue on same dosage 1 tablet daily except for 1.5 tablets on Wednesdays.  Repeat INR in 5 weeks.  Call us with any medication changes or concerns # [phone] [facility] # [phone].

## 2019-08-01 ENCOUNTER — Encounter: Payer: Self-pay | Admitting: Family Medicine

## 2019-08-01 ENCOUNTER — Other Ambulatory Visit: Payer: Self-pay

## 2019-08-01 ENCOUNTER — Ambulatory Visit (INDEPENDENT_AMBULATORY_CARE_PROVIDER_SITE_OTHER): Payer: Medicare HMO | Admitting: Family Medicine

## 2019-08-01 VITALS — BP 118/81 | HR 78 | Temp 97.9°F | Resp 16 | Ht 69.0 in | Wt 234.2 lb

## 2019-08-01 DIAGNOSIS — E039 Hypothyroidism, unspecified: Secondary | ICD-10-CM | POA: Diagnosis not present

## 2019-08-01 DIAGNOSIS — I10 Essential (primary) hypertension: Secondary | ICD-10-CM

## 2019-08-01 DIAGNOSIS — Z0000 Encounter for general adult medical examination without abnormal findings: Secondary | ICD-10-CM | POA: Diagnosis not present

## 2019-08-01 DIAGNOSIS — I4821 Permanent atrial fibrillation: Secondary | ICD-10-CM

## 2019-08-01 DIAGNOSIS — E1139 Type 2 diabetes mellitus with other diabetic ophthalmic complication: Secondary | ICD-10-CM

## 2019-08-01 DIAGNOSIS — E785 Hyperlipidemia, unspecified: Secondary | ICD-10-CM | POA: Diagnosis not present

## 2019-08-01 DIAGNOSIS — Z125 Encounter for screening for malignant neoplasm of prostate: Secondary | ICD-10-CM | POA: Diagnosis not present

## 2019-08-01 LAB — HEPATIC FUNCTION PANEL
ALT: 26 U/L (ref 0–53)
AST: 26 U/L (ref 0–37)
Albumin: 4.2 g/dL (ref 3.5–5.2)
Alkaline Phosphatase: 53 U/L (ref 39–117)
Bilirubin, Direct: 0.3 mg/dL (ref 0.0–0.3)
Total Bilirubin: 1 mg/dL (ref 0.2–1.2)
Total Protein: 6.7 g/dL (ref 6.0–8.3)

## 2019-08-01 LAB — LIPID PANEL
Cholesterol: 106 mg/dL (ref 0–200)
HDL: 28.7 mg/dL — ABNORMAL LOW (ref >39.00)
LDL Cholesterol: 49 mg/dL (ref 0–99)
NonHDL: 77.62
Total CHOL/HDL Ratio: 4
Triglycerides: 144 mg/dL (ref 0.0–149.0)
VLDL: 28.8 mg/dL (ref 0.0–40.0)

## 2019-08-01 LAB — CBC WITH DIFFERENTIAL/PLATELET
Basophils Absolute: 0.1 10*3/uL (ref 0.0–0.1)
Basophils Relative: 1.7 % (ref 0.0–3.0)
Eosinophils Absolute: 0.3 10*3/uL (ref 0.0–0.7)
Eosinophils Relative: 5 % (ref 0.0–5.0)
HCT: 40.2 % (ref 39.0–52.0)
Hemoglobin: 13.4 g/dL (ref 13.0–17.0)
Lymphocytes Relative: 25.6 % (ref 12.0–46.0)
Lymphs Abs: 1.7 10*3/uL (ref 0.7–4.0)
MCHC: 33.3 g/dL (ref 30.0–36.0)
MCV: 94 fl (ref 78.0–100.0)
Monocytes Absolute: 0.7 10*3/uL (ref 0.1–1.0)
Monocytes Relative: 10.7 % (ref 3.0–12.0)
Neutro Abs: 3.8 10*3/uL (ref 1.4–7.7)
Neutrophils Relative %: 57 % (ref 43.0–77.0)
Platelets: 153 10*3/uL (ref 150.0–400.0)
RBC: 4.28 Mil/uL (ref 4.22–5.81)
RDW: 14.8 % (ref 11.5–15.5)
WBC: 6.6 10*3/uL (ref 4.0–10.5)

## 2019-08-01 LAB — HEMOGLOBIN A1C: Hgb A1c MFr Bld: 7 % — ABNORMAL HIGH (ref 4.6–6.5)

## 2019-08-01 LAB — BASIC METABOLIC PANEL
BUN: 17 mg/dL (ref 6–23)
CO2: 29 mEq/L (ref 19–32)
Calcium: 9.2 mg/dL (ref 8.4–10.5)
Chloride: 104 mEq/L (ref 96–112)
Creatinine, Ser: 1.02 mg/dL (ref 0.40–1.50)
GFR: 69.64 mL/min (ref >60.00)
Glucose, Bld: 130 mg/dL — ABNORMAL HIGH (ref 70–99)
Potassium: 5 mEq/L (ref 3.5–5.1)
Sodium: 141 mEq/L (ref 135–145)

## 2019-08-01 LAB — TSH: TSH: 1.12 u[IU]/mL (ref 0.35–4.50)

## 2019-08-01 LAB — PSA, MEDICARE: PSA: 2.66 ng/ml (ref 0.10–4.00)

## 2019-08-01 NOTE — Assessment & Plan Note (Signed)
Chronic problem.  Currently asymptomatic.  Check labs.  Adjust meds prn  

## 2019-08-01 NOTE — Progress Notes (Addendum)
Subjective:    Patient ID: Marvin Foster, male    DOB: 06-Feb-1934, 85 y.o.   MRN: [id number]  HPI Here today for CPE and MWV.  Risk Factors: Afib- chronic problem, following w/ Dr Marlou Porch.  On Coumadin and rate controlled w/ Metoprolol Hyperlipidemia- chronic problem, on Lipitor 40mg  daily.  Denies abd pain, N/V HTN- chronic problem, on Enalapril 5mg  daily, Metoprolol 100mg  BID w/ good control.  Denies CP, SOB, HAs, edema Hypothyroid- chronic problem, on Levothyroxine 60mcg daily.  Denies fatigue, changes to skin/hair/nails DM- chronic problem, hx of good control on Metformin 1000mg  BID.  UTD on eye exam, sees podiatry for foot exam.  Denies symptomatic lows, no numbness/tingling of hands/feet Physical Activity: limited Fall Risk: low Depression: denies current sxs Hearing: decreased to conversational tones and whispered voice, wears hearing aides ADL's: independent Cognitive: normal linear thought process, memory and attention intact Home Safety: safe at home, lives w/ wife Height, Weight, BMI, Visual Acuity: see vitals, vision corrected w/ glasses but has Fuchs' atrophy which greatly impacts vision Counseling:  UTD on colonoscopy, eye exam, immunizations, foot exam.  Due for PSA Labs Ordered: See A&P Care Plan: See A&P   Patient Care Team    Relationship Specialty Notifications Start End  [name], MD PCP - General   09/23/10   Jerline Pain, MD PCP - Cardiology Cardiology Admissions 11/01/18   Jerline Pain, MD Consulting Physician Cardiology  05/09/14   [facility]    01/26/17   [facility]    01/26/17   [facility] [place]  [facility]  01/26/17   Gardiner Barefoot, [place] Consulting Physician Podiatry  02/24/18   Lyndee Hensen, PT Physical Therapist Physical Therapy  07/20/18       Review of Systems Patient reports no vision/hearing changes, anorexia, fever ,adenopathy, persistant/recurrent hoarseness, swallowing issues, chest pain, palpitations, edema,  persistant/recurrent cough, hemoptysis, dyspnea (rest,exertional, paroxysmal nocturnal), gastrointestinal  bleeding (melena, rectal bleeding), abdominal pain, excessive heart burn, GU symptoms (dysuria, hematuria, voiding/incontinence issues) syncope, focal weakness, memory loss, numbness & tingling, skin/hair/nail changes, depression, anxiety, abnormal bruising/bleeding, musculoskeletal symptoms/signs.     Objective:   Physical Exam General Appearance:    Alert, cooperative, no distress, appears stated age, obese  Head:    Normocephalic, without obvious abnormality, atraumatic  Eyes:    PERRL, conjunctiva/corneas clear, EOM's intact, fundi    benign, both eyes       Ears:    Normal TM's and external ear canals, both ears  Nose:   Deferred due to COVID  Throat:   Neck:   Supple, symmetrical, trachea midline, no adenopathy;       thyroid:  No enlargement/tenderness/nodules  Back:     Symmetric, no curvature, ROM normal, no CVA tenderness  Lungs:     Clear to auscultation bilaterally, respirations unlabored  Chest wall:    No tenderness or deformity  Heart:    Regular rate and rhythm, S1 and S2 normal, faint SEM at USB  Abdomen:     Soft, non-tender, bowel sounds active all four quadrants,    no masses, no organomegaly  Genitalia:    Deferred at pt's request  Rectal:    Extremities:   Extremities normal, atraumatic, no cyanosis or edema  Pulses:   2+ and symmetric all extremities  Skin:   Skin color, texture, turgor normal, no rashes or lesions  Lymph nodes:   Cervical, supraclavicular, and axillary nodes normal  Neurologic:   CNII-XII intact. Normal strength, sensation and  reflexes      throughout          Assessment & Plan:

## 2019-08-01 NOTE — Assessment & Plan Note (Signed)
Pt's PE unchanged from previous.  UTD on colonoscopy, immunizations.  Check labs.  Anticipatory guidance provided.  

## 2019-08-01 NOTE — Assessment & Plan Note (Signed)
Chronic problem.  Following w/ Cardiology.  Rate controlled and on Coumadin.

## 2019-08-01 NOTE — Patient Instructions (Signed)
Follow up in 3-4 months to recheck diabetes We'll notify you of your lab results and make any changes if needed Continue to work on healthy diet and regular exercise- you can do it! Call with any questions or concerns Stay Safe!   Preventive Care 83 Years and Older, Male Preventive care refers to lifestyle choices and visits with your health care provider that can promote health and wellness. This includes:  A yearly physical exam. This is also called an annual well check.  Regular dental and eye exams.  Immunizations.  Screening for certain conditions.  Healthy lifestyle choices, such as diet and exercise. What can I expect for my preventive care visit? Physical exam Your health care provider will check:  Height and weight. These may be used to calculate body mass index (BMI), which is a measurement that tells if you are at a healthy weight.  Heart rate and blood pressure.  Your skin for abnormal spots. Counseling Your health care provider may ask you questions about:  Alcohol, tobacco, and drug use.  Emotional well-being.  Home and relationship well-being.  Sexual activity.  Eating habits.  History of falls.  Memory and ability to understand (cognition).  Work and work Statistician. What immunizations do I need?  Influenza (flu) vaccine  This is recommended every year. Tetanus, diphtheria, and pertussis (Tdap) vaccine  You may need a Td booster every 10 years. Varicella (chickenpox) vaccine  You may need this vaccine if you have not already been vaccinated. Zoster (shingles) vaccine  You may need this after age 68. Pneumococcal conjugate (PCV13) vaccine  One dose is recommended after age 44. Pneumococcal polysaccharide (PPSV23) vaccine  One dose is recommended after age 27. Measles, mumps, and rubella (MMR) vaccine  You may need at least one dose of MMR if you were born in 1957 or later. You may also need a second dose. Meningococcal conjugate  (MenACWY) vaccine  You may need this if you have certain conditions. Hepatitis A vaccine  You may need this if you have certain conditions or if you travel or work in places where you may be exposed to hepatitis A. Hepatitis B vaccine  You may need this if you have certain conditions or if you travel or work in places where you may be exposed to hepatitis B. Haemophilus influenzae type b (Hib) vaccine  You may need this if you have certain conditions. You may receive vaccines as individual doses or as more than one vaccine together in one shot (combination vaccines). Talk with your health care provider about the risks and benefits of combination vaccines. What tests do I need? Blood tests  Lipid and cholesterol levels. These may be checked every 5 years, or more frequently depending on your overall health.  Hepatitis C test.  Hepatitis B test. Screening  Lung cancer screening. You may have this screening every year starting at age 60 if you have a 30-pack-year history of smoking and currently smoke or have quit within the past 15 years.  Colorectal cancer screening. All adults should have this screening starting at age 14 and continuing until age 23. Your health care provider may recommend screening at age 68 if you are at increased risk. You will have tests every 1-10 years, depending on your results and the type of screening test.  Prostate cancer screening. Recommendations will vary depending on your family history and other risks.  Diabetes screening. This is done by checking your blood sugar (glucose) after you have not eaten for a  while (fasting). You may have this done every 1-3 years.  Abdominal aortic aneurysm (AAA) screening. You may need this if you are a current or former smoker.  Sexually transmitted disease (STD) testing. Follow these instructions at home: Eating and drinking  Eat a diet that includes fresh fruits and vegetables, whole grains, lean protein, and  low-fat dairy products. Limit your intake of foods with high amounts of sugar, saturated fats, and salt.  Take vitamin and mineral supplements as recommended by your health care provider.  Do not drink alcohol if your health care provider tells you not to drink.  If you drink alcohol: ? Limit how much you have to 0-2 drinks a day. ? Be aware of how much alcohol is in your drink. In the [place] one drink equals one 12 oz bottle of beer (355 mL), one 5 oz glass of wine (148 mL), or one 1 oz glass of hard liquor (44 mL). Lifestyle  Take daily care of your teeth and gums.  Stay active. Exercise for at least 30 minutes on 5 or more days each week.  Do not use any products that contain nicotine or tobacco, such as cigarettes, e-cigarettes, and chewing tobacco. If you need help quitting, ask your health care provider.  If you are sexually active, practice safe sex. Use a condom or other form of protection to prevent STIs (sexually transmitted infections).  Talk with your health care provider about taking a low-dose aspirin or statin. What's next?  Visit your health care provider once a year for a well check visit.  Ask your health care provider how often you should have your eyes and teeth checked.  Stay up to date on all vaccines. This information is not intended to replace advice given to you by your health care provider. Make sure you discuss any questions you have with your health care provider. Document Released: 10/24/2015 Document Revised: 09/21/2018 Document Reviewed: 09/21/2018 Elsevier Patient Education  2020 [employer].

## 2019-08-01 NOTE — Assessment & Plan Note (Signed)
Chronic problem, hx of good control.  On ACE for renal protection.  UTD on eye exam, foot exam.  Check labs.  Adjust meds prn

## 2019-08-01 NOTE — Assessment & Plan Note (Signed)
Chronic problem.  Well controlled.  Asymptomatic.  Check labs.  No anticipated med changes. 

## 2019-08-01 NOTE — Assessment & Plan Note (Signed)
Chronic problem.  Tolerating statin w/o difficulty.  Check labs.  Adjust meds prn  

## 2019-08-02 ENCOUNTER — Encounter: Payer: Self-pay | Admitting: General Practice

## 2019-08-16 ENCOUNTER — Other Ambulatory Visit: Payer: Self-pay | Admitting: Family Medicine

## 2019-08-16 ENCOUNTER — Other Ambulatory Visit: Payer: Self-pay | Admitting: Cardiology

## 2019-08-16 NOTE — Telephone Encounter (Signed)
Refill Request.  

## 2019-08-29 ENCOUNTER — Ambulatory Visit: Payer: Medicare HMO

## 2019-08-29 ENCOUNTER — Other Ambulatory Visit: Payer: Self-pay | Admitting: Family Medicine

## 2019-08-29 ENCOUNTER — Telehealth: Payer: Self-pay

## 2019-08-29 ENCOUNTER — Other Ambulatory Visit: Payer: Self-pay

## 2019-08-29 DIAGNOSIS — Z5181 Encounter for therapeutic drug level monitoring: Secondary | ICD-10-CM | POA: Diagnosis not present

## 2019-08-29 DIAGNOSIS — I4891 Unspecified atrial fibrillation: Secondary | ICD-10-CM | POA: Diagnosis not present

## 2019-08-29 LAB — POCT INR: INR: 1.8 — AB (ref 2.0–3.0)

## 2019-08-29 NOTE — Patient Instructions (Addendum)
Description   Take 2 tablets today. Call us at [phone] once you get home and ask for [facility]. We will need to see what letters or numbers are printed on the tablets you have in your new Coumadin bottle. Continue on same dosage 1 tablet daily except for 1.5 tablets on Wednesdays. Repeat INR in 5 weeks.  Call us with any medication changes or concerns # [phone] [facility], Main # [phone].

## 2019-08-29 NOTE — Telephone Encounter (Signed)
Pt called back and stated that there are no issues w/coumadin he left a msg on machine for megan. He is confused on how the white pills got there but the pt didn't state what color the coumadin pills are that he is currently taking.

## 2019-08-29 NOTE — Telephone Encounter (Signed)
Called pt back to clarify voicemail left from earlier today. He says there were only a few white tablets in his warfarin bottle. The rest were the normal green colored tablets he had been seeing previously. Pt said he removed the white tablets from the bottle and has plenty of his green warfarin tablets.

## 2019-08-30 ENCOUNTER — Other Ambulatory Visit: Payer: Self-pay | Admitting: Family Medicine

## 2019-09-13 DIAGNOSIS — Z008 Encounter for other general examination: Secondary | ICD-10-CM | POA: Diagnosis not present

## 2019-09-13 DIAGNOSIS — I10 Essential (primary) hypertension: Secondary | ICD-10-CM | POA: Diagnosis not present

## 2019-09-13 DIAGNOSIS — I2510 Atherosclerotic heart disease of native coronary artery without angina pectoris: Secondary | ICD-10-CM | POA: Diagnosis not present

## 2019-09-13 DIAGNOSIS — E039 Hypothyroidism, unspecified: Secondary | ICD-10-CM | POA: Diagnosis not present

## 2019-09-13 DIAGNOSIS — Z7984 Long term (current) use of oral hypoglycemic drugs: Secondary | ICD-10-CM | POA: Diagnosis not present

## 2019-09-13 DIAGNOSIS — E119 Type 2 diabetes mellitus without complications: Secondary | ICD-10-CM | POA: Diagnosis not present

## 2019-09-13 DIAGNOSIS — H409 Unspecified glaucoma: Secondary | ICD-10-CM | POA: Diagnosis not present

## 2019-09-13 DIAGNOSIS — E785 Hyperlipidemia, unspecified: Secondary | ICD-10-CM | POA: Diagnosis not present

## 2019-09-13 DIAGNOSIS — E669 Obesity, unspecified: Secondary | ICD-10-CM | POA: Diagnosis not present

## 2019-09-13 DIAGNOSIS — Z6834 Body mass index (BMI) 34.0-34.9, adult: Secondary | ICD-10-CM | POA: Diagnosis not present

## 2019-09-13 DIAGNOSIS — Z7901 Long term (current) use of anticoagulants: Secondary | ICD-10-CM | POA: Diagnosis not present

## 2019-09-15 ENCOUNTER — Other Ambulatory Visit: Payer: Self-pay | Admitting: Family Medicine

## 2019-09-27 ENCOUNTER — Other Ambulatory Visit: Payer: Self-pay | Admitting: Family Medicine

## 2019-09-27 DIAGNOSIS — R69 Illness, unspecified: Secondary | ICD-10-CM | POA: Diagnosis not present

## 2019-10-03 ENCOUNTER — Ambulatory Visit: Payer: Medicare HMO | Admitting: *Deleted

## 2019-10-03 ENCOUNTER — Other Ambulatory Visit: Payer: Self-pay

## 2019-10-03 DIAGNOSIS — I4891 Unspecified atrial fibrillation: Secondary | ICD-10-CM | POA: Diagnosis not present

## 2019-10-03 DIAGNOSIS — Z5181 Encounter for therapeutic drug level monitoring: Secondary | ICD-10-CM | POA: Diagnosis not present

## 2019-10-03 LAB — POCT INR: INR: 2.9 (ref 2.0–3.0)

## 2019-10-03 NOTE — Patient Instructions (Signed)
Description   Continue on same dosage 1 tablet daily except for 1.5 tablets on Wednesdays. Repeat INR in 6 weeks.  Call us with any medication changes or concerns # [phone] [facility], Main # [phone].

## 2019-10-15 ENCOUNTER — Other Ambulatory Visit: Payer: Self-pay | Admitting: Family Medicine

## 2019-10-22 DIAGNOSIS — R69 Illness, unspecified: Secondary | ICD-10-CM | POA: Diagnosis not present

## 2019-10-26 DIAGNOSIS — M25562 Pain in left knee: Secondary | ICD-10-CM | POA: Diagnosis not present

## 2019-10-26 DIAGNOSIS — M13862 Other specified arthritis, left knee: Secondary | ICD-10-CM | POA: Diagnosis not present

## 2019-11-01 DIAGNOSIS — H401123 Primary open-angle glaucoma, left eye, severe stage: Secondary | ICD-10-CM | POA: Diagnosis not present

## 2019-11-01 DIAGNOSIS — Z961 Presence of intraocular lens: Secondary | ICD-10-CM | POA: Diagnosis not present

## 2019-11-01 DIAGNOSIS — Z947 Corneal transplant status: Secondary | ICD-10-CM | POA: Diagnosis not present

## 2019-11-01 DIAGNOSIS — H401112 Primary open-angle glaucoma, right eye, moderate stage: Secondary | ICD-10-CM | POA: Diagnosis not present

## 2019-11-11 DIAGNOSIS — R69 Illness, unspecified: Secondary | ICD-10-CM | POA: Diagnosis not present

## 2019-11-12 ENCOUNTER — Other Ambulatory Visit: Payer: Self-pay

## 2019-11-12 ENCOUNTER — Ambulatory Visit (INDEPENDENT_AMBULATORY_CARE_PROVIDER_SITE_OTHER): Payer: Medicare HMO | Admitting: *Deleted

## 2019-11-12 ENCOUNTER — Ambulatory Visit: Payer: Medicare HMO | Admitting: Cardiology

## 2019-11-12 ENCOUNTER — Encounter: Payer: Self-pay | Admitting: Cardiology

## 2019-11-12 VITALS — BP 124/80 | HR 65 | Ht 69.0 in | Wt 233.2 lb

## 2019-11-12 DIAGNOSIS — I2510 Atherosclerotic heart disease of native coronary artery without angina pectoris: Secondary | ICD-10-CM | POA: Diagnosis not present

## 2019-11-12 DIAGNOSIS — I4821 Permanent atrial fibrillation: Secondary | ICD-10-CM | POA: Diagnosis not present

## 2019-11-12 DIAGNOSIS — I4891 Unspecified atrial fibrillation: Secondary | ICD-10-CM | POA: Diagnosis not present

## 2019-11-12 DIAGNOSIS — Z5181 Encounter for therapeutic drug level monitoring: Secondary | ICD-10-CM | POA: Diagnosis not present

## 2019-11-12 DIAGNOSIS — E119 Type 2 diabetes mellitus without complications: Secondary | ICD-10-CM | POA: Diagnosis not present

## 2019-11-12 DIAGNOSIS — I10 Essential (primary) hypertension: Secondary | ICD-10-CM

## 2019-11-12 LAB — POCT INR: INR: 1.5 — AB (ref 2.0–3.0)

## 2019-11-12 NOTE — Patient Instructions (Signed)
Description   Take 1.5 tablet today and tomorrow and then continue on same dosage 1 tablet daily except for 1.5 tablets on Wednesdays. Repeat INR in 2 weeks.  Call us with any medication changes or concerns # [phone] [facility], Main # [phone].

## 2019-11-12 NOTE — Patient Instructions (Signed)
Medication Instructions:  1) Stop Aspirin   *If you need a refill on your cardiac medications before your next appointment, please call your pharmacy*  Lab Work: None ordered   If you have labs (blood work) drawn today and your tests are completely normal, you will receive your results only by: [name] Kitchen MyChart Message (if you have MyChart) OR . A paper copy in the mail If you have any lab test that is abnormal or we need to change your treatment, we will call you to review the results.  Testing/Procedures: None ordered   Follow-Up: At [facility], you and your health needs are our priority.  As part of our continuing mission to provide you with exceptional heart care, we have created designated Provider Care Teams.  These Care Teams include your primary Cardiologist (physician) and Advanced Practice Providers (APPs -  Physician Assistants and Nurse Practitioners) who all work together to provide you with the care you need, when you need it.  Your next appointment:   1 year(s)  The format for your next appointment:   In Person  Provider:   You may see Candee Furbish, MD or one of the following Advanced Practice Providers on your designated Care Team:    Truitt Merle, NP  Cecilie Kicks, NP  Kathyrn Drown, NP   Other Instructions None

## 2019-11-12 NOTE — Progress Notes (Signed)
Cardiology Office Note:    Date:  11/12/2019   ID:  Marvin Foster, DOB 27-May-1934, MRN [id number]  PCP:  [name], MD  Cardiologist:  Candee Furbish, MD  Electrophysiologist:  None   Referring MD: [name], MD     History of Present Illness:    Marvin Foster is a 86 y.o. male here for follow-up of CAD LAD stent hypertension permanent atrial fibrillation.    Proximal LAD stent placed in 03/01/2005 with history of diabetes with hypertension and atrial fibrillation.  Goes to Coumadin clinic for atrial fibrillation anticoagulation monitoring.   Good rate control.  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  Has had trouble with eyes trying several different eyedrops.  Enjoys crop walk.  Denies any fevers chills nausea vomiting syncope bleeding.  He has not had any strokelike symptoms.  No anginal symptoms.  Past Medical History:  Diagnosis Date  . Arthritis 12/2005   left hand  . Arthritis    Left knee  . Blood transfusion without reported diagnosis   . Bursitis of knee    right knee  . CAD (coronary artery disease)   . Diabetes mellitus   . Diverticulosis   . Glaucoma    left eye  . Hyperlipidemia   . Hypertension   . Neuropathy    with pain  . Spinal stenosis 01/20/05  . Tubular adenoma of colon     Past Surgical History:  Procedure Laterality Date  . BACK SURGERY    . CATARACT EXTRACTION  08/11/09   right  . corneal endothelial Left 2013  . CORNEAL TRANSPLANT Left    eye  . CORONARY ANGIOPLASTY WITH STENT PLACEMENT    . EYE SURGERY    . SMALL INTESTINE SURGERY    . SPINE SURGERY    . VASECTOMY      Current Medications: Current Meds  Medication Sig  . atorvastatin (LIPITOR) 40 MG tablet TAKE 1 TABLET BY MOUTH AT BEDTIME  . Calcium Carbonate-Vitamin D (CALCIUM 600 + D PO) Take 1 tablet by mouth 2 (two) times daily.   . enalapril (VASOTEC) 5 MG tablet TAKE 1 TABLET BY MOUTH ONCE DAILY  . EUTHYROX 75 MCG  tablet Take 1 tablet by mouth once daily  . fluorometholone (FML) 0.1 % ophthalmic suspension Place 1 drop into both eyes once a week.   . Lancets (ONETOUCH ULTRASOFT) lancets Use as instructed  . latanoprost (XALATAN) 0.005 % ophthalmic solution   . metFORMIN (GLUCOPHAGE) 1000 MG tablet Take 1 tablet by mouth twice daily  . metoprolol tartrate (LOPRESSOR) 100 MG tablet Take 1 tablet by mouth twice daily  . Multiple Vitamin (MULTIVITAMIN) capsule Take 1 capsule by mouth daily.    [name] Kitchen nystatin (MYCOSTATIN/NYSTOP) powder Apply topically 3 (three) times daily.  . Omega-3 Fatty Acids (FISH OIL) 1200 MG CAPS Take 3 capsules by mouth daily.  [name] VERIO test strip USE 1 STRIP TO CHECK GLUCOSE TWICE DAILY AS DIRECTED  . SUPREP BOWEL PREP KIT 17.5-3.13-1.6 GM/177ML SOLN Take 1 kit by mouth as directed. For colonoscopy prep  . timolol (TIMOPTIC) 0.5 % ophthalmic solution Apply to eye.  . warfarin (COUMADIN) 2.5 MG tablet TAKE AS  DIRECTED BY COUMADIN  CLINIC  . [DISCONTINUED] aspirin EC 81 MG tablet Take 81 mg by mouth.      Allergies:   Neomycin-bacitracin zn-polymyx and Penicillins   Social History   Socioeconomic History  . Marital status: Divorced  Spouse name: Not on file  . Number of children: Not on file  . Years of education: Not on file  . Highest education level: Not on file  Occupational History  . Not on file  Tobacco Use  . Smoking status: Never Smoker  . Smokeless tobacco: Never Used  Substance and Sexual Activity  . Alcohol use: Yes    Alcohol/week: 0.0 standard drinks    Comment: Very seldom---socially  . Drug use: No  . Sexual activity: Not Currently    Partners: Female  Other Topics Concern  . Not on file  Social History Narrative   Volunteers at [place] Determinants of Health   Financial Resource Strain:   . Difficulty of Paying Living Expenses: Not on file  Food Insecurity:   . Worried About Charity fundraiser in the Last Year:  Not on file  . Ran Out of Food in the Last Year: Not on file  Transportation Needs:   . Lack of Transportation (Medical): Not on file  . Lack of Transportation (Non-Medical): Not on file  Physical Activity:   . Days of Exercise per Week: Not on file  . Minutes of Exercise per Session: Not on file  Stress:   . Feeling of Stress : Not on file  Social Connections:   . Frequency of Communication with Friends and Family: Not on file  . Frequency of Social Gatherings with Friends and Family: Not on file  . Attends Religious Services: Not on file  . Active Member of Clubs or Organizations: Not on file  . Attends Archivist Meetings: Not on file  . Marital Status: Not on file     Family History: The patient's family history includes Heart failure in his father and mother. There is no history of Stomach cancer, Colon cancer, Pancreatic cancer, or Throat cancer.  ROS:   Please see the history of present illness.     All other systems reviewed and are negative.  EKGs/Labs/Other Studies Reviewed:    The following studies were reviewed today: Prior office notes lab work EKG  Cardiac catheterization 03/01/05:  1.  Atherosclerotic cardiovascular disease, single vessel.  2.  Status post successful PCI/bare metal stent implantation in proximal      LAD.  3.  Typical angina was reproduced with device insertion and balloon      inflation.  EKG: EKG 11/12/2019 shows atrial fibrillation 65 occasional PVCs which are right bundle branch block and configuration.  Atrial fibrillation heart rate 72 bpm with no other abnormalities personally reviewed and interpreted.  Recent Labs: 08/01/2019: ALT 26; BUN 17; Creatinine, Ser 1.02; Hemoglobin 13.4; Platelets 153.0; Potassium 5.0; Sodium 141; TSH 1.12  Recent Lipid Panel    Component Value Date/Time   CHOL 106 08/01/2019 0934   TRIG 144.0 08/01/2019 0934   HDL 28.70 (L) 08/01/2019 0934   CHOLHDL 4 08/01/2019 0934   VLDL 28.8 08/01/2019 0934    LDLCALC 49 08/01/2019 0934    Physical Exam:    VS:  BP 124/80   Pulse 65   Ht _0  (1.753 m)   Wt 233 lb 3.2 oz (105.8 kg)   BMI 34.44 kg/m     Wt Readings from Last 3 Encounters:  11/12/19 233 lb 3.2 oz (105.8 kg)  08/01/19 234 lb 4 oz (106.3 kg)  11/09/18 231 lb 12.8 oz (105.1 kg)     GEN: Well nourished, well developed, in no acute  distress  HEENT: normal  Neck: no JVD, carotid bruits, or masses Cardiac: IRRR; no murmurs, rubs, or gallops,no edema  Respiratory:  clear to auscultation bilaterally, normal work of breathing GI: soft, nontender, nondistended, + BS MS: no deformity or atrophy  Skin: warm and dry, no rash Neuro:  Alert and Oriented x 3, Strength and sensation are intact Psych: euthymic mood, full affect   ASSESSMENT:    1. Permanent atrial fibrillation ([place])   2. Essential hypertension   3. Diabetes mellitus with coincident hypertension ([place])   4. Coronary artery disease involving native coronary artery of native heart without angina pectoris    PLAN:    In order of problems listed above:  Persistent atrial fibrillation -At prior appointments atrial fibrillation was unexpectedly discovered once again.  He had been maintaining sinus rhythm for quite some time prior to this.  Of course he is on anticoagulation with warfarin maintained in our clinic.  Metoprolol, well rate controlled with no specific symptoms.   It was previously thought that a cardioversion would not hold at this point given the longevity of atrial fibrillation.  Continue with rate control.  Coronary artery disease -Proximal bare-metal LAD stent 03/01/2005.  No exertional symptoms.  Continue with aggressive secondary risk factor prevention.  Continue with aspirin atorvastatin ACE inhibitor beta-blocker and good overall diabetes control. Will stop ASA given that he is on full anticoagulation with Coumadin and his coronary disease has been stable, prior bare-metal stent.  I have discussed  this with one of my interventional colleagues who agrees with current management.  Diabetes with hypertension - Dr. Birdie Riddle.  Overall well controlled.  Medications reviewed. A1c 7.0 from outside labs in October 2020.  Creatinine 1.02  Chronic anticoagulation -Warfarin.  In.  Monitoring closely.  No evidence of high risk bleeding.  Prior hemoglobin 13.4.  Obesity -Continue to work on weight loss.  Hyperlipidemia -Atorvastatin.  LDL in the past has been in the 40s, goal less than 70.  Most recently LDL 49 from outside labs in October 2020, ALT 26 excellent.  Excellent.  No myalgias.   Medication Adjustments/Labs and Tests Ordered: Current medicines are reviewed at length with the patient today.  Concerns regarding medicines are outlined above.  Orders Placed This Encounter  Procedures  . EKG 12-Lead   No orders of the defined types were placed in this encounter.   Patient Instructions  Medication Instructions:  1) Stop Aspirin   *If you need a refill on your cardiac medications before your next appointment, please call your pharmacy*  Lab Work: None ordered   If you have labs (blood work) drawn today and your tests are completely normal, you will receive your results only by: [name] Kitchen MyChart Message (if you have MyChart) OR . A paper copy in the mail If you have any lab test that is abnormal or we need to change your treatment, we will call you to review the results.  Testing/Procedures: None ordered   Follow-Up: At [facility], you and your health needs are our priority.  As part of our continuing mission to provide you with exceptional heart care, we have created designated Provider Care Teams.  These Care Teams include your primary Cardiologist (physician) and Advanced Practice Providers (APPs -  Physician Assistants and Nurse Practitioners) who all work together to provide you with the care you need, when you need it.  Your next appointment:   1 year(s)  The format for  your next appointment:   In Person  Provider:   You may see Candee Furbish, MD or one of the following Advanced Practice Providers on your designated Care Team:    Truitt Merle, NP  Cecilie Kicks, NP  Kathyrn Drown, NP   Other Instructions None      Signed, Candee Furbish, MD  11/12/2019 9:34 AM    [place]

## 2019-11-15 ENCOUNTER — Other Ambulatory Visit: Payer: Self-pay | Admitting: Family Medicine

## 2019-11-21 ENCOUNTER — Other Ambulatory Visit: Payer: Self-pay | Admitting: Cardiology

## 2019-11-21 MED ORDER — ENALAPRIL MALEATE 5 MG PO TABS: 5.0000 mg | ORAL_TABLET | Freq: Every day | ORAL | 3 refills | Status: DC

## 2019-11-26 ENCOUNTER — Other Ambulatory Visit: Payer: Self-pay

## 2019-11-26 ENCOUNTER — Ambulatory Visit (INDEPENDENT_AMBULATORY_CARE_PROVIDER_SITE_OTHER): Payer: Medicare HMO

## 2019-11-26 DIAGNOSIS — Z5181 Encounter for therapeutic drug level monitoring: Secondary | ICD-10-CM

## 2019-11-26 DIAGNOSIS — I4891 Unspecified atrial fibrillation: Secondary | ICD-10-CM | POA: Diagnosis not present

## 2019-11-26 LAB — POCT INR: INR: 2.3 (ref 2.0–3.0)

## 2019-11-26 NOTE — Patient Instructions (Signed)
Description   Continue on same dosage 1 tablet daily except for 1.5 tablets on Wednesdays. Repeat INR in 4 weeks.  Call us with any medication changes or concerns # [phone] Coumadin Clinic, Main # [phone].

## 2019-11-27 ENCOUNTER — Telehealth: Payer: Self-pay | Admitting: Cardiology

## 2019-11-27 NOTE — Telephone Encounter (Signed)
Patient calling stating he needs a letter saying it is okay for him to get his covid vaccine since he is on blood thinners. He says his vaccine appointment is on 2/19 so he would need the letter before then.

## 2019-11-27 NOTE — Telephone Encounter (Signed)
Letter written. The patient will come pick up today.

## 2019-12-03 LAB — HM DIABETES EYE EXAM

## 2019-12-05 ENCOUNTER — Other Ambulatory Visit: Payer: Self-pay

## 2019-12-05 ENCOUNTER — Other Ambulatory Visit: Payer: Self-pay | Admitting: Family Medicine

## 2019-12-05 ENCOUNTER — Encounter: Payer: Self-pay | Admitting: Family Medicine

## 2019-12-05 ENCOUNTER — Ambulatory Visit (INDEPENDENT_AMBULATORY_CARE_PROVIDER_SITE_OTHER): Payer: Medicare HMO | Admitting: Family Medicine

## 2019-12-05 DIAGNOSIS — Z20822 Contact with and (suspected) exposure to covid-19: Secondary | ICD-10-CM | POA: Diagnosis not present

## 2019-12-05 DIAGNOSIS — E1139 Type 2 diabetes mellitus with other diabetic ophthalmic complication: Secondary | ICD-10-CM

## 2019-12-05 NOTE — Progress Notes (Signed)
Virtual Visit via Video   I connected with patient on 12/05/19 at  9:30 AM EST by a video enabled telemedicine application and verified that I am speaking with the correct person using two identifiers.  Location patient: Home Location provider: Acupuncturist, Office Persons participating in the virtual visit: Patient, Provider, Barron (Jess B)  I discussed the limitations of evaluation and management by telemedicine and the availability of in person appointments. The patient expressed understanding and agreed to proceed.  Subjective:   HPI:   DM- chronic problem, on Metformin 1059m BID.  On ACE for renal protection.  UTD on foot exam, eye exam.  Home CBGs are 'in the 130 range consistently'.  No CP, SOB, HAs, visual changes, abd pain, N/V.  Denies numbness/tingling above baseline.  Denies symptomatic lows.  COVID exposure- pt's 2nd oldest son was admitted to hospital yesterday.  Pt had contact w/ pt ~17 days ago.  Remains asymptomatic.  Had 1st vaccine yesterday.  Pt reports no current side effects from the vaccine.  Has good family support.  ROS:   See pertinent positives and negatives per HPI.  Patient Active Problem List   Diagnosis Date Noted  . Hypothyroid 09/21/2018  . Hyperlipidemia 08/05/2015  . Coronary artery disease due to lipid rich plaque 08/05/2015  . Hx of colonic polyps 06/12/2015  . Chronic anticoagulation 06/12/2015  . 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 Dermatitis of ear canal 05/07/2015  . Encounter for therapeutic drug monitoring 11/06/2013  . Atrial fibrillation (HFlippin 07/24/2013  . Long term current use of anticoagulant therapy 07/24/2013  . Bilateral leg weakness 06/07/2013  . 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 Scrotal mass 03/07/2012  . General medical examination 02/28/2012  . Pain in toe 06/23/2011  . ANEMIA 02/18/2010  . CELLULITIS, RIGHT KNEE 02/18/2010  . HEARING LOSS 12/22/2009  . ASPARTATE  AMINOTRANSFERASE, SERUM, ELEVATED 11/04/2008  . LEG CRAMPS, NOCTURNAL 08/16/2008  . Nevus 05/12/2007  . Diabetes mellitus type II, controlled ([place] 05/12/2007  . Essential hypertension 03/27/2007  . CORONARY ARTERY DISEASE 03/27/2007    Social History   Tobacco Use  . Smoking status: Never Smoker  . Smokeless tobacco: Never Used  Substance Use Topics  . Alcohol use: Yes    Alcohol/week: 0.0 standard drinks    Comment: Very seldom---socially    Current Outpatient Medications:  .  atorvastatin (LIPITOR) 40 MG tablet, TAKE 1 TABLET BY MOUTH AT BEDTIME, Disp: 90 tablet, Rfl: 0 .  Calcium Carbonate-Vitamin D (CALCIUM 600 + D PO), Take 1 tablet by mouth 2 (two) times daily. , Disp: , Rfl:  .  enalapril (VASOTEC) 5 MG tablet, Take 1 tablet (5 mg total) by mouth daily., Disp: 90 tablet, Rfl: 3 .  EUTHYROX 75 MCG tablet, Take 1 tablet by mouth once daily, Disp: 90 tablet, Rfl: 1 .  fluorometholone (FML) 0.1 % ophthalmic suspension, Place 1 drop into both eyes once a week. , Disp: , Rfl:  .  Lancets (ONETOUCH ULTRASOFT) lancets, Use as instructed, Disp: 100 each, Rfl: 12 .  latanoprost (XALATAN) 0.005 % ophthalmic solution, , Disp: , Rfl:  .  metFORMIN (GLUCOPHAGE) 1000 MG tablet, Take 1 tablet by mouth twice daily, Disp: 180 tablet, Rfl: 0 .  metoprolol tartrate (LOPRESSOR) 100 MG tablet, Take 1 tablet by mouth twice daily, Disp: 180 tablet, Rfl: 0 .  Multiple Vitamin (MULTIVITAMIN) capsule, Take 1 capsule by mouth daily.  , Disp: , Rfl:  .  nystatin (MYCOSTATIN/NYSTOP)  powder, Apply topically 3 (three) times daily., Disp: 60 g, Rfl: 3 .  Omega-3 Fatty Acids (FISH OIL) 1200 MG CAPS, Take 3 capsules by mouth daily., Disp: , Rfl:  .  ONETOUCH VERIO test strip, USE 1 STRIP TO CHECK GLUCOSE TWICE DAILY AS DIRECTED, Disp: 100 each, Rfl: 12 .  SF 5000 PLUS 1.1 % CREA dental cream, APPLY AT LEAST A 1 INCH STRIP ONTO A SOFT BRISTLE TOOTHBRUSH AND BRUSH TEETH THOROUGHLY FOR AT LEAST 2 MINUTES, Disp: ,  Rfl:  .  SUPREP BOWEL PREP KIT 17.5-3.13-1.6 GM/177ML SOLN, Take 1 kit by mouth as directed. For colonoscopy prep, Disp: 2 Bottle, Rfl: 0 .  timolol (TIMOPTIC) 0.5 % ophthalmic solution, Apply to eye., Disp: , Rfl:  .  warfarin (COUMADIN) 2.5 MG tablet, TAKE AS  DIRECTED BY COUMADIN  CLINIC, Disp: 100 tablet, Rfl: 1  Allergies  Allergen Reactions  . Neomycin-Bacitracin Zn-Polymyx Rash  . Penicillins Other (See Comments)    Pt states this was diagnosed years ago, unsure as to reaction. Will not use.     Objective:   There were no vitals taken for this visit.  Pt is able to speak clearly, coherently without shortness of breath or increased work of breathing. Thought process is linear.  Mood is appropriate.   Assessment and Plan:   DM- chronic problem.  Currently well controlled.  Asymptomatic.  UTD on foot exam, eye exam.  On ACE for renal protection.  Check labs.  Adjust meds prn   COVID exposure- pt was exposed ~17 days ago.  Son is now admitted w/ COVID PNA.  Pt is currently asymptomatic but we reviewed symptoms to watch for and he is to notify me if sxs develop.  Pt expressed understanding and is in agreement w/ plan.    Annye Asa, MD 12/05/2019  Time spent with the patient: 13 minutes, of which >50% was spent in obtaining information about symptoms, reviewing previous labs, evaluations, and treatments, counseling about condition (please see the discussed topics above), and developing a plan to further investigate it; had a number of questions which I addressed.

## 2019-12-05 NOTE — Progress Notes (Signed)
I have discussed the procedure for the virtual visit with the patient who has given consent to proceed with assessment and treatment.   Pt unable to obtain vitals.   Keli Buehner L Dreyton Roessner, CMA     

## 2019-12-06 ENCOUNTER — Other Ambulatory Visit: Payer: Self-pay | Admitting: Family Medicine

## 2019-12-08 ENCOUNTER — Other Ambulatory Visit: Payer: Self-pay | Admitting: Family Medicine

## 2019-12-10 ENCOUNTER — Encounter (INDEPENDENT_AMBULATORY_CARE_PROVIDER_SITE_OTHER): Payer: Medicare HMO | Admitting: Ophthalmology

## 2019-12-10 ENCOUNTER — Other Ambulatory Visit: Payer: Self-pay

## 2019-12-10 DIAGNOSIS — E113293 Type 2 diabetes mellitus with mild nonproliferative diabetic retinopathy without macular edema, bilateral: Secondary | ICD-10-CM

## 2019-12-10 DIAGNOSIS — H353221 Exudative age-related macular degeneration, left eye, with active choroidal neovascularization: Secondary | ICD-10-CM | POA: Diagnosis not present

## 2019-12-10 DIAGNOSIS — E11319 Type 2 diabetes mellitus with unspecified diabetic retinopathy without macular edema: Secondary | ICD-10-CM | POA: Diagnosis not present

## 2019-12-10 DIAGNOSIS — H35033 Hypertensive retinopathy, bilateral: Secondary | ICD-10-CM

## 2019-12-10 DIAGNOSIS — H43813 Vitreous degeneration, bilateral: Secondary | ICD-10-CM | POA: Diagnosis not present

## 2019-12-10 DIAGNOSIS — H35371 Puckering of macula, right eye: Secondary | ICD-10-CM | POA: Diagnosis not present

## 2019-12-10 DIAGNOSIS — H353112 Nonexudative age-related macular degeneration, right eye, intermediate dry stage: Secondary | ICD-10-CM

## 2019-12-10 DIAGNOSIS — I10 Essential (primary) hypertension: Secondary | ICD-10-CM

## 2019-12-10 LAB — HM DIABETES EYE EXAM

## 2019-12-12 ENCOUNTER — Telehealth: Payer: Self-pay | Admitting: *Deleted

## 2019-12-12 DIAGNOSIS — E1139 Type 2 diabetes mellitus with other diabetic ophthalmic complication: Secondary | ICD-10-CM

## 2019-12-12 NOTE — Telephone Encounter (Signed)
Not a problem. Placed these orders today.

## 2019-12-12 NOTE — Telephone Encounter (Signed)
Thank you Marvin Foster! 

## 2019-12-12 NOTE — Telephone Encounter (Signed)
Dr Birdie Riddle -- pt has lab appointment scheduled at [facility] point office tomorrow morning but I do not see any future orders in Epic. Can you place appropriate orders?

## 2019-12-12 NOTE — Telephone Encounter (Signed)
Please advise 

## 2019-12-12 NOTE — Telephone Encounter (Signed)
Whoops!  BMP and A1C for DM

## 2019-12-13 ENCOUNTER — Telehealth: Payer: Self-pay | Admitting: Family Medicine

## 2019-12-13 ENCOUNTER — Other Ambulatory Visit: Payer: Self-pay

## 2019-12-13 ENCOUNTER — Other Ambulatory Visit (INDEPENDENT_AMBULATORY_CARE_PROVIDER_SITE_OTHER): Payer: Medicare HMO

## 2019-12-13 DIAGNOSIS — E1139 Type 2 diabetes mellitus with other diabetic ophthalmic complication: Secondary | ICD-10-CM | POA: Diagnosis not present

## 2019-12-13 LAB — BASIC METABOLIC PANEL
BUN: 20 mg/dL (ref 6–23)
CO2: 28 mEq/L (ref 19–32)
Calcium: 8.8 mg/dL (ref 8.4–10.5)
Chloride: 102 mEq/L (ref 96–112)
Creatinine, Ser: 0.96 mg/dL (ref 0.40–1.50)
GFR: 74.62 mL/min (ref >60.00)
Glucose, Bld: 176 mg/dL — ABNORMAL HIGH (ref 70–99)
Potassium: 4.8 mEq/L (ref 3.5–5.1)
Sodium: 139 mEq/L (ref 135–145)

## 2019-12-13 LAB — HEMOGLOBIN A1C: Hgb A1c MFr Bld: 7 % — ABNORMAL HIGH (ref 4.6–6.5)

## 2019-12-13 NOTE — Telephone Encounter (Signed)
Ok for pt to come into office at next available unless he would like to see another provider sooner

## 2019-12-13 NOTE — Telephone Encounter (Signed)
Pt has been schedule.

## 2019-12-13 NOTE — Telephone Encounter (Signed)
Pt called in stating that he is having some hand pain in his thumb and four finger. He states that it painful to move it. No numbness or trouble holding things. I offered pt a doxy appt but he states that he would like to come in office to have Dr. Birdie Riddle evaluate it. Please advise

## 2019-12-13 NOTE — Telephone Encounter (Signed)
Please advise 

## 2019-12-13 NOTE — Telephone Encounter (Signed)
fyi

## 2019-12-14 ENCOUNTER — Other Ambulatory Visit: Payer: Self-pay

## 2019-12-14 ENCOUNTER — Ambulatory Visit (INDEPENDENT_AMBULATORY_CARE_PROVIDER_SITE_OTHER): Payer: Medicare HMO | Admitting: Family Medicine

## 2019-12-14 ENCOUNTER — Encounter: Payer: Self-pay | Admitting: Family Medicine

## 2019-12-14 VITALS — BP 139/84 | HR 76 | Temp 98.0°F | Resp 17 | Wt 232.5 lb

## 2019-12-14 DIAGNOSIS — M79642 Pain in left hand: Secondary | ICD-10-CM

## 2019-12-14 DIAGNOSIS — M79641 Pain in right hand: Secondary | ICD-10-CM | POA: Diagnosis not present

## 2019-12-14 MED ORDER — DICLOFENAC SODIUM 1 % EX GEL: 2.0000 g | Freq: Four times a day (QID) | CUTANEOUS | 1 refills | Status: DC

## 2019-12-14 NOTE — Patient Instructions (Signed)
Follow up as needed or as scheduled Start the Voltaren gel on the hands up to 4x/day Use Acetaminophen (tylenol) as needed for breakthrough pain ICE your hands after a particularly busy day If no improvement or worsening, please let me know so we can refer you to a hand specialist Call with any questions or concerns Stay Safe!  Stay Healthy!

## 2019-12-14 NOTE — Progress Notes (Signed)
   Subjective:    Patient ID: Marvin Foster, male    DOB: 04-15-1934, 85 y.o.   MRN: [id number]  HPI Bilateral hand pain- more noticeable at base of thumbs bilateral.  R hand has tightness and aching of all fingers.  L hand dominant.  No known injury.  Does a lot of repetitive hand movements at his job at [employer].  Nothing improves sxs but nothing has been tried.  Nothing worsens pain.  Daily ASA recently stopped.  sxs started ~6 weeks ago and pt doesn't feel sxs are related to stopping ASA.   Review of Systems For ROS see HPI   This visit occurred during the SARS-CoV-2 public health emergency.  Safety protocols were in place, including screening questions prior to the visit, additional usage of staff PPE, and extensive cleaning of exam room while observing appropriate contact time as indicated for disinfecting solutions.       Objective:   Physical Exam Vitals reviewed.  Constitutional:      Appearance: He is obese.  HENT:     Head: Normocephalic and atraumatic.  Cardiovascular:     Pulses: Normal pulses.  Musculoskeletal:        General: Tenderness (TTP over L thumb proximal phalynx) present. No swelling, deformity or signs of injury.     Comments: Full ROM of thumbs bilaterally, full flexion/extension of fingers bilaterally  Skin:    General: Skin is warm and dry.     Findings: No bruising or erythema.  Neurological:     General: No focal deficit present.     Mental Status: He is alert and oriented to person, place, and time.           Assessment & Plan:  Bilateral hand pain- new.  Pt reports sxs started ~6 weeks ago.  Pt doesn't feel this corresponds w/ stopping his ASA.  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  Suspect OA.  Given his chronic anticoagulation will start topical Voltaren gel and acetaminophen as needed for break through pain.  If no improvement, will refer to hand specialist.  Pt expressed understanding  and is in agreement w/ plan.

## 2019-12-19 ENCOUNTER — Telehealth: Payer: Self-pay | Admitting: General Practice

## 2019-12-19 NOTE — Telephone Encounter (Signed)
Received a denial from insurance for Diclofenac gel. Per PCP ok for pt to purchase OTC.   Called and informed pt.

## 2019-12-21 ENCOUNTER — Encounter: Payer: Self-pay | Admitting: General Practice

## 2019-12-24 ENCOUNTER — Other Ambulatory Visit: Payer: Self-pay

## 2019-12-24 ENCOUNTER — Ambulatory Visit (INDEPENDENT_AMBULATORY_CARE_PROVIDER_SITE_OTHER): Payer: Medicare HMO | Admitting: Pharmacist

## 2019-12-24 DIAGNOSIS — Z5181 Encounter for therapeutic drug level monitoring: Secondary | ICD-10-CM

## 2019-12-24 DIAGNOSIS — I4891 Unspecified atrial fibrillation: Secondary | ICD-10-CM | POA: Diagnosis not present

## 2019-12-24 LAB — POCT INR: INR: 1.9 — AB (ref 2.0–3.0)

## 2019-12-24 NOTE — Patient Instructions (Signed)
Description   Take 1.5 tablet today, then continue on same dosage 1 tablet daily except for 1.5 tablets on Wednesdays. Repeat INR in 3 weeks.  Call us with any medication changes or concerns # [phone] [facility] # [phone].

## 2020-01-03 DIAGNOSIS — H401123 Primary open-angle glaucoma, left eye, severe stage: Secondary | ICD-10-CM | POA: Diagnosis not present

## 2020-01-03 DIAGNOSIS — H18519 Endothelial corneal dystrophy, unspecified eye: Secondary | ICD-10-CM | POA: Diagnosis not present

## 2020-01-03 DIAGNOSIS — H401112 Primary open-angle glaucoma, right eye, moderate stage: Secondary | ICD-10-CM | POA: Diagnosis not present

## 2020-01-14 ENCOUNTER — Encounter (INDEPENDENT_AMBULATORY_CARE_PROVIDER_SITE_OTHER): Payer: Medicare HMO | Admitting: Ophthalmology

## 2020-01-14 ENCOUNTER — Ambulatory Visit (INDEPENDENT_AMBULATORY_CARE_PROVIDER_SITE_OTHER): Payer: Medicare HMO | Admitting: Pharmacist

## 2020-01-14 ENCOUNTER — Telehealth: Payer: Self-pay | Admitting: Family Medicine

## 2020-01-14 ENCOUNTER — Other Ambulatory Visit: Payer: Self-pay

## 2020-01-14 DIAGNOSIS — Z5181 Encounter for therapeutic drug level monitoring: Secondary | ICD-10-CM

## 2020-01-14 DIAGNOSIS — I10 Essential (primary) hypertension: Secondary | ICD-10-CM | POA: Diagnosis not present

## 2020-01-14 DIAGNOSIS — H43813 Vitreous degeneration, bilateral: Secondary | ICD-10-CM

## 2020-01-14 DIAGNOSIS — H35033 Hypertensive retinopathy, bilateral: Secondary | ICD-10-CM | POA: Diagnosis not present

## 2020-01-14 DIAGNOSIS — H353221 Exudative age-related macular degeneration, left eye, with active choroidal neovascularization: Secondary | ICD-10-CM | POA: Diagnosis not present

## 2020-01-14 DIAGNOSIS — H353112 Nonexudative age-related macular degeneration, right eye, intermediate dry stage: Secondary | ICD-10-CM | POA: Diagnosis not present

## 2020-01-14 DIAGNOSIS — I4891 Unspecified atrial fibrillation: Secondary | ICD-10-CM | POA: Diagnosis not present

## 2020-01-14 LAB — POCT INR: INR: 2.4 (ref 2.0–3.0)

## 2020-01-14 NOTE — Progress Notes (Signed)
  Chronic Care Management   Outreach Note  01/14/2020 Name: Marvin Foster MRN: [id number] DOB: 1934/01/19  Referred by: [name], MD Reason for referral : No chief complaint on file.   An unsuccessful telephone outreach was attempted today. The patient was referred to the pharmacist for assistance with care management and care coordination.   Follow Up Plan:   Earney Hamburg Upstream Scheduler

## 2020-01-14 NOTE — Patient Instructions (Signed)
Description   Continue taking 1 tablet daily except for 1.5 tablets on Wednesdays. Repeat INR in 4 weeks.  Call us with any medication changes or concerns # [phone] [facility] Main # [phone].

## 2020-01-18 ENCOUNTER — Telehealth: Payer: Self-pay | Admitting: Family Medicine

## 2020-01-18 NOTE — Progress Notes (Signed)
  Chronic Care Management   Outreach Note  01/18/2020 Name: Marvin Foster MRN: [id number] DOB: [birth year]  Referred by: [name], MD Reason for referral : No chief complaint on file.   A second unsuccessful telephone outreach was attempted today. The patient was referred to pharmacist for assistance with care management and care coordination.  Follow Up Plan:   Earney Hamburg Upstream Scheduler

## 2020-01-31 ENCOUNTER — Telehealth: Payer: Self-pay | Admitting: Family Medicine

## 2020-01-31 NOTE — Progress Notes (Signed)
°  Chronic Care Management   Outreach Note  01/31/2020 Name: Marvin Foster MRN: [id number] DOB: [birth year]  Referred by: [name], MD Reason for referral : No chief complaint on file.   An unsuccessful telephone outreach was attempted today. The patient was referred to the pharmacist for assistance with care management and care coordination.   Follow Up Plan:   Earney Hamburg Upstream Scheduler

## 2020-02-04 ENCOUNTER — Telehealth: Payer: Self-pay

## 2020-02-04 NOTE — Telephone Encounter (Signed)
His A1C has been stable (as of last month) and it is not unusual for blood sugars to fluctuate.  As long as they are staying between 100-250, we are typically in good shape.  A sugar spike in the AM that comes down as the day goes on is typically related to either dinner or a late night snack the night before.  For now, he can continue to monitor his sugars so we can determine if future medication changes will be needed

## 2020-02-04 NOTE — Telephone Encounter (Signed)
Patient is concern about his blood sugar. Patients over the last few weeks his blood sugar spikes up and down. Patient states that one morning  He checked his blood sugar at 6am and it was over 200 two hours later it was 160 and two hours after that it was 112. Patient is concern about this. Patient states that yesterday he had a sugar taste in his mouth pretty much all day. Patient would like to know what should he do going forward regarding this. Please contact patient

## 2020-02-04 NOTE — Telephone Encounter (Signed)
Patient is aware of recommendations per PCP

## 2020-02-06 ENCOUNTER — Ambulatory Visit: Payer: Medicare HMO | Admitting: Family Medicine

## 2020-02-07 ENCOUNTER — Telehealth: Payer: Self-pay | Admitting: Family Medicine

## 2020-02-07 NOTE — Progress Notes (Signed)
°  Chronic Care Management   Outreach Note  02/07/2020 Name: BRANCH PELLY MRN: [id number] DOB: 12-14-1933  Referred by: [name], MD Reason for referral : No chief complaint on file.   An unsuccessful telephone outreach was attempted today. The patient was referred to the pharmacist for assistance with care management and care coordination.   This note is not being shared with the patient for the following reason: To respect privacy (The patient or proxy has requested that the information not be shared). Follow Up Plan:   Earney Hamburg Upstream Scheduler

## 2020-02-08 ENCOUNTER — Other Ambulatory Visit: Payer: Self-pay | Admitting: Cardiology

## 2020-02-11 ENCOUNTER — Ambulatory Visit: Payer: Medicare HMO

## 2020-02-11 ENCOUNTER — Other Ambulatory Visit: Payer: Self-pay

## 2020-02-11 DIAGNOSIS — I4891 Unspecified atrial fibrillation: Secondary | ICD-10-CM

## 2020-02-11 DIAGNOSIS — Z5181 Encounter for therapeutic drug level monitoring: Secondary | ICD-10-CM

## 2020-02-11 LAB — POCT INR: INR: 2.7 (ref 2.0–3.0)

## 2020-02-11 NOTE — Patient Instructions (Signed)
Description   Continue taking 1 tablet daily except for 1.5 tablets on Wednesdays. Repeat INR in 5 weeks.  Call us with any medication changes or concerns # [phone] [facility] Main # [phone].

## 2020-02-13 ENCOUNTER — Encounter (INDEPENDENT_AMBULATORY_CARE_PROVIDER_SITE_OTHER): Payer: Medicare HMO | Admitting: Ophthalmology

## 2020-02-13 DIAGNOSIS — I10 Essential (primary) hypertension: Secondary | ICD-10-CM | POA: Diagnosis not present

## 2020-02-13 DIAGNOSIS — E113293 Type 2 diabetes mellitus with mild nonproliferative diabetic retinopathy without macular edema, bilateral: Secondary | ICD-10-CM

## 2020-02-13 DIAGNOSIS — H353221 Exudative age-related macular degeneration, left eye, with active choroidal neovascularization: Secondary | ICD-10-CM | POA: Diagnosis not present

## 2020-02-13 DIAGNOSIS — H353112 Nonexudative age-related macular degeneration, right eye, intermediate dry stage: Secondary | ICD-10-CM

## 2020-02-13 DIAGNOSIS — H35371 Puckering of macula, right eye: Secondary | ICD-10-CM

## 2020-02-13 DIAGNOSIS — E11319 Type 2 diabetes mellitus with unspecified diabetic retinopathy without macular edema: Secondary | ICD-10-CM | POA: Diagnosis not present

## 2020-02-13 DIAGNOSIS — H35033 Hypertensive retinopathy, bilateral: Secondary | ICD-10-CM

## 2020-03-05 ENCOUNTER — Telehealth: Payer: Self-pay | Admitting: Family Medicine

## 2020-03-05 DIAGNOSIS — H6123 Impacted cerumen, bilateral: Secondary | ICD-10-CM

## 2020-03-05 DIAGNOSIS — Z1283 Encounter for screening for malignant neoplasm of skin: Secondary | ICD-10-CM

## 2020-03-05 NOTE — Telephone Encounter (Signed)
Marvin Foster would like to have  a skin cancer screening - insurance pays for it - Also needs an ear/nose/throat referral

## 2020-03-05 NOTE — Telephone Encounter (Signed)
Ok for referrals  

## 2020-03-06 ENCOUNTER — Other Ambulatory Visit: Payer: Self-pay | Admitting: Family Medicine

## 2020-03-06 NOTE — Addendum Note (Signed)
Addended by: Davis Gourd on: 03/06/2020 09:54 AM   Modules accepted: Orders

## 2020-03-06 NOTE — Telephone Encounter (Signed)
I am fine w/ both referrals, but I'm not sure the dx for ENT referral

## 2020-03-06 NOTE — Telephone Encounter (Signed)
I called and spoke with pt. HE has cerumen impactions and irritations due to his hearing aids. Both referrals placed today.

## 2020-03-12 ENCOUNTER — Other Ambulatory Visit: Payer: Self-pay

## 2020-03-12 ENCOUNTER — Encounter (INDEPENDENT_AMBULATORY_CARE_PROVIDER_SITE_OTHER): Payer: Medicare HMO | Admitting: Ophthalmology

## 2020-03-12 DIAGNOSIS — I10 Essential (primary) hypertension: Secondary | ICD-10-CM

## 2020-03-12 DIAGNOSIS — H903 Sensorineural hearing loss, bilateral: Secondary | ICD-10-CM | POA: Diagnosis not present

## 2020-03-12 DIAGNOSIS — H35033 Hypertensive retinopathy, bilateral: Secondary | ICD-10-CM | POA: Diagnosis not present

## 2020-03-12 DIAGNOSIS — H35371 Puckering of macula, right eye: Secondary | ICD-10-CM

## 2020-03-12 DIAGNOSIS — E113291 Type 2 diabetes mellitus with mild nonproliferative diabetic retinopathy without macular edema, right eye: Secondary | ICD-10-CM

## 2020-03-12 DIAGNOSIS — H43813 Vitreous degeneration, bilateral: Secondary | ICD-10-CM

## 2020-03-12 DIAGNOSIS — H353221 Exudative age-related macular degeneration, left eye, with active choroidal neovascularization: Secondary | ICD-10-CM | POA: Diagnosis not present

## 2020-03-12 DIAGNOSIS — E11319 Type 2 diabetes mellitus with unspecified diabetic retinopathy without macular edema: Secondary | ICD-10-CM

## 2020-03-12 DIAGNOSIS — H6123 Impacted cerumen, bilateral: Secondary | ICD-10-CM | POA: Diagnosis not present

## 2020-03-17 ENCOUNTER — Ambulatory Visit: Payer: Medicare HMO | Admitting: *Deleted

## 2020-03-17 ENCOUNTER — Other Ambulatory Visit: Payer: Self-pay

## 2020-03-17 DIAGNOSIS — Z5181 Encounter for therapeutic drug level monitoring: Secondary | ICD-10-CM

## 2020-03-17 DIAGNOSIS — I4891 Unspecified atrial fibrillation: Secondary | ICD-10-CM | POA: Diagnosis not present

## 2020-03-17 LAB — POCT INR: INR: 2.9 (ref 2.0–3.0)

## 2020-03-17 NOTE — Patient Instructions (Signed)
Description   Continue taking 1 tablet daily except for 1.5 tablets on Wednesdays. Repeat INR in 6 weeks.  Call us with any medication changes or concerns # [phone] [facility] Main # [phone].

## 2020-03-22 DIAGNOSIS — R69 Illness, unspecified: Secondary | ICD-10-CM | POA: Diagnosis not present

## 2020-03-25 ENCOUNTER — Telehealth: Payer: Self-pay | Admitting: *Deleted

## 2020-03-25 DIAGNOSIS — E1139 Type 2 diabetes mellitus with other diabetic ophthalmic complication: Secondary | ICD-10-CM

## 2020-03-25 NOTE — Telephone Encounter (Signed)
Please advise 

## 2020-03-25 NOTE — Telephone Encounter (Signed)
Pt has lab appt scheduled at [facility] office on 6/17 and OV with PCP on 6/24. I do not see any future orders in Epic yet.  Please place future orders if appropriate.

## 2020-03-26 NOTE — Telephone Encounter (Signed)
Labs ordered.

## 2020-03-26 NOTE — Telephone Encounter (Signed)
Ok for CBC w/ diff, BMP, LFTs, lipid panel, TSH- dx DM

## 2020-03-27 ENCOUNTER — Other Ambulatory Visit: Payer: Self-pay

## 2020-03-27 ENCOUNTER — Other Ambulatory Visit (INDEPENDENT_AMBULATORY_CARE_PROVIDER_SITE_OTHER): Payer: Medicare HMO

## 2020-03-27 DIAGNOSIS — E1139 Type 2 diabetes mellitus with other diabetic ophthalmic complication: Secondary | ICD-10-CM

## 2020-03-27 LAB — HEPATIC FUNCTION PANEL
ALT: 23 U/L (ref 0–53)
AST: 25 U/L (ref 0–37)
Albumin: 4.3 g/dL (ref 3.5–5.2)
Alkaline Phosphatase: 50 U/L (ref 39–117)
Bilirubin, Direct: 0.3 mg/dL (ref 0.0–0.3)
Total Bilirubin: 1 mg/dL (ref 0.2–1.2)
Total Protein: 6.8 g/dL (ref 6.0–8.3)

## 2020-03-27 LAB — BASIC METABOLIC PANEL
BUN: 19 mg/dL (ref 6–23)
CO2: 26 mEq/L (ref 19–32)
Calcium: 8.8 mg/dL (ref 8.4–10.5)
Chloride: 105 mEq/L (ref 96–112)
Creatinine, Ser: 0.99 mg/dL (ref 0.40–1.50)
GFR: 71.97 mL/min (ref >60.00)
Glucose, Bld: 166 mg/dL — ABNORMAL HIGH (ref 70–99)
Potassium: 4.8 mEq/L (ref 3.5–5.1)
Sodium: 139 mEq/L (ref 135–145)

## 2020-03-27 LAB — LIPID PANEL
Cholesterol: 105 mg/dL (ref 0–200)
HDL: 29.1 mg/dL — ABNORMAL LOW (ref >39.00)
LDL Cholesterol: 41 mg/dL (ref 0–99)
NonHDL: 76.08
Total CHOL/HDL Ratio: 4
Triglycerides: 174 mg/dL — ABNORMAL HIGH (ref 0.0–149.0)
VLDL: 34.8 mg/dL (ref 0.0–40.0)

## 2020-03-27 LAB — CBC WITH DIFFERENTIAL/PLATELET
Basophils Absolute: 0.1 10*3/uL (ref 0.0–0.1)
Basophils Relative: 1.2 % (ref 0.0–3.0)
Eosinophils Absolute: 0.2 10*3/uL (ref 0.0–0.7)
Eosinophils Relative: 3.4 % (ref 0.0–5.0)
HCT: 40.2 % (ref 39.0–52.0)
Hemoglobin: 13.3 g/dL (ref 13.0–17.0)
Lymphocytes Relative: 33.9 % (ref 12.0–46.0)
Lymphs Abs: 2.3 10*3/uL (ref 0.7–4.0)
MCHC: 33 g/dL (ref 30.0–36.0)
MCV: 94.1 fl (ref 78.0–100.0)
Monocytes Absolute: 0.7 10*3/uL (ref 0.1–1.0)
Monocytes Relative: 10.8 % (ref 3.0–12.0)
Neutro Abs: 3.4 10*3/uL (ref 1.4–7.7)
Neutrophils Relative %: 50.7 % (ref 43.0–77.0)
Platelets: 149 10*3/uL — ABNORMAL LOW (ref 150.0–400.0)
RBC: 4.27 Mil/uL (ref 4.22–5.81)
RDW: 14.7 % (ref 11.5–15.5)
WBC: 6.7 10*3/uL (ref 4.0–10.5)

## 2020-03-27 LAB — HEMOGLOBIN A1C: Hgb A1c MFr Bld: 6.8 % — ABNORMAL HIGH (ref 4.6–6.5)

## 2020-03-27 LAB — TSH: TSH: 2.42 u[IU]/mL (ref 0.35–4.50)

## 2020-03-28 ENCOUNTER — Encounter: Payer: Self-pay | Admitting: General Practice

## 2020-04-03 ENCOUNTER — Encounter: Payer: Self-pay | Admitting: Family Medicine

## 2020-04-03 ENCOUNTER — Other Ambulatory Visit: Payer: Self-pay

## 2020-04-03 ENCOUNTER — Ambulatory Visit (INDEPENDENT_AMBULATORY_CARE_PROVIDER_SITE_OTHER): Payer: Medicare HMO | Admitting: Family Medicine

## 2020-04-03 VITALS — BP 128/80 | HR 70 | Temp 98.0°F | Resp 16 | Ht 69.0 in | Wt 232.1 lb

## 2020-04-03 DIAGNOSIS — I10 Essential (primary) hypertension: Secondary | ICD-10-CM | POA: Diagnosis not present

## 2020-04-03 DIAGNOSIS — E1139 Type 2 diabetes mellitus with other diabetic ophthalmic complication: Secondary | ICD-10-CM

## 2020-04-03 DIAGNOSIS — E785 Hyperlipidemia, unspecified: Secondary | ICD-10-CM | POA: Diagnosis not present

## 2020-04-03 NOTE — Progress Notes (Signed)
   Subjective:    Patient ID: Marvin Foster, male    DOB: [birth year], 86 y.o.   MRN: [id number]  HPI DM- chronic problem, on Metformin 1000mg  BID w/ good control.  Last A1C 6.8.  UTD on eye exam, on ACE for renal protection.  Denies symptomatic lows.  Fasting sugar this AM 193 today- this has him very upset.  Fasting sugars are typically in the 130s.  Hyperlipidemia- chronic problem, on Lipitor 40mg  nightly.  LDL 41, HDL 29, Trigs 174.  Denies abd pain, N/V.  HTN- chronic problem, on Enalapril 5mg  daily and Metoprolol 100mg  BID w/ good control.  Recent Cr 0.99.  Denies CP, SOB, HAs, visual changes, edema   Review of Systems For ROS see HPI   This visit occurred during the SARS-CoV-2 public health emergency.  Safety protocols were in place, including screening questions prior to the visit, additional usage of staff PPE, and extensive cleaning of exam room while observing appropriate contact time as indicated for disinfecting solutions.       Objective:   Physical Exam Vitals reviewed.  Constitutional:      General: He is not in acute distress.    Appearance: He is well-developed. He is obese.  HENT:     Head: Normocephalic and atraumatic.  Eyes:     Conjunctiva/sclera: Conjunctivae normal.     Pupils: Pupils are equal, round, and reactive to light.  Neck:     Thyroid: No thyromegaly.  Cardiovascular:     Rate and Rhythm: Normal rate. Rhythm irregular.     Heart sounds: Murmur (II/VI SEM) heard.   Pulmonary:     Effort: Pulmonary effort is normal. No respiratory distress.     Breath sounds: Normal breath sounds.  Abdominal:     General: Bowel sounds are normal. There is no distension.     Palpations: Abdomen is soft.  Musculoskeletal:     Cervical back: Normal range of motion and neck supple.  Lymphadenopathy:     Cervical: No cervical adenopathy.  Skin:    General: Skin is warm and dry.  Neurological:     Mental Status: He is alert and oriented to person, place, and  time.     Cranial Nerves: No cranial nerve deficit.  Psychiatric:        Behavior: Behavior normal.           Assessment & Plan:

## 2020-04-03 NOTE — Assessment & Plan Note (Signed)
Chronic problem, on Lipitor daily w/ good control.  No changes at this time

## 2020-04-03 NOTE — Assessment & Plan Note (Signed)
Chronic problem.  Well controlled on Enalapril 5mg  daily and Metoprolol 100mg  BID.  Currently asymptomatic.  No changes at this time.

## 2020-04-03 NOTE — Patient Instructions (Signed)
Schedule your complete physical for after 10/21 Your labs look great!!  No changes at this time Continue to work on healthy diet and exercise as able Call with any questions or concerns Hang in there!  If we can help in any way, let me know

## 2020-04-03 NOTE — Assessment & Plan Note (Signed)
Chronic problem.  On Metformin w/ good control.  UTD on eye exam, on ACE for renal protection, and has upcoming podiatry exam.  No med changes at this time.  Will follow.

## 2020-04-16 ENCOUNTER — Other Ambulatory Visit: Payer: Self-pay

## 2020-04-16 ENCOUNTER — Encounter (INDEPENDENT_AMBULATORY_CARE_PROVIDER_SITE_OTHER): Payer: Medicare HMO | Admitting: Ophthalmology

## 2020-04-16 DIAGNOSIS — H43813 Vitreous degeneration, bilateral: Secondary | ICD-10-CM

## 2020-04-16 DIAGNOSIS — H35033 Hypertensive retinopathy, bilateral: Secondary | ICD-10-CM

## 2020-04-16 DIAGNOSIS — H353221 Exudative age-related macular degeneration, left eye, with active choroidal neovascularization: Secondary | ICD-10-CM | POA: Diagnosis not present

## 2020-04-16 DIAGNOSIS — I10 Essential (primary) hypertension: Secondary | ICD-10-CM | POA: Diagnosis not present

## 2020-04-21 DIAGNOSIS — R69 Illness, unspecified: Secondary | ICD-10-CM | POA: Diagnosis not present

## 2020-04-29 DIAGNOSIS — H401123 Primary open-angle glaucoma, left eye, severe stage: Secondary | ICD-10-CM | POA: Diagnosis not present

## 2020-04-29 DIAGNOSIS — H353132 Nonexudative age-related macular degeneration, bilateral, intermediate dry stage: Secondary | ICD-10-CM | POA: Diagnosis not present

## 2020-04-29 DIAGNOSIS — H401112 Primary open-angle glaucoma, right eye, moderate stage: Secondary | ICD-10-CM | POA: Diagnosis not present

## 2020-04-29 DIAGNOSIS — H35371 Puckering of macula, right eye: Secondary | ICD-10-CM | POA: Diagnosis not present

## 2020-04-29 DIAGNOSIS — E119 Type 2 diabetes mellitus without complications: Secondary | ICD-10-CM | POA: Diagnosis not present

## 2020-04-29 DIAGNOSIS — H18519 Endothelial corneal dystrophy, unspecified eye: Secondary | ICD-10-CM | POA: Diagnosis not present

## 2020-04-30 ENCOUNTER — Other Ambulatory Visit: Payer: Self-pay

## 2020-04-30 ENCOUNTER — Ambulatory Visit: Payer: Medicare HMO | Admitting: *Deleted

## 2020-04-30 DIAGNOSIS — Z5181 Encounter for therapeutic drug level monitoring: Secondary | ICD-10-CM | POA: Diagnosis not present

## 2020-04-30 DIAGNOSIS — I4891 Unspecified atrial fibrillation: Secondary | ICD-10-CM | POA: Diagnosis not present

## 2020-04-30 LAB — POCT INR: INR: 2.7 (ref 2.0–3.0)

## 2020-04-30 NOTE — Patient Instructions (Signed)
Description   Continue taking 1 tablet daily except for 1.5 tablets on Wednesdays. Repeat INR in 8 weeks.  Call us with any medication changes or concerns # [phone] [facility] Main # [phone].     

## 2020-05-08 ENCOUNTER — Other Ambulatory Visit: Payer: Self-pay | Admitting: Family Medicine

## 2020-05-19 DIAGNOSIS — I10 Essential (primary) hypertension: Secondary | ICD-10-CM | POA: Diagnosis not present

## 2020-05-19 DIAGNOSIS — I4891 Unspecified atrial fibrillation: Secondary | ICD-10-CM | POA: Diagnosis not present

## 2020-05-19 DIAGNOSIS — G8929 Other chronic pain: Secondary | ICD-10-CM | POA: Diagnosis not present

## 2020-05-19 DIAGNOSIS — E669 Obesity, unspecified: Secondary | ICD-10-CM | POA: Diagnosis not present

## 2020-05-19 DIAGNOSIS — H409 Unspecified glaucoma: Secondary | ICD-10-CM | POA: Diagnosis not present

## 2020-05-19 DIAGNOSIS — H547 Unspecified visual loss: Secondary | ICD-10-CM | POA: Diagnosis not present

## 2020-05-19 DIAGNOSIS — E039 Hypothyroidism, unspecified: Secondary | ICD-10-CM | POA: Diagnosis not present

## 2020-05-19 DIAGNOSIS — E1151 Type 2 diabetes mellitus with diabetic peripheral angiopathy without gangrene: Secondary | ICD-10-CM | POA: Diagnosis not present

## 2020-05-19 DIAGNOSIS — D6869 Other thrombophilia: Secondary | ICD-10-CM | POA: Diagnosis not present

## 2020-05-19 DIAGNOSIS — E785 Hyperlipidemia, unspecified: Secondary | ICD-10-CM | POA: Diagnosis not present

## 2020-05-21 DIAGNOSIS — L03019 Cellulitis of unspecified finger: Secondary | ICD-10-CM | POA: Diagnosis not present

## 2020-05-26 ENCOUNTER — Encounter (INDEPENDENT_AMBULATORY_CARE_PROVIDER_SITE_OTHER): Payer: Medicare HMO | Admitting: Ophthalmology

## 2020-05-26 ENCOUNTER — Other Ambulatory Visit: Payer: Self-pay

## 2020-05-26 DIAGNOSIS — H35033 Hypertensive retinopathy, bilateral: Secondary | ICD-10-CM | POA: Diagnosis not present

## 2020-05-26 DIAGNOSIS — H353111 Nonexudative age-related macular degeneration, right eye, early dry stage: Secondary | ICD-10-CM

## 2020-05-26 DIAGNOSIS — H43813 Vitreous degeneration, bilateral: Secondary | ICD-10-CM

## 2020-05-26 DIAGNOSIS — H353221 Exudative age-related macular degeneration, left eye, with active choroidal neovascularization: Secondary | ICD-10-CM | POA: Diagnosis not present

## 2020-05-26 DIAGNOSIS — I10 Essential (primary) hypertension: Secondary | ICD-10-CM

## 2020-05-28 ENCOUNTER — Ambulatory Visit: Payer: Medicare HMO | Admitting: Podiatry

## 2020-05-28 ENCOUNTER — Encounter: Payer: Self-pay | Admitting: Podiatry

## 2020-05-28 ENCOUNTER — Other Ambulatory Visit: Payer: Self-pay

## 2020-05-28 DIAGNOSIS — B351 Tinea unguium: Secondary | ICD-10-CM

## 2020-05-28 DIAGNOSIS — D689 Coagulation defect, unspecified: Secondary | ICD-10-CM | POA: Insufficient documentation

## 2020-05-28 DIAGNOSIS — M79675 Pain in left toe(s): Secondary | ICD-10-CM | POA: Diagnosis not present

## 2020-05-28 DIAGNOSIS — E119 Type 2 diabetes mellitus without complications: Secondary | ICD-10-CM

## 2020-05-28 HISTORY — DX: Tinea unguium: B35.1

## 2020-05-28 HISTORY — DX: Coagulation defect, unspecified: D68.9

## 2020-05-28 NOTE — Progress Notes (Signed)
This patient returns to my office for at risk foot care.  This patient requires this care by a professional since this patient will be at risk due to having coagulation defect and diabetes.   Patient is taking coumadin.  This patient is unable to cut nails himself since the patient cannot reach his nails.These nails are painful walking and wearing shoes.  He presents for his annual diabetic exam. This patient presents for at risk foot care today.  General Appearance  Alert, conversant and in no acute stress.  Vascular  Dorsalis pedis  pulses are palpable  Bilaterally. Posterior tibial pulses are weakly palpable due to swelling from venous stasis.  Capillary return is within normal limits  bilaterally. Temperature is within normal limits  Bilaterally. Severe swelling and venpous stasis lower legs  B/L.  Neurologic  Senn-Weinstein monofilament wire test within normal limits  bilaterally. Muscle power within normal limits bilaterally.  Nails Thick disfigured discolored nails with subungual debris  2,3 left foot. No evidence of bacterial infection or drainage bilaterally.  Orthopedic  No limitations of motion  feet .  No crepitus or effusions noted.  No bony pathology or digital deformities noted.  Skin  normotropic skin with no porokeratosis noted bilaterally.  No signs of infections or ulcers noted.     Onychomycosis    Pain in left toes  Consent was obtained for treatment procedures.   Mechanical debridement of nails 1-5  bilaterally performed with a nail nipper.  Filed with dremel without incident.  Diabetic foot exam was performed   Return office visit    1 year                 Told patient to return for periodic foot care and evaluation due to potential at risk complications.   Gardiner Barefoot DPM

## 2020-06-03 ENCOUNTER — Other Ambulatory Visit: Payer: Self-pay | Admitting: Family Medicine

## 2020-06-07 ENCOUNTER — Other Ambulatory Visit: Payer: Self-pay | Admitting: Family Medicine

## 2020-06-12 ENCOUNTER — Encounter: Payer: Self-pay | Admitting: Physician Assistant

## 2020-06-12 ENCOUNTER — Ambulatory Visit: Payer: Medicare HMO | Admitting: Physician Assistant

## 2020-06-12 ENCOUNTER — Telehealth: Payer: Self-pay | Admitting: Physician Assistant

## 2020-06-12 ENCOUNTER — Other Ambulatory Visit: Payer: Self-pay

## 2020-06-12 DIAGNOSIS — L820 Inflamed seborrheic keratosis: Secondary | ICD-10-CM | POA: Diagnosis not present

## 2020-06-12 DIAGNOSIS — Z1283 Encounter for screening for malignant neoplasm of skin: Secondary | ICD-10-CM | POA: Diagnosis not present

## 2020-06-12 DIAGNOSIS — B356 Tinea cruris: Secondary | ICD-10-CM | POA: Diagnosis not present

## 2020-06-12 DIAGNOSIS — L570 Actinic keratosis: Secondary | ICD-10-CM | POA: Diagnosis not present

## 2020-06-12 MED ORDER — CICLOPIROX OLAMINE 0.77 % EX CREA: TOPICAL_CREAM | Freq: Two times a day (BID) | CUTANEOUS | 0 refills | Status: DC

## 2020-06-12 NOTE — Progress Notes (Signed)
   New Patient Visit  Subjective  Marvin Foster is a 86 y.o. male who presents for the following: Establish Care and Annual Exam (skin check). His only concern is that he has bumps on his scalp and the scalp itches. This has been a problem for years. He has never tried any treatments for it. He doesn't use a medicated shampoo.   Objective  Well appearing patient in no apparent distress; mood and affect are within normal limits.  A full examination was performed including scalp, head, eyes, ears, nose, lips, neck, chest, axillae, abdomen, back, buttocks, bilateral upper extremities, bilateral lower extremities, hands, feet, fingers, toes, fingernails, and toenails. All findings within normal limits unless otherwise noted below. No suspicious moles noted on back.  Objective  Mid Parietal Scalp: Erythematous patches with gritty scale.  Objective  Mid Frontal Scalp: Erythematous stuck-on, waxy papule or plaque.   Objective  Mid Back: FULL BODY SKIN EXAM   Objective  Left Genitocrural Fold, Right Genitocrural Fold: Erythema and maceration  Assessment & Plan  AK (actinic keratosis) Mid Parietal Scalp  Destruction of lesion - Mid Parietal Scalp Complexity: simple   Destruction method: cryotherapy   Informed consent: discussed and consent obtained   Timeout:  patient name, date of birth, surgical site, and procedure verified Lesion destroyed using liquid nitrogen: Yes   Outcome: patient tolerated procedure well with no complications    Inflamed seborrheic keratosis Mid Frontal Scalp  Destruction of lesion - Mid Frontal Scalp Complexity: simple   Destruction method: cryotherapy   Informed consent: discussed and consent obtained   Timeout:  patient name, date of birth, surgical site, and procedure verified Lesion destroyed using liquid nitrogen: Yes   Outcome: patient tolerated procedure well with no complications    Screening for malignant neoplasm of skin Mid  Back  Tinea cruris (2) Left Genitocrural Fold; Right Genitocrural Fold  He has been using hydrocortisone cream on this area. I will have him D/C that for now.  ciclopirox (LOPROX) 0.77 % cream - Left Genitocrural Fold, Right Genitocrural Fold

## 2020-06-12 NOTE — Telephone Encounter (Signed)
Patient was told to call back and let JCB know what medication he was using. Per patient it is Hydrocortisone 1%.

## 2020-06-13 NOTE — Telephone Encounter (Signed)
-----   Message from Arlyss Gandy, [place] sent at 06/12/2020  9:40 PM EDT ----- Please let pt know to stop hydrocortisone cream. I sent him a RX for ciclopirox cream to [facility]. His insurance should cover and if they dont the cash pay price is $26 for a large tube with Good rx. I would like him to use this bid and have a follow up in 6 weeks. He can continue his zeasorb powder.

## 2020-06-13 NOTE — Telephone Encounter (Signed)
Phone call to patient to let him know that Anderson Malta called him in a prescription for ciclopirox cream for his rash use bid with his powder. He is to stop the hydrocortisone cream and follow up in 6 weeks.

## 2020-06-25 ENCOUNTER — Ambulatory Visit: Payer: Medicare HMO | Admitting: *Deleted

## 2020-06-25 ENCOUNTER — Other Ambulatory Visit: Payer: Self-pay

## 2020-06-25 DIAGNOSIS — I4891 Unspecified atrial fibrillation: Secondary | ICD-10-CM

## 2020-06-25 DIAGNOSIS — Z5181 Encounter for therapeutic drug level monitoring: Secondary | ICD-10-CM

## 2020-06-25 LAB — POCT INR: INR: 2.6 (ref 2.0–3.0)

## 2020-06-25 NOTE — Patient Instructions (Signed)
Description   Continue taking 1 tablet daily except for 1.5 tablets on Wednesdays. Repeat INR in 8 weeks.  Call us with any medication changes or concerns # [phone] [facility] Main # [phone].     

## 2020-06-27 DIAGNOSIS — B349 Viral infection, unspecified: Secondary | ICD-10-CM | POA: Diagnosis not present

## 2020-06-27 DIAGNOSIS — Z20822 Contact with and (suspected) exposure to covid-19: Secondary | ICD-10-CM | POA: Diagnosis not present

## 2020-07-01 DIAGNOSIS — R69 Illness, unspecified: Secondary | ICD-10-CM | POA: Diagnosis not present

## 2020-07-03 ENCOUNTER — Encounter (INDEPENDENT_AMBULATORY_CARE_PROVIDER_SITE_OTHER): Payer: Medicare HMO | Admitting: Ophthalmology

## 2020-07-06 ENCOUNTER — Encounter (HOSPITAL_COMMUNITY): Payer: Self-pay

## 2020-07-06 ENCOUNTER — Ambulatory Visit (INDEPENDENT_AMBULATORY_CARE_PROVIDER_SITE_OTHER): Payer: Medicare HMO

## 2020-07-06 ENCOUNTER — Ambulatory Visit (HOSPITAL_COMMUNITY)
Admission: EM | Admit: 2020-07-06 | Discharge: 2020-07-06 | Disposition: A | Discharge status: Home / Self Care | Payer: Medicare HMO | Attending: Emergency Medicine | Admitting: Emergency Medicine

## 2020-07-06 ENCOUNTER — Inpatient Hospital Stay (HOSPITAL_COMMUNITY)
Admission: EM | Admit: 2020-07-06 | Discharge: 2020-07-08 | DRG: 177 | Disposition: A | Discharge status: Home Health | Payer: Medicare HMO | Attending: Family Medicine | Admitting: Family Medicine

## 2020-07-06 ENCOUNTER — Other Ambulatory Visit: Payer: Self-pay

## 2020-07-06 ENCOUNTER — Encounter (HOSPITAL_COMMUNITY): Payer: Self-pay | Admitting: Emergency Medicine

## 2020-07-06 DIAGNOSIS — I4821 Permanent atrial fibrillation: Secondary | ICD-10-CM | POA: Diagnosis present

## 2020-07-06 DIAGNOSIS — J1282 Pneumonia due to coronavirus disease 2019: Secondary | ICD-10-CM

## 2020-07-06 DIAGNOSIS — Z7984 Long term (current) use of oral hypoglycemic drugs: Secondary | ICD-10-CM

## 2020-07-06 DIAGNOSIS — I4510 Unspecified right bundle-branch block: Secondary | ICD-10-CM | POA: Diagnosis present

## 2020-07-06 DIAGNOSIS — M1990 Unspecified osteoarthritis, unspecified site: Secondary | ICD-10-CM | POA: Diagnosis present

## 2020-07-06 DIAGNOSIS — I4891 Unspecified atrial fibrillation: Secondary | ICD-10-CM | POA: Diagnosis present

## 2020-07-06 DIAGNOSIS — I2510 Atherosclerotic heart disease of native coronary artery without angina pectoris: Secondary | ICD-10-CM | POA: Diagnosis present

## 2020-07-06 DIAGNOSIS — Z881 Allergy status to other antibiotic agents status: Secondary | ICD-10-CM

## 2020-07-06 DIAGNOSIS — I10 Essential (primary) hypertension: Secondary | ICD-10-CM | POA: Diagnosis present

## 2020-07-06 DIAGNOSIS — R0602 Shortness of breath: Secondary | ICD-10-CM

## 2020-07-06 DIAGNOSIS — U071 COVID-19: Secondary | ICD-10-CM

## 2020-07-06 DIAGNOSIS — Z888 Allergy status to other drugs, medicaments and biological substances status: Secondary | ICD-10-CM

## 2020-07-06 DIAGNOSIS — R05 Cough: Secondary | ICD-10-CM | POA: Diagnosis not present

## 2020-07-06 DIAGNOSIS — I2583 Coronary atherosclerosis due to lipid rich plaque: Secondary | ICD-10-CM | POA: Diagnosis present

## 2020-07-06 DIAGNOSIS — J9600 Acute respiratory failure, unspecified whether with hypoxia or hypercapnia: Secondary | ICD-10-CM | POA: Diagnosis present

## 2020-07-06 DIAGNOSIS — Z955 Presence of coronary angioplasty implant and graft: Secondary | ICD-10-CM

## 2020-07-06 DIAGNOSIS — I517 Cardiomegaly: Secondary | ICD-10-CM | POA: Diagnosis not present

## 2020-07-06 DIAGNOSIS — H409 Unspecified glaucoma: Secondary | ICD-10-CM | POA: Diagnosis present

## 2020-07-06 DIAGNOSIS — Z7901 Long term (current) use of anticoagulants: Secondary | ICD-10-CM

## 2020-07-06 DIAGNOSIS — E1169 Type 2 diabetes mellitus with other specified complication: Secondary | ICD-10-CM | POA: Diagnosis present

## 2020-07-06 DIAGNOSIS — E119 Type 2 diabetes mellitus without complications: Secondary | ICD-10-CM

## 2020-07-06 DIAGNOSIS — Z880 Allergy status to penicillin: Secondary | ICD-10-CM

## 2020-07-06 DIAGNOSIS — E039 Hypothyroidism, unspecified: Secondary | ICD-10-CM | POA: Diagnosis present

## 2020-07-06 DIAGNOSIS — H18609 Keratoconus, unspecified, unspecified eye: Secondary | ICD-10-CM | POA: Diagnosis present

## 2020-07-06 DIAGNOSIS — Z79899 Other long term (current) drug therapy: Secondary | ICD-10-CM

## 2020-07-06 DIAGNOSIS — Z8249 Family history of ischemic heart disease and other diseases of the circulatory system: Secondary | ICD-10-CM

## 2020-07-06 DIAGNOSIS — E785 Hyperlipidemia, unspecified: Secondary | ICD-10-CM | POA: Diagnosis present

## 2020-07-06 DIAGNOSIS — A0839 Other viral enteritis: Secondary | ICD-10-CM | POA: Diagnosis present

## 2020-07-06 DIAGNOSIS — Z79890 Hormone replacement therapy (postmenopausal): Secondary | ICD-10-CM

## 2020-07-06 LAB — PROTIME-INR
INR: 3.4 — ABNORMAL HIGH (ref 0.8–1.2)
Prothrombin Time: 33.5 seconds — ABNORMAL HIGH (ref 11.4–15.2)

## 2020-07-06 LAB — BASIC METABOLIC PANEL
Anion gap: 11 (ref 5–15)
BUN: 12 mg/dL (ref 8–23)
CO2: 24 mmol/L (ref 22–32)
Calcium: 8.7 mg/dL — ABNORMAL LOW (ref 8.9–10.3)
Chloride: 101 mmol/L (ref 98–111)
Creatinine, Ser: 0.94 mg/dL (ref 0.61–1.24)
GFR calc Af Amer: >60 mL/min (ref >60)
GFR calc non Af Amer: >60 mL/min (ref >60)
Glucose, Bld: 138 mg/dL — ABNORMAL HIGH (ref 70–99)
Potassium: 5.1 mmol/L (ref 3.5–5.1)
Sodium: 136 mmol/L (ref 135–145)

## 2020-07-06 LAB — CBC
HCT: 37.3 % — ABNORMAL LOW (ref 39.0–52.0)
Hemoglobin: 12.1 g/dL — ABNORMAL LOW (ref 13.0–17.0)
MCH: 30.6 pg (ref 26.0–34.0)
MCHC: 32.4 g/dL (ref 30.0–36.0)
MCV: 94.2 fL (ref 80.0–100.0)
Platelets: 207 10*3/uL (ref 150–400)
RBC: 3.96 MIL/uL — ABNORMAL LOW (ref 4.22–5.81)
RDW: 13.8 % (ref 11.5–15.5)
WBC: 7.6 10*3/uL (ref 4.0–10.5)
nRBC: 0 % (ref 0.0–0.2)

## 2020-07-06 MED ORDER — ERYTHROMYCIN 5 MG/GM OP OINT: 1.0000 "application " | TOPICAL_OINTMENT | Freq: Once | OPHTHALMIC | Status: DC

## 2020-07-06 MED ORDER — FLUORESCEIN SODIUM 1 MG OP STRP
1.0000 | ORAL_STRIP | Freq: Once | OPHTHALMIC | Status: AC
  Administered 2020-07-07: 1 via OPHTHALMIC
  Filled 2020-07-06: qty 1

## 2020-07-06 MED ORDER — ACETAMINOPHEN 500 MG PO TABS
1000.0000 mg | ORAL_TABLET | Freq: Once | ORAL | Status: AC
  Administered 2020-07-06: 1000 mg via ORAL
  Filled 2020-07-06: qty 2

## 2020-07-06 MED ORDER — ERYTHROMYCIN 5 MG/GM OP OINT
1.0000 "application " | TOPICAL_OINTMENT | Freq: Four times a day (QID) | OPHTHALMIC | Status: AC
  Administered 2020-07-07 (×4): 1 via OPHTHALMIC
  Filled 2020-07-06: qty 3.5

## 2020-07-06 MED ORDER — METOPROLOL TARTRATE 25 MG PO TABS
100.0000 mg | ORAL_TABLET | Freq: Once | ORAL | Status: AC
  Administered 2020-07-06: 100 mg via ORAL
  Filled 2020-07-06: qty 4

## 2020-07-06 MED ORDER — TETRACAINE HCL 0.5 % OP SOLN
2.0000 [drp] | Freq: Once | OPHTHALMIC | Status: AC
  Administered 2020-07-07: 2 [drp] via OPHTHALMIC
  Filled 2020-07-06: qty 4

## 2020-07-06 MED ORDER — FLUORESCEIN SODIUM 1 MG OP STRP: 1.0000 | ORAL_STRIP | Freq: Once | OPHTHALMIC | Status: DC

## 2020-07-06 MED ORDER — SODIUM CHLORIDE 0.9 % IV BOLUS
500.0000 mL | Freq: Once | INTRAVENOUS | Status: AC
  Administered 2020-07-06: 500 mL via INTRAVENOUS

## 2020-07-06 NOTE — ED Triage Notes (Signed)
Patient has tested positive for covid.  Patient has the lingering cough.

## 2020-07-06 NOTE — ED Provider Notes (Signed)
[facility] EMERGENCY DEPARTMENT Provider Note   CSN: [id number] Arrival date & time: 07/06/20  1912     History Chief Complaint  Patient presents with  . COVID    Marvin Foster is a 86 y.o. male.  HPI   86 year old male with a history of arthritis, CAD, diabetes, diverticulosis, hyperlipidemia, hypertension, atrial fibrillation (on Coumadin), who presents to the emergency department today for evaluation of Covid symptoms.  Patient initially presented for evaluation of Covid symptoms to urgent care.  He reportedly tested positive for Covid on 06/25/2020.  He reports cough, body aches, intermittent diarrhea.  He denies any chest pain, shortness of breath, vomiting.  He had a chest x-ray completed at urgent care which showed Covid pneumonia.  He was sent here for further evaluation.  He further reports that he has had some irritation to the left eye with a foreign body sensation for the last few days.  He denies any visual changes or significant eye pain.  He has not had any drainage or other neuro complaints.  Past Medical History:  Diagnosis Date  . Arthritis 12/2005   left hand  . Arthritis    Left knee  . Blood transfusion without reported diagnosis   . Bursitis of knee    right knee  . CAD (coronary artery disease)   . Diabetes mellitus   . Diverticulosis   . Glaucoma    left eye  . Hyperlipidemia   . Hypertension   . Neuropathy    with pain  . Spinal stenosis 01/20/05  . Tubular adenoma of colon     Patient Active Problem List   Diagnosis Date Noted  . Coagulation defect ([place]) 05/28/2020  . Pain due to onychomycosis of toenail of left foot 05/28/2020  . Hypothyroid 09/21/2018  . Hyperlipidemia 08/05/2015  . Coronary artery disease due to lipid rich plaque 08/05/2015  . Hx of colonic polyps 06/12/2015  . Chronic anticoagulation 06/12/2015  . 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 Dermatitis of ear canal 05/07/2015  . Encounter for therapeutic drug  monitoring 11/06/2013  . Atrial fibrillation ([place]) 07/24/2013  . Long term current use of anticoagulant therapy 07/24/2013  . Bilateral leg weakness 06/07/2013  . 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 Scrotal mass 03/07/2012  . General medical examination 02/28/2012  . ANEMIA 02/18/2010  . HEARING LOSS 12/22/2009  . ASPARTATE AMINOTRANSFERASE, SERUM, ELEVATED 11/04/2008  . LEG CRAMPS, NOCTURNAL 08/16/2008  . Diabetes mellitus type II, controlled ([place]) 05/12/2007  . Essential hypertension 03/27/2007  . CORONARY ARTERY DISEASE 03/27/2007    Past Surgical History:  Procedure Laterality Date  . BACK SURGERY    . CATARACT EXTRACTION  08/11/09   right  . corneal endothelial Left 2013  . CORNEAL TRANSPLANT Left    eye  . CORONARY ANGIOPLASTY WITH STENT PLACEMENT    . EYE SURGERY    . SMALL INTESTINE SURGERY    . SPINE SURGERY    . VASECTOMY         Family History  Problem Relation Age of Onset  . Heart failure Mother   . Heart failure Father   . Stomach cancer Neg Hx   . Colon cancer Neg Hx   . Pancreatic cancer Neg Hx   . Throat cancer Neg Hx     Social History   Tobacco Use  . Smoking status: Never Smoker  . Smokeless tobacco: Never Used  Vaping Use  . Vaping Use: Never  used  Substance Use Topics  . Alcohol use: Yes    Alcohol/week: 0.0 standard drinks    Comment: Very seldom---socially  . Drug use: No    Home Medications Prior to Admission medications   Medication Sig Start Date End Date Taking? Authorizing Provider  atorvastatin (LIPITOR) 40 MG tablet TAKE 1 TABLET BY MOUTH AT BEDTIME Patient taking differently: Take 40 mg by mouth at bedtime.  06/04/20  Yes [name], MD  Calcium Carbonate-Vitamin D (CALCIUM 600 + D PO) Take 1 tablet by mouth 2 (two) times daily.    Yes [provider]  diclofenac Sodium (VOLTAREN) 1 % GEL Apply 2 g topically 4 (four) times daily. Patient taking differently: Apply 2 g  topically 4 (four) times daily as needed (pain).  12/14/19  Yes [name], MD  enalapril (VASOTEC) 5 MG tablet Take 1 tablet (5 mg total) by mouth daily. 11/21/19  Yes Jerline Pain, MD  EUTHYROX 75 MCG tablet Take 1 tablet by mouth once daily 05/08/20  Yes [name], MD  fluorometholone (FML) 0.1 % ophthalmic suspension Place 1 drop into both eyes every 30 (thirty) days.  10/20/16  Yes [provider]  latanoprost (XALATAN) 0.005 % ophthalmic solution Place 1 drop into both eyes at bedtime.  01/14/17  Yes [provider]  metFORMIN (GLUCOPHAGE) 1000 MG tablet Take 1 tablet by mouth twice daily Patient taking differently: Take 1,000 mg by mouth 2 (two) times daily with a meal.  06/09/20  Yes [name], MD  metoprolol tartrate (LOPRESSOR) 100 MG tablet Take 1 tablet by mouth twice daily Patient taking differently: Take 100 mg by mouth 2 (two) times daily.  06/04/20  Yes [name], MD  Multiple Vitamin (MULTIVITAMIN) capsule Take 1 capsule by mouth daily.     Yes [provider]  Omega-3 Fatty Acids (FISH OIL) 1200 MG CAPS Take 3 capsules by mouth daily.   Yes [provider]  timolol (TIMOPTIC) 0.5 % ophthalmic solution Place 1 drop into both eyes daily.  10/19/16  Yes [provider]  warfarin (COUMADIN) 2.5 MG tablet TAKE AS DIRECTED BY  COUMADIN  CLINIC Patient taking differently: Take 2.5-3.75 mg by mouth See admin instructions. TAKE AS  DIRECTED BY COUMADIN  CLINIC Wed 3.75 mg and all other days 2.5 mg daily 02/11/20  Yes Jerline Pain, MD  Lancets [facility] ULTRASOFT) lancets Use as instructed 02/02/19   [name], MD  [facility] VERIO test strip USE 1 STRIP TO CHECK GLUCOSE TWICE DAILY AS DIRECTED 09/27/19   [name], MD    Allergies    Neomycin-bacitracin zn-polymyx, Penicillins, and Other  Review of Systems   Review of Systems  Constitutional: Positive for fever. Negative for chills and  diaphoresis.  HENT: Negative for ear pain and sore throat.   Eyes: Negative for photophobia, pain, discharge, redness and visual disturbance.       Fb sensation to the eye  Respiratory: Negative for shortness of breath.   Cardiovascular: Positive for leg swelling (chronic, unchanged). Negative for chest pain.  Gastrointestinal: Positive for diarrhea. Negative for abdominal pain, constipation, nausea and vomiting.  Genitourinary: Negative for dysuria and hematuria.  Musculoskeletal: Negative for back pain.  Skin: Negative for rash.  Neurological: Negative for headaches.  All other systems reviewed and are negative.   Physical Exam Updated Vital Signs BP (!) 155/73   Pulse (!) 101   Temp (!) 101.2 F (38.4 C) (Oral)  Resp (!) 29   Ht 5\' 9"  (1.753 m)   Wt 104.3 kg   SpO2 97%   BMI 33.97 kg/m   Physical Exam Vitals and nursing note reviewed.  Constitutional:      Appearance: He is well-developed.  HENT:     Head: Normocephalic and atraumatic.  Eyes:     Extraocular Movements: Extraocular movements intact.     Conjunctiva/sclera: Conjunctivae normal.     Comments: Right pupil irregularly shaped (chronic).  Fluorescein stain completed and did not show any uptake.  Intraocular pressures were normal bilaterally.  Cardiovascular:     Heart sounds: Normal heart sounds. No murmur heard.      Comments: Tachycardic, irregularly irregular Pulmonary:     Effort: Pulmonary effort is normal. No respiratory distress.     Breath sounds: Rales present.  Abdominal:     General: Bowel sounds are normal.     Palpations: Abdomen is soft.     Tenderness: There is no abdominal tenderness. There is no guarding or rebound.  Musculoskeletal:     Cervical back: Neck supple.  Skin:    General: Skin is warm and dry.  Neurological:     Mental Status: He is alert.     ED Results / Procedures / Treatments   Labs (all labs ordered are listed, but only abnormal results are displayed) Labs  Reviewed  CBC - Abnormal; Notable for the following components:      Result Value   RBC 3.96 (*)    Hemoglobin 12.1 (*)    HCT 37.3 (*)    All other components within normal limits  BASIC METABOLIC PANEL - Abnormal; Notable for the following components:   Glucose, Bld 138 (*)    Calcium 8.7 (*)    All other components within normal limits  PROTIME-INR - Abnormal; Notable for the following components:   Prothrombin Time 33.5 (*)    INR 3.4 (*)    All other components within normal limits  RESPIRATORY PANEL BY RT PCR (FLU A&B, COVID)  CULTURE, BLOOD (ROUTINE X 2)  CULTURE, BLOOD (ROUTINE X 2)  LACTIC ACID, PLASMA  LACTIC ACID, PLASMA  D-DIMER, QUANTITATIVE (NOT AT [facility])  PROCALCITONIN  LACTATE DEHYDROGENASE  FERRITIN  TRIGLYCERIDES  FIBRINOGEN  C-REACTIVE PROTEIN  HEPATIC FUNCTION PANEL    EKG EKG Interpretation  Date/Time:  Sunday July 06 2020 20:04:44 EDT Ventricular Rate:  111 PR Interval:    QRS Duration: 142 QT Interval:  364 QTC Calculation: 495 R Axis:   49 Text Interpretation: Atrial fibrillation with rapid ventricular response Right bundle branch block Abnormal ECG Since prior ECG in 2006 in our system he now has RBBB and atrial fibrillation Confirmed by Gareth Morgan [phone]) on 07/06/2020 9:56:46 PM   Radiology DG Chest 2 View  Result Date: 07/06/2020 CLINICAL DATA:  Cough. COVID positive 9 days ago. Shortness of breath. EXAM: CHEST - 2 VIEW COMPARISON:  Rib radiographs 06/26/2018 FINDINGS: Mild cardiomegaly. Normal mediastinal contours. Aortic atherosclerosis. Coronary artery calcifications versus stent. Patchy heterogeneous bilateral airspace opacities in a mid-lower lung zone predominant distribution. No pneumothorax or evidence of pneumomediastinum. No significant pleural effusion. Degenerative change of the shoulders. IMPRESSION: Patchy heterogeneous bilateral airspace opacities in a mid-lower lung zone predominant distribution, consistent with  COVID-19 pneumonia. Electronically Signed   By: Keith Rake M.D.   On: 07/06/2020 18:46    Procedures Procedures (including critical care time)   Visual Acuity  Right Eye Distance:   Left Eye Distance:   Bilateral  Distance:    Right Eye Near: R Near: 20/100 Left Eye Near:  L Near: 20/50 Bilateral Near:  20/125   Medications Ordered in ED Medications  fluorescein ophthalmic strip 1 strip (has no administration in time range)  fluorescein ophthalmic strip 1 strip (has no administration in time range)  tetracaine (PONTOCAINE) 0.5 % ophthalmic solution 2 drop (has no administration in time range)  erythromycin ophthalmic ointment 1 application (has no administration in time range)  acetaminophen (TYLENOL) tablet 1,000 mg (1,000 mg Oral Given 07/06/20 2207)  sodium chloride 0.9 % bolus 500 mL (0 mLs Intravenous Stopped 07/06/20 2323)  metoprolol tartrate (LOPRESSOR) tablet 100 mg (100 mg Oral Given 07/06/20 2207)    ED Course  I have reviewed the triage vital signs and the nursing notes.  Pertinent labs & imaging results that were available during my care of the patient were reviewed by me and considered in my medical decision making (see chart for details).    MDM Rules/Calculators/A&P                          86 year old male presenting the emergency department today for evaluation of Covid symptoms.  Seen in urgent care prior to arrival and had a chest x-ray which showed bilateral pneumonia so he was sent here for further evaluation due to his comorbidities and age.  Patient satting well on room air on my arrival.  He is tachycardic and appears to be in A. Fib. He has a temp of 101.2.  Pt ambulated with pulse ox and he dropped as low as 84% with ambulation.    Reviewed/interpreted labs CBC is without leukocytosis, mild anemia present BMP with normal electrolytes, normal renal function PTINR 3.4 covid inflammatory markers obtained and pending on admission  EKG - Atrial  fibrillation with rapid ventricular response Right bundle branch block Abnormal ECG Since prior ECG in 2006 in our system he now has RBBB and atrial fibrillation  CXR reviewed/interpreted - Patchy heterogeneous bilateral airspace opacities in a mid-lower lung zone predominant distribution, consistent with COVID-19 pneumonia.  Fluoroscein stain completed and did not show any uptake. Visual acuity as above. Erythromycin ointment given in the ED to cover for possible corneal abrasion. Will need ophtho f/u as outpatient.   Pt was given tylenol, his home metoprolol for his afib with rvr (now improved), and a small fluid bolus as he appears dry on exam and has had diarrhea. Due to concern that he is desatting with ambulation, will admit for further tx with consideration for remdesivir and decadron.   11:31 PM CONSULT with Dr. Jonelle Sidle who accepts patient for admission.    Final Clinical Impression(s) / ED Diagnoses Final diagnoses:  [id number]    Rx / DC Orders ED Discharge Orders    None       Bishop Dublin 07/06/20 2333    Gareth Morgan, MD 07/07/20 8315    Gareth Morgan, MD 07/07/20 [phone]

## 2020-07-06 NOTE — ED Provider Notes (Signed)
[facility]    CSN: [id number] Arrival date & time: 07/06/20  1707      History   Chief Complaint Chief Complaint  Patient presents with  . Cough    HPI Marvin Foster is a 86 y.o. male.   Patient presents with cough which is occasionally productive of sputum.  Treatment attempted at home with Tessalon Perles and Coricidin HBP.  He tested positive for COVID on 06/25/2020.  He denies fever, shortness of breath, vomiting, unusual diarrhea, or other symptoms.  Patient has occasional diarrhea at baseline.  Patient's medical history includes diabetes, CAD, hypertension, A. fib, anticoagulant therapy.  The history is provided by the patient and a relative.    Past Medical History:  Diagnosis Date  . Arthritis 12/2005   left hand  . Arthritis    Left knee  . Blood transfusion without reported diagnosis   . Bursitis of knee    right knee  . CAD (coronary artery disease)   . Diabetes mellitus   . Diverticulosis   . Glaucoma    left eye  . Hyperlipidemia   . Hypertension   . Neuropathy    with pain  . Spinal stenosis 01/20/05  . Tubular adenoma of colon     Patient Active Problem List   Diagnosis Date Noted  . Coagulation defect ([place]) 05/28/2020  . Pain due to onychomycosis of toenail of left foot 05/28/2020  . Hypothyroid 09/21/2018  . Hyperlipidemia 08/05/2015  . Coronary artery disease due to lipid rich plaque 08/05/2015  . Hx of colonic polyps 06/12/2015  . Chronic anticoagulation 06/12/2015  . 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 Dermatitis of ear canal 05/07/2015  . Encounter for therapeutic drug monitoring 11/06/2013  . Atrial fibrillation ([place]) 07/24/2013  . Long term current use of anticoagulant therapy 07/24/2013  . Bilateral leg weakness 06/07/2013  . 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 Scrotal mass 03/07/2012  . General medical examination 02/28/2012  . ANEMIA 02/18/2010  . HEARING LOSS 12/22/2009  . ASPARTATE  AMINOTRANSFERASE, SERUM, ELEVATED 11/04/2008  . LEG CRAMPS, NOCTURNAL 08/16/2008  . Diabetes mellitus type II, controlled ([place]) 05/12/2007  . Essential hypertension 03/27/2007  . CORONARY ARTERY DISEASE 03/27/2007    Past Surgical History:  Procedure Laterality Date  . BACK SURGERY    . CATARACT EXTRACTION  08/11/09   right  . corneal endothelial Left 2013  . CORNEAL TRANSPLANT Left    eye  . CORONARY ANGIOPLASTY WITH STENT PLACEMENT    . EYE SURGERY    . SMALL INTESTINE SURGERY    . SPINE SURGERY    . VASECTOMY         Home Medications    Prior to Admission medications   Medication Sig Start Date End Date Taking? Authorizing Provider  DM-APAP-CPM (CORICIDIN HBP PO) Take by mouth.   Yes [provider]  atorvastatin (LIPITOR) 40 MG tablet TAKE 1 TABLET BY MOUTH AT BEDTIME 06/04/20   [name], MD  Calcium Carbonate-Vitamin D (CALCIUM 600 + D PO) Take 1 tablet by mouth 2 (two) times daily.     [provider]  ciclopirox (LOPROX) 0.77 % cream Apply topically 2 (two) times daily. 06/12/20   [place]ark-[place], Anderson Malta, PA-C  diclofenac Sodium (VOLTAREN) 1 % GEL Apply 2 g topically 4 (four) times daily. 12/14/19   [name], MD  enalapril (VASOTEC) 5 MG tablet Take 1 tablet (5 mg total) by mouth daily. 11/21/19  Jerline Pain, MD  EUTHYROX 75 MCG tablet Take 1 tablet by mouth once daily 05/08/20   [name], MD  fluorometholone (FML) 0.1 % ophthalmic suspension Place 1 drop into both eyes once a week.  10/20/16   [provider]  Lancets [name] ULTRASOFT) lancets Use as instructed 02/02/19   [name], MD  latanoprost Ivin Poot) 0.005 % ophthalmic solution  01/14/17   [provider]  metFORMIN (GLUCOPHAGE) 1000 MG tablet Take 1 tablet by mouth twice daily 06/09/20   [name], MD  metoprolol tartrate (LOPRESSOR) 100 MG tablet Take 1 tablet by mouth twice daily 06/04/20   [name], MD  Multiple  Vitamin (MULTIVITAMIN) capsule Take 1 capsule by mouth daily.      [provider]  nystatin (MYCOSTATIN/NYSTOP) powder Apply topically 3 (three) times daily. 10/27/17   [name], MD  Omega-3 Fatty Acids (FISH OIL) 1200 MG CAPS Take 3 capsules by mouth daily.    [provider]  ONETOUCH VERIO test strip USE 1 STRIP TO CHECK GLUCOSE TWICE DAILY AS DIRECTED 09/27/19   [name], MD  SF 5000 PLUS 1.1 % CREA dental cream APPLY AT LEAST A 1 INCH STRIP ONTO A SOFT BRISTLE TOOTHBRUSH AND BRUSH TEETH THOROUGHLY FOR AT LEAST 2 MINUTES 11/15/19   [provider]  SUPREP BOWEL PREP KIT 17.5-3.13-1.6 GM/177ML SOLN Take 1 kit by mouth as directed. For colonoscopy prep 11/01/18   Pyrtle, Lajuan Lines, MD  timolol (TIMOPTIC) 0.5 % ophthalmic solution Apply to eye. 10/19/16   [provider]  warfarin (COUMADIN) 2.5 MG tablet TAKE AS DIRECTED BY  COUMADIN  CLINIC 02/11/20   Jerline Pain, MD    Family History Family History  Problem Relation Age of Onset  . Heart failure Mother   . Heart failure Father   . Stomach cancer Neg Hx   . Colon cancer Neg Hx   . Pancreatic cancer Neg Hx   . Throat cancer Neg Hx     Social History Social History   Tobacco Use  . Smoking status: Never Smoker  . Smokeless tobacco: Never Used  Vaping Use  . Vaping Use: Never used  Substance Use Topics  . Alcohol use: Yes    Alcohol/week: 0.0 standard drinks    Comment: Very seldom---socially  . Drug use: No     Allergies   Neomycin-bacitracin zn-polymyx, Penicillins, and Other   Review of Systems Review of Systems  Constitutional: Negative for chills and fever.  HENT: Negative for ear pain and sore throat.   Eyes: Negative for pain and visual disturbance.  Respiratory: Positive for cough. Negative for shortness of breath.   Cardiovascular: Negative for chest pain and palpitations.  Gastrointestinal: Negative for abdominal pain and vomiting.  Genitourinary: Negative  for dysuria and hematuria.  Musculoskeletal: Negative for arthralgias and back pain.  Skin: Negative for color change and rash.  Neurological: Negative for seizures and syncope.  All other systems reviewed and are negative.    Physical Exam Triage Vital Signs ED Triage Vitals  Enc Vitals Group     BP      Pulse      Resp      Temp      Temp src      SpO2      Weight      Height      Head Circumference      Peak Flow      Pain  Score      Pain Loc      Pain Edu?      Excl. in [place]?    No data found.  Updated Vital Signs BP (!) 152/82 (BP Location: Right Arm)   Pulse 76   Temp 98 F (36.7 C) (Oral)   Resp (!) 26   SpO2 96%   Visual Acuity Right Eye Distance:   Left Eye Distance:   Bilateral Distance:    Right Eye Near:   Left Eye Near:    Bilateral Near:     Physical Exam Vitals and nursing note reviewed.  Constitutional:      General: He is not in acute distress.    Appearance: He is well-developed.  HENT:     Head: Normocephalic and atraumatic.     Mouth/Throat:     Mouth: Mucous membranes are moist.  Eyes:     Conjunctiva/sclera: Conjunctivae normal.  Cardiovascular:     Rate and Rhythm: Normal rate and regular rhythm.     Heart sounds: No murmur heard.   Pulmonary:     Effort: Pulmonary effort is normal. No respiratory distress.     Breath sounds: Normal breath sounds. No wheezing or rhonchi.  Abdominal:     Palpations: Abdomen is soft.     Tenderness: There is no abdominal tenderness.  Musculoskeletal:     Cervical back: Neck supple.  Skin:    General: Skin is warm and dry.     Findings: No rash.  Neurological:     General: No focal deficit present.     Mental Status: He is alert and oriented to person, place, and time.     Gait: Gait normal.  Psychiatric:        Mood and Affect: Mood normal.        Behavior: Behavior normal.      UC Treatments / Results  Labs (all labs ordered are listed, but only abnormal results are  displayed) Labs Reviewed - No data to display  EKG   Radiology DG Chest 2 View  Result Date: 07/06/2020 CLINICAL DATA:  Cough. COVID positive 9 days ago. Shortness of breath. EXAM: CHEST - 2 VIEW COMPARISON:  Rib radiographs 06/26/2018 FINDINGS: Mild cardiomegaly. Normal mediastinal contours. Aortic atherosclerosis. Coronary artery calcifications versus stent. Patchy heterogeneous bilateral airspace opacities in a mid-lower lung zone predominant distribution. No pneumothorax or evidence of pneumomediastinum. No significant pleural effusion. Degenerative change of the shoulders. IMPRESSION: Patchy heterogeneous bilateral airspace opacities in a mid-lower lung zone predominant distribution, consistent with COVID-19 pneumonia. Electronically Signed   By: Keith Rake M.D.   On: 07/06/2020 18:46    Procedures Procedures (including critical care time)  Medications Ordered in UC Medications - No data to display  Initial Impression / Assessment and Plan / UC Course  I have reviewed the triage vital signs and the nursing notes.  Pertinent labs & imaging results that were available during my care of the patient were reviewed by me and considered in my medical decision making (see chart for details).    Pneumonia due to COVID-19.  Patient tested COVID positive on 06/25/2020.   Xray shows " patchy heterogeneous bilateral airspace opacities in a mid-lower lung zone predominant distribution consistent with COVID-19 pneumonia."  Due to patient's age and medical comorbidities, sending patient to the ED for evaluation.  Patient and his daughter readily agreed to this plan of care.  They declined EMS and felt comfortable with his daughter driving him to the ED.  Final Clinical Impressions(s) / UC Diagnoses   Final diagnoses:  Pneumonia due to COVID-19 virus  COVID-19     Discharge Instructions     You have COVID pneumonia.  Go to the Emergency Department for evaluation.        ED  Prescriptions    None     PDMP not reviewed this encounter.   Sharion Balloon, NP 07/06/20 1900

## 2020-07-06 NOTE — ED Notes (Signed)
Patient is being discharged from the Urgent Care and sent to the Emergency Department via private vehicle. Per kelly, np, patient is in need of higher level of care due to covid, bilateral covid pneumonia. Patient is aware and verbalizes understanding of plan of care.  Vitals:   07/06/20 1846  BP: (!) 152/82  Pulse: 76  Resp: (!) 26  Temp: 98 F (36.7 C)  SpO2: 96%

## 2020-07-06 NOTE — ED Triage Notes (Addendum)
Pt reports that he went to UC today for his cough, has been covid + on 9/15, they did xray and told him he had pneumonia and sent him here, pt is vaccinated

## 2020-07-06 NOTE — Discharge Instructions (Signed)
You have COVID pneumonia.  Go to the Emergency Department for evaluation.

## 2020-07-07 DIAGNOSIS — Z7984 Long term (current) use of oral hypoglycemic drugs: Secondary | ICD-10-CM | POA: Diagnosis not present

## 2020-07-07 DIAGNOSIS — I4891 Unspecified atrial fibrillation: Secondary | ICD-10-CM | POA: Diagnosis not present

## 2020-07-07 DIAGNOSIS — I2583 Coronary atherosclerosis due to lipid rich plaque: Secondary | ICD-10-CM | POA: Diagnosis present

## 2020-07-07 DIAGNOSIS — Z79899 Other long term (current) drug therapy: Secondary | ICD-10-CM | POA: Diagnosis not present

## 2020-07-07 DIAGNOSIS — M1990 Unspecified osteoarthritis, unspecified site: Secondary | ICD-10-CM | POA: Diagnosis not present

## 2020-07-07 DIAGNOSIS — H409 Unspecified glaucoma: Secondary | ICD-10-CM | POA: Diagnosis present

## 2020-07-07 DIAGNOSIS — Z7901 Long term (current) use of anticoagulants: Secondary | ICD-10-CM | POA: Diagnosis not present

## 2020-07-07 DIAGNOSIS — E119 Type 2 diabetes mellitus without complications: Secondary | ICD-10-CM | POA: Diagnosis not present

## 2020-07-07 DIAGNOSIS — Z79890 Hormone replacement therapy (postmenopausal): Secondary | ICD-10-CM | POA: Diagnosis not present

## 2020-07-07 DIAGNOSIS — J1282 Pneumonia due to coronavirus disease 2019: Secondary | ICD-10-CM | POA: Diagnosis not present

## 2020-07-07 DIAGNOSIS — J9600 Acute respiratory failure, unspecified whether with hypoxia or hypercapnia: Secondary | ICD-10-CM

## 2020-07-07 DIAGNOSIS — E039 Hypothyroidism, unspecified: Secondary | ICD-10-CM | POA: Diagnosis present

## 2020-07-07 DIAGNOSIS — I4510 Unspecified right bundle-branch block: Secondary | ICD-10-CM | POA: Diagnosis not present

## 2020-07-07 DIAGNOSIS — I10 Essential (primary) hypertension: Secondary | ICD-10-CM | POA: Diagnosis not present

## 2020-07-07 DIAGNOSIS — Z881 Allergy status to other antibiotic agents status: Secondary | ICD-10-CM | POA: Diagnosis not present

## 2020-07-07 DIAGNOSIS — Z8249 Family history of ischemic heart disease and other diseases of the circulatory system: Secondary | ICD-10-CM | POA: Diagnosis not present

## 2020-07-07 DIAGNOSIS — I2510 Atherosclerotic heart disease of native coronary artery without angina pectoris: Secondary | ICD-10-CM | POA: Diagnosis not present

## 2020-07-07 DIAGNOSIS — Z888 Allergy status to other drugs, medicaments and biological substances status: Secondary | ICD-10-CM | POA: Diagnosis not present

## 2020-07-07 DIAGNOSIS — Z880 Allergy status to penicillin: Secondary | ICD-10-CM | POA: Diagnosis not present

## 2020-07-07 DIAGNOSIS — U071 COVID-19: Principal | ICD-10-CM | POA: Diagnosis present

## 2020-07-07 DIAGNOSIS — A0839 Other viral enteritis: Secondary | ICD-10-CM | POA: Diagnosis not present

## 2020-07-07 DIAGNOSIS — E785 Hyperlipidemia, unspecified: Secondary | ICD-10-CM | POA: Diagnosis not present

## 2020-07-07 DIAGNOSIS — Z955 Presence of coronary angioplasty implant and graft: Secondary | ICD-10-CM | POA: Diagnosis not present

## 2020-07-07 HISTORY — DX: Acute respiratory failure, unspecified whether with hypoxia or hypercapnia: J96.00

## 2020-07-07 HISTORY — DX: COVID-19: U07.1

## 2020-07-07 LAB — CBG MONITORING, ED
Glucose-Capillary: 134 mg/dL — ABNORMAL HIGH (ref 70–99)
Glucose-Capillary: 227 mg/dL — ABNORMAL HIGH (ref 70–99)
Glucose-Capillary: 264 mg/dL — ABNORMAL HIGH (ref 70–99)
Glucose-Capillary: 196 mg/dL — ABNORMAL HIGH (ref 70–99)
Glucose-Capillary: 149 mg/dL — ABNORMAL HIGH (ref 70–99)

## 2020-07-07 LAB — HEPATIC FUNCTION PANEL
ALT: 89 U/L — ABNORMAL HIGH (ref 0–44)
AST: 78 U/L — ABNORMAL HIGH (ref 15–41)
Albumin: 2.5 g/dL — ABNORMAL LOW (ref 3.5–5.0)
Alkaline Phosphatase: 55 U/L (ref 38–126)
Bilirubin, Direct: 0.6 mg/dL — ABNORMAL HIGH (ref 0.0–0.2)
Indirect Bilirubin: 0.9 mg/dL (ref 0.3–0.9)
Total Bilirubin: 1.5 mg/dL — ABNORMAL HIGH (ref 0.3–1.2)
Total Protein: 6.4 g/dL — ABNORMAL LOW (ref 6.5–8.1)

## 2020-07-07 LAB — LACTIC ACID, PLASMA
Lactic Acid, Venous: 1.2 mmol/L (ref 0.5–1.9)
Lactic Acid, Venous: 1.1 mmol/L (ref 0.5–1.9)

## 2020-07-07 LAB — RESPIRATORY PANEL BY RT PCR (FLU A&B, COVID)
Influenza A by PCR: NEGATIVE
Influenza B by PCR: NEGATIVE
SARS Coronavirus 2 by RT PCR: POSITIVE — AB

## 2020-07-07 LAB — HEMOGLOBIN A1C
Hgb A1c MFr Bld: 6.8 % — ABNORMAL HIGH (ref 4.8–5.6)
Mean Plasma Glucose: 148.46 mg/dL

## 2020-07-07 LAB — PROTIME-INR
INR: 3.8 — ABNORMAL HIGH (ref 0.8–1.2)
Prothrombin Time: 36.1 seconds — ABNORMAL HIGH (ref 11.4–15.2)

## 2020-07-07 LAB — FIBRINOGEN: Fibrinogen: 647 mg/dL — ABNORMAL HIGH (ref 210–475)

## 2020-07-07 LAB — TRIGLYCERIDES: Triglycerides: 74 mg/dL (ref <150)

## 2020-07-07 LAB — D-DIMER, QUANTITATIVE: D-Dimer, Quant: 1.15 ug/mL-FEU — ABNORMAL HIGH (ref 0.00–0.50)

## 2020-07-07 LAB — LACTATE DEHYDROGENASE: LDH: 201 U/L — ABNORMAL HIGH (ref 98–192)

## 2020-07-07 LAB — C-REACTIVE PROTEIN: CRP: 13.6 mg/dL — ABNORMAL HIGH (ref <1.0)

## 2020-07-07 LAB — PROCALCITONIN: Procalcitonin: <0.1 ng/mL

## 2020-07-07 LAB — FERRITIN: Ferritin: 467 ng/mL — ABNORMAL HIGH (ref 24–336)

## 2020-07-07 MED ORDER — INSULIN ASPART 100 UNIT/ML ~~LOC~~ SOLN
0.0000 [IU] | Freq: Three times a day (TID) | SUBCUTANEOUS | Status: DC
  Administered 2020-07-07: 8 [IU] via SUBCUTANEOUS
  Administered 2020-07-07 – 2020-07-08 (×4): 3 [IU] via SUBCUTANEOUS

## 2020-07-07 MED ORDER — GUAIFENESIN-DM 100-10 MG/5ML PO SYRP
10.0000 mL | ORAL_SOLUTION | ORAL | Status: DC | PRN
  Administered 2020-07-07: 10 mL via ORAL
  Filled 2020-07-07: qty 10

## 2020-07-07 MED ORDER — INSULIN ASPART 100 UNIT/ML ~~LOC~~ SOLN: 0.0000 [IU] | Freq: Every day | SUBCUTANEOUS | Status: DC

## 2020-07-07 MED ORDER — DEXAMETHASONE SODIUM PHOSPHATE 10 MG/ML IJ SOLN
6.0000 mg | INTRAMUSCULAR | Status: DC
  Administered 2020-07-07 – 2020-07-08 (×2): 6 mg via INTRAVENOUS
  Filled 2020-07-07 (×2): qty 1

## 2020-07-07 MED ORDER — SODIUM CHLORIDE 0.9 % IV SOLN
200.0000 mg | Freq: Once | INTRAVENOUS | Status: AC
  Administered 2020-07-07: 200 mg via INTRAVENOUS
  Filled 2020-07-07: qty 40

## 2020-07-07 MED ORDER — LEVOTHYROXINE SODIUM 75 MCG PO TABS
75.0000 ug | ORAL_TABLET | Freq: Every day | ORAL | Status: DC
  Administered 2020-07-07 – 2020-07-08 (×2): 75 ug via ORAL
  Filled 2020-07-07 (×2): qty 1

## 2020-07-07 MED ORDER — IPRATROPIUM-ALBUTEROL 20-100 MCG/ACT IN AERS
1.0000 | INHALATION_SPRAY | Freq: Four times a day (QID) | RESPIRATORY_TRACT | Status: DC
  Administered 2020-07-07 (×2): 1 via RESPIRATORY_TRACT
  Filled 2020-07-07: qty 4

## 2020-07-07 MED ORDER — IPRATROPIUM-ALBUTEROL 20-100 MCG/ACT IN AERS
1.0000 | INHALATION_SPRAY | Freq: Two times a day (BID) | RESPIRATORY_TRACT | Status: DC
  Administered 2020-07-07 – 2020-07-08 (×2): 1 via RESPIRATORY_TRACT
  Filled 2020-07-07: qty 4

## 2020-07-07 MED ORDER — SODIUM CHLORIDE 0.9 % IV SOLN: 100.0000 mg | Freq: Every day | INTRAVENOUS | Status: DC

## 2020-07-07 MED ORDER — BARICITINIB 2 MG PO TABS
2.0000 mg | ORAL_TABLET | Freq: Every day | ORAL | Status: DC
  Administered 2020-07-07: 2 mg via ORAL
  Filled 2020-07-07: qty 1

## 2020-07-07 MED ORDER — ONDANSETRON HCL 4 MG/2ML IJ SOLN: 4.0000 mg | Freq: Four times a day (QID) | INTRAMUSCULAR | Status: DC | PRN

## 2020-07-07 MED ORDER — ATORVASTATIN CALCIUM 40 MG PO TABS
40.0000 mg | ORAL_TABLET | Freq: Every day | ORAL | Status: DC
  Administered 2020-07-07 (×2): 40 mg via ORAL
  Filled 2020-07-07 (×2): qty 1

## 2020-07-07 MED ORDER — LATANOPROST 0.005 % OP SOLN
1.0000 [drp] | Freq: Every day | OPHTHALMIC | Status: DC
  Filled 2020-07-07: qty 2.5

## 2020-07-07 MED ORDER — METOPROLOL TARTRATE 100 MG PO TABS
100.0000 mg | ORAL_TABLET | Freq: Two times a day (BID) | ORAL | Status: DC
  Administered 2020-07-07 – 2020-07-08 (×3): 100 mg via ORAL
  Filled 2020-07-07: qty 1
  Filled 2020-07-07 (×2): qty 4

## 2020-07-07 MED ORDER — ADULT MULTIVITAMIN W/MINERALS CH
1.0000 | ORAL_TABLET | Freq: Every day | ORAL | Status: DC
  Administered 2020-07-07 – 2020-07-08 (×2): 1 via ORAL
  Filled 2020-07-07 (×2): qty 1

## 2020-07-07 MED ORDER — FLUOROMETHOLONE 0.1 % OP SUSP: 1.0000 [drp] | OPHTHALMIC | Status: DC

## 2020-07-07 MED ORDER — SODIUM CHLORIDE 0.9 % IV SOLN
100.0000 mg | Freq: Every day | INTRAVENOUS | Status: DC
  Administered 2020-07-08: 100 mg via INTRAVENOUS
  Filled 2020-07-07 (×2): qty 20

## 2020-07-07 MED ORDER — DICLOFENAC SODIUM 1 % EX GEL
2.0000 g | Freq: Four times a day (QID) | CUTANEOUS | Status: DC | PRN
  Filled 2020-07-07: qty 100

## 2020-07-07 MED ORDER — ENALAPRIL MALEATE 5 MG PO TABS
5.0000 mg | ORAL_TABLET | Freq: Every day | ORAL | Status: DC
  Administered 2020-07-07 – 2020-07-08 (×2): 5 mg via ORAL
  Filled 2020-07-07 (×2): qty 1

## 2020-07-07 MED ORDER — ONDANSETRON HCL 4 MG PO TABS: 4.0000 mg | ORAL_TABLET | Freq: Four times a day (QID) | ORAL | Status: DC | PRN

## 2020-07-07 MED ORDER — HYDROCOD POLST-CPM POLST ER 10-8 MG/5ML PO SUER: 5.0000 mL | Freq: Two times a day (BID) | ORAL | Status: DC | PRN

## 2020-07-07 MED ORDER — LINAGLIPTIN 5 MG PO TABS
5.0000 mg | ORAL_TABLET | Freq: Every day | ORAL | Status: DC
  Administered 2020-07-07 – 2020-07-08 (×2): 5 mg via ORAL
  Filled 2020-07-07 (×2): qty 1

## 2020-07-07 MED ORDER — OMEGA-3-ACID ETHYL ESTERS 1 G PO CAPS
1.0000 g | ORAL_CAPSULE | Freq: Every day | ORAL | Status: DC
  Administered 2020-07-07 – 2020-07-08 (×2): 1 g via ORAL
  Filled 2020-07-07 (×3): qty 1

## 2020-07-07 MED ORDER — WARFARIN - PHARMACIST DOSING INPATIENT: Freq: Every day | Status: DC

## 2020-07-07 MED ORDER — TIMOLOL MALEATE 0.5 % OP SOLN
1.0000 [drp] | Freq: Every day | OPHTHALMIC | Status: DC
  Administered 2020-07-07 – 2020-07-08 (×2): 1 [drp] via OPHTHALMIC
  Filled 2020-07-07: qty 5

## 2020-07-07 MED ORDER — SODIUM CHLORIDE 0.9 % IV SOLN: 200.0000 mg | Freq: Once | INTRAVENOUS | Status: DC

## 2020-07-07 MED ORDER — INSULIN DETEMIR 100 UNIT/ML ~~LOC~~ SOLN
0.1500 [IU]/kg | Freq: Two times a day (BID) | SUBCUTANEOUS | Status: DC
  Administered 2020-07-07 – 2020-07-08 (×3): 16 [IU] via SUBCUTANEOUS
  Filled 2020-07-07 (×5): qty 0.16

## 2020-07-07 MED ORDER — ACETAMINOPHEN 325 MG PO TABS: 650.0000 mg | ORAL_TABLET | Freq: Four times a day (QID) | ORAL | Status: DC | PRN

## 2020-07-07 NOTE — Progress Notes (Addendum)
PROGRESS NOTE    Marvin Foster  AYT:[id number] DOB: [birth year] DOA: 07/06/2020 PCP: Sheliah Hatch, MD   Chief Complaint  Patient presents with  . COVID    Brief Narrative:  Marvin Foster is Marvin Foster 86 y.o. male with medical history significant of atrial fibrillation, coronary artery disease, diabetes, hyperlipidemia, hypertension, spinal stenosis, diverticular disease, corneal transplant surgery who was diagnosed with COVID-19 at urgent care about 11 days ago.  Patient lost his son recently with COVID-19 and has been exposed.  He started having shortness of breath and cough prior to going to urgent care.  Mainly cough body aches and intermittent diarrhea.  At that point no chest pain and no vomiting.  Today he went back to urgent care where he was evaluated with chest x-ray showing pneumonia.  Patient has new oxygen requirement and he is [name] full sent over here where he is being admitted to the hospital for treatment.  He does have irritation feeling like foreign body in the left eye for the last few days.  With his prior history of corneal surgery evaluation done in the ER showing no evidence of tear or ulcers.  Visual changes were also not significantly seen.  Patient has an eye ointment initiated and being admitted to the hospital for treatment of his Covid pneumonia..  ED Course: Temperature 101.2 blood pressure 162/92 pulse 113 respiratory 29 oxygen sats 95% on room air.  Sodium 136 potassium 5.1 chloride 101 CO2 24 glucose is 138 BUN 12 creatinine 0.94 calcium 8.7.  White count is 7.6 hemoglobin 12.1 platelets 207.  PT 13.5 INR 3.4 with glucose again 138.  Chest x-ray showed patchy bilateral airspace opacity in the mid and lower lung zone consistent with COVID-19 pneumonia.  Patient initiated on treatment and being admitted to the hospital for further treatment  Assessment & Plan:   Principal Problem:   Acute respiratory failure due to COVID-19 [facility]) Active Problems:   Diabetes mellitus  type II, controlled (HCC)   Atrial fibrillation (HCC)   Cornea conical   Chronic anticoagulation   Hyperlipidemia   Coronary artery disease due to lipid rich plaque   Hypothyroid  Acute Hypoxic Resp Failure 2/2 COVID 19 Pneumonia Vaccinated CXR with patchy heterogenous bilateral airspace opacities in mid lower lung zone predominant distribution c/w covid 19 pneumonia Currently doing ok on RA, but desatting with ambulation Continue steroids, remdesivir.  D/c baricitinib given minimal oxygen requirement. Strict I/O, daily weights Prone as able, OOB, IS, flutter, therapy Consider d/c tomorrow and completion of remdesivir outpatient if he's doing better  COVID-19 Labs  Recent Labs    07/07/20 0006  DDIMER 1.15*  FERRITIN 467*  LDH 201*  CRP 13.6*    Lab Results  Component Value Date   SARSCOV2NAA POSITIVE (Marvin Foster) 07/07/2020   SARSCOV2NAA Not Detected 05/18/2019   SARSCOV2NAA Not Detected 03/20/2019   T2DM Basal, bolus regimen tradjenta   Atrial Fibrillation with RVR  Supratherapeutic INR Continue metop INR supratherapeutic, hold warfarin - pharmacy to dose  HLD Continue statin  Hypothyroidism Continue synthroid  Left Eye Abrasion? Continue topical eye ointment per ED, this sounds like [name] chronic issue, he gets injections with ophtho outpatient and has discussed this with them before.  Negative flourescein per EDP.  Right eye 20/100, L eye 20/50 per EDP note. Follow outpatient     DVT prophylaxis: warfarin  Code Status: full  Family Communication: none at bedside - 9/27 daughter Disposition:   Status is: Inpatient  Remains inpatient  appropriate because:Inpatient level of care appropriate due to severity of illness   Dispo: The patient is from: Home              Anticipated d/c is to: Home              Anticipated d/c date is: > 3 days              Patient currently is not medically stable to d/c.  Consultants:   none  Procedures:    none  Antimicrobials: Anti-infectives (From admission, onward)   Start     Dose/Rate Route Frequency Ordered Stop   07/08/20 1000  remdesivir 100 mg in sodium chloride 0.9 % 100 mL IVPB  Status:  Discontinued       "Followed by" Linked Group Details   100 mg 200 mL/hr over 30 Minutes Intravenous Daily 07/07/20 0130 07/07/20 0145   07/08/20 1000  remdesivir 100 mg in sodium chloride 0.9 % 100 mL IVPB       "Followed by" Linked Group Details   100 mg 200 mL/hr over 30 Minutes Intravenous Daily 07/07/20 0024 07/12/20 0959   07/07/20 0130  remdesivir 200 mg in sodium chloride 0.9% 250 mL IVPB  Status:  Discontinued       "Followed by" Linked Group Details   200 mg 580 mL/hr over 30 Minutes Intravenous Once 07/07/20 0130 07/07/20 0145   07/07/20 0130  remdesivir 200 mg in sodium chloride 0.9% 250 mL IVPB       "Followed by" Linked Group Details   200 mg 580 mL/hr over 30 Minutes Intravenous Once 07/07/20 0024 07/07/20 0254      Subjective: No new complaints  Objective: Vitals:   07/07/20 1200 07/07/20 1230 07/07/20 1500 07/07/20 1528  BP: 129/84 (!) 142/96 133/77   Pulse: 77 90 78   Resp: (!) 23 (!) 22 (!) 21   Temp:    98 F (36.7 C)  TempSrc:    Oral  SpO2: 94% 96% 95%   Weight:      Height:        Intake/Output Summary (Last 24 hours) at 07/07/2020 1630 Last data filed at 07/07/2020 0254 Gross per 24 hour  Intake 676.23 ml  Output --  Net 676.23 ml   Filed Weights   07/06/20 1918  Weight: 104.3 kg    Examination:  General: No acute distress. Cardiovascular: irregularly irregular  Lungs: Clear to auscultation bilaterally Abdomen: Soft, nontender, nondistended  Neurological: Alert and oriented 3. Moves all extremities 4. Cranial nerves II through XII grossly intact. Skin: Warm and dry. No rashes or lesions. Extremities: No clubbing or cyanosis. No edema.     Data Reviewed: I have personally reviewed following labs and imaging studies  CBC: Recent  Labs  Lab 07/06/20 2023  WBC 7.6  HGB 12.1*  HCT 37.3*  MCV 94.2  PLT 207    Basic Metabolic Panel: Recent Labs  Lab 07/06/20 2023  NA 136  K 5.1  CL 101  CO2 24  GLUCOSE 138*  BUN 12  CREATININE 0.94  CALCIUM 8.7*    GFR: Estimated Creatinine Clearance: 69.6 mL/min (by C-G formula based on SCr of 0.94 mg/dL).  Liver Function Tests: Recent Labs  Lab 07/07/20 0006  AST 78*  ALT 89*  ALKPHOS 55  BILITOT 1.5*  PROT 6.4*  ALBUMIN 2.5*    CBG: Recent Labs  Lab 07/07/20 0225 07/07/20 0725 07/07/20 1106  GLUCAP 134* 227* 264*  Recent Results (from the past 240 hour(s))  Respiratory Panel by RT PCR (Flu Marvin Foster&B, Covid) - Nasopharyngeal Swab     Status: Abnormal   Collection Time: 07/07/20 12:06 AM   Specimen: Nasopharyngeal Swab  Result Value Ref Range Status   SARS Coronavirus 2 by RT PCR POSITIVE (Marvin Foster) NEGATIVE Final    Comment: RESULT CALLED TO, READ BACK BY AND VERIFIED WITH: H. LINDSEY,RN 0301 07/07/2020 T. TYSOR (NOTE) SARS-CoV-2 target nucleic acids are DETECTED.  SARS-CoV-2 RNA is generally detectable in upper respiratory specimens  during the acute phase of infection. Positive results are indicative of the presence of the identified virus, but do not rule out bacterial infection or co-infection with other pathogens not detected by the test. Clinical correlation with patient history and other diagnostic information is necessary to determine patient infection status. The expected result is Negative.  Fact Sheet for Patients:  https://www.moore.com/  Fact Sheet for Healthcare Providers: https://www.young.biz/  This test is not yet approved or cleared by the [place] FDA and  has been authorized for detection and/or diagnosis of SARS-CoV-2 by FDA under an Emergency Use Authorization (EUA).  This EUA will remain in effect (meaning this test can  be used) for the duration of  the COVID-19 declaration  under Section 564(b)(1) of the Act, 21 U.S.C. section 360bbb-3(b)(1), unless the authorization is terminated or revoked sooner.      Influenza [place] by PCR NEGATIVE NEGATIVE Final   Influenza B by PCR NEGATIVE NEGATIVE Final    Comment: (NOTE) The Xpert Xpress SARS-CoV-2/FLU/RSV assay is intended as an aid in  the diagnosis of influenza from Nasopharyngeal swab specimens and  should not be used as Marvin Foster sole basis for treatment. Nasal washings and  aspirates are unacceptable for Xpert Xpress SARS-CoV-2/FLU/RSV  testing.  Fact Sheet for Patients: https://www.moore.com/  Fact Sheet for Healthcare Providers: https://www.young.biz/  This test is not yet approved or cleared by the [place] FDA and  has been authorized for detection and/or diagnosis of SARS-CoV-2 by  FDA under an Emergency Use Authorization (EUA). This EUA will remain  in effect (meaning this test can be used) for the duration of the  Covid-19 declaration under Section 564(b)(1) of the Act, 21  U.S.C. section 360bbb-3(b)(1), unless the authorization is  terminated or revoked. Performed at [facility], [address], [place], [place] [address]          Radiology Studies: DG Chest 2 View  Result Date: 07/06/2020 CLINICAL DATA:  Cough. COVID positive 9 days ago. Shortness of breath. EXAM: CHEST - 2 VIEW COMPARISON:  Rib radiographs 06/26/2018 FINDINGS: Mild cardiomegaly. Normal mediastinal contours. Aortic atherosclerosis. Coronary artery calcifications versus stent. Patchy heterogeneous bilateral airspace opacities in [place] mid-lower lung zone predominant distribution. No pneumothorax or evidence of pneumomediastinum. No significant pleural effusion. Degenerative change of the shoulders. IMPRESSION: Patchy heterogeneous bilateral airspace opacities in [place] mid-lower lung zone predominant distribution, consistent with COVID-19 pneumonia. Electronically Signed   By: Marvin Foster  M.D.   On: 07/06/2020 18:46        Scheduled Meds: . atorvastatin  40 mg Oral QHS  . baricitinib  2 mg Oral Daily  . dexamethasone (DECADRON) injection  6 mg Intravenous Q24H  . enalapril  5 mg Oral Daily  . erythromycin  1 application Left Eye Q6H  . insulin aspart  0-15 Units Subcutaneous TID WC  . insulin aspart  0-5 Units Subcutaneous QHS  . insulin detemir  0.15 Units/kg Subcutaneous BID  .  Ipratropium-Albuterol  1 puff Inhalation BID  . latanoprost  1 drop Both Eyes QHS  . levothyroxine  75 mcg Oral Daily  . linagliptin  5 mg Oral Daily  . metoprolol tartrate  100 mg Oral BID  . multivitamin with minerals  1 tablet Oral Daily  . omega-3 acid ethyl esters  1 g Oral Daily  . timolol  1 drop Both Eyes Daily   Continuous Infusions: . [START ON 07/08/2020] remdesivir 100 mg in NS 100 mL       LOS: 0 days    Time spent: over 30 min    Lacretia Nicks, MD [facility]   To contact the attending provider between 7A-7P or the covering provider during after hours 7P-7A, please log into the web site [email or web address] and access using universal [place] password for that web site. If you do not have the password, please call the hospital operator.  07/07/2020, 4:30 PM

## 2020-07-07 NOTE — H&P (Addendum)
History and Physical   TERRENCE WISHON VFI:[id number] DOB: 09/05/1934 DOA: 07/06/2020  Referring MD/NP/PA: Dr. Billy Fischer  PCP: [name], MD   Patient coming from: Home  Chief Complaint: Shortness of breath and cough  HPI: Marvin Foster is a 86 y.o. male with medical history significant of atrial fibrillation, coronary artery disease, diabetes, hyperlipidemia, hypertension, spinal stenosis, diverticular disease, corneal transplant surgery who was diagnosed with COVID-19 at urgent care about 11 days ago.  Patient lost his son recently with COVID-19 and has been exposed.  He started having shortness of breath and cough prior to going to urgent care.  Mainly cough body aches and intermittent diarrhea.  At that point no chest pain and no vomiting.  Today he went back to urgent care where he was evaluated with chest x-ray showing pneumonia.  Patient has new oxygen requirement and he is a full sent over here where he is being admitted to the hospital for treatment.  He does have irritation feeling like foreign body in the left eye for the last few days.  With his prior history of corneal surgery evaluation done in the ER showing no evidence of tear or ulcers.  Visual changes were also not significantly seen.  Patient has an eye ointment initiated and being admitted to the hospital for treatment of his Covid pneumonia..  ED Course: Temperature 101.2 blood pressure 162/92 pulse 113 respiratory 29 oxygen sats 95% on room air.  Sodium 136 potassium 5.1 chloride 101 CO2 24 glucose is 138 BUN 12 creatinine 0.94 calcium 8.7.  White count is 7.6 hemoglobin 12.1 platelets 207.  PT 13.5 INR 3.4 with glucose again 138.  Chest x-ray showed patchy bilateral airspace opacity in the mid and lower lung zone consistent with COVID-19 pneumonia.  Patient initiated on treatment and being admitted to the hospital for further treatment  Review of Systems: As per HPI otherwise 10 point review of systems negative.     Past Medical History:  Diagnosis Date  . Arthritis 12/2005   left hand  . Arthritis    Left knee  . Blood transfusion without reported diagnosis   . Bursitis of knee    right knee  . CAD (coronary artery disease)   . Diabetes mellitus   . Diverticulosis   . Glaucoma    left eye  . Hyperlipidemia   . Hypertension   . Neuropathy    with pain  . Spinal stenosis 01/20/05  . Tubular adenoma of colon     Past Surgical History:  Procedure Laterality Date  . BACK SURGERY    . CATARACT EXTRACTION  08/11/09   right  . corneal endothelial Left 2013  . CORNEAL TRANSPLANT Left    eye  . CORONARY ANGIOPLASTY WITH STENT PLACEMENT    . EYE SURGERY    . SMALL INTESTINE SURGERY    . SPINE SURGERY    . VASECTOMY       reports that he has never smoked. He has never used smokeless tobacco. He reports current alcohol use. He reports that he does not use drugs.  Allergies  Allergen Reactions  . Neomycin-Bacitracin Zn-Polymyx Rash  . Penicillins Other (See Comments) and Rash    Pt states this was diagnosed years ago, unsure as to reaction. Will not use.   . Other Rash    Family History  Problem Relation Age of Onset  . Heart failure Mother   . Heart failure Father   . Stomach cancer Neg Hx   .  Colon cancer Neg Hx   . Pancreatic cancer Neg Hx   . Throat cancer Neg Hx      Prior to Admission medications   Medication Sig Start Date End Date Taking? Authorizing Provider  atorvastatin (LIPITOR) 40 MG tablet TAKE 1 TABLET BY MOUTH AT BEDTIME Patient taking differently: Take 40 mg by mouth at bedtime.  06/04/20  Yes [name], MD  Calcium Carbonate-Vitamin D (CALCIUM 600 + D PO) Take 1 tablet by mouth 2 (two) times daily.    Yes [provider]  diclofenac Sodium (VOLTAREN) 1 % GEL Apply 2 g topically 4 (four) times daily. Patient taking differently: Apply 2 g topically 4 (four) times daily as needed (pain).  12/14/19  Yes [name], MD  enalapril  (VASOTEC) 5 MG tablet Take 1 tablet (5 mg total) by mouth daily. 11/21/19  Yes Jerline Pain, MD  EUTHYROX 75 MCG tablet Take 1 tablet by mouth once daily 05/08/20  Yes [name], MD  fluorometholone (FML) 0.1 % ophthalmic suspension Place 1 drop into both eyes every 30 (thirty) days.  10/20/16  Yes [provider]  latanoprost (XALATAN) 0.005 % ophthalmic solution Place 1 drop into both eyes at bedtime.  01/14/17  Yes [provider]  metFORMIN (GLUCOPHAGE) 1000 MG tablet Take 1 tablet by mouth twice daily Patient taking differently: Take 1,000 mg by mouth 2 (two) times daily with a meal.  06/09/20  Yes [name], MD  metoprolol tartrate (LOPRESSOR) 100 MG tablet Take 1 tablet by mouth twice daily Patient taking differently: Take 100 mg by mouth 2 (two) times daily.  06/04/20  Yes [name], MD  Multiple Vitamin (MULTIVITAMIN) capsule Take 1 capsule by mouth daily.     Yes [provider]  Omega-3 Fatty Acids (FISH OIL) 1200 MG CAPS Take 3 capsules by mouth daily.   Yes [provider]  timolol (TIMOPTIC) 0.5 % ophthalmic solution Place 1 drop into both eyes daily.  10/19/16  Yes [provider]  warfarin (COUMADIN) 2.5 MG tablet TAKE AS DIRECTED BY  COUMADIN  CLINIC Patient taking differently: Take 2.5-3.75 mg by mouth See admin instructions. TAKE AS  DIRECTED BY COUMADIN  CLINIC Wed 3.75 mg and all other days 2.5 mg daily 02/11/20  Yes Jerline Pain, MD  Lancets [facility] ULTRASOFT) lancets Use as instructed 02/02/19   [name], MD  [facility] VERIO test strip USE 1 STRIP TO CHECK GLUCOSE TWICE DAILY AS DIRECTED 09/27/19   [name], MD    Physical Exam: Vitals:   07/06/20 1918 07/06/20 2147 07/06/20 2207 07/06/20 2215  BP: (!) 162/92  (!) 152/116 (!) 155/73  Pulse: 63  (!) 113 (!) 101  Resp: 18   (!) 29  Temp: 98.8 F (37.1 C) (!) 101.2 F (38.4 C)    TempSrc: Oral Oral    SpO2: 95%   97%  Weight:  104.3 kg     Height: 5\' 9"  (1.753 m)         Constitutional: Acutely ill looking, weak, no distress Vitals:   07/06/20 1918 07/06/20 2147 07/06/20 2207 07/06/20 2215  BP: (!) 162/92  (!) 152/116 (!) 155/73  Pulse: 63  (!) 113 (!) 101  Resp: 18   (!) 29  Temp: 98.8 F (37.1 C) (!) 101.2 F (38.4 C)    TempSrc: Oral Oral    SpO2: 95%   97%  Weight: 104.3 kg     Height:  5\' 9"  (1.753 m)      Eyes: PERRL, lids and left eye red, corneal inflammation, sensitive to light ENMT: Mucous membranes are dry. Posterior pharynx clear of any exudate or lesions.Normal dentition.  Neck: normal, supple, no masses, no thyromegaly Respiratory: Coarse breath sound bilaterally with some rhonchi, no wheeze,  no crackles. Normal respiratory effort. No accessory muscle use.  Cardiovascular: Irregularly irregular with tachycardia, no murmurs / rubs / gallops. No extremity edema. 2+ pedal pulses. No carotid bruits.  Abdomen: no tenderness, no masses palpated. No hepatosplenomegaly. Bowel sounds positive.  Musculoskeletal: no clubbing / cyanosis. No joint deformity upper and lower extremities. Good ROM, no contractures. Normal muscle tone.  Skin: no rashes, lesions, ulcers. No induration Neurologic: CN 2-12 grossly intact. Sensation intact, DTR normal. Strength 5/5 in all 4.  Psychiatric: Normal judgment and insight. Alert and oriented x 3. Normal mood.     Labs on Admission: I have personally reviewed following labs and imaging studies  CBC: Recent Labs  Lab 07/06/20 2023  WBC 7.6  HGB 12.1*  HCT 37.3*  MCV 94.2  PLT 401   Basic Metabolic Panel: Recent Labs  Lab 07/06/20 2023  NA 136  K 5.1  CL 101  CO2 24  GLUCOSE 138*  BUN 12  CREATININE 0.94  CALCIUM 8.7*   GFR: Estimated Creatinine Clearance: 69.6 mL/min (by C-G formula based on SCr of 0.94 mg/dL). Liver Function Tests: No results for input(s): AST, ALT, ALKPHOS, BILITOT, PROT, ALBUMIN in the last 168 hours. No results for  input(s): LIPASE, AMYLASE in the last 168 hours. No results for input(s): AMMONIA in the last 168 hours. Coagulation Profile: Recent Labs  Lab 07/06/20 2232  INR 3.4*   Cardiac Enzymes: No results for input(s): CKTOTAL, CKMB, CKMBINDEX, TROPONINI in the last 168 hours. BNP (last 3 results) No results for input(s): PROBNP in the last 8760 hours. HbA1C: No results for input(s): HGBA1C in the last 72 hours. CBG: No results for input(s): GLUCAP in the last 168 hours. Lipid Profile: No results for input(s): CHOL, HDL, LDLCALC, TRIG, CHOLHDL, LDLDIRECT in the last 72 hours. Thyroid Function Tests: No results for input(s): TSH, T4TOTAL, FREET4, T3FREE, THYROIDAB in the last 72 hours. Anemia Panel: No results for input(s): VITAMINB12, FOLATE, FERRITIN, TIBC, IRON, RETICCTPCT in the last 72 hours. Urine analysis:    Component Value Date/Time   COLORURINE yellow 12/26/2009 0746   APPEARANCEUR Clear 12/26/2009 0746   LABSPEC 1.020 12/26/2009 0746   PHURINE 6.0 12/26/2009 0746   HGBUR negative 12/26/2009 0746   BILIRUBINUR neg 01/16/2015 0953   PROTEINUR neg 01/16/2015 0953   UROBILINOGEN 0.2 01/16/2015 0953   UROBILINOGEN 0.2 12/26/2009 0746   NITRITE neg 01/16/2015 0953   NITRITE negative 12/26/2009 0746   LEUKOCYTESUR Negative 01/16/2015 0953   Sepsis Labs: @LABRCNTIP (procalcitonin:4,lacticidven:4) )No results found for this or any previous visit (from the past 240 hour(s)).   Radiological Exams on Admission: DG Chest 2 View  Result Date: 07/06/2020 CLINICAL DATA:  Cough. COVID positive 9 days ago. Shortness of breath. EXAM: CHEST - 2 VIEW COMPARISON:  Rib radiographs 06/26/2018 FINDINGS: Mild cardiomegaly. Normal mediastinal contours. Aortic atherosclerosis. Coronary artery calcifications versus stent. Patchy heterogeneous bilateral airspace opacities in a mid-lower lung zone predominant distribution. No pneumothorax or evidence of pneumomediastinum. No significant pleural  effusion. Degenerative change of the shoulders. IMPRESSION: Patchy heterogeneous bilateral airspace opacities in a mid-lower lung zone predominant distribution, consistent with COVID-19 pneumonia. Electronically Signed   By: Aurther Loft.D.  On: 07/06/2020 18:46    EKG: Independently reviewed.  Shows atrial fibrillation with a rate of 111.  Right bundle branch block.  No significant ST changes  Assessment/Plan Principal Problem:   Acute respiratory failure due to COVID-19 [facility]) Active Problems:   Diabetes mellitus type II, controlled ([place])   Atrial fibrillation (HCC)   Cornea conical   Chronic anticoagulation   Hyperlipidemia   Coronary artery disease due to lipid rich plaque   Hypothyroid     #1 acute respiratory failure secondary to COVID-19 pneumonia: Patient will be admitted to the hospital.  Initiate Covid pneumonia protocol with dexamethasone, remdesivir, oxygen, breathing treatments as well as supportive care.  Patient will be monitored closely.  Counseling given to patient especially with his history of losing his son here to Covid.  #2 diabetes: Sliding scale insulin with his home regimen.  #3 A. fib with RVR: Resume home regimen.  Patient on warfarin.  Continue  #4 hyperlipidemia: Resume home statin.  #5 hypothyroidism: Continue with the levothyroxine from home.  #6 left eye abrasion: Secondary to possible corneal irritation.  Agree with topical eye ointment.   DVT prophylaxis: Warfarin Code Status: Full code Family Communication: Wife over the phone Disposition Plan: Home Consults called: None Admission status: Inpatient  Severity of Illness: The appropriate patient status for this patient is INPATIENT. Inpatient status is judged to be reasonable and necessary in order to provide the required intensity of service to ensure the patient's safety. The patient's presenting symptoms, physical exam findings, and initial radiographic and laboratory data in the  context of their chronic comorbidities is felt to place them at high risk for further clinical deterioration. Furthermore, it is not anticipated that the patient will be medically stable for discharge from the hospital within 2 midnights of admission. The following factors support the patient status of inpatient.   " The patient's presenting symptoms include shortness of breath and weakness. " The worrisome physical exam findings include mild basilar crackles. " The initial radiographic and laboratory data are worrisome because of evidence of Covid pneumonia. " The chronic co-morbidities include atrial fibrillation with coronary artery disease.   * I certify that at the point of admission it is my clinical judgment that the patient will require inpatient hospital care spanning beyond 2 midnights from the point of admission due to high intensity of service, high risk for further deterioration and high frequency of surveillance required.Barbette Merino MD [facility] Pager [phone]  If 7PM-7AM, please contact night-coverage [email or web address] Password [facility]  07/07/2020, 12:16 AM

## 2020-07-07 NOTE — ED Notes (Signed)
hospitalist at bedside

## 2020-07-07 NOTE — ED Notes (Signed)
Date and time results received: 07/07/20 0300 (use smartphrase ".now" to insert current time)  Test: covid Critical Value: +  Name of Provider Notified: Marylene Buerger  Orders Received? Or Actions Taken?:provider made aware

## 2020-07-07 NOTE — ED Notes (Signed)
Pt placed on hospital bed for comfort.

## 2020-07-07 NOTE — ED Notes (Signed)
Marvin Foster [phone], Daughter would like an update.

## 2020-07-07 NOTE — ED Notes (Signed)
Patient s son JADER DESAI DROPPED HIS CELL PHONE AND CHARGER.MR [facility] PICKED UP PATIENT [employer]  BANK CARD AND INSUR.CARD. PHONE NUMBER TO CALL IS [phone]'

## 2020-07-07 NOTE — Progress Notes (Signed)
Marvin Foster for Warfarin  Indication: atrial fibrillation  Allergies  Allergen Reactions  . Neomycin-Bacitracin Zn-Polymyx Rash  . Penicillins Other (See Comments) and Rash    Pt states this was diagnosed years ago, unsure as to reaction. Will not use.   . Other Rash    Patient Measurements: Height: 5\' 9"  (175.3 cm) Weight: 104.3 kg (230 lb) IBW/kg (Calculated) : 70.7  Vital Signs: Temp: 98.3 F (36.8 C) (09/27 0034) Temp Source: Oral (09/27 0034) BP: 131/90 (09/27 0800) Pulse Rate: 109 (09/27 0800)  Labs: Recent Labs    07/06/20 2023 07/06/20 2232 07/07/20 0304  HGB 12.1*  --   --   HCT 37.3*  --   --   PLT 207  --   --   LABPROT  --  33.5* 36.1*  INR  --  3.4* 3.8*  CREATININE 0.94  --   --     Estimated Creatinine Clearance: 69.6 mL/min (by C-G formula based on SCr of 0.94 mg/dL).   Medical History: Past Medical History:  Diagnosis Date  . Arthritis 12/2005   left hand  . Arthritis    Left knee  . Blood transfusion without reported diagnosis   . Bursitis of knee    right knee  . CAD (coronary artery disease)   . Diabetes mellitus   . Diverticulosis   . Glaucoma    left eye  . Hyperlipidemia   . Hypertension   . Neuropathy    with pain  . Spinal stenosis 01/20/05  . Tubular adenoma of colon     Assessment: 86 y/o M with COVID-19 infection. On warfarin PTA for afib. To continue warfarin. INR is supra-therapeutic at 3.8. Hgb 12.1.   Goal of Therapy:  INR 2-3 Monitor platelets by anticoagulation protocol: Yes   Plan:  No warfarin today Daily PT/INR, resume warfarin as INR allows Monitor for bleeding  Corky Crafts, PharmD Candidate

## 2020-07-07 NOTE — Progress Notes (Signed)
ANTICOAGULATION CONSULT NOTE - Initial Consult  Pharmacy Consult for Warfarin  Indication: atrial fibrillation  Allergies  Allergen Reactions  . Neomycin-Bacitracin Zn-Polymyx Rash  . Penicillins Other (See Comments) and Rash    Pt states this was diagnosed years ago, unsure as to reaction. Will not use.   . Other Rash    Patient Measurements: Height: 5\' 9"  (175.3 cm) Weight: 104.3 kg (230 lb) IBW/kg (Calculated) : 70.7  Vital Signs: Temp: 101.2 F (38.4 C) (09/26 2147) Temp Source: Oral (09/26 2147) BP: 155/73 (09/26 2215) Pulse Rate: 101 (09/26 2215)  Labs: Recent Labs    07/06/20 2023 07/06/20 2232  HGB 12.1*  --   HCT 37.3*  --   PLT 207  --   LABPROT  --  33.5*  INR  --  3.4*  CREATININE 0.94  --     Estimated Creatinine Clearance: 69.6 mL/min (by C-G formula based on SCr of 0.94 mg/dL).   Medical History: Past Medical History:  Diagnosis Date  . Arthritis 12/2005   left hand  . Arthritis    Left knee  . Blood transfusion without reported diagnosis   . Bursitis of knee    right knee  . CAD (coronary artery disease)   . Diabetes mellitus   . Diverticulosis   . Glaucoma    left eye  . Hyperlipidemia   . Hypertension   . Neuropathy    with pain  . Spinal stenosis 01/20/05  . Tubular adenoma of colon     Assessment: 86 y/o M with COVID-19 infection. On warfarin PTA for afib. To continue warfarin. INR is supra-therapeutic at 3.4. Hgb 12.1.   Goal of Therapy:  INR 2-3 Monitor platelets by anticoagulation protocol: Yes   Plan:  No warfarin tonight Daily PT/INR, resume warfarin as INR allows Monitor for bleeding  Narda Bonds, PharmD, BCPS Clinical Pharmacist Phone: [phone]

## 2020-07-08 LAB — CBC WITH DIFFERENTIAL/PLATELET
Abs Immature Granulocytes: 0.08 10*3/uL — ABNORMAL HIGH (ref 0.00–0.07)
Basophils Absolute: 0 10*3/uL (ref 0.0–0.1)
Basophils Relative: 0 %
Eosinophils Absolute: 0 10*3/uL (ref 0.0–0.5)
Eosinophils Relative: 0 %
HCT: 34.4 % — ABNORMAL LOW (ref 39.0–52.0)
Hemoglobin: 11.1 g/dL — ABNORMAL LOW (ref 13.0–17.0)
Immature Granulocytes: 1 %
Lymphocytes Relative: 13 %
Lymphs Abs: 1 10*3/uL (ref 0.7–4.0)
MCH: 29.6 pg (ref 26.0–34.0)
MCHC: 32.3 g/dL (ref 30.0–36.0)
MCV: 91.7 fL (ref 80.0–100.0)
Monocytes Absolute: 0.4 10*3/uL (ref 0.1–1.0)
Monocytes Relative: 5 %
Neutro Abs: 6.5 10*3/uL (ref 1.7–7.7)
Neutrophils Relative %: 81 %
Platelets: 243 10*3/uL (ref 150–400)
RBC: 3.75 MIL/uL — ABNORMAL LOW (ref 4.22–5.81)
RDW: 13.6 % (ref 11.5–15.5)
WBC: 8.1 10*3/uL (ref 4.0–10.5)
nRBC: 0 % (ref 0.0–0.2)

## 2020-07-08 LAB — C-REACTIVE PROTEIN: CRP: 10.7 mg/dL — ABNORMAL HIGH (ref <1.0)

## 2020-07-08 LAB — PROTIME-INR
INR: 2.7 — ABNORMAL HIGH (ref 0.8–1.2)
Prothrombin Time: 27.5 seconds — ABNORMAL HIGH (ref 11.4–15.2)

## 2020-07-08 LAB — COMPREHENSIVE METABOLIC PANEL
ALT: 135 U/L — ABNORMAL HIGH (ref 0–44)
AST: 151 U/L — ABNORMAL HIGH (ref 15–41)
Albumin: 2.4 g/dL — ABNORMAL LOW (ref 3.5–5.0)
Alkaline Phosphatase: 58 U/L (ref 38–126)
Anion gap: 11 (ref 5–15)
BUN: 16 mg/dL (ref 8–23)
CO2: 21 mmol/L — ABNORMAL LOW (ref 22–32)
Calcium: 8.5 mg/dL — ABNORMAL LOW (ref 8.9–10.3)
Chloride: 103 mmol/L (ref 98–111)
Creatinine, Ser: 0.91 mg/dL (ref 0.61–1.24)
GFR calc Af Amer: >60 mL/min (ref >60)
GFR calc non Af Amer: >60 mL/min (ref >60)
Glucose, Bld: 169 mg/dL — ABNORMAL HIGH (ref 70–99)
Potassium: 4.5 mmol/L (ref 3.5–5.1)
Sodium: 135 mmol/L (ref 135–145)
Total Bilirubin: 1.3 mg/dL — ABNORMAL HIGH (ref 0.3–1.2)
Total Protein: 6.7 g/dL (ref 6.5–8.1)

## 2020-07-08 LAB — MAGNESIUM: Magnesium: 1.4 mg/dL — ABNORMAL LOW (ref 1.7–2.4)

## 2020-07-08 LAB — PHOSPHORUS: Phosphorus: 3.3 mg/dL (ref 2.5–4.6)

## 2020-07-08 LAB — D-DIMER, QUANTITATIVE: D-Dimer, Quant: 0.75 ug/mL-FEU — ABNORMAL HIGH (ref 0.00–0.50)

## 2020-07-08 LAB — GLUCOSE, CAPILLARY
Glucose-Capillary: 176 mg/dL — ABNORMAL HIGH (ref 70–99)
Glucose-Capillary: 189 mg/dL — ABNORMAL HIGH (ref 70–99)
Glucose-Capillary: 161 mg/dL — ABNORMAL HIGH (ref 70–99)

## 2020-07-08 LAB — FERRITIN: Ferritin: 651 ng/mL — ABNORMAL HIGH (ref 24–336)

## 2020-07-08 MED ORDER — MAGNESIUM SULFATE 4 GM/100ML IV SOLN
4.0000 g | Freq: Once | INTRAVENOUS | Status: AC
  Administered 2020-07-08: 4 g via INTRAVENOUS
  Filled 2020-07-08: qty 100

## 2020-07-08 MED ORDER — WARFARIN SODIUM 2.5 MG PO TABS
2.5000 mg | ORAL_TABLET | Freq: Once | ORAL | Status: AC
  Administered 2020-07-08: 2.5 mg via ORAL
  Filled 2020-07-08: qty 1

## 2020-07-08 MED ORDER — ERYTHROMYCIN 5 MG/GM OP OINT: 1.0000 "application " | TOPICAL_OINTMENT | Freq: Four times a day (QID) | OPHTHALMIC | 0 refills | Status: AC

## 2020-07-08 NOTE — Progress Notes (Signed)
Patient scheduled for outpatient Remdesivir infusions at 1pm on Wednesday 9/29, Thursday 9/30, and Friday 10/1 at [facility]. Please inform the patient to park at [place], as staff will be escorting the patient through the [place] entrance of the hospital. Appointments take approximately 45 minutes.    There is a wave flag banner located near the entrance on [address]. Turn into this entrance and immediately turn left or right and park in 1 of the 10 designated Covid Infusion Parking spots. There is a phone number on the sign, please call and let the staff know what spot you are in and we will come out and get you. For questions call [phone].  Thanks.

## 2020-07-08 NOTE — Evaluation (Addendum)
Physical Therapy Evaluation Patient Details Name: Marvin Foster MRN: [id number] DOB: 01-11-1934 Today's Date: 07/08/2020   History of Present Illness  86 y.o. male with medical history significant of atrial fibrillation, coronary artery disease, diabetes, hyperlipidemia, hypertension, spinal stenosis, diverticular disease, corneal transplant surgery diagnosed with covid in OP setting, admitted to ED on 9/27 with covid PNA.  Clinical Impression   Pt presents with generalized weakness, mild unsteadiness in standing (but no use of AD and accepts moderate challenge during standing balance tasks), WFL gait speed, and mildly decreased activity tolerance vs baseline.  Pt to benefit from acute PT to address deficits. Pt ambulated good hallway distance with no use of AD, no LOB even when challenged, and WFL sats at 92% and greater. Pt reports his mobility is close to baseline, just feels slightly weaker secondary to PNA. PT recommending support from family upon d/c, PT administered LE and UE HEP to return pt to PLOF. Pt performed all exercises well with initial cuing only. PT encouraged pt to mobilize multiple times a day upon d/c with supervision of son to increase circulation, improve LE strength, and improve activity tolerance. PT to progress mobility as tolerated, and will continue to follow acutely.      Follow Up Recommendations Supervision for mobility/OOB    Equipment Recommendations  None recommended by PT    Recommendations for Other Services       Precautions / Restrictions Precautions Precautions: Fall Restrictions Weight Bearing Restrictions: No      Mobility  Bed Mobility               General bed mobility comments: In recliner upon arrival  Transfers Overall transfer level: Needs assistance Equipment used: None Transfers: Sit to/from Stand Sit to Stand: Supervision         General transfer comment: for safety, increased time to rise and  steady  Ambulation/Gait Ambulation/Gait assistance: Supervision Gait Distance (Feet): 320 Feet Assistive device: None Gait Pattern/deviations: Step-through pattern;Decreased stride length;Shuffle;Trunk flexed Gait velocity: WFL   General Gait Details: supervision for safety, DOE 2/4 with SpO2 minimum 92% on RA. Pt with x3 productive coughs during mobility. Mildly unsteady, per pt this is baseline.  Stairs            Wheelchair Mobility    Modified Rankin (Stroke Patients Only)       Balance Overall balance assessment: Mild deficits observed, not formally tested                           High level balance activites: Direction changes;Head turns;Other (comment) High Level Balance Comments: Deviation to gait pattern and speed with horizontal and vertical head turns, increased time to pick up item off ground, WFL gait speed and directional changes             Pertinent Vitals/Pain Pain Assessment: Faces Faces Pain Scale: No hurt Pain Intervention(s): Monitored during session    Home Living Family/patient expects to be discharged to:: Private residence Living Arrangements: Alone Available Help at Discharge: Family;Available 24 hours/day (youngest son to stay with pt upon d/c) Type of Home: House Home Access: Level entry     Home Layout: One level Home Equipment: Grab bars - toilet;Grab bars - tub/shower;Cane - quad      Prior Function Level of Independence: Independent         Comments: Works two days a week at [employer], 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  Hand Dominance   Dominant Hand: Right    Extremity/Trunk Assessment   Upper Extremity Assessment Upper Extremity Assessment: Defer to OT evaluation    Lower Extremity Assessment Lower Extremity Assessment: Generalized weakness    Cervical / Trunk Assessment Cervical / Trunk Assessment: Other exceptions Cervical / Trunk Exceptions: forward head, rounded  shoulders  Communication   Communication: No difficulties;HOH (wears hearing aides)  Cognition Arousal/Alertness: Awake/alert Behavior During Therapy: WFL for tasks assessed/performed Overall Cognitive Status: Within Functional Limits for tasks assessed                                 General Comments: very pleasant, motivated to progress mobility      General Comments      Exercises General Exercises - Lower Extremity Long Arc Quad: AROM;Both;10 reps;Seated Hip ABduction/ADduction: AAROM;Both;10 reps;Seated (with LEs elevated in recliner) Straight Leg Raises: AAROM;Both;5 reps;Seated (with PT assist for lifting LEs, per pt his son can assist)   Assessment/Plan    PT Assessment Patient needs continued PT services  PT Problem List Decreased strength;Decreased mobility;Decreased balance;Decreased activity tolerance;Cardiopulmonary status limiting activity       PT Treatment Interventions DME instruction;Therapeutic activities;Gait training;Therapeutic exercise;Patient/family education;Balance training;Functional mobility training;Neuromuscular re-education    PT Goals (Current goals can be found in the Care Plan section)  Acute Rehab PT Goals Patient Stated Goal: d/c home with family support PT Goal Formulation: With patient Time For Goal Achievement: 07/22/20 Potential to Achieve Goals: Good    Frequency Min 3X/week   Barriers to discharge        Co-evaluation               AM-PAC PT "6 Clicks" Mobility  Outcome Measure Help needed turning from your back to your side while in a flat bed without using bedrails?: None Help needed moving from lying on your back to sitting on the side of a flat bed without using bedrails?: None Help needed moving to and from a bed to a chair (including a wheelchair)?: None Help needed standing up from a chair using your arms (e.g., wheelchair or bedside chair)?: None Help needed to walk in hospital room?: A  Little Help needed climbing 3-5 steps with a railing? : A Little 6 Click Score: 22    End of Session   Activity Tolerance: Patient tolerated treatment well Patient left: in chair;with call bell/phone within reach Nurse Communication: Mobility status PT Visit Diagnosis: Unsteadiness on feet (R26.81)    Time: 1445-1510 PT Time Calculation (min) (ACUTE ONLY): 25 min   Charges:   PT Evaluation $PT Eval Low Complexity: 1 Low         Jaileen Janelle E, PT Acute Rehabilitation Services Pager [phone]  Office [phone]   Tyrone Apple D Despina Hidden 07/08/2020, 4:15 PM

## 2020-07-08 NOTE — Discharge Instructions (Signed)
Patient scheduled for outpatient Remdesivir infusions at 1pm on Wednesday 9/29, Thursday 9/30, and Friday 10/1 at [facility]. Please inform the patient to park at [place], as staff will be escorting the patient through the [place] entrance of the hospital. Appointments take approximately 45 minutes.    There is a wave flag banner located near the entrance on [address]. Turn into this entrance and immediately turn left or right and park in 1 of the 10 designated Covid Infusion Parking spots. There is a phone number on the sign, please call and let the staff know what spot you are in and we will come out and get you. For questions call [phone].  Thanks.

## 2020-07-08 NOTE — Progress Notes (Signed)
SATURATION QUALIFICATIONS:  Patient Saturations on Room Air at Rest = 98%  Patient Saturations on Room Air while Ambulating = 97%  Patient showed no signs or symptoms of discomfort while walking in hallway on room air.  Newman Nip

## 2020-07-08 NOTE — Discharge Summary (Addendum)
Physician Discharge Summary  [name]:[id number] DOB: June 11, 1934 DOA: 07/06/2020  PCP: Sheliah Hatch, MD  Admit date: 07/06/2020 Discharge date: 07/08/2020  Time spent: 40 minutes  Recommendations for Outpatient Follow-up:  1. Follow outpatient CBC/CMP 2. Follow symptoms outpatient 3. Quarantine per CDC guidelines (Oct 6th) 4.  follow blood sugars off steroids 5. Follow with ophtho outpatient  6. INR supratherapeutic on admission, follow outpatient (therapeutic on d/c) 7. Repeat CXR in 3-4 weeks  Discharge Diagnoses:  Principal Problem:   Acute respiratory failure due to COVID-19 [facility]) Active Problems:   Diabetes mellitus type II, controlled (HCC)   Atrial fibrillation (HCC)   Cornea conical   Chronic anticoagulation   Hyperlipidemia   Coronary artery disease due to lipid rich plaque   Hypothyroid  Discharge Condition: stable  Diet recommendation: diabetes  Filed Weights   07/06/20 1918 07/08/20 0707  Weight: 104.3 kg 99.3 kg    History of present illness:  Marvin Foster 86 y.o.malewith medical history significant ofatrial fibrillation, coronary artery disease, diabetes, hyperlipidemia, hypertension, spinal stenosis, diverticular disease, corneal transplant surgery who was diagnosed with COVID-19 at urgent care about 11 days ago. Patient lost his son recently with COVID-19 and has been exposed. He started having shortness of breath and cough prior to going to urgent care. Mainly cough body aches and intermittent diarrhea. At that point no chest pain and no vomiting. Today he went back to urgent care where he was evaluated with chest x-ray showing pneumonia. Patient has new oxygen requirement and he is Keiondre Colee full sent over here where he is being admitted to the hospital for treatment. He does have irritation feeling like foreign body in the left eye for the last few days. With his prior history of corneal surgery evaluation done in the ER showing no  evidence of tear or ulcers. Visual changes were also not significantly seen. Patient has an eye ointment initiated and being admitted to the hospital for treatment of his Covid pneumonia..  ED Course:Temperature 101.2 blood pressure 162/92 pulse 113 respiratory 29 oxygen sats 95% on room air. Sodium 136 potassium 5.1 chloride 101 CO2 24 glucose is 138 BUN 12 creatinine 0.94 calcium 8.7. White count is 7.6 hemoglobin 12.1 platelets 207. PT 13.5 INR 3.4 with glucose again 138. Chest x-ray showed patchy bilateral airspace opacity in the mid and lower lung zone consistent with COVID-19 pneumonia. Patient initiated on treatment and being admitted to the hospital for further treatment  He was admitted for Glennice Marcos breakthrough covid infection.  He's improved on steroid and remdesivir.  Plan for discharge with plan for outpatient remdesivir.  Will d/c steroids in setting of his diabetes and risk of hyperglycemia as well as appears clinically improved.  Follow outpatient.  See below for additional details  Hospital Course:  Acute Hypoxic Resp Failure 2/2 COVID 19 Pneumonia Vaccinated CXR with patchy heterogenous bilateral airspace opacities in mid lower lung zone predominant distribution c/w covid 19 pneumonia Maintaining oxygen on RA at rest and with activity Plan for discharge with plan for outpatient remdesivir.  Stop steroids given risk of hyperglycemia with DM. Baricitinib d/c'd on admission given mild oxygen requirement Strict I/O, daily weights Prone as able, OOB, IS, flutter, therapy  COVID-19 Labs  Recent Labs    07/07/20 0006 07/08/20 0538  DDIMER 1.15* 0.75*  FERRITIN 467* 651*  LDH 201*  --   CRP 13.6* 10.7*    Lab Results  Component Value Date   SARSCOV2NAA POSITIVE (Hurley Blevins) 07/07/2020  SARSCOV2NAA Not Detected 05/18/2019   SARSCOV2NAA Not Detected 03/20/2019   T2DM Resume home regimen  A1c 6.8, follow outpatient off steroids  Atrial Fibrillation with RVR   Supratherapeutic INR Continue metop INR therapeutic today, follow outpatient   HLD Continue statin  Hypothyroidism Continue synthroid  Left Eye Abrasion? Continue topical eye ointment per ED, this sounds like Alisse Tuite chronic issue, he gets injections with ophtho outpatient and has discussed this with them before.  Negative flourescein per EDP.  Right eye 20/100, L eye 20/50 per EDP note. Follow outpatient   Elevated LFT's: 2/2 covid infection, follow outpatient Work up additionally if persistent  Procedures:  none  Consultations:  none  Discharge Exam: Vitals:   07/08/20 0707 07/08/20 0800  BP: (!) 161/67 (!) 157/87  Pulse: 83 90  Resp: 16 19  Temp: 98 F (36.7 C) 98.2 F (36.8 C)  SpO2: 94% 93%   Feeling better, back to normal  General: No acute distress. Cardiovascular: Heart sounds show Arayna Illescas regular rate, and rhythm.  Lungs: Clear to auscultation bilaterally  Abdomen: Soft, nontender, nondistended  Neurological: Alert and oriented 3. Moves all extremities 4 . Cranial nerves II through XII grossly intact. Skin: Warm and dry. No rashes or lesions. Extremities: No clubbing or cyanosis. No edema.   Discharge Instructions   Discharge Instructions    Call MD for:  difficulty breathing, headache or visual disturbances   Complete by: As directed    Call MD for:  extreme fatigue   Complete by: As directed    Call MD for:  hives   Complete by: As directed    Call MD for:  persistant dizziness or light-headedness   Complete by: As directed    Call MD for:  persistant nausea and vomiting   Complete by: As directed    Call MD for:  redness, tenderness, or signs of infection (pain, swelling, redness, odor or green/yellow discharge around incision site)   Complete by: As directed    Call MD for:  severe uncontrolled pain   Complete by: As directed    Call MD for:  temperature >100.4   Complete by: As directed    Diet - low sodium heart healthy   Complete by: As  directed    Discharge instructions   Complete by: As directed    You were seen for Madalene Foster covid 19 infection.  You've improved with steroids and remdesivir.  We'll send you home with Katerine Morua plan to finish remdesivir as an outpatient.  We will stop your steroids given your improvement and to avoid the risks of high blood sugars.  Continue to quarantine for 21 days from your positive test.  Your positive test was on 9/15 per discussion with your daughter, you can discontinue your quarantine after October 6th.  Watch your blood sugars as you return home off steroids.  Return for new, recurrent, or worsening symptoms.  Please ask your PCP to request records from this hospitalization so they know what was done and what the next steps will be.   Increase activity slowly   Complete by: As directed      Allergies as of 07/08/2020      Reactions   Neomycin-bacitracin Zn-polymyx Rash   Penicillins Other (See Comments), Rash   Pt states this was diagnosed years ago, unsure as to reaction. Will not use.    Other Rash      Medication List    TAKE these medications   atorvastatin 40 MG tablet Commonly known  as: LIPITOR TAKE 1 TABLET BY MOUTH AT BEDTIME   CALCIUM 600 + D PO Take 1 tablet by mouth 2 (two) times daily.   diclofenac Sodium 1 % Gel Commonly known as: Voltaren Apply 2 g topically 4 (four) times daily. What changed:   when to take this  reasons to take this   enalapril 5 MG tablet Commonly known as: VASOTEC Take 1 tablet (5 mg total) by mouth daily.   erythromycin ophthalmic ointment Place 1 application into the left eye every 6 (six) hours for 3 days.   Euthyrox 75 MCG tablet Generic drug: levothyroxine Take 1 tablet by mouth once daily   Fish Oil 1200 MG Caps Take 3 capsules by mouth daily.   fluorometholone 0.1 % ophthalmic suspension Commonly known as: FML Place 1 drop into both eyes every 30 (thirty) days.   latanoprost 0.005 % ophthalmic solution Commonly known  as: XALATAN Place 1 drop into both eyes at bedtime.   metFORMIN 1000 MG tablet Commonly known as: GLUCOPHAGE Take 1 tablet by mouth twice daily What changed: when to take this   metoprolol tartrate 100 MG tablet Commonly known as: LOPRESSOR Take 1 tablet by mouth twice daily   multivitamin capsule Take 1 capsule by mouth daily.   onetouch ultrasoft lancets Use as instructed   OneTouch Verio test strip Generic drug: glucose blood USE 1 STRIP TO CHECK GLUCOSE TWICE DAILY AS DIRECTED   timolol 0.5 % ophthalmic solution Commonly known as: TIMOPTIC Place 1 drop into both eyes daily.   warfarin 2.5 MG tablet Commonly known as: COUMADIN Take as directed. If you are unsure how to take this medication, talk to your nurse or doctor. Original instructions: TAKE AS DIRECTED BY  COUMADIN  CLINIC What changed: See the new instructions.            Durable Medical Equipment  (From admission, onward)         Start     Ordered   07/08/20 1415  DME Shower stool  Once        07/08/20 1422         Allergies  Allergen Reactions  . Neomycin-Bacitracin Zn-Polymyx Rash  . Penicillins Other (See Comments) and Rash    Pt states this was diagnosed years ago, unsure as to reaction. Will not use.   . Other Rash      The results of significant diagnostics from this hospitalization (including imaging, microbiology, ancillary and laboratory) are listed below for reference.    Significant Diagnostic Studies: DG Chest 2 View  Result Date: 07/06/2020 CLINICAL DATA:  Cough. COVID positive 9 days ago. Shortness of breath. EXAM: CHEST - 2 VIEW COMPARISON:  Rib radiographs 06/26/2018 FINDINGS: Mild cardiomegaly. Normal mediastinal contours. Aortic atherosclerosis. Coronary artery calcifications versus stent. Patchy heterogeneous bilateral airspace opacities in Donn Wilmot mid-lower lung zone predominant distribution. No pneumothorax or evidence of pneumomediastinum. No significant pleural effusion.  Degenerative change of the shoulders. IMPRESSION: Patchy heterogeneous bilateral airspace opacities in Ewin Rehberg mid-lower lung zone predominant distribution, consistent with COVID-19 pneumonia. Electronically Signed   By: Narda Rutherford M.D.   On: 07/06/2020 18:46    Microbiology: Recent Results (from the past 240 hour(s))  Respiratory Panel by RT PCR (Flu Audyn Dimercurio&B, Covid) - Nasopharyngeal Swab     Status: Abnormal   Collection Time: 07/07/20 12:06 AM   Specimen: Nasopharyngeal Swab  Result Value Ref Range Status   SARS Coronavirus 2 by RT PCR POSITIVE (Raschelle Wisenbaker) NEGATIVE Final    Comment: RESULT  CALLED TO, READ BACK BY AND VERIFIED WITH: H. LINDSEY,RN 0301 07/07/2020 T. TYSOR (NOTE) SARS-CoV-2 target nucleic acids are DETECTED.  SARS-CoV-2 RNA is generally detectable in upper respiratory specimens  during the acute phase of infection. Positive results are indicative of the presence of the identified virus, but do not rule out bacterial infection or co-infection with other pathogens not detected by the test. Clinical correlation with patient history and other diagnostic information is necessary to determine patient infection status. The expected result is Negative.  Fact Sheet for Patients:  https://www.moore.com/  Fact Sheet for Healthcare Providers: https://www.young.biz/  This test is not yet approved or cleared by the [place] FDA and  has been authorized for detection and/or diagnosis of SARS-CoV-2 by FDA under an Emergency Use Authorization (EUA).  This EUA will remain in effect (meaning this test can  be used) for the duration of  the COVID-19 declaration under Section 564(b)(1) of the Act, 21 U.S.C. section 360bbb-3(b)(1), unless the authorization is terminated or revoked sooner.      Influenza Shraga Custard by PCR NEGATIVE NEGATIVE Final   Influenza B by PCR NEGATIVE NEGATIVE Final    Comment: (NOTE) The Xpert Xpress SARS-CoV-2/FLU/RSV assay is  intended as an aid in  the diagnosis of influenza from Nasopharyngeal swab specimens and  should not be used as Jenavi Beedle sole basis for treatment. Nasal washings and  aspirates are unacceptable for Xpert Xpress SARS-CoV-2/FLU/RSV  testing.  Fact Sheet for Patients: https://www.moore.com/  Fact Sheet for Healthcare Providers: https://www.young.biz/  This test is not yet approved or cleared by the [place] FDA and  has been authorized for detection and/or diagnosis of SARS-CoV-2 by  FDA under an Emergency Use Authorization (EUA). This EUA will remain  in effect (meaning this test can be used) for the duration of the  Covid-19 declaration under Section 564(b)(1) of the Act, 21  U.S.C. section 360bbb-3(b)(1), unless the authorization is  terminated or revoked. Performed at [facility], [address], [place], [place] [address]   Blood Culture (routine x 2)     Status: None (Preliminary result)   Collection Time: 07/07/20 12:06 AM   Specimen: BLOOD RIGHT FOREARM  Result Value Ref Range Status   Specimen Description BLOOD RIGHT FOREARM  Final   Special Requests   Final    BOTTLES DRAWN AEROBIC AND ANAEROBIC Blood Culture adequate volume   Culture   Final    NO GROWTH 1 DAY Performed at [facility], [address], [place], [place] [address]    Report Status PENDING  Incomplete  Blood Culture (routine x 2)     Status: None (Preliminary result)   Collection Time: 07/07/20 12:06 AM   Specimen: BLOOD RIGHT HAND  Result Value Ref Range Status   Specimen Description BLOOD RIGHT HAND  Final   Special Requests   Final    BOTTLES DRAWN AEROBIC AND ANAEROBIC Blood Culture adequate volume   Culture   Final    NO GROWTH 1 DAY Performed at [facility], [address], [place], [place] [address]    Report Status PENDING  Incomplete     Labs: Basic Metabolic Panel: Recent Labs  Lab 07/06/20 2023 07/08/20 0538  NA 136 135  K  5.1 4.5  CL 101 103  CO2 24 21*  GLUCOSE 138* 169*  BUN 12 16  CREATININE 0.94 0.91  CALCIUM 8.7* 8.5*  MG  --  1.4*  PHOS  --  3.3  Liver Function Tests: Recent Labs  Lab 07/07/20 0006 07/08/20 0538  AST 78* 151*  ALT 89* 135*  ALKPHOS 55 58  BILITOT 1.5* 1.3*  PROT 6.4* 6.7  ALBUMIN 2.5* 2.4*   No results for input(s): LIPASE, AMYLASE in the last 168 hours. No results for input(s): AMMONIA in the last 168 hours. CBC: Recent Labs  Lab 07/06/20 2023 07/08/20 0538  WBC 7.6 8.1  NEUTROABS  --  6.5  HGB 12.1* 11.1*  HCT 37.3* 34.4*  MCV 94.2 91.7  PLT 207 243   Cardiac Enzymes: No results for input(s): CKTOTAL, CKMB, CKMBINDEX, TROPONINI in the last 168 hours. BNP: BNP (last 3 results) No results for input(s): BNP in the last 8760 hours.  ProBNP (last 3 results) No results for input(s): PROBNP in the last 8760 hours.  CBG: Recent Labs  Lab 07/07/20 1106 07/07/20 1638 07/07/20 2139 07/08/20 0739 07/08/20 1223  GLUCAP 264* 196* 149* 176* 189*       Signed:  Lacretia Nicks MD.  [facility] 07/08/2020, 2:25 PM

## 2020-07-08 NOTE — TOC Progression Note (Signed)
Transition of Care [facility]) - Progression Note    Patient Details  Name: ESPIRIDION SUPINSKI MRN: [id number] Date of Birth: 1933-12-09  Transition of Care [facility] CM/SW Contact  Angelita Ingles, RN Phone Number: [phone]  07/08/2020, 3:56 PM  Clinical Narrative:    CM received message from MD stating that OT has recommended DME shower chair. CM called DME company and was told that shower chair is not covered by insurance. Spoke with daughter Clarene Critchley who states that they have 3 shower chairs at home. No other needs noted. CM will sign off.         Expected Discharge Plan and Services           Expected Discharge Date: 07/08/20                                     Social Determinants of Health (SDOH) Interventions    Readmission Risk Interventions No flowsheet data found.

## 2020-07-08 NOTE — Progress Notes (Signed)
ANTICOAGULATION CONSULT NOTE - Consult  Pharmacy Consult for Warfarin  Indication: atrial fibrillation  Allergies  Allergen Reactions  . Neomycin-Bacitracin Zn-Polymyx Rash  . Penicillins Other (See Comments) and Rash    Pt states this was diagnosed years ago, unsure as to reaction. Will not use.   . Other Rash    Patient Measurements: Height: 5\' 9"  (175.3 cm) Weight: 99.3 kg (218 lb 14.7 oz) IBW/kg (Calculated) : 70.7  Vital Signs: Temp: 98.2 F (36.8 C) (09/28 0800) Temp Source: Oral (09/28 0707) BP: 157/87 (09/28 0800) Pulse Rate: 90 (09/28 0800)  Labs: Recent Labs    07/06/20 2023 07/06/20 2232 07/07/20 0304 07/08/20 0538  HGB 12.1*  --   --  11.1*  HCT 37.3*  --   --  34.4*  PLT 207  --   --  243  LABPROT  --  33.5* 36.1* 27.5*  INR  --  3.4* 3.8* 2.7*  CREATININE 0.94  --   --  0.91    Estimated Creatinine Clearance: 70.2 mL/min (by C-G formula based on SCr of 0.91 mg/dL).   Medical History: Past Medical History:  Diagnosis Date  . Arthritis 12/2005   left hand  . Arthritis    Left knee  . Blood transfusion without reported diagnosis   . Bursitis of knee    right knee  . CAD (coronary artery disease)   . Diabetes mellitus   . Diverticulosis   . Glaucoma    left eye  . Hyperlipidemia   . Hypertension   . Neuropathy    with pain  . Spinal stenosis 01/20/05  . Tubular adenoma of colon     Assessment: 86 y/o M with COVID-19 infection. On warfarin PTA for afib. To continue warfarin.   Current warfarin dosing PTA: 2.5 mg every day, except Wed take 3.75  INR is therapeutic at 2.7. Hgb 11.1.   Goal of Therapy:  INR 2-3 Monitor platelets by anticoagulation protocol: Yes   Plan:  Warfarin 2.5 mg po x 1 Daily PT/INR Monitor for bleeding  Alanda Slim, PharmD, [facility] Clinical Pharmacist Please see AMION for all Pharmacists' Contact Phone Numbers 07/08/2020, 9:53 AM

## 2020-07-08 NOTE — Progress Notes (Signed)
Occupational Therapy Evaluation Patient Details Name: Marvin Foster MRN: [id number] DOB: 1934-06-19 Today's Date: 07/08/2020    History of Present Illness 86 y.o. male with medical history significant of atrial fibrillation, coronary artery disease, diabetes, hyperlipidemia, hypertension, spinal stenosis, diverticular disease, corneal transplant surgery who was diagnosed with COVID-19 at urgent care about 11 days ago.  Vaccinated for COVID-19 per pt. Patient lost his son recently with COVID-19 and has been exposed.   Clinical Impression   PTA, pt was living alone and was independent. Pt currently performing ADLs at Supervision level. Pt presenting near baseline function with increased time for fatigue. Pt performing functional mobility in hallway with Supervision and maintaining SpO2 >91% on RA. Providing pt with education on theraband exercises for UEs. Also discussing use of shower seat for EC during bathing. Pt would benefit from further acute OT to facilitate safe dc. Recommend dc to home with HHOT for further OT to optimize safety, independence with ADLs, and return to PLOF.     Follow Up Recommendations  Home health OT;Supervision - Intermittent    Equipment Recommendations  Tub/shower seat    Recommendations for Other Services PT consult     Precautions / Restrictions Precautions Precautions: Fall      Mobility Bed Mobility               General bed mobility comments: In recliner upon arrival  Transfers Overall transfer level: Needs assistance Equipment used: None Transfers: Sit to/from Stand Sit to Stand: Supervision         General transfer comment: Supervision for safety    Balance Overall balance assessment: Mild deficits observed, not formally tested                                         ADL either performed or assessed with clinical judgement   ADL Overall ADL's : Needs assistance/impaired Eating/Feeding: Set up;Sitting    Grooming: Supervision/safety;Standing   Upper Body Bathing: Sitting;Set up   Lower Body Bathing: Supervison/ safety;Sit to/from stand   Upper Body Dressing : Set up;Sitting   Lower Body Dressing: Supervision/safety;Sit to/from stand   Toilet Transfer: Supervision/safety;Ambulation (simulated to reclienr)           Functional mobility during ADLs: Supervision/safety General ADL Comments: Pt performing ADLs at Supervision     Vision Baseline Vision/History: Wears glasses Wears Glasses: At all times Patient Visual Report: No change from baseline       Perception     Praxis      Pertinent Vitals/Pain Pain Assessment: No/denies pain     Hand Dominance Right   Extremity/Trunk Assessment Upper Extremity Assessment Upper Extremity Assessment: Overall WFL for tasks assessed   Lower Extremity Assessment Lower Extremity Assessment: Defer to PT evaluation   Cervical / Trunk Assessment Cervical / Trunk Assessment: Normal   Communication Communication Communication: No difficulties;HOH (wears hearing aides)   Cognition Arousal/Alertness: Awake/alert Behavior During Therapy: WFL for tasks assessed/performed Overall Cognitive Status: Within Functional Limits for tasks assessed                                     General Comments  SpO2 >91% on RA throughout    Exercises Exercises: General Upper Extremity General Exercises - Upper Extremity Shoulder Flexion: Strengthening;10 reps;Both;Seated Shoulder Horizontal ABduction: Strengthening;10 reps;Seated;Both Shoulder Horizontal ADduction:  Strengthening;Both;10 reps;Seated Elbow Flexion: Strengthening;10 reps;Seated;Both Elbow Extension: Strengthening;Both;10 reps;Seated   Shoulder Instructions      Home Living Family/patient expects to be discharged to:: Private residence Living Arrangements: Alone Available Help at Discharge: Family Type of Home: House Home Access: Level entry     Home Layout:  One level     Bathroom Shower/Tub: Chief Strategy Officer: Handicapped height     Home Equipment: Grab bars - toilet;Grab bars - tub/shower;Cane - quad          Prior Functioning/Environment Level of Independence: Independent        Comments: Works two days a week at [employer] List: Decreased strength;Decreased range of motion;Decreased activity tolerance;Impaired balance (sitting and/or standing);Decreased knowledge of use of DME or AE;Decreased knowledge of precautions      OT Treatment/Interventions: Self-care/ADL training;Therapeutic exercise;Energy conservation;DME and/or AE instruction;Therapeutic activities;Patient/family education    OT Goals(Current goals can be found in the care plan section) Acute Rehab OT Goals Patient Stated Goal: Go home OT Goal Formulation: With patient Time For Goal Achievement: 07/22/20 Potential to Achieve Goals: Good  OT Frequency: Min 2X/week   Barriers to D/C:            Co-evaluation              AM-PAC OT "6 Clicks" Daily Activity     Outcome Measure Help from another person eating meals?: None Help from another person taking care of personal grooming?: A Little Help from another person toileting, which includes using toliet, bedpan, or urinal?: A Little Help from another person bathing (including washing, rinsing, drying)?: A Little Help from another person to put on and taking off regular upper body clothing?: None Help from another person to put on and taking off regular lower body clothing?: A Little 6 Click Score: 20   End of Session Nurse Communication: Mobility status  Activity Tolerance: Patient tolerated treatment well Patient left: in chair;with call bell/phone within reach  OT Visit Diagnosis: Other abnormalities of gait and mobility (R26.89);Unsteadiness on feet (R26.81);Muscle weakness (generalized) (M62.81)                Time: 1232-1300 OT Time Calculation (min):  28 min Charges:  OT General Charges $OT Visit: 1 Visit OT Evaluation $OT Eval Low Complexity: 1 Low OT Treatments $Therapeutic Exercise: 8-22 mins  Christhoper Busbee MSOT, OTR/L Acute Rehab Pager: [phone] Office: [phone]  Theodoro Grist Anacristina Steffek 07/08/2020, 1:03 PM

## 2020-07-09 ENCOUNTER — Ambulatory Visit (HOSPITAL_COMMUNITY)
Admit: 2020-07-09 | Discharge: 2020-07-09 | Disposition: A | Discharge status: Home / Self Care | Payer: Medicare HMO | Attending: Pulmonary Disease | Admitting: Pulmonary Disease

## 2020-07-09 ENCOUNTER — Telehealth: Payer: Self-pay

## 2020-07-09 DIAGNOSIS — U071 COVID-19: Secondary | ICD-10-CM | POA: Insufficient documentation

## 2020-07-09 MED ORDER — ALBUTEROL SULFATE HFA 108 (90 BASE) MCG/ACT IN AERS: 2.0000 | INHALATION_SPRAY | Freq: Once | RESPIRATORY_TRACT | Status: DC | PRN

## 2020-07-09 MED ORDER — METHYLPREDNISOLONE SODIUM SUCC 125 MG IJ SOLR: 125.0000 mg | Freq: Once | INTRAMUSCULAR | Status: DC | PRN

## 2020-07-09 MED ORDER — SODIUM CHLORIDE 0.9 % IV SOLN: INTRAVENOUS | Status: DC | PRN

## 2020-07-09 MED ORDER — DIPHENHYDRAMINE HCL 50 MG/ML IJ SOLN: 50.0000 mg | Freq: Once | INTRAMUSCULAR | Status: DC | PRN

## 2020-07-09 MED ORDER — EPINEPHRINE 0.3 MG/0.3ML IJ SOAJ: 0.3000 mg | Freq: Once | INTRAMUSCULAR | Status: DC | PRN

## 2020-07-09 MED ORDER — SODIUM CHLORIDE 0.9 % IV SOLN
100.0000 mg | Freq: Once | INTRAVENOUS | Status: AC
  Administered 2020-07-09: 100 mg via INTRAVENOUS
  Filled 2020-07-09: qty 20

## 2020-07-09 MED ORDER — FAMOTIDINE IN NACL 20-0.9 MG/50ML-% IV SOLN: 20.0000 mg | Freq: Once | INTRAVENOUS | Status: DC | PRN

## 2020-07-09 NOTE — Telephone Encounter (Signed)
Transition Care Management Follow-up Telephone Call  Date of discharge and from where: 07/08/20-[facility]  How have you been since you were released from the hospital? Good-Still coughing  Any questions or concerns? No  Items Reviewed:  Did the pt receive and understand the discharge instructions provided? Yes   Medications obtained and verified? Yes   Any new allergies since your discharge? No   Dietary orders reviewed? Yes  Do you have support at home? Yes   Functional Questionnaire: (I = Independent and D = Dependent) ADLs: I  Bathing/Dressing- I  Meal Prep- I  Eating- I  Maintaining continence- I  Transferring/Ambulation- I  Managing Meds- I  Follow up appointments reviewed:   PCP Hospital f/u appt confirmed? Yes  Scheduled to see Dr. Birdie Riddle on 07/18/20 @ [facility] f/u appt confirmed? No -N/A  Are transportation arrangements needed? No   If their condition worsens, is the pt aware to call PCP or go to the Emergency Dept.? Yes  Was the patient provided with contact information for the PCP's office or ED? Yes  Was to pt encouraged to call back with questions or concerns? Yes

## 2020-07-09 NOTE — Discharge Instructions (Signed)
10 Things You Can Do to Manage Your COVID-19 Symptoms at Home If you have possible or confirmed COVID-19: 1. Stay home from work and school. And stay away from other public places. If you must go out, avoid using any kind of public transportation, ridesharing, or taxis. 2. Monitor your symptoms carefully. If your symptoms get worse, call your healthcare provider immediately. 3. Get rest and stay hydrated. 4. If you have a medical appointment, call the healthcare provider ahead of time and tell them that you have or may have COVID-19. 5. For medical emergencies, call 911 and notify the dispatch personnel that you have or may have COVID-19. 6. Cover your cough and sneezes with a tissue or use the inside of your elbow. 7. Wash your hands often with soap and water for at least 20 seconds or clean your hands with an alcohol-based hand sanitizer that contains at least 60% alcohol. 8. As much as possible, stay in a specific room and away from other people in your home. Also, you should use a separate bathroom, if available. If you need to be around other people in or outside of the home, wear a mask. 9. Avoid sharing personal items with other people in your household, like dishes, towels, and bedding. 10. Clean all surfaces that are touched often, like counters, tabletops, and doorknobs. Use household cleaning sprays or wipes according to the label instructions. cdc.gov/coronavirus 04/11/2019 This information is not intended to replace advice given to you by your health care provider. Make sure you discuss any questions you have with your health care provider. Document Revised: 09/13/2019 Document Reviewed: 09/13/2019 Elsevier Patient Education  2020 Elsevier Inc.  

## 2020-07-09 NOTE — Progress Notes (Signed)
  Diagnosis: COVID-19  Physician: Asencion Noble, MD  Procedure: [facility] Med: remdesivir infusion - Provided patient with remdesivir fact sheet for patients, parents and caregivers prior to infusion.  Complications: No immediate complications noted.  Discharge: Discharged home   Janne Napoleon 07/09/2020

## 2020-07-09 NOTE — Telephone Encounter (Signed)
Completed patient's TCM call today & he says he is still coughing & has some chest congestion. He wants to know if something can be prescribed his cough.    Also,his discharge summary mentioned repeating a CBC & a CMP. He has an appt for his hospital f/u on 07/18/20 & he wants know if he should have the labs done prior to his visit so the results will be available when he comes in.

## 2020-07-10 ENCOUNTER — Ambulatory Visit (HOSPITAL_COMMUNITY)
Admit: 2020-07-10 | Discharge: 2020-07-10 | Disposition: A | Discharge status: Home / Self Care | Payer: Medicare HMO | Attending: Pulmonary Disease | Admitting: Pulmonary Disease

## 2020-07-10 DIAGNOSIS — U071 COVID-19: Secondary | ICD-10-CM | POA: Diagnosis present

## 2020-07-10 DIAGNOSIS — J1282 Pneumonia due to coronavirus disease 2019: Secondary | ICD-10-CM | POA: Insufficient documentation

## 2020-07-10 MED ORDER — ALBUTEROL SULFATE HFA 108 (90 BASE) MCG/ACT IN AERS: 2.0000 | INHALATION_SPRAY | Freq: Once | RESPIRATORY_TRACT | Status: DC | PRN

## 2020-07-10 MED ORDER — EPINEPHRINE 0.3 MG/0.3ML IJ SOAJ: 0.3000 mg | Freq: Once | INTRAMUSCULAR | Status: DC | PRN

## 2020-07-10 MED ORDER — FAMOTIDINE IN NACL 20-0.9 MG/50ML-% IV SOLN: 20.0000 mg | Freq: Once | INTRAVENOUS | Status: DC | PRN

## 2020-07-10 MED ORDER — SODIUM CHLORIDE 0.9 % IV SOLN
100.0000 mg | Freq: Once | INTRAVENOUS | Status: AC
  Administered 2020-07-10: 100 mg via INTRAVENOUS

## 2020-07-10 MED ORDER — SODIUM CHLORIDE 0.9 % IV SOLN: INTRAVENOUS | Status: DC | PRN

## 2020-07-10 MED ORDER — DIPHENHYDRAMINE HCL 50 MG/ML IJ SOLN: 50.0000 mg | Freq: Once | INTRAMUSCULAR | Status: DC | PRN

## 2020-07-10 MED ORDER — METHYLPREDNISOLONE SODIUM SUCC 125 MG IJ SOLR: 125.0000 mg | Freq: Once | INTRAMUSCULAR | Status: DC | PRN

## 2020-07-10 MED ORDER — GUAIFENESIN-CODEINE 100-10 MG/5ML PO SYRP: 10.0000 mL | ORAL_SOLUTION | Freq: Three times a day (TID) | ORAL | 0 refills | Status: DC | PRN

## 2020-07-10 MED ORDER — SODIUM CHLORIDE 0.9 % IV SOLN
100.0000 mg | Freq: Once | INTRAVENOUS | Status: DC
  Filled 2020-07-10: qty 20

## 2020-07-10 NOTE — Telephone Encounter (Signed)
Called and informed pt of PCP recommendations. He was adamant about getting his labs before his appointment. Pt finally agreed to rest and call me on the 5th to let me know how he is feeling and we would re-evaluate the possibility of having labs drawn before his appointment.

## 2020-07-10 NOTE — Discharge Instructions (Signed)
10 Things You Can Do to Manage Your COVID-19 Symptoms at Home If you have possible or confirmed COVID-19: 1. Stay home from work and school. And stay away from other public places. If you must go out, avoid using any kind of public transportation, ridesharing, or taxis. 2. Monitor your symptoms carefully. If your symptoms get worse, call your healthcare provider immediately. 3. Get rest and stay hydrated. 4. If you have a medical appointment, call the healthcare provider ahead of time and tell them that you have or may have COVID-19. 5. For medical emergencies, call 911 and notify the dispatch personnel that you have or may have COVID-19. 6. Cover your cough and sneezes with a tissue or use the inside of your elbow. 7. Wash your hands often with soap and water for at least 20 seconds or clean your hands with an alcohol-based hand sanitizer that contains at least 60% alcohol. 8. As much as possible, stay in a specific room and away from other people in your home. Also, you should use a separate bathroom, if available. If you need to be around other people in or outside of the home, wear a mask. 9. Avoid sharing personal items with other people in your household, like dishes, towels, and bedding. 10. Clean all surfaces that are touched often, like counters, tabletops, and doorknobs. Use household cleaning sprays or wipes according to the label instructions. cdc.gov/coronavirus 04/11/2019 This information is not intended to replace advice given to you by your health care provider. Make sure you discuss any questions you have with your health care provider. Document Revised: 09/13/2019 Document Reviewed: 09/13/2019 Elsevier Patient Education  2020 Elsevier Inc.  

## 2020-07-10 NOTE — Progress Notes (Signed)
  Diagnosis: COVID-19  Physician: Dr. Joya Gaskins  Procedure: [facility] Med: remdesivir infusion - Provided patient with remdesivir fact sheet for patients, parents and caregivers prior to infusion.  Complications: No immediate complications noted.  Discharge: Discharged home   Marvin Foster 07/10/2020

## 2020-07-10 NOTE — Telephone Encounter (Signed)
I sent codeine based cough syrup for pt.  I want him to wait until his appt for his blood work and remain quarantined and recovering until them.

## 2020-07-10 NOTE — Addendum Note (Signed)
Addended by: [name] on: 07/10/2020 07:32 AM   Modules accepted: Orders

## 2020-07-11 ENCOUNTER — Ambulatory Visit (HOSPITAL_COMMUNITY)
Admit: 2020-07-11 | Discharge: 2020-07-11 | Disposition: A | Discharge status: Home / Self Care | Payer: Medicare HMO | Source: Ambulatory Visit | Attending: Pulmonary Disease | Admitting: Pulmonary Disease

## 2020-07-11 DIAGNOSIS — U071 COVID-19: Secondary | ICD-10-CM | POA: Insufficient documentation

## 2020-07-11 DIAGNOSIS — J1282 Pneumonia due to coronavirus disease 2019: Secondary | ICD-10-CM | POA: Diagnosis not present

## 2020-07-11 MED ORDER — ALBUTEROL SULFATE HFA 108 (90 BASE) MCG/ACT IN AERS: 2.0000 | INHALATION_SPRAY | Freq: Once | RESPIRATORY_TRACT | Status: DC | PRN

## 2020-07-11 MED ORDER — FAMOTIDINE IN NACL 20-0.9 MG/50ML-% IV SOLN: 20.0000 mg | Freq: Once | INTRAVENOUS | Status: DC | PRN

## 2020-07-11 MED ORDER — DIPHENHYDRAMINE HCL 50 MG/ML IJ SOLN: 50.0000 mg | Freq: Once | INTRAMUSCULAR | Status: DC | PRN

## 2020-07-11 MED ORDER — SODIUM CHLORIDE 0.9 % IV SOLN: INTRAVENOUS | Status: DC | PRN

## 2020-07-11 MED ORDER — METHYLPREDNISOLONE SODIUM SUCC 125 MG IJ SOLR: 125.0000 mg | Freq: Once | INTRAMUSCULAR | Status: DC | PRN

## 2020-07-11 MED ORDER — SODIUM CHLORIDE 0.9 % IV SOLN
100.0000 mg | Freq: Once | INTRAVENOUS | Status: AC
  Administered 2020-07-11: 100 mg via INTRAVENOUS

## 2020-07-11 MED ORDER — EPINEPHRINE 0.3 MG/0.3ML IJ SOAJ: 0.3000 mg | Freq: Once | INTRAMUSCULAR | Status: DC | PRN

## 2020-07-11 NOTE — Progress Notes (Signed)
  Diagnosis: COVID-19  Physician: Dr. Joya Gaskins  Procedure: [facility] Med: remdesivir infusion - Provided patient with remdesivir fact sheet for patients, parents and caregivers prior to infusion.  Complications: No immediate complications noted.  Discharge: Discharged home   Marvin Foster 07/11/2020

## 2020-07-12 LAB — CULTURE, BLOOD (ROUTINE X 2)
Culture: NO GROWTH
Culture: NO GROWTH
Special Requests: ADEQUATE
Special Requests: ADEQUATE

## 2020-07-18 ENCOUNTER — Other Ambulatory Visit: Payer: Self-pay

## 2020-07-18 ENCOUNTER — Encounter: Payer: Self-pay | Admitting: Family Medicine

## 2020-07-18 ENCOUNTER — Ambulatory Visit (INDEPENDENT_AMBULATORY_CARE_PROVIDER_SITE_OTHER): Payer: Medicare HMO | Admitting: Family Medicine

## 2020-07-18 VITALS — BP 121/86 | HR 83 | Temp 98.0°F | Resp 17 | Ht 69.0 in | Wt 220.2 lb

## 2020-07-18 DIAGNOSIS — R748 Abnormal levels of other serum enzymes: Secondary | ICD-10-CM | POA: Diagnosis not present

## 2020-07-18 DIAGNOSIS — J9600 Acute respiratory failure, unspecified whether with hypoxia or hypercapnia: Secondary | ICD-10-CM

## 2020-07-18 DIAGNOSIS — D649 Anemia, unspecified: Secondary | ICD-10-CM | POA: Diagnosis not present

## 2020-07-18 DIAGNOSIS — R300 Dysuria: Secondary | ICD-10-CM

## 2020-07-18 DIAGNOSIS — Z23 Encounter for immunization: Secondary | ICD-10-CM | POA: Diagnosis not present

## 2020-07-18 DIAGNOSIS — U071 COVID-19: Secondary | ICD-10-CM | POA: Diagnosis not present

## 2020-07-18 LAB — POCT URINALYSIS DIPSTICK (MANUAL)
Leukocytes, UA: NEGATIVE
Nitrite, UA: NEGATIVE
Poct Bilirubin: NEGATIVE
Poct Blood: NEGATIVE
Poct Glucose: NORMAL mg/dL
Poct Ketones: NEGATIVE
Poct Protein: NEGATIVE mg/dL
Poct Urobilinogen: NORMAL mg/dL
Spec Grav, UA: 1.02 (ref 1.010–1.025)
pH, UA: 6 (ref 5.0–8.0)

## 2020-07-18 LAB — CBC WITH DIFFERENTIAL/PLATELET
Basophils Absolute: 0.1 10*3/uL (ref 0.0–0.1)
Basophils Relative: 1.2 % (ref 0.0–3.0)
Eosinophils Absolute: 0.1 10*3/uL (ref 0.0–0.7)
Eosinophils Relative: 1.9 % (ref 0.0–5.0)
HCT: 37.1 % — ABNORMAL LOW (ref 39.0–52.0)
Hemoglobin: 12.2 g/dL — ABNORMAL LOW (ref 13.0–17.0)
Lymphocytes Relative: 21.3 % (ref 12.0–46.0)
Lymphs Abs: 1.5 10*3/uL (ref 0.7–4.0)
MCHC: 33 g/dL (ref 30.0–36.0)
MCV: 92.2 fl (ref 78.0–100.0)
Monocytes Absolute: 0.8 10*3/uL (ref 0.1–1.0)
Monocytes Relative: 10.8 % (ref 3.0–12.0)
Neutro Abs: 4.7 10*3/uL (ref 1.4–7.7)
Neutrophils Relative %: 64.8 % (ref 43.0–77.0)
Platelets: 241 10*3/uL (ref 150.0–400.0)
RBC: 4.03 Mil/uL — ABNORMAL LOW (ref 4.22–5.81)
RDW: 15.1 % (ref 11.5–15.5)
WBC: 7.3 10*3/uL (ref 4.0–10.5)

## 2020-07-18 LAB — BASIC METABOLIC PANEL
BUN: 20 mg/dL (ref 6–23)
CO2: 28 mEq/L (ref 19–32)
Calcium: 8.7 mg/dL (ref 8.4–10.5)
Chloride: 102 mEq/L (ref 96–112)
Creatinine, Ser: 1.08 mg/dL (ref 0.40–1.50)
GFR: 62.42 mL/min (ref >60.00)
Glucose, Bld: 117 mg/dL — ABNORMAL HIGH (ref 70–99)
Potassium: 4.8 mEq/L (ref 3.5–5.1)
Sodium: 139 mEq/L (ref 135–145)

## 2020-07-18 LAB — HEPATIC FUNCTION PANEL
ALT: 41 U/L (ref 0–53)
AST: 38 U/L — ABNORMAL HIGH (ref 0–37)
Albumin: 3.5 g/dL (ref 3.5–5.2)
Alkaline Phosphatase: 59 U/L (ref 39–117)
Bilirubin, Direct: 0.3 mg/dL (ref 0.0–0.3)
Total Bilirubin: 0.9 mg/dL (ref 0.2–1.2)
Total Protein: 6.5 g/dL (ref 6.0–8.3)

## 2020-07-18 LAB — MAGNESIUM: Magnesium: 1.3 mg/dL — ABNORMAL LOW (ref 1.5–2.5)

## 2020-07-18 NOTE — Patient Instructions (Addendum)
Follow up as needed or as scheduled We'll notify you of your lab results and make any changes if needed You can get the COVID booster shot at the beginning of the year Call with any questions or concerns Allow yourself time to recover!  Don't rush it! Have a great weekend!!

## 2020-07-18 NOTE — Progress Notes (Signed)
   Subjective:    Patient ID: Marvin Foster, male    DOB: [birth year], 86 y.o.   MRN: [id number]  [facility] f/u- pt was admitted 9/26-9/28 due to COVID Pneumonia and hypoxic respiratory failure.  He was vaccinated and thankfully improved on steroids and Remdesivir.  Liver enzymes were elevated at time of hospital d/c.  Magnesium was low.  No SOB, CP, dizziness, brain fog.  No leg swelling or pain.  No N/V/D.  Eating and drinking well.  Pt is having intermittent dysphagia- occurs w/ both food and water but not necessarily at the same time.  Dysuria started during hospitalization and persisted since d/c.  But he states this is improving today  Reviewed d/c summary, labs, image reports.  Meds reconciled   Review of Systems For ROS see HPI   This visit occurred during the SARS-CoV-2 public health emergency.  Safety protocols were in place, including screening questions prior to the visit, additional usage of staff PPE, and extensive cleaning of exam room while observing appropriate contact time as indicated for disinfecting solutions.       Objective:   Physical Exam Vitals reviewed.  Constitutional:      General: He is not in acute distress.    Appearance: Normal appearance. He is well-developed. He is not ill-appearing or toxic-appearing.  HENT:     Head: Normocephalic and atraumatic.  Eyes:     Conjunctiva/sclera: Conjunctivae normal.     Pupils: Pupils are equal, round, and reactive to light.  Neck:     Thyroid: No thyromegaly.  Cardiovascular:     Rate and Rhythm: Normal rate. Rhythm irregular.     Heart sounds: Normal heart sounds. No murmur heard.   Pulmonary:     Effort: Pulmonary effort is normal. No respiratory distress.     Breath sounds: Normal breath sounds.  Abdominal:     General: Bowel sounds are normal. There is no distension.     Palpations: Abdomen is soft.  Musculoskeletal:     Cervical back: Normal range of motion and neck supple.  Lymphadenopathy:      Cervical: No cervical adenopathy.  Skin:    General: Skin is warm and dry.  Neurological:     Mental Status: He is alert and oriented to person, place, and time.     Cranial Nerves: No cranial nerve deficit.  Psychiatric:        Behavior: Behavior normal.           Assessment & Plan:  Dysuria- pt reports this is improving since d/c but starting during his hospitalization.  Check UA.  No evidence of infxn.  Hypomagnesemia-was 1.4 at time of d/c.  Repeat magnesium and replete prn.  Elevated LFTs- at d/c AST 151 and ALT 135.  Pt denies abd pain, N/V/D.  Check labs to ensure levels are normalizing.

## 2020-07-20 NOTE — Assessment & Plan Note (Signed)
Hgb was 11.1 at time of d/c which was down from his admission labs (12.1)  Repeat CBC to ensure stability.

## 2020-07-20 NOTE — Assessment & Plan Note (Signed)
Resolved.  Pt is doing well off O2 and reports feeling really good.  Denies cough, SOB, excessive fatigue.  Encouraged him to let me know if anything changes- particularly w/ the risk of DVT and PE w/ COVID infxn

## 2020-07-28 ENCOUNTER — Encounter: Payer: Self-pay | Admitting: Family Medicine

## 2020-07-28 MED ORDER — TAMSULOSIN HCL 0.4 MG PO CAPS: 0.4000 mg | ORAL_CAPSULE | Freq: Every day | ORAL | 3 refills | Status: DC

## 2020-07-31 ENCOUNTER — Encounter: Payer: Self-pay | Admitting: Family Medicine

## 2020-08-06 ENCOUNTER — Telehealth: Payer: Self-pay | Admitting: Family Medicine

## 2020-08-06 NOTE — Telephone Encounter (Signed)
Pt has been scheduled.  °

## 2020-08-06 NOTE — Telephone Encounter (Signed)
Left message for patient to schedule Annual Wellness Visit.  Please schedule with Nurse Health Advisor Martha Stanley, RN at [place] [facility]  

## 2020-08-07 ENCOUNTER — Other Ambulatory Visit: Payer: Self-pay | Admitting: Cardiology

## 2020-08-07 ENCOUNTER — Other Ambulatory Visit: Payer: Self-pay | Admitting: Family Medicine

## 2020-08-08 NOTE — Progress Notes (Signed)
Subjective:   Marvin Foster is a 86 y.o. male who presents for Medicare Annual/Subsequent preventive examination.   I connected with Rickey today by telephone and verified that I am speaking with the correct person using two identifiers. Location patient: home Location provider: work Persons participating in the virtual visit: patient, Marine scientist.    I discussed the limitations, risks, security and privacy concerns of performing an evaluation and management service by telephone and the availability of in person appointments. I also discussed with the patient that there may be a patient responsible charge related to this service. The patient expressed understanding and verbally consented to this telephonic visit.    Interactive audio and video telecommunications were attempted between this provider and patient, however failed, due to patient having technical difficulties OR patient did not have access to video capability.  We continued and completed visit with audio only.  Some vital signs may be absent or patient reported.   Time Spent with patient on telephone encounter: 20 minutes  Review of Systems     Cardiac Risk Factors include: advanced age (>75men, >35 women);diabetes mellitus;dyslipidemia;hypertension;male gender;sedentary lifestyle;obesity (BMI >30kg/m2)     Objective:    Today's Vitals   08/11/20 0900  Weight: 220 lb (99.8 kg)  Height: 5\' 9"  (1.753 m)   Body mass index is 32.49 kg/m.  Advanced Directives 08/11/2020 08/01/2019 07/13/2018 02/24/2018 01/26/2017 09/25/2015  Does Patient Have a Medical Advance Directive? No Yes No No No No  Type of Advance Directive - Healthcare Power of [place];Living will - - - -  Copy of [place] in Chart? - No - copy requested - - - -  Would patient like information on creating a medical advance directive? Yes (MAU/Ambulatory/Procedural Areas - Information given) - No - Patient declined Yes (MAU/Ambulatory/Procedural  Areas - Information given) Yes (MAU/Ambulatory/Procedural Areas - Information given) No - patient declined information    Current Medications (verified) Outpatient Encounter Medications as of 08/11/2020  Medication Sig  . atorvastatin (LIPITOR) 40 MG tablet TAKE 1 TABLET BY MOUTH AT BEDTIME (Patient taking differently: Take 40 mg by mouth at bedtime. )  . Calcium Carbonate-Vitamin D (CALCIUM 600 + D PO) Take 1 tablet by mouth 2 (two) times daily.   . diclofenac Sodium (VOLTAREN) 1 % GEL Apply 2 g topically 4 (four) times daily. (Patient taking differently: Apply 2 g topically 4 (four) times daily as needed (pain). )  . enalapril (VASOTEC) 5 MG tablet Take 1 tablet (5 mg total) by mouth daily.  [name] 75 MCG tablet Take 1 tablet by mouth once daily  . fluorometholone (FML) 0.1 % ophthalmic suspension Place 1 drop into both eyes every 30 (thirty) days.   . Lancets (ONETOUCH ULTRASOFT) lancets Use as instructed  . latanoprost (XALATAN) 0.005 % ophthalmic solution Place 1 drop into both eyes at bedtime.   . magnesium 30 MG tablet Take 30 mg by mouth 2 (two) times daily.  . metFORMIN (GLUCOPHAGE) 1000 MG tablet Take 1 tablet by mouth twice daily (Patient taking differently: Take 1,000 mg by mouth 2 (two) times daily with a meal. )  . metoprolol tartrate (LOPRESSOR) 100 MG tablet Take 1 tablet by mouth twice daily (Patient taking differently: Take 100 mg by mouth 2 (two) times daily. )  . Multiple Vitamin (MULTIVITAMIN) capsule Take 1 capsule by mouth daily.    . Omega-3 Fatty Acids (FISH OIL) 1200 MG CAPS Take 3 capsules by mouth daily.  [name] VERIO test strip  USE 1 STRIP TO CHECK GLUCOSE TWICE DAILY AS DIRECTED  . tamsulosin (FLOMAX) 0.4 MG CAPS capsule Take 1 capsule (0.4 mg total) by mouth daily.  . timolol (TIMOPTIC) 0.5 % ophthalmic solution Place 1 drop into both eyes daily.   [name] Kitchen warfarin (COUMADIN) 2.5 MG tablet TAKE AS DIRECTED BY  COUMADIN  CLINIC   No facility-administered  encounter medications on file as of 08/11/2020.    Allergies (verified) Neomycin-bacitracin zn-polymyx, Penicillins, and Other   History: Past Medical History:  Diagnosis Date  . Arthritis 12/2005   left hand  . Arthritis    Left knee  . Blood transfusion without reported diagnosis   . Bursitis of knee    right knee  . CAD (coronary artery disease)   . Diabetes mellitus   . Diverticulosis   . Glaucoma    left eye  . Hyperlipidemia   . Hypertension   . Neuropathy    with pain  . Spinal stenosis 01/20/05  . Tubular adenoma of colon    Past Surgical History:  Procedure Laterality Date  . BACK SURGERY    . CATARACT EXTRACTION  08/11/09   right  . corneal endothelial Left 2013  . CORNEAL TRANSPLANT Left    eye  . CORONARY ANGIOPLASTY WITH STENT PLACEMENT    . EYE SURGERY    . SMALL INTESTINE SURGERY    . SPINE SURGERY    . VASECTOMY     Family History  Problem Relation Age of Onset  . Heart failure Mother   . Heart failure Father   . Stomach cancer Neg Hx   . Colon cancer Neg Hx   . Pancreatic cancer Neg Hx   . Throat cancer Neg Hx    Social History   Socioeconomic History  . Marital status: Divorced    Spouse name: Not on file  . Number of children: Not on file  . Years of education: Not on file  . Highest education level: Not on file  Occupational History  . Occupation: retired  Tobacco Use  . Smoking status: Never Smoker  . Smokeless tobacco: Never Used  Vaping Use  . Vaping Use: Never used  Substance and Sexual Activity  . Alcohol use: Yes    Alcohol/week: 0.0 standard drinks    Comment: Very seldom---socially  . Drug use: No  . Sexual activity: Not Currently    Partners: Female  Other Topics Concern  . Not on file  Social History Narrative   Volunteers at [employer] Determinants of Health   Financial Resource Strain: Low Risk   . Difficulty of Paying Living Expenses: Not hard at all  Food Insecurity: No Food Insecurity   . Worried About Charity fundraiser in the Last Year: Never true  . Ran Out of Food in the Last Year: Never true  Transportation Needs: No Transportation Needs  . Lack of Transportation (Medical): No  . Lack of Transportation (Non-Medical): No  Physical Activity: Inactive  . Days of Exercise per Week: 0 days  . Minutes of Exercise per Session: 0 min  Stress: No Stress Concern Present  . Feeling of Stress : Not at all  Social Connections: Moderately Isolated  . Frequency of Communication with Friends and Family: More than three times a week  . Frequency of Social Gatherings with Friends and Family: Once a week  . Attends Religious Services: Never  . Active Member of Clubs or Organizations: Yes  .  Attends Archivist Meetings: 1 to 4 times per year  . Marital Status: Divorced    Tobacco Counseling Counseling given: Not Answered   Clinical Intake:  Pre-visit preparation completed: Yes  Pain : No/denies pain     Nutritional Status: BMI > 30  Obese Nutritional Risks: None Diabetes: Yes CBG done?: No Did pt. bring in CBG monitor from home?: No (phone visit)  How often do you need to have someone help you when you read instructions, pamphlets, or other written materials from your doctor or pharmacy?: 1 - Never What is the last grade level you completed in school?: 12th grade  Diabetes:  Is the patient diabetic?  Yes  If diabetic, was a CBG obtained today?  No  Did the patient bring in their glucometer from home?  No phone visit How often do you monitor your CBG's? Daily.   Financial Strains and Diabetes Management:  Are you having any financial strains with the device, your supplies or your medication? No .  Does the patient want to be seen by Chronic Care Management for management of their diabetes?  No  Would the patient like to be referred to a Nutritionist or for Diabetic Management?  No   Diabetic Exams:  Diabetic Eye Exam: Completed 12/10/2019.    Diabetic Foot Exam: Completed 05/28/2020.   Interpreter Needed?: No  Information entered by :: Jenai Scaletta LPN   Activities of Daily Living In your present state of health, do you have any difficulty performing the following activities: 08/11/2020 04/03/2020  Hearing? Y N  Comment hearing aids -  Vision? N N  Difficulty concentrating or making decisions? N N  Walking or climbing stairs? N N  Dressing or bathing? N N  Doing errands, shopping? N N  Preparing Food and eating ? N -  Using the Toilet? N -  In the past six months, have you accidently leaked urine? N -  Do you have problems with loss of bowel control? N -  Managing your Medications? N -  Managing your Finances? N -  Housekeeping or managing your Housekeeping? N -  Some recent data might be hidden    Patient Care Team: [name], MD as PCP - General Jerline Pain, MD as PCP - Cardiology (Cardiology) Jerline Pain, MD as Consulting Physician (Cardiology) Pisek, [place] (Specialist) Gardiner Barefoot, DPM as Consulting Physician (Podiatry) Lyndee Hensen, PT as Physical Therapist (Physical Therapy)  Indicate any recent Medical Services you may have received from other than [facility] providers in the past year (date may be approximate).     Assessment:   This is a routine wellness examination for Delaney.  Hearing/Vision screen  Hearing Screening   125Hz  250Hz  500Hz  1000Hz  2000Hz  3000Hz  4000Hz  6000Hz  8000Hz   Right ear:           Left ear:           Comments: Bilateral hearing aids  Vision Screening Comments: Wears glasses Last eye exam-See Retina Specialist monthly Dr. Zigmund Daniel  Dietary issues and exercise activities discussed: Current Exercise Habits: The patient does not participate in regular exercise at present, Exercise limited by: None identified  Goals      Patient Stated   .  <enter goal here> (pt-stated)      Maintain current health by staying active.        Depression Screen PHQ 2/9 Scores 08/11/2020 04/03/2020 12/14/2019 12/05/2019 08/01/2019 08/01/2019 04/09/2019  PHQ - 2 Score 0 4  0 0 0 0 0  PHQ- 9 Score - 4 - 0 0 0 0    Fall Risk Fall Risk  08/11/2020 04/03/2020 12/14/2019 12/05/2019 08/01/2019  Falls in the past year? 0 0 0 1 0  Comment - - - - -  Number falls in past yr: 0 0 0 0 0  Injury with Fall? 0 0 0 0 0  Risk Factor Category  - - - - -  Risk for fall due to : - - - - -  Risk for fall due to: Comment - - - - -  Follow up Falls prevention discussed Falls evaluation completed Falls evaluation completed Falls evaluation completed Falls evaluation completed    Any stairs in or around the home? No  Home free of loose throw rugs in walkways, pet beds, electrical cords, etc? Yes  Adequate lighting in your home to reduce risk of falls? Yes   ASSISTIVE DEVICES UTILIZED TO PREVENT FALLS:  Life alert? No  Use of a cane, walker or w/c? No  Grab bars in the bathroom? Yes  Shower chair or bench in shower? No  Elevated toilet seat or a handicapped toilet? No   TIMED UP AND GO:  Was the test performed? No . Phone visit   Cognitive Function:No cognitive impairment noted MMSE - Mini Mental State Exam 02/24/2018 09/25/2015  Orientation to time 5 5  Orientation to Place 5 5  Registration 3 3  Attention/ Calculation 5 5  Recall 3 2  Language- name 2 objects 2 2  Language- repeat 1 1  Language- follow 3 step command 3 3  Language- read & follow direction 1 1  Write a sentence 1 1  Copy design 1 1  Total score 30 29     6CIT Screen 08/11/2020  What Year? 0 points  What month? 0 points  What time? 0 points  Count back from 20 0 points  Months in reverse 0 points  Repeat phrase 0 points  Total Score 0    Immunizations Immunization History  Administered Date(s) Administered  . Fluad Quad(high Dose 65+) 07/14/2019, 07/18/2020  . Influenza Split 08/06/2015  . Influenza Whole 08/14/2007, 07/23/2009, 07/10/2010, 08/11/2016  .  Influenza, High Dose Seasonal PF 07/30/2016  . Influenza,inj,Quad PF,6+ Mos 08/14/2013, 08/13/2014, 08/10/2018  . Influenza,inj,quad, With Preservative 11/11/2017  . Influenza-Unspecified 08/06/2015, 08/04/2017  . PFIZER SARS-COV-2 Vaccination 12/04/2019, 12/25/2019  . Pneumococcal Conjugate-13 01/16/2015  . Pneumococcal Polysaccharide-23 12/22/2009, 01/26/2017  . Pneumococcal-Unspecified 10/11/2016  . Tdap 08/28/2014  . Zoster 08/20/2010  . Zoster Recombinat (Shingrix) 10/31/2018, 04/06/2019    TDAP status: Up to date   Flu Vaccine status: Up to date   Pneumococcal vaccine status: Up to date   Covid-19 vaccine status: Completed vaccines  Qualifies for Shingles Vaccine? No   Zostavax completed Yes   Shingrix Completed?: Yes  Screening Tests Health Maintenance  Topic Date Due  . OPHTHALMOLOGY EXAM  12/09/2020  . HEMOGLOBIN A1C  01/04/2021  . FOOT EXAM  05/28/2021  . TETANUS/TDAP  08/28/2024  . INFLUENZA VACCINE  Completed  . COVID-19 Vaccine  Completed  . PNA vac Low Risk Adult  Completed    Health Maintenance  There are no preventive care reminders to display for this patient.  Colorectal cancer screening: No longer required.   Lung Cancer Screening: (Low Dose CT Chest recommended if Age 86-80 years, 30 pack-year currently smoking OR have quit w/in 15years.) does not qualify.     Additional Screening:  Hepatitis C  Screening: does not qualify  Vision Screening: Recommended annual ophthalmology exams for early detection of glaucoma and other disorders of the eye. Is the patient up to date with their annual eye exam?  Yes  Who is the provider or what is the name of the office in which the patient attends annual eye exams? Dr. Zigmund Daniel   Dental Screening: Recommended annual dental exams for proper oral hygiene  Community Resource Referral / Chronic Care Management: CRR required this visit?  No   CCM required this visit?  No      Plan:     I have  personally reviewed and noted the following in the patient's chart:   . Medical and social history . Use of alcohol, tobacco or illicit drugs  . Current medications and supplements . Functional ability and status . Nutritional status . Physical activity . Advanced directives . List of other physicians . Hospitalizations, surgeries, and ER visits in previous 12 months . Vitals . Screenings to include cognitive, depression, and falls . Referrals and appointments  In addition, I have reviewed and discussed with patient certain preventive protocols, quality metrics, and best practice recommendations. A written personalized care plan for preventive services as well as general preventive health recommendations were provided to patient.   Due to this being a telephonic visit, the after visit summary with patients personalized plan was offered to patient via mail or my-chart.  Patient would like to access on my-chart.   Marta Antu, LPN   [id number]  Nurse Health Advisor  Nurse Notes: None

## 2020-08-11 ENCOUNTER — Ambulatory Visit: Payer: Medicare HMO

## 2020-08-11 ENCOUNTER — Ambulatory Visit (INDEPENDENT_AMBULATORY_CARE_PROVIDER_SITE_OTHER): Payer: Medicare HMO

## 2020-08-11 VITALS — Ht 69.0 in | Wt 220.0 lb

## 2020-08-11 DIAGNOSIS — R69 Illness, unspecified: Secondary | ICD-10-CM | POA: Diagnosis not present

## 2020-08-11 DIAGNOSIS — Z0000 Encounter for general adult medical examination without abnormal findings: Secondary | ICD-10-CM

## 2020-08-11 NOTE — Patient Instructions (Signed)
Marvin Foster , Thank you for taking time to complete your Medicare Wellness Visit. I appreciate your ongoing commitment to your health goals. Please review the following plan we discussed and let me know if I can assist you in the future.   Screening recommendations/referrals: Colonoscopy: No longer required Recommended yearly ophthalmology/optometry visit for glaucoma screening and checkup Recommended yearly dental visit for hygiene and checkup  Vaccinations: Influenza vaccine: Up to Date Pneumococcal vaccine: Completed vaccines Tdap vaccine: Up to Date-Due-08/28/2024 Shingles vaccine: Completed vaccines Covid-19: Completed vaccines  Advanced directives: Information mailed today  Conditions/risks identified: See problem list  Next appointment: Follow up in one year for your annual wellness visit. 08/17/2021 @ 9:00am  Preventive Care 84 Years and Older, Male Preventive care refers to lifestyle choices and visits with your health care provider that can promote health and wellness. What does preventive care include?  A yearly physical exam. This is also called an annual well check.  Dental exams once or twice a year.  Routine eye exams. Ask your health care provider how often you should have your eyes checked.  Personal lifestyle choices, including:  Daily care of your teeth and gums.  Regular physical activity.  Eating a healthy diet.  Avoiding tobacco and drug use.  Limiting alcohol use.  Practicing safe sex.  Taking low doses of aspirin every day.  Taking vitamin and mineral supplements as recommended by your health care provider. What happens during an annual well check? The services and screenings done by your health care provider during your annual well check will depend on your age, overall health, lifestyle risk factors, and family history of disease. Counseling  Your health care provider may ask you questions about your:  Alcohol use.  Tobacco use.  Drug  use.  Emotional well-being.  Home and relationship well-being.  Sexual activity.  Eating habits.  History of falls.  Memory and ability to understand (cognition).  Work and work Statistician. Screening  You may have the following tests or measurements:  Height, weight, and BMI.  Blood pressure.  Lipid and cholesterol levels. These may be checked every 5 years, or more frequently if you are over 84 years old.  Skin check.  Lung cancer screening. You may have this screening every year starting at age 84 if you have a 30-pack-year history of smoking and currently smoke or have quit within the past 15 years.  Fecal occult blood test (FOBT) of the stool. You may have this test every year starting at age 84.  Flexible sigmoidoscopy or colonoscopy. You may have a sigmoidoscopy every 5 years or a colonoscopy every 10 years starting at age 84.  Prostate cancer screening. Recommendations will vary depending on your family history and other risks.  Hepatitis C blood test.  Hepatitis B blood test.  Sexually transmitted disease (STD) testing.  Diabetes screening. This is done by checking your blood sugar (glucose) after you have not eaten for a while (fasting). You may have this done every 1-3 years.  Abdominal aortic aneurysm (AAA) screening. You may need this if you are a current or former smoker.  Osteoporosis. You may be screened starting at age 84 if you are at high risk. Talk with your health care provider about your test results, treatment options, and if necessary, the need for more tests. Vaccines  Your health care provider may recommend certain vaccines, such as:  Influenza vaccine. This is recommended every year.  Tetanus, diphtheria, and acellular pertussis (Tdap, Td) vaccine. You may need  a Td booster every 10 years.  Zoster vaccine. You may need this after age 84.  Pneumococcal 13-valent conjugate (PCV13) vaccine. One dose is recommended after age  84.  Pneumococcal polysaccharide (PPSV23) vaccine. One dose is recommended after age 84. Talk to your health care provider about which screenings and vaccines you need and how often you need them. This information is not intended to replace advice given to you by your health care provider. Make sure you discuss any questions you have with your health care provider. Document Released: 10/24/2015 Document Revised: 06/16/2016 Document Reviewed: 07/29/2015 Elsevier Interactive Patient Education  2017 [place] Prevention in the Home Falls can cause injuries. They can happen to people of all ages. There are many things you can do to make your home safe and to help prevent falls. What can I do on the outside of my home?  Regularly fix the edges of walkways and driveways and fix any cracks.  Remove anything that might make you trip as you walk through a door, such as a raised step or threshold.  Trim any bushes or trees on the path to your home.  Use bright outdoor lighting.  Clear any walking paths of anything that might make someone trip, such as rocks or tools.  Regularly check to see if handrails are loose or broken. Make sure that both sides of any steps have handrails.  Any raised decks and porches should have guardrails on the edges.  Have any leaves, snow, or ice cleared regularly.  Use sand or salt on walking paths during winter.  Clean up any spills in your garage right away. This includes oil or grease spills. What can I do in the bathroom?  Use night lights.  Install grab bars by the toilet and in the tub and shower. Do not use towel bars as grab bars.  Use non-skid mats or decals in the tub or shower.  If you need to sit down in the shower, use a plastic, non-slip stool.  Keep the floor dry. Clean up any water that spills on the floor as soon as it happens.  Remove soap buildup in the tub or shower regularly.  Attach bath mats securely with double-sided  non-slip rug tape.  Do not have throw rugs and other things on the floor that can make you trip. What can I do in the bedroom?  Use night lights.  Make sure that you have a light by your bed that is easy to reach.  Do not use any sheets or blankets that are too big for your bed. They should not hang down onto the floor.  Have a firm chair that has side arms. You can use this for support while you get dressed.  Do not have throw rugs and other things on the floor that can make you trip. What can I do in the kitchen?  Clean up any spills right away.  Avoid walking on wet floors.  Keep items that you use a lot in easy-to-reach places.  If you need to reach something above you, use a strong step stool that has a grab bar.  Keep electrical cords out of the way.  Do not use floor polish or wax that makes floors slippery. If you must use wax, use non-skid floor wax.  Do not have throw rugs and other things on the floor that can make you trip. What can I do with my stairs?  Do not leave any items on the  stairs.  Make sure that there are handrails on both sides of the stairs and use them. Fix handrails that are broken or loose. Make sure that handrails are as long as the stairways.  Check any carpeting to make sure that it is firmly attached to the stairs. Fix any carpet that is loose or worn.  Avoid having throw rugs at the top or bottom of the stairs. If you do have throw rugs, attach them to the floor with carpet tape.  Make sure that you have a light switch at the top of the stairs and the bottom of the stairs. If you do not have them, ask someone to add them for you. What else can I do to help prevent falls?  Wear shoes that:  Do not have high heels.  Have rubber bottoms.  Are comfortable and fit you well.  Are closed at the toe. Do not wear sandals.  If you use a stepladder:  Make sure that it is fully opened. Do not climb a closed stepladder.  Make sure that both  sides of the stepladder are locked into place.  Ask someone to hold it for you, if possible.  Clearly mark and make sure that you can see:  Any grab bars or handrails.  First and last steps.  Where the edge of each step is.  Use tools that help you move around (mobility aids) if they are needed. These include:  Canes.  Walkers.  Scooters.  Crutches.  Turn on the lights when you go into a dark area. Replace any light bulbs as soon as they burn out.  Set up your furniture so you have a clear path. Avoid moving your furniture around.  If any of your floors are uneven, fix them.  If there are any pets around you, be aware of where they are.  Review your medicines with your doctor. Some medicines can make you feel dizzy. This can increase your chance of falling. Ask your doctor what other things that you can do to help prevent falls. This information is not intended to replace advice given to you by your health care provider. Make sure you discuss any questions you have with your health care provider. Document Released: 07/24/2009 Document Revised: 03/04/2016 Document Reviewed: 11/01/2014 Elsevier Interactive Patient Education  2017 [employer].

## 2020-08-15 DIAGNOSIS — H02236 Paralytic lagophthalmos left eye, unspecified eyelid: Secondary | ICD-10-CM | POA: Diagnosis not present

## 2020-08-20 ENCOUNTER — Other Ambulatory Visit: Payer: Self-pay

## 2020-08-20 ENCOUNTER — Ambulatory Visit: Payer: Medicare HMO | Admitting: *Deleted

## 2020-08-20 DIAGNOSIS — I4891 Unspecified atrial fibrillation: Secondary | ICD-10-CM

## 2020-08-20 DIAGNOSIS — Z5181 Encounter for therapeutic drug level monitoring: Secondary | ICD-10-CM

## 2020-08-20 LAB — POCT INR: INR: 2.3 (ref 2.0–3.0)

## 2020-08-20 NOTE — Patient Instructions (Signed)
Description   Continue taking 1 tablet daily except for 1.5 tablets on Wednesdays. Repeat INR in 8 weeks.  Call us with any medication changes or concerns # [phone] [facility] Main # [phone].     

## 2020-08-22 ENCOUNTER — Telehealth: Payer: Self-pay | Admitting: Family Medicine

## 2020-08-22 NOTE — Progress Notes (Signed)
  Chronic Care Management   Outreach Note  08/22/2020 Name: RISHIKESH KHACHATRYAN MRN: [id number] DOB: [birth year]  Referred by: [name], MD Reason for referral : No chief complaint on file.   An unsuccessful telephone outreach was attempted today. The patient was referred to the pharmacist for assistance with care management and care coordination.   Follow Up Plan:   Lauretta Grill Upstream Scheduler

## 2020-08-22 NOTE — Chronic Care Management (AMB) (Signed)
  Chronic Care Management   Note  08/22/2020 Name: DELFIN SQUILLACE MRN: [id number] DOB: 07/13/1934  Marvin Foster is a 86 y.o. year old male who is a primary care patient of Birdie Riddle, Aundra Millet, MD. I reached out to Marvin Foster by phone today in response to a referral sent by [name]'s PCP, [name], MD.   Marvin Foster was given information about Chronic Care Management services today including:  1. CCM service includes personalized support from designated clinical staff supervised by his physician, including individualized plan of care and coordination with other care providers 2. 24/7 contact phone numbers for assistance for urgent and routine care needs. 3. Service will only be billed when office clinical staff spend 20 minutes or more in a month to coordinate care. 4. Only one practitioner may furnish and bill the service in a calendar month. 5. The patient may stop CCM services at any time (effective at the end of the month) by phone call to the office staff.   Patient agreed to services and verbal consent obtained.   Follow up plan:   Lauretta Grill Upstream Scheduler

## 2020-08-28 ENCOUNTER — Encounter (INDEPENDENT_AMBULATORY_CARE_PROVIDER_SITE_OTHER): Payer: Medicare HMO | Admitting: Ophthalmology

## 2020-08-28 ENCOUNTER — Other Ambulatory Visit: Payer: Self-pay

## 2020-08-28 DIAGNOSIS — H353221 Exudative age-related macular degeneration, left eye, with active choroidal neovascularization: Secondary | ICD-10-CM

## 2020-08-28 DIAGNOSIS — H43812 Vitreous degeneration, left eye: Secondary | ICD-10-CM | POA: Diagnosis not present

## 2020-08-28 DIAGNOSIS — H35032 Hypertensive retinopathy, left eye: Secondary | ICD-10-CM

## 2020-08-28 DIAGNOSIS — H35371 Puckering of macula, right eye: Secondary | ICD-10-CM

## 2020-08-28 DIAGNOSIS — I10 Essential (primary) hypertension: Secondary | ICD-10-CM

## 2020-08-28 DIAGNOSIS — H353112 Nonexudative age-related macular degeneration, right eye, intermediate dry stage: Secondary | ICD-10-CM | POA: Diagnosis not present

## 2020-08-28 IMAGING — DX DG RIBS 2V*L*
3 series · 3 of 3 positions shown · non-contrast
Comparison: 06/02/2005

CLINICAL DATA: Upper left rib pain.  Fall.

EXAM:
LEFT RIBS - 2 VIEW

[shoulder ap (1 of 2)]
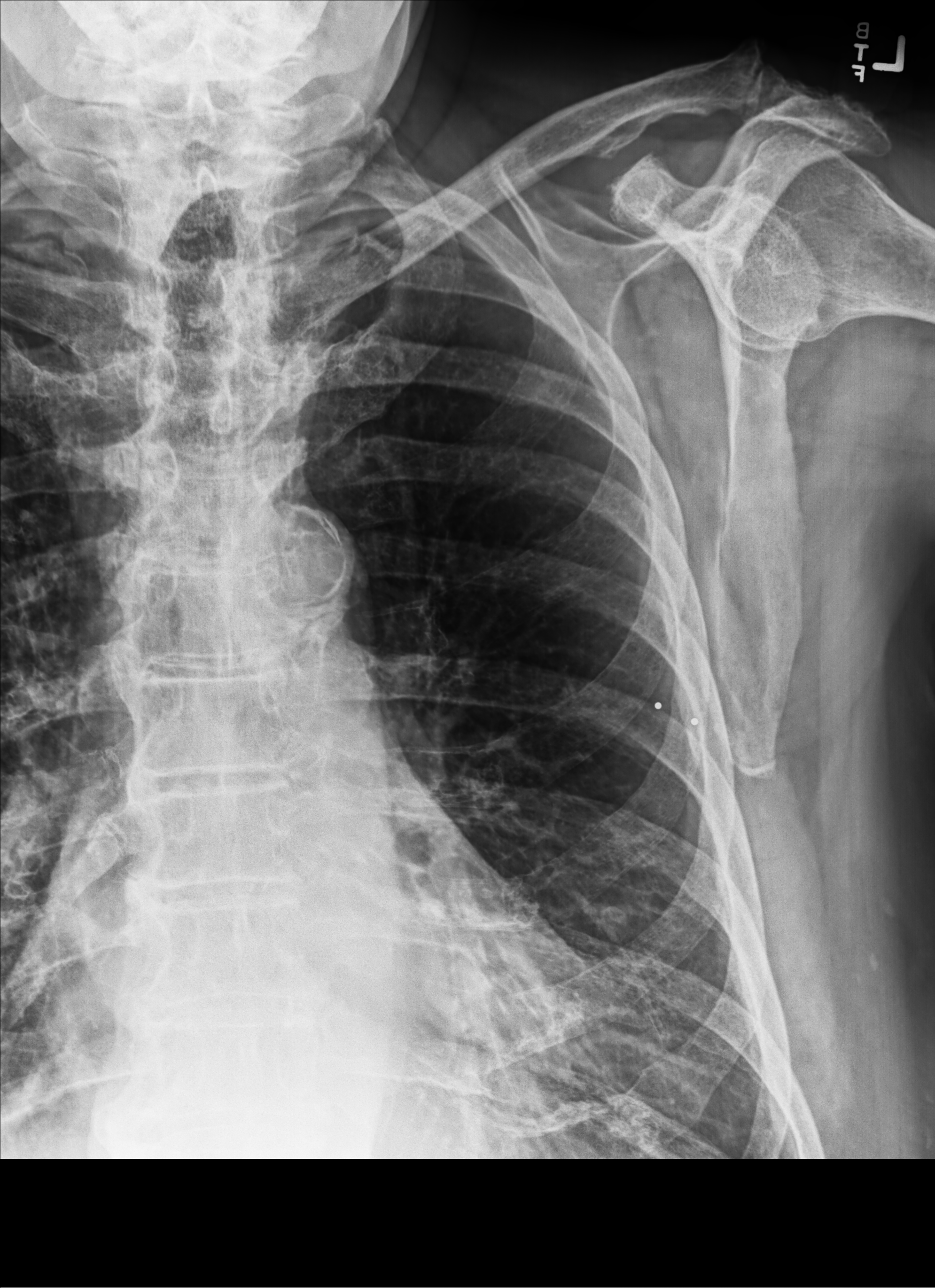

[shoulder ap (2 of 2)]
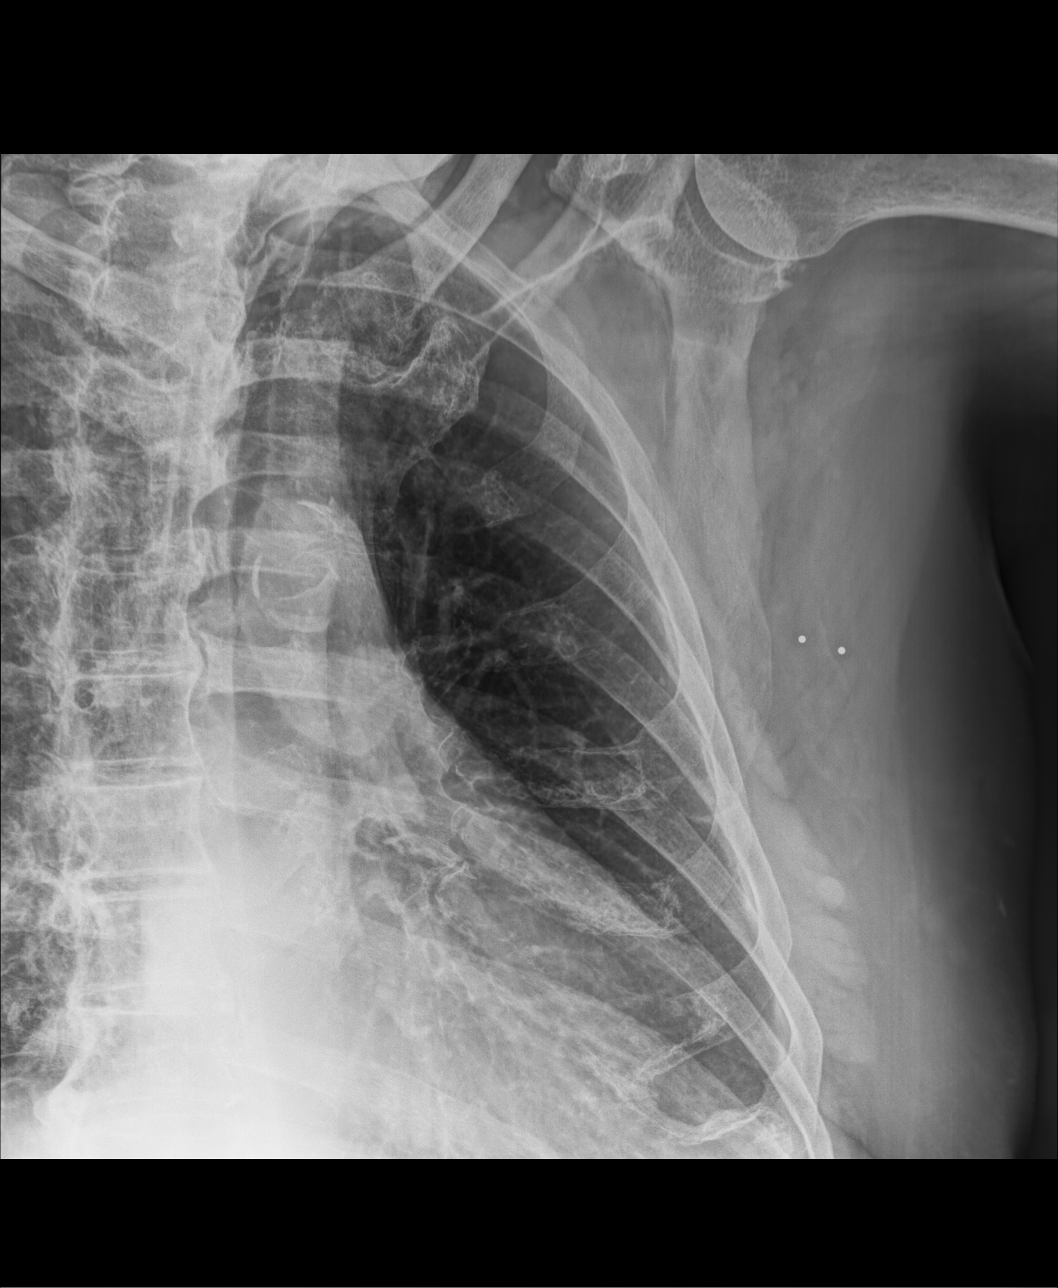

[oblique ribs oblique]
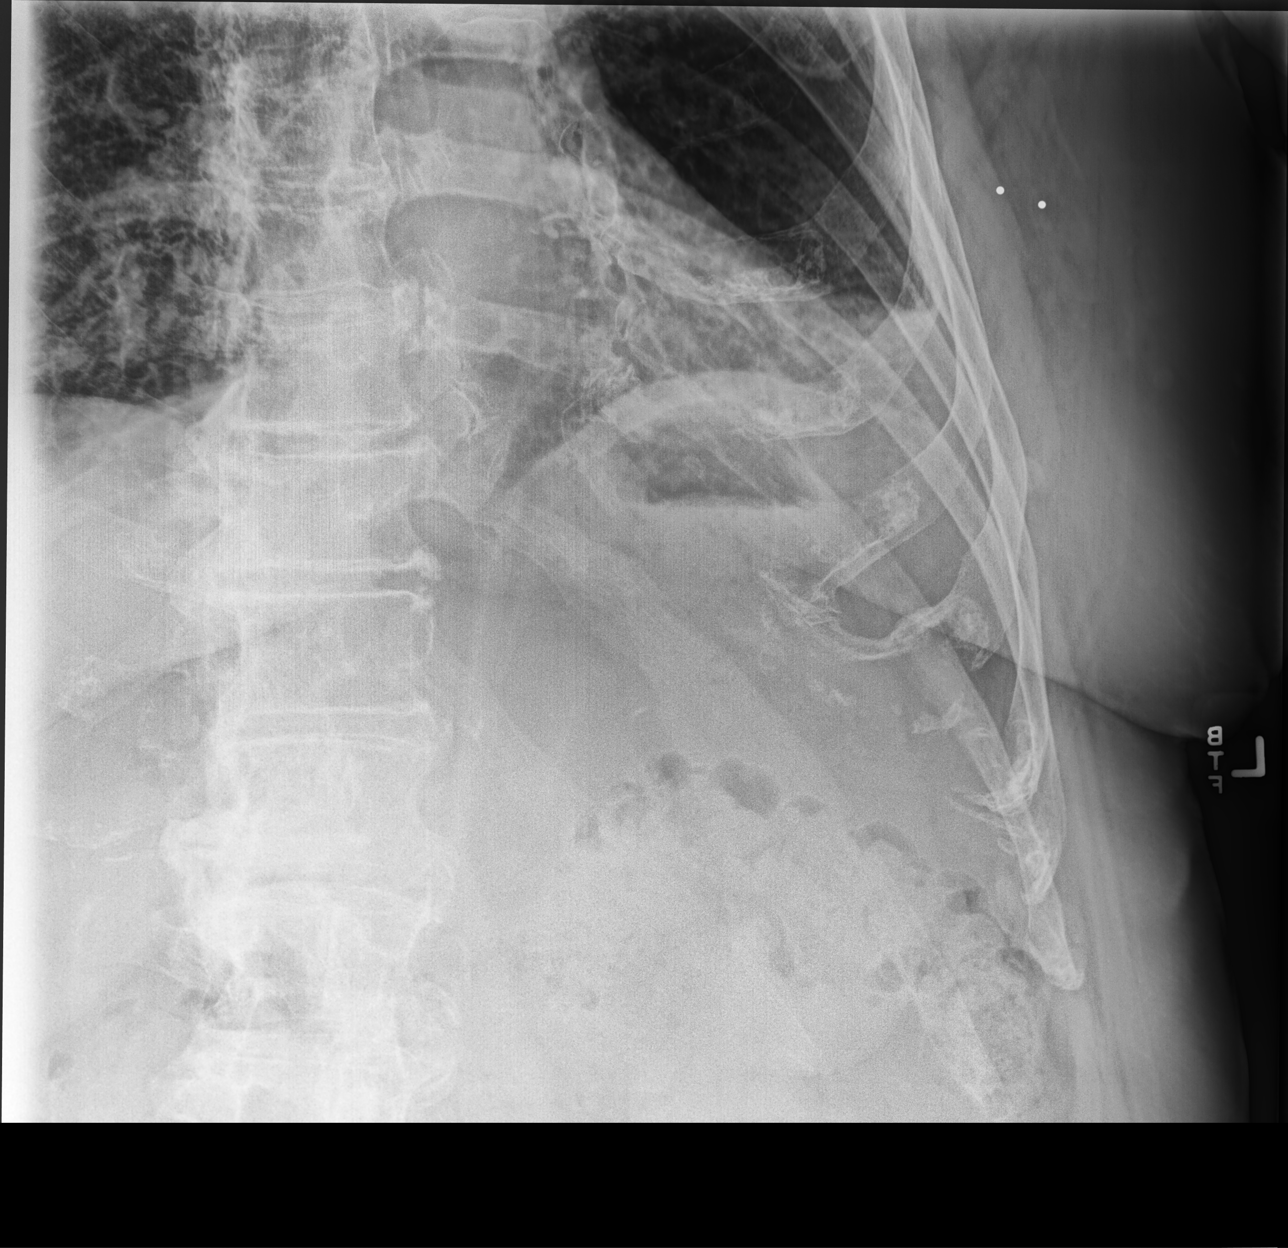

[3 of 3 positions shown; findings below may reference images not displayed]

FINDINGS: No visible fracture or focal bone lesion. No visible pneumothorax or
effusion on the left. Atherosclerotic change in the aorta.
IMPRESSION: No visible rib fracture.

## 2020-09-03 ENCOUNTER — Other Ambulatory Visit: Payer: Self-pay | Admitting: Family Medicine

## 2020-09-08 ENCOUNTER — Telehealth: Payer: Self-pay

## 2020-09-08 NOTE — Progress Notes (Signed)
Chronic Care Management Pharmacy Assistant   Name: Marvin Foster  MRN: [id number] DOB: [birth year]  Reason for Encounter: Medication Review/ Initial Visit     [name],  86 y.o. , male presents for their Initial CCM visit with the clinical pharmacist via telephone.  PCP : [name], MD  Allergies:   Allergies  Allergen Reactions  . Neomycin-Bacitracin Zn-Polymyx Rash  . Penicillins Other (See Comments) and Rash    Pt states this was diagnosed years ago, unsure as to reaction. Will not use.   . Other Rash    Medications: Outpatient Encounter Medications as of 09/08/2020  Medication Sig  . atorvastatin (LIPITOR) 40 MG tablet TAKE 1 TABLET BY MOUTH AT BEDTIME (Patient taking differently: Take 40 mg by mouth at bedtime. )  . Calcium Carbonate-Vitamin D (CALCIUM 600 + D PO) Take 1 tablet by mouth 2 (two) times daily.   . diclofenac Sodium (VOLTAREN) 1 % GEL Apply 2 g topically 4 (four) times daily. (Patient taking differently: Apply 2 g topically 4 (four) times daily as needed (pain). )  . enalapril (VASOTEC) 5 MG tablet Take 1 tablet (5 mg total) by mouth daily.  [name] 75 MCG tablet Take 1 tablet by mouth once daily  . fluorometholone (FML) 0.1 % ophthalmic suspension Place 1 drop into both eyes every 30 (thirty) days.   . Lancets (ONETOUCH ULTRASOFT) lancets Use as instructed  . latanoprost (XALATAN) 0.005 % ophthalmic solution Place 1 drop into both eyes at bedtime.   . magnesium 30 MG tablet Take 30 mg by mouth 2 (two) times daily.  . metFORMIN (GLUCOPHAGE) 1000 MG tablet Take 1 tablet by mouth twice daily  . metoprolol tartrate (LOPRESSOR) 100 MG tablet Take 1 tablet by mouth twice daily  . Multiple Vitamin (MULTIVITAMIN) capsule Take 1 capsule by mouth daily.    . Omega-3 Fatty Acids (FISH OIL) 1200 MG CAPS Take 3 capsules by mouth daily.  [name] VERIO test strip USE 1 STRIP TO CHECK GLUCOSE TWICE DAILY AS DIRECTED  . tamsulosin (FLOMAX) 0.4 MG  CAPS capsule Take 1 capsule (0.4 mg total) by mouth daily.  . timolol (TIMOPTIC) 0.5 % ophthalmic solution Place 1 drop into both eyes daily.   [name] Kitchen warfarin (COUMADIN) 2.5 MG tablet TAKE AS DIRECTED BY  COUMADIN  CLINIC   No facility-administered encounter medications on file as of 09/08/2020.    Current Diagnosis: Patient Active Problem List   Diagnosis Date Noted  . Acute respiratory failure due to COVID-19 ([place]) 07/07/2020  . Coagulation defect ([place]) 05/28/2020  . Pain due to onychomycosis of toenail of left foot 05/28/2020  . Hypothyroid 09/21/2018  . Hyperlipidemia 08/05/2015  . Coronary artery disease due to lipid rich plaque 08/05/2015  . Hx of colonic polyps 06/12/2015  . Chronic anticoagulation 06/12/2015  . 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 Dermatitis of ear canal 05/07/2015  . Encounter for therapeutic drug monitoring 11/06/2013  . Atrial fibrillation ([place]) 07/24/2013  . Long term current use of anticoagulant therapy 07/24/2013  . Bilateral leg weakness 06/07/2013  . 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 Scrotal mass 03/07/2012  . General medical examination 02/28/2012  . ANEMIA 02/18/2010  . HEARING LOSS 12/22/2009  . ASPARTATE AMINOTRANSFERASE, SERUM, ELEVATED 11/04/2008  . LEG CRAMPS, NOCTURNAL 08/16/2008  . Diabetes mellitus type II, controlled ([place]) 05/12/2007  . Essential hypertension 03/27/2007  . CORONARY ARTERY DISEASE 03/27/2007     Have you seen  any other providers since your last visit?          Patient stated he seen his eye doctor two weeks ago.  Any changes in your medications or health?          Patient stated no changes in medications or health.  Any side effects from any medications?          Patient stated no side effects from any medications.  Do you have an symptoms or problems not managed by your medications?           Patient stated no symptoms or problems managed by medications.  Any concerns about your  health right now?           Patient stated no concerns about his health at this time .  Has your provider asked that you check blood pressure, blood sugar, or follow special diet at home?            Patient stated he does not check his blood pressure , blood sugar or follow a special diet at home.  Do you get any type of exercise on a regular basis?            Patient stated he does not get any type of exercise on a regular basis.  Can you think of a goal you would like to reach for your health?            Patient stated he can not think of a goal at this moment  Do you have any problems getting your medications?           Patient stated no problems getting medications.  Is there anything that you would like to discuss during the appointment?            Patient stated he can not think of anything at this moment but stated maybe later he will come up with something.  Please bring medications and supplements to appointment    Georgiana Shore ,[facility] Clinical Pharmacist Assistant [phone]   Follow-Up:  Pharmacist Review

## 2020-09-09 ENCOUNTER — Other Ambulatory Visit: Payer: Self-pay | Admitting: Family Medicine

## 2020-09-11 ENCOUNTER — Ambulatory Visit: Payer: Medicare HMO

## 2020-09-11 DIAGNOSIS — E785 Hyperlipidemia, unspecified: Secondary | ICD-10-CM

## 2020-09-11 DIAGNOSIS — E1139 Type 2 diabetes mellitus with other diabetic ophthalmic complication: Secondary | ICD-10-CM

## 2020-09-11 DIAGNOSIS — I10 Essential (primary) hypertension: Secondary | ICD-10-CM

## 2020-09-11 NOTE — Progress Notes (Signed)
Chronic Care Management Pharmacy  Name: Marvin Foster  MRN: [id number]   DOB: 1934-07-05  Chief Complaint/ HPI Marvin Foster, 86 y.o., male, presents for their initial CCM visit with the clinical pharmacist via telephone due to COVID-19 pandemic .  PCP : [name], MD Encounter Diagnoses  Name Primary?  . Hyperlipidemia, unspecified hyperlipidemia type Yes  . Essential hypertension   . 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 9/26-9/28 (hospitalized): COVID pneumonia, hypoxic resp failure.   Patient Active Problem List   Diagnosis Date Noted  . Acute respiratory failure due to COVID-19 ([place]) 07/07/2020  . Coagulation defect ([place]) 05/28/2020  . Pain due to onychomycosis of toenail of left foot 05/28/2020  . Hypothyroid 09/21/2018  . Hyperlipidemia 08/05/2015  . Coronary artery disease due to lipid rich plaque 08/05/2015  . Hx of colonic polyps 06/12/2015  . Chronic anticoagulation 06/12/2015  . 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 Dermatitis of ear canal 05/07/2015  . Encounter for therapeutic drug monitoring 11/06/2013  . Atrial fibrillation ([place]) 07/24/2013  . Long term current use of anticoagulant therapy 07/24/2013  . Bilateral leg weakness 06/07/2013  . 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 Scrotal mass 03/07/2012  . General medical examination 02/28/2012  . ANEMIA 02/18/2010  . HEARING LOSS 12/22/2009  . ASPARTATE AMINOTRANSFERASE, SERUM, ELEVATED 11/04/2008  . LEG CRAMPS, NOCTURNAL 08/16/2008  . Diabetes mellitus type II, controlled ([place]) 05/12/2007  . Essential hypertension 03/27/2007  . CORONARY ARTERY DISEASE 03/27/2007   Past Surgical History:  Procedure  Laterality Date  . BACK SURGERY    . CATARACT EXTRACTION  08/11/09   right  . corneal endothelial Left 2013  . CORNEAL TRANSPLANT Left    eye  . CORONARY ANGIOPLASTY WITH STENT PLACEMENT    . EYE SURGERY    . SMALL INTESTINE SURGERY    . SPINE SURGERY    . VASECTOMY     Family History  Problem Relation Age of Onset  . Heart failure Mother   . Heart failure Father   . Stomach cancer Neg Hx   . Colon cancer Neg Hx   . Pancreatic cancer Neg Hx   . Throat cancer Neg Hx    Allergies  Allergen Reactions  . Neomycin-Bacitracin Zn-Polymyx Rash  . Penicillins Other (See Comments) and Rash    Pt states this was diagnosed years ago, unsure as to reaction. Will not use.   . Other Rash   Outpatient Encounter Medications as of 09/11/2020  Medication Sig  . atorvastatin (LIPITOR) 40 MG tablet TAKE 1 TABLET BY MOUTH AT BEDTIME  . Calcium Carbonate-Vitamin D (CALCIUM 600 + D PO) Take 1 tablet by mouth 2 (two) times daily.   . enalapril (VASOTEC) 5 MG tablet Take 1 tablet (5 mg total) by mouth daily.  [name] 75 MCG tablet Take 1 tablet by mouth once daily  . fluorometholone (FML) 0.1 % ophthalmic suspension Place 1 drop into both eyes every 30 (thirty) days.   [name] Kitchen latanoprost (XALATAN) 0.005 % ophthalmic solution Place 1 drop into both eyes at bedtime.   . magnesium 30 MG tablet Take 250 mg by mouth in the morning.   . metFORMIN (GLUCOPHAGE) 1000 MG tablet Take 1  tablet by mouth twice daily  . metoprolol tartrate (LOPRESSOR) 100 MG tablet Take 1 tablet by mouth twice daily  . Multiple Vitamin (MULTIVITAMIN) capsule Take 1 capsule by mouth daily.    . Omega-3 Fatty Acids (FISH OIL) 1200 MG CAPS Take 3 capsules by mouth daily.  . tamsulosin (FLOMAX) 0.4 MG CAPS capsule Take 1 capsule (0.4 mg total) by mouth daily.  . timolol (TIMOPTIC) 0.5 % ophthalmic solution Place 1 drop into both eyes daily.   [name] Kitchen warfarin (COUMADIN) 2.5 MG tablet TAKE AS DIRECTED BY  COUMADIN  CLINIC  . diclofenac Sodium  (VOLTAREN) 1 % GEL Apply 2 g topically 4 (four) times daily. (Patient taking differently: Apply 2 g topically 4 (four) times daily as needed (pain). )  . Lancets (ONETOUCH ULTRASOFT) lancets Use as instructed  . ONETOUCH VERIO test strip USE 1 STRIP TO CHECK GLUCOSE TWICE DAILY AS DIRECTED   No facility-administered encounter medications on file as of 09/11/2020.   Patient Care Team    Relationship Specialty Notifications Start End  [name], MD PCP - General   09/23/10   Jerline Pain, MD PCP - Cardiology Cardiology Admissions 11/01/18   Jerline Pain, MD Consulting Physician Cardiology  05/09/14   [facility]    01/26/17   [facility]    01/26/17   [facility], [place]  Specialist  01/26/17   Gardiner Barefoot, [facility] Consulting Physician Podiatry  02/24/18   Lyndee Hensen, PT Physical Therapist Physical Therapy  07/20/18   Madelin Rear, [facility] Pharmacist Pharmacist  08/22/20    Comment: [phone]   Current Diagnosis/Assessment: Goals Addressed            This Visit's Progress   . PharmD Care Plan       CARE PLAN ENTRY (see longitudinal plan of care for additional care plan information)  Current Barriers:  . Chronic Disease Management support, education, and care coordination needs related to Hypertension, Hyperlipidemia, and Diabetes   Hypertension BP Readings from Last 3 Encounters:  07/18/20 121/86  07/11/20 (!) 154/91  07/10/20 (!) 147/78   . Pharmacist Clinical Goal(s): o Over the next 365 days, patient will work with PharmD and providers to maintain BP goal <140/90 . Current regimen:  o Enalapril 5 mg once daily o Metoprolol tartrate 100 mg twice daily (heart rate control in atrial fibrillation, some blood pressure lowering) . Interventions: o Reviewed diet/exercise - Maintain a healthy weight and exercise regularly, as directed by your health care provider. Eat healthy foods, such as: Lean proteins, complex carbohydrates, fresh fruits and vegetables, low-fat  dairy products, healthy fats. . Patient self care activities - Over the next 365 days, patient will: o Check BP at least once every 1-2 weeks, document, and provide at future appointments o Ensure daily salt intake < 2300 mg/day  Hyperlipidemia Lab Results  Component Value Date/Time   LDLCALC 41 03/27/2020 07:34 AM   . Pharmacist Clinical Goal(s): o Over the next 365 days, patient will work with PharmD and providers to maintain LDL goal < 70 . Current regimen:  o Atorvastatin 40 mg once daily . Interventions: o Reviewed side effects - none noted at present! . Patient self care activities - Over the next 365 days, patient will: o Continue current management  Diabetes Lab Results  Component Value Date/Time   HGBA1C 6.8 (H) 07/07/2020 03:04 AM   HGBA1C 6.8 (H) 03/27/2020 07:34 AM   . Pharmacist Clinical Goal(s): o Over the next 365 days, patient will  work with PharmD and providers to maintain A1c goal <7% . Current regimen:  o Metformin 1000 mg twice daily with meals. . Interventions: o Reviewed diet - Maintain a healthy weight and exercise regularly, as directed by your health care provider. Eat healthy foods, such as: Lean proteins, complex carbohydrates, fresh fruits and vegetables, low-fat dairy products, healthy fats. . Patient self care activities - Over the next 365 days, patient will: o Check blood sugar daily, document, and provide at future appointments o Contact provider with any episodes of hypoglycemia  Medication management . Pharmacist Clinical Goal(s): o Over the next 365 days, patient will work with PharmD and providers to maintain optimal medication adherence . Current pharmacy: [facility] . Interventions o Comprehensive medication review performed. o Continue current medication management strategy . Patient self care activities - Over the next 365 days, patient will: o Take medications as prescribed o Report any questions or concerns to PharmD and/or  provider(s) Initial goal documentation.      Hypertension   BP goal <140/90  BP Readings from Last 3 Encounters:  07/18/20 121/86  07/11/20 (!) 154/91  07/10/20 (!) 147/78   Patient checks BP at home: n/a. Walks 2x/week at volunteer job. Sausage egg and cheese for breakfast, doesn't eat a whole lot throughout the day, maybe 2x. Patient is currently at goal on the following medications:  . Enalapril 5 mg once daily in AM . Metoprolol tartrate 100 mg every morning and night   We discussed diet and exercise extensively - reviewed some pool exercises such as water walking and discussed silversneakers. Handout of local fitness centers sent through mail.   Plan  Continue current medications.  Diabetes   A1c goal < 7%  Lab Results  Component Value Date/Time   HGBA1C 6.8 (H) 07/07/2020 03:04 AM   HGBA1C 6.8 (H) 03/27/2020 07:34 AM   MICROALBUR 0.4 07/08/2010 08:22 AM   MICROALBUR SUPPR mg/dL 05/07/2009 08:41 AM   Lab Results  Component Value Date   GFR 62.42 07/18/2020   GFR 71.97 03/27/2020   GFR 74.62 12/13/2019   GFR 69.64 08/01/2019   GFR 72.13 04/19/2019   Checking BG: Daily. Recent FBG readings: 141 this morning, typically 120-140.  Denies any type of GI side effects. Patient is currently at goal on the following medications:  [name] Kitchen Metformin 1000 mg twice daily  Plan  Continue current medications.  Hyperlipidemia   LDL goal < 70  Lipid Panel     Component Value Date/Time   CHOL 105 03/27/2020 0734   TRIG 74 07/07/2020 0006   HDL 29.10 (L) 03/27/2020 0734   LDLCALC 41 03/27/2020 0734    Hepatic Function Latest Ref Rng & Units 07/18/2020 07/08/2020 07/07/2020  Total Protein 6.0 - 8.3 g/dL 6.5 6.7 6.4(L)  Albumin 3.5 - 5.2 g/dL 3.5 2.4(L) 2.5(L)  AST 0 - 37 U/L 38(H) 151(H) 78(H)  ALT 0 - 53 U/L 41 135(H) 89(H)  Alk Phosphatase 39 - 117 U/L 59 58 55  Total Bilirubin 0.2 - 1.2 mg/dL 0.9 1.3(H) 1.5(H)  Bilirubin, Direct 0.0 - 0.3 mg/dL 0.3 - 0.6(H)    The  ASCVD Risk score Mikey Bussing DC Jr., et al., 2013) failed to calculate for the following reasons:   The 2013 ASCVD risk score is only valid for ages 52 to 72   Patient has failed these meds in past:  Secondary prevention. No AP due to warfarin. Patient is currently at goal the following medications:  . Atorvastatin 40 mg once daily at  night  Reviewed side effects - none noted at present.  Plan  Continue current medications.  AFIB   Pulse Readings from Last 3 Encounters:  07/18/20 83  07/11/20 74  07/10/20 67   Patient is currently rate controlled on the following medications:  [name] Kitchen Metoprolol tartrate 100 mg twice daily.   INR goal 2.0-3.0 Lab Results  Component Value Date   INR 2.3 08/20/2020   INR 2.7 (H) 07/08/2020   INR 3.8 (H) 07/07/2020   Annual Stroke Risk 7.2%/yr. Sees Dr Marlou Porch once a year. Denies any abnormal bruising, bleeding from nose or gums or blood in urine or stool. Patient is currently controlled on the following medications:  . Warfarin 2.5 mg once daily  Reviewed diet and discussed importance of consistent Vitamin K intake.  Plan  Continue current medications.  Hypothyroidism   Lab Results  Component Value Date/Time   TSH 2.42 03/27/2020 07:34 AM   TSH 1.12 08/01/2019 09:34 AM   TSH is currently within range on the following medications:  . Euthyrox 75 mcg  We discussed levothyroxine administration first thing in AM 30-60 min before eating/other meds.  Plan  Continue current medications.  Vaccines   Immunization History  Administered Date(s) Administered  . Fluad Quad(high Dose 65+) 07/14/2019, 07/18/2020  . Influenza Split 08/06/2015  . Influenza Whole 08/14/2007, 07/23/2009, 07/10/2010, 08/11/2016  . Influenza, High Dose Seasonal PF 07/30/2016  . Influenza,inj,Quad PF,6+ Mos 08/14/2013, 08/13/2014, 08/10/2018  . Influenza,inj,quad, With Preservative 11/11/2017  . Influenza-Unspecified 08/06/2015, 08/04/2017  . PFIZER SARS-COV-2  Vaccination 12/04/2019, 12/25/2019  . Pneumococcal Conjugate-13 01/16/2015  . Pneumococcal Polysaccharide-23 12/22/2009, 01/26/2017  . Pneumococcal-Unspecified 10/11/2016  . Tdap 08/28/2014  . Zoster 08/20/2010  . Zoster Recombinat (Shingrix) 10/31/2018, 04/06/2019   covid + 06/2020 - cvoid pneumonia.  Reviewed and discussed patient's vaccination history.    Plan  Recommended patient receive covid vaccine booster 2022..   Medication Management / Care Coordination   Receives prescription medications from:  [facility], [address] [place]. [place] [address] Phone: [phone] Fax: [facility], [address] [place] [address] Phone: [phone] Fax: [phone]   Denies any issues with current medication management.   Plan  Continue current medication management strategy. ___________________________ SDOH (Social Determinants of Health) assessments performed: Yes.  Future Appointments  Date Time Provider [place]  09/25/2020  9:00 AM Hayden Pedro, MD [facility] None  10/15/2020  9:00 AM [facility]  05/27/2021  1:15 PM Gardiner Barefoot, DPM [facility]  08/17/2021  9:00 AM [facility]   Visit follow-up:  . CPA follow-up: 5 month DM/BP, schedule RPH visit next 1-3 months. Do not schedule same month as PCP. [name] Kitchen RPH follow-up: 6-8 month follow-up visit.  Madelin Rear, Pharm.D., BCGP Clinical Pharmacist [facility] [phone]

## 2020-09-11 NOTE — Patient Instructions (Addendum)
Mr. Prehn,  It was nice talking with you today, thank you for reviewing your medications with me!   I have included a handout with lists of nearby fitness centers participating in silver sneakers as discussed.   Please review care plan below and call me at [phone] (direct line) with any questions!  Thank you, Ellin Mayhew, Pharm.D., BCGP Clinical Pharmacist [facility] [phone]   Goals Addressed            This Visit's Progress   . PharmD Care Plan       CARE PLAN ENTRY (see longitudinal plan of care for additional care plan information)  Current Barriers:  . Chronic Disease Management support, education, and care coordination needs related to Hypertension, Hyperlipidemia, and Diabetes   Hypertension BP Readings from Last 3 Encounters:  07/18/20 121/86  07/11/20 (!) 154/91  07/10/20 (!) 147/78   . Pharmacist Clinical Goal(s): o Over the next 365 days, patient will work with PharmD and providers to maintain BP goal <140/90 . Current regimen:  o Enalapril 5 mg once daily o Metoprolol tartrate 100 mg twice daily (heart rate control in atrial fibrillation, some blood pressure lowering) . Interventions: o Reviewed diet/exercise - Maintain a healthy weight and exercise regularly, as directed by your health care provider. Eat healthy foods, such as: Lean proteins, complex carbohydrates, fresh fruits and vegetables, low-fat dairy products, healthy fats. . Patient self care activities - Over the next 365 days, patient will: o Check BP at least once every 1-2 weeks, document, and provide at future appointments o Ensure daily salt intake < 2300 mg/day  Hyperlipidemia Lab Results  Component Value Date/Time   LDLCALC 41 03/27/2020 07:34 AM   . Pharmacist Clinical Goal(s): o Over the next 365 days, patient will work with PharmD and providers to maintain LDL goal < 70 . Current regimen:  o Atorvastatin 40 mg once daily . Interventions: o Reviewed  side effects - none noted at present! . Patient self care activities - Over the next 365 days, patient will: o Continue current management  Diabetes Lab Results  Component Value Date/Time   HGBA1C 6.8 (H) 07/07/2020 03:04 AM   HGBA1C 6.8 (H) 03/27/2020 07:34 AM   . Pharmacist Clinical Goal(s): o Over the next 365 days, patient will work with PharmD and providers to maintain A1c goal <7% . Current regimen:  o Metformin 1000 mg twice daily with meals. . Interventions: o Reviewed diet - Maintain a healthy weight and exercise regularly, as directed by your health care provider. Eat healthy foods, such as: Lean proteins, complex carbohydrates, fresh fruits and vegetables, low-fat dairy products, healthy fats. . Patient self care activities - Over the next 365 days, patient will: o Check blood sugar daily, document, and provide at future appointments o Contact provider with any episodes of hypoglycemia  Medication management . Pharmacist Clinical Goal(s): o Over the next 365 days, patient will work with PharmD and providers to maintain optimal medication adherence . Current pharmacy: [facility] . Interventions o Comprehensive medication review performed. o Continue current medication management strategy . Patient self care activities - Over the next 365 days, patient will: o Take medications as prescribed o Report any questions or concerns to PharmD and/or provider(s) Initial goal documentation.      Hypertension, Adult High blood pressure (hypertension) is when the force of blood pumping through the arteries is too strong. The arteries are the blood vessels that carry blood from the heart throughout the body. Hypertension  forces the heart to work harder to pump blood and may cause arteries to become narrow or stiff. Untreated or uncontrolled hypertension can cause a heart attack, heart failure, a stroke, kidney disease, and other problems. A blood pressure reading consists of a higher  number over a lower number. Ideally, your blood pressure should be below 120/80. The first ("top") number is called the systolic pressure. It is a measure of the pressure in your arteries as your heart beats. The second ("bottom") number is called the diastolic pressure. It is a measure of the pressure in your arteries as the heart relaxes. What are the causes? The exact cause of this condition is not known. There are some conditions that result in or are related to high blood pressure. What increases the risk? Some risk factors for high blood pressure are under your control. The following factors may make you more likely to develop this condition:  Smoking.  Having type 2 diabetes mellitus, high cholesterol, or both.  Not getting enough exercise or physical activity.  Being overweight.  Having too much fat, sugar, calories, or salt (sodium) in your diet.  Drinking too much alcohol. Some risk factors for high blood pressure may be difficult or impossible to change. Some of these factors include:  Having chronic kidney disease.  Having a family history of high blood pressure.  Age. Risk increases with age.  Race. You may be at higher risk if you are African American.  Gender. Men are at higher risk than women before age 48. After age 45, women are at higher risk than men.  Having obstructive sleep apnea.  Stress. What are the signs or symptoms? High blood pressure may not cause symptoms. Very high blood pressure (hypertensive crisis) may cause:  Headache.  Anxiety.  Shortness of breath.  Nosebleed.  Nausea and vomiting.  Vision changes.  Severe chest pain.  Seizures. How is this diagnosed? This condition is diagnosed by measuring your blood pressure while you are seated, with your arm resting on a flat surface, your legs uncrossed, and your feet flat on the floor. The cuff of the blood pressure monitor will be placed directly against the skin of your upper arm at the  level of your heart. It should be measured at least twice using the same arm. Certain conditions can cause a difference in blood pressure between your right and left arms. Certain factors can cause blood pressure readings to be lower or higher than normal for a short period of time:  When your blood pressure is higher when you are in a health care provider's office than when you are at home, this is called white coat hypertension. Most people with this condition do not need medicines.  When your blood pressure is higher at home than when you are in a health care provider's office, this is called masked hypertension. Most people with this condition may need medicines to control blood pressure. If you have a high blood pressure reading during one visit or you have normal blood pressure with other risk factors, you may be asked to:  Return on a different day to have your blood pressure checked again.  Monitor your blood pressure at home for 1 week or longer. If you are diagnosed with hypertension, you may have other blood or imaging tests to help your health care provider understand your overall risk for other conditions. How is this treated? This condition is treated by making healthy lifestyle changes, such as eating healthy foods, exercising  more, and reducing your alcohol intake. Your health care provider may prescribe medicine if lifestyle changes are not enough to get your blood pressure under control, and if:  Your systolic blood pressure is above 130.  Your diastolic blood pressure is above 80. Your personal target blood pressure may vary depending on your medical conditions, your age, and other factors. Follow these instructions at home: Eating and drinking   Eat a diet that is high in fiber and potassium, and low in sodium, added sugar, and fat. An example eating plan is called the DASH (Dietary Approaches to Stop Hypertension) diet. To eat this way: ? Eat plenty of fresh fruits and  vegetables. Try to fill one half of your plate at each meal with fruits and vegetables. ? Eat whole grains, such as whole-wheat pasta, brown rice, or whole-grain bread. Fill about one fourth of your plate with whole grains. ? Eat or drink low-fat dairy products, such as skim milk or low-fat yogurt. ? Avoid fatty cuts of meat, processed or cured meats, and poultry with skin. Fill about one fourth of your plate with lean proteins, such as fish, chicken without skin, beans, eggs, or tofu. ? Avoid pre-made and processed foods. These tend to be higher in sodium, added sugar, and fat.  Reduce your daily sodium intake. Most people with hypertension should eat less than 1,500 mg of sodium a day.  Do not drink alcohol if: ? Your health care provider tells you not to drink. ? You are pregnant, may be pregnant, or are planning to become pregnant.  If you drink alcohol: ? Limit how much you use to:  0-1 drink a day for women.  0-2 drinks a day for men. ? Be aware of how much alcohol is in your drink. In the [place] one drink equals one 12 oz bottle of beer (355 mL), one 5 oz glass of wine (148 mL), or one 1 oz glass of hard liquor (44 mL). Lifestyle   Work with your health care provider to maintain a healthy body weight or to lose weight. Ask what an ideal weight is for you.  Get at least 30 minutes of exercise most days of the week. Activities may include walking, swimming, or biking.  Include exercise to strengthen your muscles (resistance exercise), such as Pilates or lifting weights, as part of your weekly exercise routine. Try to do these types of exercises for 30 minutes at least 3 days a week.  Do not use any products that contain nicotine or tobacco, such as cigarettes, e-cigarettes, and chewing tobacco. If you need help quitting, ask your health care provider.  Monitor your blood pressure at home as told by your health care provider.  Keep all follow-up visits as told by your health care  provider. This is important. Medicines  Take over-the-counter and prescription medicines only as told by your health care provider. Follow directions carefully. Blood pressure medicines must be taken as prescribed.  Do not skip doses of blood pressure medicine. Doing this puts you at risk for problems and can make the medicine less effective.  Ask your health care provider about side effects or reactions to medicines that you should watch for. Contact a health care provider if you:  Think you are having a reaction to a medicine you are taking.  Have headaches that keep coming back (recurring).  Feel dizzy.  Have swelling in your ankles.  Have trouble with your vision. Get help right away if you:  Develop a  severe headache or confusion.  Have unusual weakness or numbness.  Feel faint.  Have severe pain in your chest or abdomen.  Vomit repeatedly.  Have trouble breathing. Summary  Hypertension is when the force of blood pumping through your arteries is too strong. If this condition is not controlled, it may put you at risk for serious complications.  Your personal target blood pressure may vary depending on your medical conditions, your age, and other factors. For most people, a normal blood pressure is less than 120/80.  Hypertension is treated with lifestyle changes, medicines, or a combination of both. Lifestyle changes include losing weight, eating a healthy, low-sodium diet, exercising more, and limiting alcohol. This information is not intended to replace advice given to you by your health care provider. Make sure you discuss any questions you have with your health care provider. Document Revised: 06/07/2018 Document Reviewed: 06/07/2018 Elsevier Patient Education  2020 [employer].

## 2020-09-25 ENCOUNTER — Other Ambulatory Visit: Payer: Self-pay | Admitting: Cardiology

## 2020-09-25 ENCOUNTER — Other Ambulatory Visit: Payer: Self-pay

## 2020-09-25 ENCOUNTER — Encounter (INDEPENDENT_AMBULATORY_CARE_PROVIDER_SITE_OTHER): Payer: Medicare HMO | Admitting: Ophthalmology

## 2020-09-25 DIAGNOSIS — H43812 Vitreous degeneration, left eye: Secondary | ICD-10-CM | POA: Diagnosis not present

## 2020-09-25 DIAGNOSIS — H353221 Exudative age-related macular degeneration, left eye, with active choroidal neovascularization: Secondary | ICD-10-CM

## 2020-09-25 DIAGNOSIS — I10 Essential (primary) hypertension: Secondary | ICD-10-CM | POA: Diagnosis not present

## 2020-09-25 DIAGNOSIS — H35032 Hypertensive retinopathy, left eye: Secondary | ICD-10-CM | POA: Diagnosis not present

## 2020-09-27 ENCOUNTER — Other Ambulatory Visit: Payer: Self-pay | Admitting: Family Medicine

## 2020-09-29 DIAGNOSIS — R69 Illness, unspecified: Secondary | ICD-10-CM | POA: Diagnosis not present

## 2020-10-15 ENCOUNTER — Other Ambulatory Visit: Payer: Self-pay

## 2020-10-15 ENCOUNTER — Ambulatory Visit: Payer: Medicare HMO | Admitting: *Deleted

## 2020-10-15 DIAGNOSIS — I4891 Unspecified atrial fibrillation: Secondary | ICD-10-CM | POA: Diagnosis not present

## 2020-10-15 DIAGNOSIS — Z5181 Encounter for therapeutic drug level monitoring: Secondary | ICD-10-CM | POA: Diagnosis not present

## 2020-10-15 LAB — POCT INR: INR: 2.5 (ref 2.0–3.0)

## 2020-10-15 NOTE — Patient Instructions (Signed)
Description   Continue taking 1 tablet daily except for 1.5 tablets on Wednesdays. Repeat INR in 8 weeks.  Call us with any medication changes or concerns # [phone] [facility] Main # [phone].     

## 2020-10-16 DIAGNOSIS — H16212 Exposure keratoconjunctivitis, left eye: Secondary | ICD-10-CM | POA: Diagnosis not present

## 2020-10-16 DIAGNOSIS — H02035 Senile entropion of left lower eyelid: Secondary | ICD-10-CM | POA: Diagnosis not present

## 2020-10-16 DIAGNOSIS — H409 Unspecified glaucoma: Secondary | ICD-10-CM | POA: Insufficient documentation

## 2020-10-20 ENCOUNTER — Encounter: Payer: Self-pay | Admitting: Family Medicine

## 2020-10-23 ENCOUNTER — Encounter (INDEPENDENT_AMBULATORY_CARE_PROVIDER_SITE_OTHER): Payer: Medicare HMO | Admitting: Ophthalmology

## 2020-10-24 ENCOUNTER — Telehealth: Payer: Self-pay | Admitting: Cardiology

## 2020-10-24 NOTE — Telephone Encounter (Signed)
Did not need this encounter °

## 2020-10-30 ENCOUNTER — Other Ambulatory Visit: Payer: Self-pay

## 2020-10-30 ENCOUNTER — Encounter (INDEPENDENT_AMBULATORY_CARE_PROVIDER_SITE_OTHER): Payer: Medicare HMO | Admitting: Ophthalmology

## 2020-10-30 DIAGNOSIS — H43813 Vitreous degeneration, bilateral: Secondary | ICD-10-CM

## 2020-10-30 DIAGNOSIS — H35033 Hypertensive retinopathy, bilateral: Secondary | ICD-10-CM

## 2020-10-30 DIAGNOSIS — H353112 Nonexudative age-related macular degeneration, right eye, intermediate dry stage: Secondary | ICD-10-CM

## 2020-10-30 DIAGNOSIS — H353221 Exudative age-related macular degeneration, left eye, with active choroidal neovascularization: Secondary | ICD-10-CM

## 2020-10-30 DIAGNOSIS — I10 Essential (primary) hypertension: Secondary | ICD-10-CM

## 2020-11-03 ENCOUNTER — Other Ambulatory Visit: Payer: Self-pay | Admitting: *Deleted

## 2020-11-03 MED ORDER — WARFARIN SODIUM 2.5 MG PO TABS: ORAL_TABLET | ORAL | 0 refills | Status: DC

## 2020-11-04 ENCOUNTER — Telehealth: Payer: Self-pay | Admitting: *Deleted

## 2020-11-04 DIAGNOSIS — I10 Essential (primary) hypertension: Secondary | ICD-10-CM | POA: Diagnosis not present

## 2020-11-04 DIAGNOSIS — H547 Unspecified visual loss: Secondary | ICD-10-CM | POA: Diagnosis not present

## 2020-11-04 DIAGNOSIS — I4891 Unspecified atrial fibrillation: Secondary | ICD-10-CM | POA: Diagnosis not present

## 2020-11-04 DIAGNOSIS — Z008 Encounter for other general examination: Secondary | ICD-10-CM | POA: Diagnosis not present

## 2020-11-04 DIAGNOSIS — D6869 Other thrombophilia: Secondary | ICD-10-CM | POA: Diagnosis not present

## 2020-11-04 DIAGNOSIS — E785 Hyperlipidemia, unspecified: Secondary | ICD-10-CM | POA: Diagnosis not present

## 2020-11-04 DIAGNOSIS — E1159 Type 2 diabetes mellitus with other circulatory complications: Secondary | ICD-10-CM | POA: Diagnosis not present

## 2020-11-04 DIAGNOSIS — I2510 Atherosclerotic heart disease of native coronary artery without angina pectoris: Secondary | ICD-10-CM | POA: Diagnosis not present

## 2020-11-04 DIAGNOSIS — E039 Hypothyroidism, unspecified: Secondary | ICD-10-CM | POA: Diagnosis not present

## 2020-11-04 DIAGNOSIS — H409 Unspecified glaucoma: Secondary | ICD-10-CM | POA: Diagnosis not present

## 2020-11-04 NOTE — Telephone Encounter (Signed)
Primary Cardiologist:Mark Marlou Porch, MD  Chart reviewed as part of pre-operative protocol coverage. Because of Marvin Foster's past medical history and time since last visit, he/she will require a follow-up visit in order to better assess preoperative cardiovascular risk.  Pre-op covering staff: - Please schedule appointment and call patient to inform them. - Please contact requesting surgeon's office via preferred method (i.e, phone, fax) to inform them of need for appointment prior to surgery.  If applicable, this message will also be routed to pharmacy pool and/or primary cardiologist for input on holding anticoagulant/antiplatelet agent as requested below so that this information is available at time of patient's appointment.   Deberah Pelton, NP  11/04/2020, 11:10 AM

## 2020-11-04 NOTE — Telephone Encounter (Signed)
Tried to call pt LMVM-need appt for pre-op clearance

## 2020-11-04 NOTE — Telephone Encounter (Signed)
Pt has appt 11/13/20 with Melina Copa, PAC. Pre op clearance to be addressed at that time. Will forward clearance notes to [facility] for upcoming appt.

## 2020-11-04 NOTE — Telephone Encounter (Signed)
Patient with diagnosis of atrial fibrillation on warfarin for anticoagulation.  Procedure: ENTROPION REPAIR ON THE LEFT LOWER EYELID, TARSORRHAPHY LEFT   Date of procedure: 11/19/20  CHA2DS2-VASc Score = 5  This indicates a 7.2% annual risk of stroke. The patient's score is based upon: CHF History: No HTN History: Yes Diabetes History: Yes Stroke History: No Vascular Disease History: Yes Age Score: 2 Gender Score: 0   CrCl 72 ml/min Platelet count 241K  Per office protocol, patient can hold warfarin for 5 days prior to procedure.   Patient will not need bridging with Lovenox (enoxaparin) around procedure.  If not bridging, patient should restart warfarin on the evening of procedure or day after, at discretion of procedure MD.

## 2020-11-04 NOTE — Telephone Encounter (Signed)
   [facility] Pre-operative Risk Assessment    HEARTCARE STAFF: - Please ensure there is not already an duplicate clearance open for this procedure. - Under Visit Info/Reason for Call, type in Other and utilize the format Clearance MM/DD/YY or Clearance TBD. Do not use dashes or single digits. - If request is for dental extraction, please clarify the # of teeth to be extracted.  Request for surgical clearance:  1. What type of surgery is being performed? ENTROPION REPAIR ON THE LEFT LOWER EYELID, TARSORRHAPHY LEFT    2. When is this surgery scheduled? 11/19/20   3. What type of clearance is required (medical clearance vs. Pharmacy clearance to hold med vs. Both)? BOTH  4. Are there any medications that need to be held prior to surgery and how long? ASA and COUMADIN   5. Practice name and name of physician performing surgery? [facility]; DR. Letitia Caul [place]   6. What is the office phone number? [phone]   7.   What is the office fax number? [phone]  8.   Anesthesia type (None, local, MAC, general) ? MAC   Julaine Hua 11/04/2020, 8:20 AM  _________________________________________________________________   (provider comments below)

## 2020-11-05 ENCOUNTER — Other Ambulatory Visit: Payer: Self-pay | Admitting: Family Medicine

## 2020-11-06 ENCOUNTER — Telehealth: Payer: Self-pay | Admitting: Cardiology

## 2020-11-06 NOTE — Telephone Encounter (Signed)
I returned call to the pt in regards to his question about holding coumadin x 14 days prior to surgery. Pt states Dr. Sabino Dick stated he will need to hold coumadin x 14 days prior.  I advised the pt of the recommendations from our PharmD: Per office protocol, patient can hold warfarin for 5 days prior to procedure.   Patient will not need bridging with Lovenox (enoxaparin) around procedure.  If not bridging, patient should restart warfarin on the evening of procedure or day after, at discretion of procedure MD.  I assured the pt that I will send notes to requesting office in regards to our conversation today. I assured the pt that I will note if requesting office is in agreement with the recommendations given by our PharmD for them to please reach out to him (the pt) and correct how many days to hold his coumadin prior to surgery. I assured the pt if Dr. Beacher May office has any further questions they will call our office. I confirmed 11/13/20 appt with Melina Copa, PAC in our office for official clearance. Pt is agreeable to plan of care and thanked me for the call and the help.

## 2020-11-06 NOTE — Telephone Encounter (Signed)
Patient was calling to check with Dr. Marlou Porch to see if it was ok to get off his coumadin for 14 days. Patient is having a procedure done. Please call back

## 2020-11-10 ENCOUNTER — Encounter: Payer: Self-pay | Admitting: Physician Assistant

## 2020-11-10 NOTE — Progress Notes (Addendum)
Cardiology Office Note    Date:  11/13/2020   ID:  Jonn, Chaikin 03-Jun-1934, MRN [id number]  PCP:  [name], MD  Cardiologist:  Candee Furbish, MD  Electrophysiologist:  None   Chief Complaint: pre-op clearance, also due for yearly follow-up  History of Present Illness:   Marvin Foster is a 87 y.o. male with history of permanent atrial fibrillation, RBBB, CAD s/p prox LAD bare metal sent 2006, DM, HTN, arthritis, diverticulosis, neuropathy, spinal stenosis who presents for preop clearance. Last cath as outlined below with reported single vessel CAD s/p PCI. We have the interventional cath note but not the diagnostic report. LVEF is not known at this time. He is pending eyelid surgery under MAC. He is not on ASA due to concomitant Coumadin per Dr. Kingsley Plan discussion with interventional team.  He returns for follow-up today feeling well from a cardiac standpoint without any new complaints. He does not formally exercise but does achieve over 4 METS in every day life including volunteering at [facility] a few times a week which includes being on his feet quite a bit. No CP, SOB, palpitations, syncope or bleeding. He denies any issues on Coumadin but has not explored options with DOAC per his report in the past. He has lower extremity edema on exam which he states is chronic and unchanged for many years.   Labwork independently reviewed: 07/2020 Mg 1.3, Hgb 12.2., LFTs ok except AST 38, K 4.8, Cr 1.08 03/2020 Trig 174, LDL 41, TSH wnl, A1C 6.8   Past Medical History:  Diagnosis Date  . Arthritis 12/2005   left hand  . Arthritis    Left knee  . Blood transfusion without reported diagnosis   . Bursitis of knee    right knee  . CAD (coronary artery disease)   . Diabetes mellitus   . Diverticulosis   . Glaucoma    left eye  . Hyperlipidemia   . Hypertension   . Neuropathy    with pain  . Permanent atrial fibrillation ([place])   . RBBB   . Spinal stenosis 01/20/05   . Tubular adenoma of colon     Past Surgical History:  Procedure Laterality Date  . BACK SURGERY    . CATARACT EXTRACTION  08/11/09   right  . corneal endothelial Left 2013  . CORNEAL TRANSPLANT Left    eye  . CORONARY ANGIOPLASTY WITH STENT PLACEMENT    . EYE SURGERY    . SMALL INTESTINE SURGERY    . SPINE SURGERY    . VASECTOMY      Current Medications: Current Meds  Medication Sig  . atorvastatin (LIPITOR) 40 MG tablet TAKE 1 TABLET BY MOUTH AT BEDTIME  . Calcium Carbonate-Vitamin D (CALCIUM 600 + D PO) Take 1 tablet by mouth 2 (two) times daily.  . diclofenac Sodium (VOLTAREN) 1 % GEL Apply 2 g topically 4 (four) times daily.  . enalapril (VASOTEC) 5 MG tablet Take 1 tablet (5 mg total) by mouth daily. Please make yearly appt with Dr. Marlou Porch for February 2022 for future refills. Thank you 1st attempt  . EUTHYROX 75 MCG tablet Take 1 tablet by mouth once daily  . fluorometholone (FML) 0.1 % ophthalmic suspension Place 1 drop into both eyes every 30 (thirty) days.   . Lancets (ONETOUCH ULTRASOFT) lancets Use as instructed  . latanoprost (XALATAN) 0.005 % ophthalmic solution Place 1 drop into both eyes at bedtime.   . magnesium 30 MG tablet  Take 250 mg by mouth in the morning.   . metFORMIN (GLUCOPHAGE) 1000 MG tablet Take 1 tablet by mouth twice daily  . metoprolol tartrate (LOPRESSOR) 100 MG tablet Take 1 tablet by mouth twice daily  . Multiple Vitamin (MULTIVITAMIN) capsule Take 1 capsule by mouth daily.  . Omega-3 Fatty Acids (FISH OIL) 1200 MG CAPS Take 3 capsules by mouth daily.  [name] VERIO test strip USE 1 STRIP TO CHECK GLUCOSE TWICE DAILY AS DIRECTED  . timolol (TIMOPTIC) 0.5 % ophthalmic solution Place 1 drop into both eyes daily.   [name] Kitchen warfarin (COUMADIN) 2.5 MG tablet Take 1 to 1 and 1/2 tablets by mouth daily as directed by the coumadin clinic.      Allergies:   Neomycin-bacitracin zn-polymyx, Penicillins, and Other   Social History   Socioeconomic  History  . Marital status: Divorced    Spouse name: Not on file  . Number of children: Not on file  . Years of education: Not on file  . Highest education level: Not on file  Occupational History  . Occupation: retired  Tobacco Use  . Smoking status: Never Smoker  . Smokeless tobacco: Never Used  Vaping Use  . Vaping Use: Never used  Substance and Sexual Activity  . Alcohol use: Yes    Alcohol/week: 0.0 standard drinks    Comment: Very seldom---socially  . Drug use: No  . Sexual activity: Not Currently    Partners: Female  Other Topics Concern  . Not on file  Social History Narrative   Volunteers at [employer] Determinants of Health   Financial Resource Strain: Low Risk   . Difficulty of Paying Living Expenses: Not hard at all  Food Insecurity: No Food Insecurity  . Worried About Charity fundraiser in the Last Year: Never true  . Ran Out of Food in the Last Year: Never true  Transportation Needs: No Transportation Needs  . Lack of Transportation (Medical): No  . Lack of Transportation (Non-Medical): No  Physical Activity: Inactive  . Days of Exercise per Week: 0 days  . Minutes of Exercise per Session: 0 min  Stress: No Stress Concern Present  . Feeling of Stress : Not at all  Social Connections: Moderately Isolated  . Frequency of Communication with Friends and Family: More than three times a week  . Frequency of Social Gatherings with Friends and Family: Once a week  . Attends Religious Services: Never  . Active Member of Clubs or Organizations: Yes  . Attends Archivist Meetings: 1 to 4 times per year  . Marital Status: Divorced     Family History:  The patient's family history includes Heart failure in his father and mother. There is no history of Stomach cancer, Colon cancer, Pancreatic cancer, or Throat cancer.  ROS:   Please see the history of present illness.  All other systems are reviewed and otherwise negative.     EKGs/Labs/Other Studies Reviewed:    Studies reviewed are outlined and summarized above. Reports included below if pertinent.  Cath 02/2005 CARDIAC CATHETERIZATION   PROCEDURE PERFORMED:  Percutaneous coronary intervention/stent implantation  to mid left anterior descending.   INDICATIONS:  Marvin Foster is a 87 year old man with known ASCVD,  nonobstructive, who recently underwent a myocardial perfusion study for  preoperative risk assessment. It was significant for the presence of  coronary ischemia. Diagnostic coronary angiography has revealed a 75-80% mid-  LAD stenosis which has  progressed since the late 1990s from 40-50%. He is  brought to catheterization laboratory at this time to allow for percutaneous  revascularization.   PROCEDURAL NOTE:  The patient is brought to cardiac catheterization  laboratory in fasting state. The right groin was prepped and draped in usual  sterile fashion. Local anesthesia was obtained with infiltration of 1%  lidocaine. A 6-French catheter sheath was inserted percutaneously into the  right femoral artery utilizing an anterior approach over a guiding J-wire.  The patient then received a 0.75 mg/kg bolus of bivalirudin followed by 1.75  mg/kg per hour constant infusion. The resultant ACT was 349 seconds. A 6-  French 3.5 CLS guiding catheter was advanced through the descending aorta  where the left coronary os was engaged. A 0.014 inch Luge intracoronary  guidewire was passed across the lesion of the proximal to mid LAD without  difficulty. The lesion was primarily stented using a 3.5/12 mm Guidant  Vision intracoronary stent. This device was carefully placed into position  and inflated to a peak pressure of 12 atmospheres for approximately one  minute. The stent balloon was deflated removed and exchanged for a 3.5/8 mm  Sci-Med Quantum Maverick intracoronary balloon. This was carefully placed  within the stented segment and inflated to  peak pressure of 16 atmospheres  for approximately one-half minute. Following these maneuvers, the guidewire  was removed. Adequate patency in orthogonal views was confirmed by repeat  angiography. The guiding catheter was then removed. A right femoral  arteriogram in the 45 degrees RAO angulation was performed via the catheter  sheath by hand injection. It documented adequate anatomy for placement of  the percutaneous closure device Angio-Seal. This was successfully deployed  with good hemostasis and an intact distal pulse. The patient was then  transported to recovery area in stable condition.   ANGIOGRAPHY:  As mentioned, the lesion treated was in the anatomic  midportion of the LAD, although it was only a centimeter or so from its  ostium. There was a moderate size diagonal branch arising proximal to the  lesion. Following balloon dilatation and stent implantation, there is no  visible residual stenosis.   FINAL IMPRESSION:  1.  Atherosclerotic cardiovascular disease, single vessel.  2.  Status post successful PCI/bare metal stent implantation in proximal      LAD.  3.  Typical angina was reproduced with device insertion and balloon      inflation.   PLAN:  The patient will need lifelong aspirin. Plavix will be administered  for one month. After this period of time, aspirin can temporarily be  discontinued to allow for the patient's surgical procedure.      EKG:  EKG is ordered today, personally reviewed, demonstrating atrial fib 64bpm, RBBB, no acute STT changes  Recent Labs: 03/27/2020: TSH 2.42 07/18/2020: ALT 41; BUN 20; Creatinine, Ser 1.08; Hemoglobin 12.2; Magnesium 1.3; Platelets 241.0; Potassium 4.8; Sodium 139  Recent Lipid Panel    Component Value Date/Time   CHOL 105 03/27/2020 0734   TRIG 74 07/07/2020 0006   HDL 29.10 (L) 03/27/2020 0734   CHOLHDL 4 03/27/2020 0734   VLDL 34.8 03/27/2020 0734   LDLCALC 41 03/27/2020 0734    PHYSICAL EXAM:    VS:   BP 128/62   Pulse 64   Ht _0  (1.753 m)   Wt 228 lb (103.4 kg)   SpO2 99%   BMI 33.67 kg/m   BMI: Body mass index is 33.67 kg/m. c/w obesity  GEN: Well  nourished, well developed WM, in no acute distress HEENT: normocephalic, atraumatic Neck: no JVD, carotid bruits, or masses Cardiac: irregularly irregular, rate controlled; no murmurs, rubs, or gallops, mild BLE ankle edema superimposed on large baseline leg habitus Respiratory:  clear to auscultation bilaterally, normal work of breathing GI: soft, nontender, nondistended, + BS MS: no deformity or atrophy Skin: warm and dry, no rash Neuro:  Alert and Oriented x 3, Strength and sensation are intact, follows commands Psych: euthymic mood, full affect  Wt Readings from Last 3 Encounters:  11/13/20 228 lb (103.4 kg)  08/11/20 220 lb (99.8 kg)  07/18/20 220 lb 4 oz (99.9 kg)     ASSESSMENT & PLAN:   1. Preop cardiovascular evaluation - doing well from cardiac standpoint without any unstable symptoms. Able to achieve > 4 METs without angina. Therefore, based on ACC/AHA guidelines, Marvin Foster would be at acceptable risk for the planned procedure without further cardiovascular testing. Regarding coumadin, the patient is already aware of the following recommendation: "Per office protocol, patient can hold warfarin for 5 days prior to procedure. Patient will not need bridging with Lovenox (enoxaparin) around procedure. If not bridging, patient should restart warfarin on the evening of procedure or day after, at discretion of procedure MD." (There was some question of holding potentially 14 days prior to surgery but this would be an unnecessary excessive amount of time and would follow the recommendation as outlined above.) I will plan to obtain an echo as outlined below but do not feel this needs to be completed prior to surgery. Of note his initial BP by tech was 158/90 on arrival but recheck was 124/64 on the right and 128/62 on the left.  Parameters given for checking BP at home periodically and calling if running >130/80. Will fax this note to requesting surgeon via [facility] fax function upon signing.  2. CAD - doing well without recent angina. Not on ASA due to concomitant wafarin. Continues on BB, statin at this time with last LDL optimally controlled in June (primary care has been obtaining routine labs).  3. Permanent atrial fib on Coumadin, known RBBB - asymptomatic. Continue with present rate control strategy. We did discuss alternative agents to Coumadin. He tends to be well controlled from an INR standpoint so at this point it's left up to the patient preference of whether he'd like to do away with INR checks. We do need to obtain a baseline echocardiogram, however, as I do not see one in the system, to ensure he does not have valvular afib. I do not suspect this to be the case based on exam. This will also help establish a baseline for his LEE which he states is chronic. Discussed elevation, low sodium, and notification for any escalating symptoms. When we get his echo back we'll touch base with him on whether he'd like to switch. I would not make any medication swaps acutely around the time of surgery - continue plan as outlined above otherwise. We'll recheck CBC, BMET today to ensure stable to change to Eliquis if he decides he wants to pursue that route.  4. Hypomagnesemia - noted in 07/2020, takes OTC Mag supplement. Rare ETOH. Recheck with labs today.  Disposition: F/u with Dr. Marlou Porch in 1 year, sooner if needed.  Medication Adjustments/Labs and Tests Ordered: Current medicines are reviewed at length with the patient today.  Concerns regarding medicines are outlined above. Medication changes, Labs and Tests ordered today are summarized above and listed in the Patient Instructions  accessible in Encounters.   Signed, Charlie Pitter, PA-C  11/13/2020 10:48 AM    [facility] [place], [place], [place]   [address] Phone: [phone]; Fax: [phone]

## 2020-11-13 ENCOUNTER — Encounter: Payer: Self-pay | Admitting: Physician Assistant

## 2020-11-13 ENCOUNTER — Other Ambulatory Visit: Payer: Self-pay

## 2020-11-13 ENCOUNTER — Ambulatory Visit: Payer: Medicare HMO | Admitting: Physician Assistant

## 2020-11-13 VITALS — BP 128/62 | HR 64 | Ht 69.0 in | Wt 228.0 lb

## 2020-11-13 DIAGNOSIS — I2510 Atherosclerotic heart disease of native coronary artery without angina pectoris: Secondary | ICD-10-CM | POA: Diagnosis not present

## 2020-11-13 DIAGNOSIS — Z01810 Encounter for preprocedural cardiovascular examination: Secondary | ICD-10-CM

## 2020-11-13 DIAGNOSIS — I4821 Permanent atrial fibrillation: Secondary | ICD-10-CM

## 2020-11-13 LAB — MAGNESIUM: Magnesium: 1.4 mg/dL — ABNORMAL LOW (ref 1.6–2.3)

## 2020-11-13 LAB — CBC
Hematocrit: 38.8 % (ref 37.5–51.0)
Hemoglobin: 12.7 g/dL — ABNORMAL LOW (ref 13.0–17.7)
MCH: 30.5 pg (ref 26.6–33.0)
MCHC: 32.7 g/dL (ref 31.5–35.7)
MCV: 93 fL (ref 79–97)
Platelets: 139 10*3/uL — ABNORMAL LOW (ref 150–450)
RBC: 4.17 x10E6/uL (ref 4.14–5.80)
RDW: 13.1 % (ref 11.6–15.4)
WBC: 7.5 10*3/uL (ref 3.4–10.8)

## 2020-11-13 LAB — BASIC METABOLIC PANEL
BUN/Creatinine Ratio: 13 (ref 10–24)
BUN: 14 mg/dL (ref 8–27)
CO2: 24 mmol/L (ref 20–29)
Calcium: 9 mg/dL (ref 8.6–10.2)
Chloride: 105 mmol/L (ref 96–106)
Creatinine, Ser: 1.04 mg/dL (ref 0.76–1.27)
GFR calc Af Amer: 76 mL/min/{1.73_m2} (ref >59)
GFR calc non Af Amer: 66 mL/min/{1.73_m2} (ref >59)
Glucose: 138 mg/dL — ABNORMAL HIGH (ref 65–99)
Potassium: 5.6 mmol/L — ABNORMAL HIGH (ref 3.5–5.2)
Sodium: 141 mmol/L (ref 134–144)

## 2020-11-13 NOTE — Patient Instructions (Signed)
Medication Instructions:  Your physician recommends that you continue on your current medications as directed. Please refer to the Current Medication list given to you today.  *If you need a refill on your cardiac medications before your next appointment, please call your pharmacy*   Lab Work: TODAY:  BMET, CBC, & MAG   If you have labs (blood work) drawn today and your tests are completely normal, you will receive your results only by: [name] Kitchen MyChart Message (if you have MyChart) OR . A paper copy in the mail If you have any lab test that is abnormal or we need to change your treatment, we will call you to review the results.   Testing/Procedures: Your physician has requested that you have an echocardiogram. Echocardiography is a painless test that uses sound waves to create images of your heart. It provides your doctor with information about the size and shape of your heart and how well your heart's chambers and valves are working. This procedure takes approximately one hour. There are no restrictions for this procedure.     Follow-Up: At [facility], you and your health needs are our priority.  As part of our continuing mission to provide you with exceptional heart care, we have created designated Provider Care Teams.  These Care Teams include your primary Cardiologist (physician) and Advanced Practice Providers (APPs -  Physician Assistants and Nurse Practitioners) who all work together to provide you with the care you need, when you need it.  We recommend signing up for the patient portal called "MyChart".  Sign up information is provided on this After Visit Summary.  MyChart is used to connect with patients for Virtual Visits (Telemedicine).  Patients are able to view lab/test results, encounter notes, upcoming appointments, etc.  Non-urgent messages can be sent to your provider as well.   To learn more about what you can do with MyChart, go to [email or web address].    Your next  appointment:   12 month(s)  The format for your next appointment:   In Person  Provider:   You may see Candee Furbish, MD or one of the following Advanced Practice Providers on your designated Care Team:    Kathyrn Drown, NP    Other Instructions  Echocardiogram An echocardiogram is a test that uses sound waves (ultrasound) to produce images of the heart. Images from an echocardiogram can provide important information about:  Heart size and shape.  The size and thickness and movement of your heart's walls.  Heart muscle function and strength.  Heart valve function or if you have stenosis. Stenosis is when the heart valves are too narrow.  If blood is flowing backward through the heart valves (regurgitation).  A tumor or infectious growth around the heart valves.  Areas of heart muscle that are not working well because of poor blood flow or injury from a heart attack.  Aneurysm detection. An aneurysm is a weak or damaged part of an artery wall. The wall bulges out from the normal force of blood pumping through the body. Tell a health care provider about:  Any allergies you have.  All medicines you are taking, including vitamins, herbs, eye drops, creams, and over-the-counter medicines.  Any blood disorders you have.  Any surgeries you have had.  Any medical conditions you have.  Whether you are pregnant or may be pregnant. What are the risks? Generally, this is a safe test. However, problems may occur, including an allergic reaction to dye (contrast) that may be used during  the test. What happens before the test? No specific preparation is needed. You may eat and drink normally. What happens during the test?  You will take off your clothes from the waist up and put on a hospital gown.  Electrodes or electrocardiogram (ECG)patches may be placed on your chest. The electrodes or patches are then connected to a device that monitors your heart rate and rhythm.  You  will lie down on a table for an ultrasound exam. A gel will be applied to your chest to help sound waves pass through your skin.  A handheld device, called a transducer, will be pressed against your chest and moved over your heart. The transducer produces sound waves that travel to your heart and bounce back (or "echo" back) to the transducer. These sound waves will be captured in real-time and changed into images of your heart that can be viewed on a video monitor. The images will be recorded on a computer and reviewed by your health care provider.  You may be asked to change positions or hold your breath for a short time. This makes it easier to get different views or better views of your heart.  In some cases, you may receive contrast through an IV in one of your veins. This can improve the quality of the pictures from your heart. The procedure may vary among health care providers and hospitals.   What can I expect after the test? You may return to your normal, everyday life, including diet, activities, and medicines, unless your health care provider tells you not to do that. Follow these instructions at home:  It is up to you to get the results of your test. Ask your health care provider, or the department that is doing the test, when your results will be ready.  Keep all follow-up visits. This is important. Summary  An echocardiogram is a test that uses sound waves (ultrasound) to produce images of the heart.  Images from an echocardiogram can provide important information about the size and shape of your heart, heart muscle function, heart valve function, and other possible heart problems.  You do not need to do anything to prepare before this test. You may eat and drink normally.  After the echocardiogram is completed, you may return to your normal, everyday life, unless your health care provider tells you not to do that. This information is not intended to replace advice given to you  by your health care provider. Make sure you discuss any questions you have with your health care provider. Document Revised: 05/20/2020 Document Reviewed: 05/20/2020 Elsevier Patient Education  2021 [employer].

## 2020-11-14 ENCOUNTER — Other Ambulatory Visit: Payer: Medicare HMO | Admitting: *Deleted

## 2020-11-14 ENCOUNTER — Telehealth: Payer: Self-pay | Admitting: Physician Assistant

## 2020-11-14 ENCOUNTER — Other Ambulatory Visit: Payer: Self-pay | Admitting: *Deleted

## 2020-11-14 ENCOUNTER — Telehealth: Payer: Self-pay | Admitting: *Deleted

## 2020-11-14 DIAGNOSIS — Z79899 Other long term (current) drug therapy: Secondary | ICD-10-CM

## 2020-11-14 LAB — BASIC METABOLIC PANEL
BUN/Creatinine Ratio: 17 (ref 10–24)
BUN: 16 mg/dL (ref 8–27)
CO2: 25 mmol/L (ref 20–29)
Calcium: 9.1 mg/dL (ref 8.6–10.2)
Chloride: 105 mmol/L (ref 96–106)
Creatinine, Ser: 0.95 mg/dL (ref 0.76–1.27)
GFR calc Af Amer: 85 mL/min/{1.73_m2} (ref >59)
GFR calc non Af Amer: 73 mL/min/{1.73_m2} (ref >59)
Glucose: 98 mg/dL (ref 65–99)
Potassium: 5.2 mmol/L (ref 3.5–5.2)
Sodium: 142 mmol/L (ref 134–144)

## 2020-11-14 MED ORDER — AMLODIPINE BESYLATE 2.5 MG PO TABS: 2.5000 mg | ORAL_TABLET | Freq: Every day | ORAL | 3 refills | Status: DC

## 2020-11-14 MED ORDER — MAGNESIUM OXIDE 400 (241.3 MG) MG PO TABS: 400.0000 mg | ORAL_TABLET | Freq: Two times a day (BID) | ORAL | 11 refills | Status: DC

## 2020-11-14 NOTE — Telephone Encounter (Signed)
-----   Message from Charlie Pitter, PA-C sent at 11/14/2020  8:16 AM EST ----- Please have patient come in this morning for stat potassium.  Hold enalapril for now and any potassium supplements he might be taking otherwise including in multivitamin. D/c OTC mag supplement and start rx Magnesium Oxide 400mg  BID today. Await recheck K for further lab plans.

## 2020-11-14 NOTE — Progress Notes (Signed)
Mag

## 2020-11-14 NOTE — Telephone Encounter (Signed)
Follow Up:     Daughter called back and would like for the nurse to pleasecall pt and give him the instructions what he needs to do about his medicine. She was called while she was at work and do not remember all that was said. She did tell the pt to come in for lab work, which he did. He needs instructions explained to him please.

## 2020-11-19 ENCOUNTER — Telehealth: Payer: Self-pay

## 2020-11-19 DIAGNOSIS — H02035 Senile entropion of left lower eyelid: Secondary | ICD-10-CM | POA: Diagnosis not present

## 2020-11-19 DIAGNOSIS — H16212 Exposure keratoconjunctivitis, left eye: Secondary | ICD-10-CM | POA: Diagnosis not present

## 2020-11-19 NOTE — Chronic Care Management (AMB) (Signed)
Chronic Care Management Pharmacy Assistant   Name: KLYDE BANKA  MRN: [id number] DOB: 07-21-1934  Reason for Encounter: Disease State/ Hypertension and Diabetes Adherence Call  Patient Questions:  1.  Have you seen any other providers since your last visit? Yes, 11/13/2020 [facility], Nedra Hai, PA-C - cardiac catheterization  2.  Any changes in your medicines or health? Patient states he recently had a catheterization and is having a surgical procedure done on his eyes today.   PCP : [name], MD  Allergies:   Allergies  Allergen Reactions  . Neomycin-Bacitracin Zn-Polymyx Rash  . Penicillins Other (See Comments) and Rash    Pt states this was diagnosed years ago, unsure as to reaction. Will not use.   . Other Rash    Medications: Outpatient Encounter Medications as of 11/19/2020  Medication Sig  . amLODipine (NORVASC) 2.5 MG tablet Take 1 tablet (2.5 mg total) by mouth daily.  [name] Kitchen atorvastatin (LIPITOR) 40 MG tablet TAKE 1 TABLET BY MOUTH AT BEDTIME  . Calcium Carbonate-Vitamin D (CALCIUM 600 + D PO) Take 1 tablet by mouth 2 (two) times daily.  . diclofenac Sodium (VOLTAREN) 1 % GEL Apply 2 g topically 4 (four) times daily.  [name] 75 MCG tablet Take 1 tablet by mouth once daily  . fluorometholone (FML) 0.1 % ophthalmic suspension Place 1 drop into both eyes every 30 (thirty) days.   . Lancets (ONETOUCH ULTRASOFT) lancets Use as instructed  . latanoprost (XALATAN) 0.005 % ophthalmic solution Place 1 drop into both eyes at bedtime.   . magnesium oxide (MAGOX 400) 400 (241.3 Mg) MG tablet Take 1 tablet (400 mg total) by mouth 2 (two) times daily.  . metFORMIN (GLUCOPHAGE) 1000 MG tablet Take 1 tablet by mouth twice daily  . metoprolol tartrate (LOPRESSOR) 100 MG tablet Take 1 tablet by mouth twice daily  . Multiple Vitamin (MULTIVITAMIN) capsule Take 1 capsule by mouth daily.  . Omega-3 Fatty Acids (FISH OIL) 1200 MG CAPS Take 3 capsules by mouth daily.   [name] VERIO test strip USE 1 STRIP TO CHECK GLUCOSE TWICE DAILY AS DIRECTED  . timolol (TIMOPTIC) 0.5 % ophthalmic solution Place 1 drop into both eyes daily.   [name] Kitchen warfarin (COUMADIN) 2.5 MG tablet Take 1 to 1 and 1/2 tablets by mouth daily as directed by the coumadin clinic.   No facility-administered encounter medications on file as of 11/19/2020.    Current Diagnosis: Patient Active Problem List   Diagnosis Date Noted  . Acute respiratory failure due to COVID-19 ([place]) 07/07/2020  . Coagulation defect ([place]) 05/28/2020  . Pain due to onychomycosis of toenail of left foot 05/28/2020  . Hypothyroid 09/21/2018  . Hyperlipidemia 08/05/2015  . Coronary artery disease due to lipid rich plaque 08/05/2015  . Hx of colonic polyps 06/12/2015  . Chronic anticoagulation 06/12/2015  . 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 Dermatitis of ear canal 05/07/2015  . Encounter for therapeutic drug monitoring 11/06/2013  . Atrial fibrillation ([place]) 07/24/2013  . Long term current use of anticoagulant therapy 07/24/2013  . Bilateral leg weakness 06/07/2013  . 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 Scrotal mass 03/07/2012  . General medical examination 02/28/2012  . ANEMIA 02/18/2010  . HEARING LOSS 12/22/2009  . ASPARTATE AMINOTRANSFERASE, SERUM, ELEVATED 11/04/2008  . LEG CRAMPS, NOCTURNAL 08/16/2008  . Diabetes mellitus type II, controlled ([place]) 05/12/2007  . Essential hypertension 03/27/2007  . CORONARY ARTERY DISEASE 03/27/2007  Reviewed chart prior to disease state call. Spoke with patient regarding BP  Recent Office Vitals: BP Readings from Last 3 Encounters:  11/13/20 128/62  07/18/20 121/86  07/11/20 (!) 154/91   Pulse Readings from Last 3 Encounters:  11/13/20 64  07/18/20 83  07/11/20 74    Wt Readings from Last 3 Encounters:  11/13/20 228 lb (103.4 kg)  08/11/20 220 lb (99.8 kg)  07/18/20 220 lb 4 oz (99.9 kg)     Kidney Function Lab  Results  Component Value Date/Time   CREATININE 0.95 11/14/2020 12:33 PM   CREATININE 1.04 11/13/2020 11:08 AM   GFR 62.42 07/18/2020 11:49 AM   GFRNONAA 73 11/14/2020 12:33 PM   GFRAA 85 11/14/2020 12:33 PM    BMP Latest Ref Rng & Units 11/14/2020 11/13/2020 07/18/2020  Glucose 65 - 99 mg/dL 98 138(H) 117(H)  BUN 8 - 27 mg/dL 16 14 20   Creatinine 0.76 - 1.27 mg/dL 0.95 1.04 1.08  BUN/Creat Ratio 10 - 24 17 13  -  Sodium 134 - 144 mmol/L 142 141 139  Potassium 3.5 - 5.2 mmol/L 5.2 5.6(H) 4.8  Chloride 96 - 106 mmol/L 105 105 102  CO2 20 - 29 mmol/L 25 24 28   Calcium 8.6 - 10.2 mg/dL 9.1 9.0 8.7    . Current antihypertensive regimen:  o Amlodipine 2.5 mg tablet daily o Metoprolol Tartrate 100 mg tablet daily  . How often are you checking your Blood Pressure? 3-5x per week   . Current home BP readings: 134/82, 150's/90's, 160's/90's. Patient states his blood pressure has been running a little high at home. Patient states he is concerned about this, he is hoping his machine is no accurate.  . What recent interventions/DTPs have been made by any provider to improve Blood Pressure and Glycemic control since last CPP Visit: Patient states he is currently taking his medications as prescribed.  . Any recent hospitalizations or ED visits since last visit with CPP? No , patient has not had any recent hospitalizations or ED visits since his last visit with Madelin Rear, CPP.  [name] Kitchen What diet changes have been made to improve Blood Pressure and Glycemic Control?  o Patient states he hasn't made any diet changes and states he doesn't follow any diet.  . What exercise is being done to improve your Blood Pressure and Glycemic Control?  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  Adherence Review: Is the patient currently on ACE/ARB medication? No Does the patient have >5 day gap between last estimated fill dates? No   Recent Relevant Labs: Lab Results   Component Value Date/Time   HGBA1C 6.8 (H) 07/07/2020 03:04 AM   HGBA1C 6.8 (H) 03/27/2020 07:34 AM   MICROALBUR 0.4 07/08/2010 08:22 AM   MICROALBUR SUPPR mg/dL 05/07/2009 08:41 AM    Kidney Function Lab Results  Component Value Date/Time   CREATININE 0.95 11/14/2020 12:33 PM   CREATININE 1.04 11/13/2020 11:08 AM   GFR 62.42 07/18/2020 11:49 AM   GFRNONAA 73 11/14/2020 12:33 PM   GFRAA 85 11/14/2020 12:33 PM    . Current antihyperglycemic regimen:  o Metformin 1000 mg tablet twice daily  . Patient denies hypoglycemic symptoms, including Pale, Sweaty, Shaky, Hungry, Nervous/irritable and Vision changes   . Patient denies hyperglycemic symptoms, including blurry vision, excessive thirst, fatigue, polyuria and weakness   . How often are you checking your blood sugar? once daily , in the morning before breakfast  .  What are your blood sugars ranging?  o Fasting: 134 o Before meals: n/a o After meals: n/a o Bedtime: n/a  . During the week, how often does your blood glucose drop below 70? Never   . Are you checking your feet daily/regularly? Patient states he does check his feet daily.  Adherence Review: Is the patient currently on a STATIN medication? Yes Is the patient currently on ACE/ARB medication? No Does the patient have >5 day gap between last estimated fill dates? No  Patient states he is having a surgical procedure on his eyes today and hand to end the call without scheduling a follow up appointment with clinical pharmacist Madelin Rear, CPP. Patient states he will call me back at a later time to schedule said appointment.  April D Calhoun, [facility] Pharmacist Assistant [phone]   Follow-Up:  Pharmacist Review

## 2020-11-21 ENCOUNTER — Other Ambulatory Visit: Payer: Self-pay

## 2020-11-21 ENCOUNTER — Other Ambulatory Visit: Payer: Medicare HMO | Admitting: *Deleted

## 2020-11-21 DIAGNOSIS — Z79899 Other long term (current) drug therapy: Secondary | ICD-10-CM

## 2020-11-22 ENCOUNTER — Other Ambulatory Visit: Payer: Self-pay | Admitting: Family Medicine

## 2020-11-22 LAB — MAGNESIUM: Magnesium: 1.6 mg/dL (ref 1.6–2.3)

## 2020-11-27 ENCOUNTER — Telehealth: Payer: Self-pay | Admitting: *Deleted

## 2020-11-27 ENCOUNTER — Encounter: Payer: Self-pay | Admitting: Family Medicine

## 2020-11-27 NOTE — Telephone Encounter (Addendum)
Since we had to move Mr. Dauphine appt pending my rescheduling, can you check on him to see how his BP is running with the switch from enalapril to amlodipine? (To recap for anyone reading, per 2/4 lab note, this patient was changed from enalapril to amlodipine due to high potassium. He has had issues with low magnesium level despite supplementation making diuretics a less ideal option.) Make sure it's an arm cuff. If majority of readings are higher than 102 systolic and his swelling is otherwise no different on amlodipine, can go up on this to 5mg  daily in the meantime. We can then check in with him at time of echo coming up soon.

## 2020-11-27 NOTE — Telephone Encounter (Signed)
  Patient Consent for Virtual Visit         Marvin Foster has provided verbal consent on 11/27/2020 for a virtual visit (video or telephone).   CONSENT FOR VIRTUAL VISIT FOR:  Marvin Foster  By participating in this virtual visit I agree to the following:  I hereby voluntarily request, consent and authorize [facility] and its employed or contracted physicians, physician assistants, nurse practitioners or other licensed health care professionals (the Practitioner), to provide me with telemedicine health care services (the "Services") as deemed necessary by the treating Practitioner. I acknowledge and consent to receive the Services by the Practitioner via telemedicine. I understand that the telemedicine visit will involve communicating with the Practitioner through live audiovisual communication technology and the disclosure of certain medical information by electronic transmission. I acknowledge that I have been given the opportunity to request an in-person assessment or other available alternative prior to the telemedicine visit and am voluntarily participating in the telemedicine visit.  I understand that I have the right to withhold or withdraw my consent to the use of telemedicine in the course of my care at any time, without affecting my right to future care or treatment, and that the Practitioner or I may terminate the telemedicine visit at any time. I understand that I have the right to inspect all information obtained and/or recorded in the course of the telemedicine visit and may receive copies of available information for a reasonable fee.  I understand that some of the potential risks of receiving the Services via telemedicine include:  [name] Kitchen Delay or interruption in medical evaluation due to technological equipment failure or disruption; . Information transmitted may not be sufficient (e.g. poor resolution of images) to allow for appropriate medical decision making by the Practitioner;  and/or  . In rare instances, security protocols could fail, causing a breach of personal health information.  Furthermore, I acknowledge that it is my responsibility to provide information about my medical history, conditions and care that is complete and accurate to the best of my ability. I acknowledge that Practitioner's advice, recommendations, and/or decision may be based on factors not within their control, such as incomplete or inaccurate data provided by me or distortions of diagnostic images or specimens that may result from electronic transmissions. I understand that the practice of medicine is not an exact science and that Practitioner makes no warranties or guarantees regarding treatment outcomes. I acknowledge that a copy of this consent can be made available to me via my patient portal ([facility]), or I can request a printed copy by calling the office of [facility].    I understand that my insurance will be billed for this visit.   I have read or had this consent read to me. . I understand the contents of this consent, which adequately explains the benefits and risks of the Services being provided via telemedicine.  . I have been provided ample opportunity to ask questions regarding this consent and the Services and have had my questions answered to my satisfaction. . I give my informed consent for the services to be provided through the use of telemedicine in my medical care

## 2020-11-27 NOTE — Telephone Encounter (Addendum)
Thx. We started him on the 2.5mg  dose of amlodipine so just wanted to clarify since note below said 5mg . He can continue the dose he is currently taking until we see back in followup (as long as it's not the enalapril!). Thank you!!

## 2020-11-27 NOTE — Progress Notes (Deleted)
{Choose 1 Note Type (Video or Telephone):[phone]}   The patient was identified using 2 identifiers.  Date:  11/27/2020   ID:  Marvin Foster, DOB 05-05-1934, MRN [id number]  {Patient Location:[phone]::"Home"} {Provider Location:[phone]::"Home Office"}  PCP:  [name], MD  Cardiologist:  Candee Furbish, MD *** Electrophysiologist:  None   Evaluation Performed:  Follow-Up Visit  Chief Complaint:  F/u HTN, edema  History of Present Illness:    Marvin Foster is a 87 y.o. male with permanent atrial fibrillation, RBBB, CAD s/p prox LAD bare metal sent 2006, DM, HTN, arthritis, diverticulosis, neuropathy, spinal stenosis who presents for preop clearance. Last cath as outlined below with reported single vessel CAD s/p PCI. We have the interventional cath note but not the diagnostic report. LVEF is not known at this time. He is pending eyelid surgery under MAC. He is not on ASA due to concomitant Coumadin per Dr. Kingsley Plan discussion with interventional team.  I recently saw him in follow-up 11/13/20 for pre-op clearance for eyelid surgery under MAC and cleared him for surgery. He was noted to have edema on exam which he stated was chronic. His initial BP was elevated but follow-up reading by me was normal. We planned a 2D echo. His labs showed hyperkalemia so ACEi was stopped and changed to amlodipine. He also appears to have chronically low magnesium level without obvious cause (rare ETOH, no PPI). He denied any issues on Coumadin but has not explored options with DOAC per his report in the past.   Echo pending See last note re coumaidn  Essential HTN CAD Permanent atrial fib with known RBBB Lower extremity edema Hypomagnesemia    Labs Independently Reviewed 11/21/20 Mg 1.6 after titration of supplement -advised to f/u pcp for this 11/14/20 K 5.2, Cr 0.95 11/13/20 Hgb 12.7, plt 139, MG 1.4, K 5.6 07/2020 Mg 1.3, Hgb 12.2., LFTs ok except AST 38, K 4.8, Cr 1.08, albumin  3.5 03/2020 Trig 174, LDL 41, TSH wnl, A1C 6.8  Past Medical History:  Diagnosis Date  . Acute respiratory failure due to COVID-19 ([place]) 07/07/2020  . ANEMIA 02/18/2010   Qualifier: Diagnosis of  By: Birdie Riddle MD, Belenda Cruise    . Arthritis 12/2005   left hand  . Arthritis    Left knee  . ASPARTATE AMINOTRANSFERASE, SERUM, ELEVATED 11/04/2008   Qualifier: Diagnosis of  By: Birdie Riddle MD, Belenda Cruise    . Atrial fibrillation ([place]) 07/24/2013  . Blood transfusion without reported diagnosis   . Bursitis of knee    right knee  . CAD (coronary artery disease)   . Chronic anticoagulation 06/12/2015  . Coagulation defect ([place]) 05/28/2020  . 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 CORONARY ARTERY DISEASE 03/27/2007   Qualifier: Diagnosis of  By: Cletus Gash MD, [place]    . Diabetes mellitus   . Diverticulosis   . Glaucoma    left eye  . Hx of colonic polyps 06/12/2015  . Hyperlipidemia   . Hypertension   . Hypothyroid 09/21/2018  . Long term current use of anticoagulant therapy 07/24/2013  . Neuropathy    with pain  . Pain due to onychomycosis of toenail of left foot 05/28/2020  . Permanent atrial fibrillation ([place])   . RBBB   . Scrotal mass 03/07/2012  . Spinal stenosis 01/20/05  . Tubular adenoma of colon    Past Surgical History:  Procedure Laterality Date  . BACK SURGERY    . CATARACT EXTRACTION  08/11/09   right  .  corneal endothelial Left 2013  . CORNEAL TRANSPLANT Left    eye  . CORONARY ANGIOPLASTY WITH STENT PLACEMENT    . EYE SURGERY    . SMALL INTESTINE SURGERY    . SPINE SURGERY    . VASECTOMY       No outpatient medications have been marked as taking for the 11/28/20 encounter (Appointment) with Charlie Pitter, PA-C.     Allergies:   Neomycin-bacitracin zn-polymyx, Penicillins, and Other   Social History   Tobacco Use  . Smoking status: Never Smoker  . Smokeless tobacco: Never Used  Vaping Use  . Vaping Use: Never used  Substance Use Topics   . Alcohol use: Yes    Alcohol/week: 0.0 standard drinks    Comment: Very seldom---socially  . Drug use: No     Family Hx: The patient's family history includes Heart failure in his father and mother. There is no history of Stomach cancer, Colon cancer, Pancreatic cancer, or Throat cancer.  ROS:   Please see the history of present illness.    *** All other systems reviewed and are negative.   Prior CV studies:   The following studies were reviewed today:  Cath 02/2005 CARDIAC CATHETERIZATION  PROCEDURE PERFORMED: Percutaneous coronary intervention/stent implantation to mid left anterior descending.  INDICATIONS: Marvin Foster is a 87 year old man with known ASCVD, nonobstructive, who recently underwent a myocardial perfusion study for preoperative risk assessment. It was significant for the presence of coronary ischemia. Diagnostic coronary angiography has revealed a 75-80% mid- LAD stenosis which has progressed since the late 1990s from 40-50%. He is brought to catheterization laboratory at this time to allow for percutaneous revascularization.  PROCEDURAL NOTE: The patient is brought to cardiac catheterization laboratory in fasting state. The right groin was prepped and draped in usual sterile fashion. Local anesthesia was obtained with infiltration of 1% lidocaine. A 6-French catheter sheath was inserted percutaneously into the right femoral artery utilizing an anterior approach over a guiding J-wire. The patient then received a 0.75 mg/kg bolus of bivalirudin followed by 1.75 mg/kg per hour constant infusion. The resultant ACT was 349 seconds. A 6- French 3.5 CLS guiding catheter was advanced through the descending aorta where the left coronary os was engaged. A 0.014 inch Luge intracoronary guidewire was passed across the lesion of the proximal to mid LAD without difficulty. The lesion was primarily stented using a 3.5/12 mm Guidant Vision  intracoronary stent. This device was carefully placed into position and inflated to a peak pressure of 12 atmospheres for approximately one minute. The stent balloon was deflated removed and exchanged for a 3.5/8 mm Sci-Med Quantum Maverick intracoronary balloon. This was carefully placed within the stented segment and inflated to peak pressure of 16 atmospheres for approximately one-half minute. Following these maneuvers, the guidewire was removed. Adequate patency in orthogonal views was confirmed by repeat angiography. The guiding catheter was then removed. A right femoral arteriogram in the 45 degrees RAO angulation was performed via the catheter sheath by hand injection. It documented adequate anatomy for placement of the percutaneous closure device Angio-Seal. This was successfully deployed with good hemostasis and an intact distal pulse. The patient was then transported to recovery area in stable condition.  ANGIOGRAPHY: As mentioned, the lesion treated was in the anatomic midportion of the LAD, although it was only a centimeter or so from its ostium. There was a moderate size diagonal branch arising proximal to the lesion. Following balloon dilatation and stent implantation, there is no visible residual  stenosis.  FINAL IMPRESSION: 1. Atherosclerotic cardiovascular disease, single vessel. 2. Status post successful PCI/bare metal stent implantation in proximal LAD. 3. Typical angina was reproduced with device insertion and balloon inflation.  PLAN: The patient will need lifelong aspirin. Plavix will be administered for one month. After this period of time, aspirin can temporarily be discontinued to allow for the patient's surgical procedure.     Labs/Other Tests and Data Reviewed:    EKG:  An ECG dated 11/13/20 was personally reviewed today and demonstrated:  atrial fib 64bpm, RBBB, no acute STT changes  Recent Labs: 03/27/2020: TSH  2.42 07/18/2020: ALT 41 11/13/2020: Hemoglobin 12.7; Platelets 139 11/14/2020: BUN 16; Creatinine, Ser 0.95; Potassium 5.2; Sodium 142 11/21/2020: Magnesium 1.6   Recent Lipid Panel Lab Results  Component Value Date/Time   CHOL 105 03/27/2020 07:34 AM   TRIG 74 07/07/2020 12:06 AM   HDL 29.10 (L) 03/27/2020 07:34 AM   CHOLHDL 4 03/27/2020 07:34 AM   LDLCALC 41 03/27/2020 07:34 AM    Wt Readings from Last 3 Encounters:  11/13/20 228 lb (103.4 kg)  08/11/20 220 lb (99.8 kg)  07/18/20 220 lb 4 oz (99.9 kg)     Objective:    Vital Signs:  There were no vitals taken for this visit.   VS reviewed. General - male in no acute distress Pulm - No labored breathing, no coughing during visit, no audible wheezing, speaking in full sentences Neuro - A+Ox3, no slurred speech, answers questions appropriately Psych - Pleasant affect  ASSESSMENT & PLAN:    1. ***   Time:   Today, I have spent *** minutes with the patient with telehealth technology discussing the above problems.     Medication Adjustments/Labs and Tests Ordered: Current medicines are reviewed at length with the patient today.  Testing and concerns regarding medicines are outlined above.    Follow Up:  {F/U [phone]} {follow up:15908}  Signed, Charlie Pitter, PA-C  11/27/2020 8:44 AM    [facility]

## 2020-11-27 NOTE — Telephone Encounter (Signed)
Pt states that his bp has been running good since the medication change.  He said only a couple days was a little higher, but for the most part, it's been in the normal.   2/5    141/91 2/6    134/75 2/7     137/78 2/8     132/83 2/11    148/91       Today: 119/78...  Said it's up and down and is seeing improvement on the 5 mg.

## 2020-11-27 NOTE — Telephone Encounter (Signed)
Perfect. Thx!

## 2020-11-27 NOTE — Telephone Encounter (Signed)
That's my error.. Pt is taking 2.5 mg of Amlodipine

## 2020-11-28 ENCOUNTER — Telehealth: Payer: Medicare HMO | Admitting: Physician Assistant

## 2020-12-03 ENCOUNTER — Other Ambulatory Visit: Payer: Self-pay | Admitting: Family Medicine

## 2020-12-05 ENCOUNTER — Other Ambulatory Visit (HOSPITAL_COMMUNITY): Payer: Medicare HMO

## 2020-12-05 ENCOUNTER — Encounter (INDEPENDENT_AMBULATORY_CARE_PROVIDER_SITE_OTHER): Payer: Medicare HMO | Admitting: Ophthalmology

## 2020-12-05 ENCOUNTER — Other Ambulatory Visit: Payer: Self-pay

## 2020-12-05 DIAGNOSIS — I10 Essential (primary) hypertension: Secondary | ICD-10-CM

## 2020-12-05 DIAGNOSIS — H35371 Puckering of macula, right eye: Secondary | ICD-10-CM

## 2020-12-05 DIAGNOSIS — H353112 Nonexudative age-related macular degeneration, right eye, intermediate dry stage: Secondary | ICD-10-CM | POA: Diagnosis not present

## 2020-12-05 DIAGNOSIS — H35033 Hypertensive retinopathy, bilateral: Secondary | ICD-10-CM

## 2020-12-05 DIAGNOSIS — H353221 Exudative age-related macular degeneration, left eye, with active choroidal neovascularization: Secondary | ICD-10-CM

## 2020-12-05 LAB — HM DIABETES EYE EXAM

## 2020-12-07 NOTE — Progress Notes (Signed)
Virtual Visit via Telephone Note   This visit type was conducted due to national recommendations for restrictions regarding the COVID-19 Pandemic (e.g. social distancing) in an effort to limit this patient's exposure and mitigate transmission in our community.  Due to his co-morbid illnesses, this patient is at least at moderate risk for complications without adequate follow up.  This format is felt to be most appropriate for this patient at this time.  The patient did not have access to video technology/had technical difficulties with video requiring transitioning to audio format only (telephone).  All issues noted in this document were discussed and addressed.  No physical exam could be performed with this format.  Please refer to the patient's chart for his  consent to telehealth for [facility].    Date:  12/10/2020   ID:  Marvin Foster, DOB 04-12-1934, MRN [id number] The patient was identified using 2 identifiers.  Patient Location: Home Provider Location: Office/Clinic   PCP:  [name], MD   Foster  Cardiologist:  Candee Furbish, MD  Evaluation Performed:  Follow-Up Visit  Chief Complaint:  BP follow up   History of Present Illness:    Marvin Foster is a 87 y.o. male with history of permanent atrial fibrillation, RBBB, CAD s/p prox LAD bare metal sent 2006, DM, HTN, arthritis, diverticulosis, neuropathy, spinal stenosis seen for follow up.   ASA stopped 11/12/19 as he is on anticoagulation by Dr. Marlou Porch after discussion with interventional cardiologist.  Seen by Melina Copa, PA on February 3 for surgical clearance of eyelid surgery.  He was cleared for surgery as he was able to achieve greater than 4 METS of activity without angina.    Echocardiogram with normal LV function and moderate aortic stenosis.  Severely enlarged RV.  Enalapril changed to Amlodpine due to hyperkalemia. On supplimental MagOx for hypomagnesemia.   Seen virtually  for follow-up.  Reviewed echocardiogram.  Patient denies chest pain, shortness of breath, orthopnea, PND, syncope, lower extremity edema or melena.  Recovering well from his eyelid surgery.  The patient does not have symptoms concerning for COVID-19 infection (fever, chills, cough, or new shortness of breath).    Past Medical History:  Diagnosis Date  . Acute respiratory failure due to COVID-19 ([place]) 07/07/2020  . ANEMIA 02/18/2010   Qualifier: Diagnosis of  By: Birdie Riddle MD, Belenda Cruise    . Arthritis 12/2005   left hand  . Arthritis    Left knee  . ASPARTATE AMINOTRANSFERASE, SERUM, ELEVATED 11/04/2008   Qualifier: Diagnosis of  By: Birdie Riddle MD, Belenda Cruise    . Atrial fibrillation ([place]) 07/24/2013  . Blood transfusion without reported diagnosis   . Bursitis of knee    right knee  . CAD (coronary artery disease)   . Chronic anticoagulation 06/12/2015  . Coagulation defect ([place]) 05/28/2020  . 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 CORONARY ARTERY DISEASE 03/27/2007   Qualifier: Diagnosis of  By: Cletus Gash MD, Lynn    . Diabetes mellitus   . Diverticulosis   . Glaucoma    left eye  . Hx of colonic polyps 06/12/2015  . Hyperlipidemia   . Hypertension   . Hypothyroid 09/21/2018  . Long term current use of anticoagulant therapy 07/24/2013  . Neuropathy    with pain  . Pain due to onychomycosis of toenail of left foot 05/28/2020  . Permanent atrial fibrillation ([place])   . RBBB   . Scrotal mass 03/07/2012  .  Spinal stenosis 01/20/05  . Tubular adenoma of colon    Past Surgical History:  Procedure Laterality Date  . BACK SURGERY    . CATARACT EXTRACTION  08/11/09   right  . corneal endothelial Left 2013  . CORNEAL TRANSPLANT Left    eye  . CORONARY ANGIOPLASTY WITH STENT PLACEMENT    . EYE SURGERY    . SMALL INTESTINE SURGERY    . SPINE SURGERY    . VASECTOMY       Current Meds  Medication Sig  . amLODipine (NORVASC) 2.5 MG tablet Take 1 tablet (2.5 mg  total) by mouth daily.  [name] Kitchen atorvastatin (LIPITOR) 40 MG tablet TAKE 1 TABLET BY MOUTH AT BEDTIME  . Calcium Carbonate-Vitamin D (CALCIUM 600 + D PO) Take 1 tablet by mouth 2 (two) times daily.  . diclofenac Sodium (VOLTAREN) 1 % GEL Apply 2 g topically 4 (four) times daily.  [name] 75 MCG tablet Take 1 tablet by mouth once daily  . fluorometholone (FML) 0.1 % ophthalmic suspension Place 1 drop into both eyes every 30 (thirty) days.   . Lancets (ONETOUCH ULTRASOFT) lancets Use as instructed  . latanoprost (XALATAN) 0.005 % ophthalmic solution Place 1 drop into both eyes at bedtime.   . magnesium oxide (MAGOX 400) 400 (241.3 Mg) MG tablet Take 1 tablet (400 mg total) by mouth 2 (two) times daily.  . metFORMIN (GLUCOPHAGE) 1000 MG tablet Take 1 tablet by mouth twice daily  . metoprolol tartrate (LOPRESSOR) 100 MG tablet Take 1 tablet by mouth twice daily  . Multiple Vitamin (MULTIVITAMIN) capsule Take 1 capsule by mouth daily.  . Omega-3 Fatty Acids (FISH OIL) 1200 MG CAPS Take 3 capsules by mouth daily.  [name] VERIO test strip USE 1 STRIP TO CHECK GLUCOSE TWICE DAILY AS DIRECTED  . timolol (TIMOPTIC) 0.5 % ophthalmic solution Place 1 drop into both eyes daily.   [name] Kitchen warfarin (COUMADIN) 2.5 MG tablet Take 1 to 1 and 1/2 tablets by mouth daily as directed by the coumadin clinic.     Allergies:   Neomycin-bacitracin zn-polymyx, Penicillins, Enalapril, Sulfa antibiotics, and Other   Social History   Tobacco Use  . Smoking status: Never Smoker  . Smokeless tobacco: Never Used  Vaping Use  . Vaping Use: Never used  Substance Use Topics  . Alcohol use: Yes    Alcohol/week: 0.0 standard drinks    Comment: Very seldom---socially  . Drug use: No     Family Hx: The patient's family history includes Heart failure in his father and mother. There is no history of Stomach cancer, Colon cancer, Pancreatic cancer, or Throat cancer.  ROS:   Please see the history of present illness.    All  other systems reviewed and are negative.   Prior CV studies:   The following studies were reviewed today: Echo 12/08/2020 1. Left ventricular ejection fraction, by estimation, is 60 to 65%. The  left ventricle has normal function. The left ventricle has no regional  wall motion abnormalities. Left ventricular diastolic function could not  be evaluated.  2. Right ventricular systolic function is normal. The right ventricular  size is severely enlarged. There is normal pulmonary artery systolic  pressure. The estimated right ventricular systolic pressure is 53.6 mmHg.  3. Right atrial size was severely dilated.  4. The mitral valve is degenerative. There is moderate calcification of  the anterior mitral valve leaflet(s) that extends into the subvalvular MV  apparatus. Mild mitral annular calcification. Trivial mitral  valve  regurgitation. No evidence of mitral  stenosis.  5. The aortic valve is calcified. There is severe calcifcation of the  aortic valve. There is severe thickening of the aortic valve. Aortic valve  regurgitation is not visualized. Moderate aortic valve stenosis. Aortic  valve area, by VTI measures 1.60  cm. Aortic valve mean gradient measures 28.0 mmHg. Aortic valve Vmax  measures 3.32 m/s.  6. Aortic dilatation noted. There is mild dilatation of the aortic root,  measuring 38 mm. There is mild dilatation of the ascending aorta,  measuring 38 mm.  7. The inferior vena cava is dilated in size with >50% respiratory  variability, suggesting right atrial pressure of 8 mmHg.   Labs/Other Tests and Data Reviewed:    EKG:  No ECG reviewed.  Recent Labs: 03/27/2020: TSH 2.42 07/18/2020: ALT 41 11/13/2020: Hemoglobin 12.7; Platelets 139 11/14/2020: BUN 16; Creatinine, Ser 0.95; Potassium 5.2; Sodium 142 11/21/2020: Magnesium 1.6   Recent Lipid Panel Lab Results  Component Value Date/Time   CHOL 105 03/27/2020 07:34 AM   TRIG 74 07/07/2020 12:06 AM   HDL 29.10 (L)  03/27/2020 07:34 AM   CHOLHDL 4 03/27/2020 07:34 AM   LDLCALC 41 03/27/2020 07:34 AM    Wt Readings from Last 3 Encounters:  12/10/20 228 lb (103.4 kg)  11/13/20 228 lb (103.4 kg)  08/11/20 220 lb (99.8 kg)     Risk Assessment/Calculations:    CHA2DS2-VASc Score = 5  This indicates a 7.2% annual risk of stroke. The patient's score is based upon: CHF History: No HTN History: Yes Diabetes History: Yes Stroke History: No Vascular Disease History: Yes Age Score: 2 Gender Score: 0  Objective:    Vital Signs:  BP 135/79   Pulse 70   Ht 5\' 9"  (1.753 m)   Wt 228 lb (103.4 kg)   BMI 33.67 kg/m    VITAL SIGNS:  reviewed GEN:  no acute distress PSYCH:  normal affect  ASSESSMENT & PLAN:    1. Persistent atrial fibrillation Rate controlled.  No bleeding issue. He will check Eliquis cost with insurance and pharmacy  And then let us know if wishes to change.   2.  Hypertension -Recently is changed to amlodipine secondary to hyperkalemia -Blood pressure stable on current dose on amlodipine  3.  Hyperkalemia -Resolved with discontinuation of enalapril  4.  Diabetes mellitus -Per PCP  5.  Aortic stenosis -Echo with severe calcification of aortic valve and moderate aortic stenosis with mean gradient of 28 mmHg. -Follow with routine echocardiogram   6. CAD - No angina. NO ASA due to need of anticoagulation.   7. HLD - 03/27/2020: Cholesterol 105; HDL 29.10; LDL Cholesterol 41; VLDL 34.8 07/07/2020: Triglycerides 74  - Continue statin        COVID-19 Education: The signs and symptoms of COVID-19 were discussed with the patient and how to seek care for testing (follow up with PCP or arrange E-visit).  The importance of social distancing was discussed today.  Time:   Today, I have spent 20 minutes with the patient with telehealth technology discussing the above problems.     Medication Adjustments/Labs and Tests Ordered: Current medicines are reviewed at length with  the patient today.  Concerns regarding medicines are outlined above.   Tests Ordered: No orders of the defined types were placed in this encounter.   Medication Changes: No orders of the defined types were placed in this encounter.   Follow Up:  Virtual Visit  in 1  year(s)  Signed, Leanor Kail, PA  12/10/2020 9:02 AM    [facility]

## 2020-12-08 ENCOUNTER — Other Ambulatory Visit: Payer: Self-pay

## 2020-12-08 ENCOUNTER — Ambulatory Visit (HOSPITAL_COMMUNITY): Payer: Medicare HMO | Attending: Cardiology

## 2020-12-08 DIAGNOSIS — I2510 Atherosclerotic heart disease of native coronary artery without angina pectoris: Secondary | ICD-10-CM | POA: Diagnosis not present

## 2020-12-08 DIAGNOSIS — Z01810 Encounter for preprocedural cardiovascular examination: Secondary | ICD-10-CM | POA: Diagnosis not present

## 2020-12-08 DIAGNOSIS — I4821 Permanent atrial fibrillation: Secondary | ICD-10-CM | POA: Diagnosis not present

## 2020-12-08 LAB — ECHOCARDIOGRAM COMPLETE
AR max vel: 1.47 cm2
AV Area VTI: 1.6 cm2
AV Area mean vel: 1.32 cm2
AV Mean grad: 28 mmHg
AV Peak grad: 44.1 mmHg
Ao pk vel: 3.32 m/s
Area-P 1/2: 2.81 cm2
MV M vel: 5.54 m/s
MV Peak grad: 122.8 mmHg
S' Lateral: 2.8 cm

## 2020-12-10 ENCOUNTER — Other Ambulatory Visit: Payer: Self-pay

## 2020-12-10 ENCOUNTER — Other Ambulatory Visit: Payer: Self-pay | Admitting: Family Medicine

## 2020-12-10 ENCOUNTER — Encounter: Payer: Self-pay | Admitting: Physician Assistant

## 2020-12-10 ENCOUNTER — Telehealth (INDEPENDENT_AMBULATORY_CARE_PROVIDER_SITE_OTHER): Payer: Medicare HMO | Admitting: Physician Assistant

## 2020-12-10 VITALS — BP 135/79 | HR 70 | Ht 69.0 in | Wt 228.0 lb

## 2020-12-10 DIAGNOSIS — I4821 Permanent atrial fibrillation: Secondary | ICD-10-CM

## 2020-12-10 DIAGNOSIS — E785 Hyperlipidemia, unspecified: Secondary | ICD-10-CM

## 2020-12-10 DIAGNOSIS — I2510 Atherosclerotic heart disease of native coronary artery without angina pectoris: Secondary | ICD-10-CM | POA: Diagnosis not present

## 2020-12-10 DIAGNOSIS — I350 Nonrheumatic aortic (valve) stenosis: Secondary | ICD-10-CM | POA: Diagnosis not present

## 2020-12-10 DIAGNOSIS — I10 Essential (primary) hypertension: Secondary | ICD-10-CM

## 2020-12-10 MED ORDER — APIXABAN 5 MG PO TABS: 5.0000 mg | ORAL_TABLET | Freq: Two times a day (BID) | ORAL | 11 refills | Status: DC

## 2020-12-10 NOTE — Addendum Note (Signed)
Addended by: Gaetano Net on: 12/10/2020 09:07 AM   Modules accepted: Orders

## 2020-12-10 NOTE — Patient Instructions (Addendum)
Medication Instructions:  Your physician has recommended you make the following change in your medication:  1.  STOP Coumadin (Warfarin) 2.  START Eliquis 5 mg taking 1 tablet twice   *If you need a refill on your cardiac medications before your next appointment, please call your pharmacy*   Lab Work: None ordered  If you have labs (blood work) drawn today and your tests are completely normal, you will receive your results only by: [name] Kitchen MyChart Message (if you have MyChart) OR . A paper copy in the mail If you have any lab test that is abnormal or we need to change your treatment, we will call you to review the results.   Testing/Procedures: None ordered   Follow-Up: At [facility], you and your health needs are our priority.  As part of our continuing mission to provide you with exceptional heart care, we have created designated Provider Care Teams.  These Care Teams include your primary Cardiologist (physician) and Advanced Practice Providers (APPs -  Physician Assistants and Nurse Practitioners) who all work together to provide you with the care you need, when you need it.  We recommend signing up for the patient portal called "MyChart".  Sign up information is provided on this After Visit Summary.  MyChart is used to connect with patients for Virtual Visits (Telemedicine).  Patients are able to view lab/test results, encounter notes, upcoming appointments, etc.  Non-urgent messages can be sent to your provider as well.   To learn more about what you can do with MyChart, go to [email or web address].    Your next appointment:   12 month(s)  The format for your next appointment:   In Person  Provider:   Candee Furbish, MD   Other Instructions

## 2020-12-15 ENCOUNTER — Ambulatory Visit (INDEPENDENT_AMBULATORY_CARE_PROVIDER_SITE_OTHER): Payer: Medicare HMO | Admitting: *Deleted

## 2020-12-15 ENCOUNTER — Other Ambulatory Visit: Payer: Self-pay

## 2020-12-15 DIAGNOSIS — Z5181 Encounter for therapeutic drug level monitoring: Secondary | ICD-10-CM

## 2020-12-15 DIAGNOSIS — I4891 Unspecified atrial fibrillation: Secondary | ICD-10-CM

## 2020-12-15 LAB — POCT INR: INR: 2.8 (ref 2.0–3.0)

## 2020-12-15 NOTE — Patient Instructions (Signed)
Description   Continue taking 1 tablet daily except for 1.5 tablets on Wednesdays. Repeat INR in 8 weeks.  Call us with any medication changes or concerns # [phone] [facility], Main # [phone].

## 2020-12-25 ENCOUNTER — Telehealth: Payer: Self-pay | Admitting: Family Medicine

## 2020-12-25 NOTE — Telephone Encounter (Signed)
I can order labs for him. Do you know what labs he will need?

## 2020-12-25 NOTE — Telephone Encounter (Signed)
Marvin Foster would like for you to put in orders and make him an appointment at [place] in [facility] for blood work to be done Monday, the 21st.  He has an appointment with Dr. Birdie Riddle on the 23rd.  Please advise.

## 2020-12-26 ENCOUNTER — Other Ambulatory Visit: Payer: Self-pay

## 2020-12-26 DIAGNOSIS — Z125 Encounter for screening for malignant neoplasm of prostate: Secondary | ICD-10-CM

## 2020-12-26 NOTE — Telephone Encounter (Signed)
Ok for the following to be associated w/ Diabetes A1C CBC w/ diff BMP LFTs TSH Lipid Panel  Medicare PSA- dx prostate cancer screen

## 2020-12-26 NOTE — Telephone Encounter (Signed)
Labs have been ordered and patient notified.

## 2020-12-29 ENCOUNTER — Other Ambulatory Visit: Payer: Medicare HMO

## 2020-12-30 ENCOUNTER — Other Ambulatory Visit: Payer: Self-pay

## 2020-12-30 ENCOUNTER — Other Ambulatory Visit (INDEPENDENT_AMBULATORY_CARE_PROVIDER_SITE_OTHER): Payer: Medicare HMO

## 2020-12-30 DIAGNOSIS — Z125 Encounter for screening for malignant neoplasm of prostate: Secondary | ICD-10-CM | POA: Diagnosis not present

## 2020-12-31 ENCOUNTER — Telehealth: Payer: Self-pay | Admitting: Family Medicine

## 2020-12-31 ENCOUNTER — Ambulatory Visit: Payer: Medicare HMO | Admitting: Family Medicine

## 2020-12-31 LAB — BASIC METABOLIC PANEL
BUN: 19 mg/dL (ref 6–23)
CO2: 27 mEq/L (ref 19–32)
Calcium: 9.2 mg/dL (ref 8.4–10.5)
Chloride: 104 mEq/L (ref 96–112)
Creatinine, Ser: 1.05 mg/dL (ref 0.40–1.50)
GFR: 64.93 mL/min (ref >60.00)
Glucose, Bld: 132 mg/dL — ABNORMAL HIGH (ref 70–99)
Potassium: 5.1 mEq/L (ref 3.5–5.1)
Sodium: 141 mEq/L (ref 135–145)

## 2020-12-31 LAB — LIPID PANEL
Cholesterol: 105 mg/dL (ref 0–200)
HDL: 28.7 mg/dL — ABNORMAL LOW (ref >39.00)
NonHDL: 76.24
Total CHOL/HDL Ratio: 4
Triglycerides: 238 mg/dL — ABNORMAL HIGH (ref 0.0–149.0)
VLDL: 47.6 mg/dL — ABNORMAL HIGH (ref 0.0–40.0)

## 2020-12-31 LAB — LDL CHOLESTEROL, DIRECT: Direct LDL: 52 mg/dL

## 2020-12-31 LAB — TSH: TSH: 2.8 u[IU]/mL (ref 0.35–4.50)

## 2020-12-31 LAB — HEMOGLOBIN A1C: Hgb A1c MFr Bld: 7 % — ABNORMAL HIGH (ref 4.6–6.5)

## 2020-12-31 NOTE — Telephone Encounter (Signed)
Patient called in reference to lab orders placed by Dr Birdie Riddle, Patient states he received an AVS yesterday in reference to dx of prostate cancer  Screening via his mychart , Patient states she would like a call back explaining the DX  Prostate Cancer screening

## 2020-12-31 NOTE — Telephone Encounter (Signed)
Responded to by Dr. Birdie Riddle via results not.

## 2020-12-31 NOTE — Telephone Encounter (Signed)
Patient is wanting to know about his lab results - Patient was told that he was being tested for prostate cancer at Lab - He does not understand this and thinks it was a mistake - no results yet on my chart - patient has appointment with you tomorrow but would like someone to get in touch with him today.  Please advise

## 2020-12-31 NOTE — Telephone Encounter (Signed)
Responded to via result note 

## 2021-01-01 ENCOUNTER — Encounter: Payer: Self-pay | Admitting: Family Medicine

## 2021-01-01 ENCOUNTER — Other Ambulatory Visit: Payer: Self-pay

## 2021-01-01 ENCOUNTER — Ambulatory Visit (INDEPENDENT_AMBULATORY_CARE_PROVIDER_SITE_OTHER): Payer: Medicare HMO | Admitting: Family Medicine

## 2021-01-01 VITALS — BP 118/70 | HR 67 | Temp 99.1°F | Resp 17 | Ht 69.0 in | Wt 229.0 lb

## 2021-01-01 DIAGNOSIS — E785 Hyperlipidemia, unspecified: Secondary | ICD-10-CM | POA: Diagnosis not present

## 2021-01-01 DIAGNOSIS — I10 Essential (primary) hypertension: Secondary | ICD-10-CM

## 2021-01-01 DIAGNOSIS — E1169 Type 2 diabetes mellitus with other specified complication: Secondary | ICD-10-CM

## 2021-01-01 DIAGNOSIS — E1139 Type 2 diabetes mellitus with other diabetic ophthalmic complication: Secondary | ICD-10-CM

## 2021-01-01 NOTE — Patient Instructions (Signed)
Follow up in 3-4 months to recheck diabetes Your labs look great!  No medication changes at this time Keep up the good work on low carb diet and physical activity as able Call with any questions or concerns Stay Safe!  Stay Healthy! Happy Spring!!!

## 2021-01-01 NOTE — Progress Notes (Signed)
   Subjective:    Patient ID: Marvin Foster, male    DOB: 23-Jun-1934, 87 y.o.   MRN: [id number]  HPI DM- chronic problem, on Metformin 1000mg  BID.  UTD on foot exam, eye exam, urine micro.  A1C 7.0.  'I feel great'.  Denies symptomatic lows.  No change in neuropathy  Hyperlipidemia- chronic problem, on Lipitor 40mg  daily.  Total cholesterol 105, HDL 28.7, LDL 52.  Trigs were elevated at 238.  No abd pain, N/V  HTN- chronic problem, on Amlodipine 2.5mg  daily, Metoprolol 100mg  BID w/ good control.  No CP, SOB, HAs.   Review of Systems For ROS see HPI   This visit occurred during the SARS-CoV-2 public health emergency.  Safety protocols were in place, including screening questions prior to the visit, additional usage of staff PPE, and extensive cleaning of exam room while observing appropriate contact time as indicated for disinfecting solutions.       Objective:   Physical Exam Vitals reviewed.  Constitutional:      General: He is not in acute distress.    Appearance: Normal appearance. He is obese. He is not ill-appearing.  HENT:     Head: Normocephalic and atraumatic.  Eyes:     Extraocular Movements: Extraocular movements intact.     Pupils: Pupils are equal, round, and reactive to light.  Cardiovascular:     Rate and Rhythm: Rhythm irregular.     Pulses: Normal pulses.     Heart sounds: Normal heart sounds.  Pulmonary:     Effort: Pulmonary effort is normal. No respiratory distress.     Breath sounds: Normal breath sounds. No wheezing or rhonchi.  Abdominal:     General: Abdomen is flat. There is no distension.     Palpations: Abdomen is soft.     Tenderness: There is no abdominal tenderness. There is no guarding.  Musculoskeletal:     Right lower leg: No edema.     Left lower leg: No edema.  Skin:    General: Skin is warm and dry.  Neurological:     General: No focal deficit present.     Mental Status: He is alert and oriented to person, place, and time.   Psychiatric:        Mood and Affect: Mood normal.        Behavior: Behavior normal.        Thought Content: Thought content normal.           Assessment & Plan:

## 2021-01-01 NOTE — Assessment & Plan Note (Signed)
Chronic problem.  Well controlled today on Amlodipine and Metoprolol.  Currently asymptomatic.  No med changes at this time.  Will follow.

## 2021-01-01 NOTE — Assessment & Plan Note (Signed)
Chronic problem.  Well controlled on Metformin 1000mg  BID.  UTD on foot exam, eye exam, urine micro.  A1C 7.0.  No changes at this time

## 2021-01-01 NOTE — Assessment & Plan Note (Signed)
Chronic problem.  Tolerating Lipitor w/o difficulty.  Recent lipid panel reviewed.  No changes needed

## 2021-01-06 DIAGNOSIS — H18513 Endothelial corneal dystrophy, bilateral: Secondary | ICD-10-CM | POA: Diagnosis not present

## 2021-01-07 ENCOUNTER — Encounter (INDEPENDENT_AMBULATORY_CARE_PROVIDER_SITE_OTHER): Payer: Medicare HMO | Admitting: Ophthalmology

## 2021-01-07 ENCOUNTER — Other Ambulatory Visit: Payer: Self-pay

## 2021-01-07 DIAGNOSIS — I10 Essential (primary) hypertension: Secondary | ICD-10-CM | POA: Diagnosis not present

## 2021-01-07 DIAGNOSIS — H353112 Nonexudative age-related macular degeneration, right eye, intermediate dry stage: Secondary | ICD-10-CM | POA: Diagnosis not present

## 2021-01-07 DIAGNOSIS — H35371 Puckering of macula, right eye: Secondary | ICD-10-CM

## 2021-01-07 DIAGNOSIS — H353221 Exudative age-related macular degeneration, left eye, with active choroidal neovascularization: Secondary | ICD-10-CM

## 2021-01-07 DIAGNOSIS — H35033 Hypertensive retinopathy, bilateral: Secondary | ICD-10-CM

## 2021-01-07 DIAGNOSIS — H43813 Vitreous degeneration, bilateral: Secondary | ICD-10-CM | POA: Diagnosis not present

## 2021-02-01 ENCOUNTER — Other Ambulatory Visit: Payer: Self-pay | Admitting: Family Medicine

## 2021-02-09 ENCOUNTER — Ambulatory Visit (INDEPENDENT_AMBULATORY_CARE_PROVIDER_SITE_OTHER): Payer: Medicare HMO | Admitting: *Deleted

## 2021-02-09 ENCOUNTER — Other Ambulatory Visit: Payer: Self-pay

## 2021-02-09 DIAGNOSIS — Z5181 Encounter for therapeutic drug level monitoring: Secondary | ICD-10-CM

## 2021-02-09 DIAGNOSIS — I4891 Unspecified atrial fibrillation: Secondary | ICD-10-CM

## 2021-02-09 LAB — POCT INR: INR: 2 (ref 2.0–3.0)

## 2021-02-09 NOTE — Patient Instructions (Addendum)
Description   *DOES NOT NEED A PRINTOUT* Today take 1.5 tablets then continue taking 1 tablet daily except for 1.5 tablets on Wednesdays. Repeat INR in 8 weeks.  Call us with any medication changes or concerns # [phone] [facility], Main # [phone].

## 2021-02-11 ENCOUNTER — Encounter (INDEPENDENT_AMBULATORY_CARE_PROVIDER_SITE_OTHER): Payer: Medicare HMO | Admitting: Ophthalmology

## 2021-02-11 ENCOUNTER — Other Ambulatory Visit: Payer: Self-pay

## 2021-02-11 DIAGNOSIS — I10 Essential (primary) hypertension: Secondary | ICD-10-CM

## 2021-02-11 DIAGNOSIS — H353221 Exudative age-related macular degeneration, left eye, with active choroidal neovascularization: Secondary | ICD-10-CM | POA: Diagnosis not present

## 2021-02-11 DIAGNOSIS — H353112 Nonexudative age-related macular degeneration, right eye, intermediate dry stage: Secondary | ICD-10-CM

## 2021-02-11 DIAGNOSIS — H35033 Hypertensive retinopathy, bilateral: Secondary | ICD-10-CM

## 2021-02-11 DIAGNOSIS — H43813 Vitreous degeneration, bilateral: Secondary | ICD-10-CM | POA: Diagnosis not present

## 2021-02-25 DIAGNOSIS — H401123 Primary open-angle glaucoma, left eye, severe stage: Secondary | ICD-10-CM | POA: Diagnosis not present

## 2021-02-25 DIAGNOSIS — H35371 Puckering of macula, right eye: Secondary | ICD-10-CM | POA: Diagnosis not present

## 2021-02-25 DIAGNOSIS — H353132 Nonexudative age-related macular degeneration, bilateral, intermediate dry stage: Secondary | ICD-10-CM | POA: Diagnosis not present

## 2021-02-25 DIAGNOSIS — H401112 Primary open-angle glaucoma, right eye, moderate stage: Secondary | ICD-10-CM | POA: Diagnosis not present

## 2021-02-27 ENCOUNTER — Other Ambulatory Visit: Payer: Self-pay | Admitting: Family Medicine

## 2021-03-13 DIAGNOSIS — M25561 Pain in right knee: Secondary | ICD-10-CM | POA: Diagnosis not present

## 2021-03-13 DIAGNOSIS — M1712 Unilateral primary osteoarthritis, left knee: Secondary | ICD-10-CM | POA: Diagnosis not present

## 2021-03-17 ENCOUNTER — Encounter: Payer: Self-pay | Admitting: Family Medicine

## 2021-03-17 MED ORDER — TAMSULOSIN HCL 0.4 MG PO CAPS: 0.4000 mg | ORAL_CAPSULE | Freq: Every day | ORAL | 3 refills | Status: DC

## 2021-03-18 ENCOUNTER — Encounter (INDEPENDENT_AMBULATORY_CARE_PROVIDER_SITE_OTHER): Payer: Medicare HMO | Admitting: Ophthalmology

## 2021-03-18 DIAGNOSIS — E109 Type 1 diabetes mellitus without complications: Secondary | ICD-10-CM | POA: Diagnosis not present

## 2021-03-18 DIAGNOSIS — I10 Essential (primary) hypertension: Secondary | ICD-10-CM | POA: Diagnosis not present

## 2021-03-18 DIAGNOSIS — E7800 Pure hypercholesterolemia, unspecified: Secondary | ICD-10-CM | POA: Diagnosis not present

## 2021-03-18 DIAGNOSIS — H40009 Preglaucoma, unspecified, unspecified eye: Secondary | ICD-10-CM | POA: Diagnosis not present

## 2021-03-18 DIAGNOSIS — Z0100 Encounter for examination of eyes and vision without abnormal findings: Secondary | ICD-10-CM | POA: Diagnosis not present

## 2021-03-19 ENCOUNTER — Encounter (INDEPENDENT_AMBULATORY_CARE_PROVIDER_SITE_OTHER): Payer: Medicare HMO | Admitting: Ophthalmology

## 2021-03-19 ENCOUNTER — Other Ambulatory Visit: Payer: Self-pay

## 2021-03-19 DIAGNOSIS — H353221 Exudative age-related macular degeneration, left eye, with active choroidal neovascularization: Secondary | ICD-10-CM | POA: Diagnosis not present

## 2021-03-19 DIAGNOSIS — H353112 Nonexudative age-related macular degeneration, right eye, intermediate dry stage: Secondary | ICD-10-CM

## 2021-03-19 DIAGNOSIS — H35033 Hypertensive retinopathy, bilateral: Secondary | ICD-10-CM | POA: Diagnosis not present

## 2021-03-19 DIAGNOSIS — I10 Essential (primary) hypertension: Secondary | ICD-10-CM | POA: Diagnosis not present

## 2021-03-19 DIAGNOSIS — H43813 Vitreous degeneration, bilateral: Secondary | ICD-10-CM

## 2021-03-27 ENCOUNTER — Other Ambulatory Visit: Payer: Self-pay | Admitting: Cardiology

## 2021-04-06 ENCOUNTER — Ambulatory Visit (INDEPENDENT_AMBULATORY_CARE_PROVIDER_SITE_OTHER): Payer: Medicare HMO | Admitting: *Deleted

## 2021-04-06 ENCOUNTER — Other Ambulatory Visit: Payer: Self-pay

## 2021-04-06 DIAGNOSIS — I4891 Unspecified atrial fibrillation: Secondary | ICD-10-CM | POA: Diagnosis not present

## 2021-04-06 DIAGNOSIS — Z5181 Encounter for therapeutic drug level monitoring: Secondary | ICD-10-CM | POA: Diagnosis not present

## 2021-04-06 LAB — POCT INR: INR: 2.8 (ref 2.0–3.0)

## 2021-04-06 NOTE — Patient Instructions (Signed)
Description   *DOES NOT NEED A PRINTOUT* Continue taking 1 tablet daily except for 1.5 tablets on Wednesdays. Repeat INR in 8 weeks.  Call us with any medication changes or concerns # [phone] [facility], Main # [phone].

## 2021-04-08 ENCOUNTER — Encounter: Payer: Self-pay | Admitting: *Deleted

## 2021-04-09 DIAGNOSIS — R059 Cough, unspecified: Secondary | ICD-10-CM | POA: Diagnosis not present

## 2021-04-09 DIAGNOSIS — Z20828 Contact with and (suspected) exposure to other viral communicable diseases: Secondary | ICD-10-CM | POA: Diagnosis not present

## 2021-04-09 DIAGNOSIS — R0981 Nasal congestion: Secondary | ICD-10-CM | POA: Diagnosis not present

## 2021-04-14 DIAGNOSIS — M1712 Unilateral primary osteoarthritis, left knee: Secondary | ICD-10-CM | POA: Diagnosis not present

## 2021-04-14 DIAGNOSIS — M25562 Pain in left knee: Secondary | ICD-10-CM | POA: Diagnosis not present

## 2021-04-19 ENCOUNTER — Encounter: Payer: Self-pay | Admitting: Family Medicine

## 2021-04-23 ENCOUNTER — Other Ambulatory Visit: Payer: Self-pay

## 2021-04-23 ENCOUNTER — Encounter (INDEPENDENT_AMBULATORY_CARE_PROVIDER_SITE_OTHER): Payer: Medicare HMO | Admitting: Ophthalmology

## 2021-04-23 DIAGNOSIS — H35033 Hypertensive retinopathy, bilateral: Secondary | ICD-10-CM

## 2021-04-23 DIAGNOSIS — I10 Essential (primary) hypertension: Secondary | ICD-10-CM

## 2021-04-23 DIAGNOSIS — H353221 Exudative age-related macular degeneration, left eye, with active choroidal neovascularization: Secondary | ICD-10-CM

## 2021-04-23 DIAGNOSIS — H43812 Vitreous degeneration, left eye: Secondary | ICD-10-CM

## 2021-04-24 ENCOUNTER — Other Ambulatory Visit: Payer: Self-pay

## 2021-04-24 ENCOUNTER — Telehealth: Payer: Self-pay | Admitting: Family Medicine

## 2021-04-24 DIAGNOSIS — E1139 Type 2 diabetes mellitus with other diabetic ophthalmic complication: Secondary | ICD-10-CM

## 2021-04-24 NOTE — Telephone Encounter (Signed)
Orders placed and patient notified.

## 2021-04-24 NOTE — Telephone Encounter (Signed)
Patient has an appointment here next Wednesday, 04/29/2021 - He would like for you to order his labs for Monday, 04/27/2021 at [facility] - He said he would like to go about 9:00 or 9:30 am.

## 2021-04-24 NOTE — Telephone Encounter (Signed)
If he wants to go prior to the appt he can schedule at the office of his choice for a BMP and A1C- dx DM

## 2021-04-27 ENCOUNTER — Other Ambulatory Visit (INDEPENDENT_AMBULATORY_CARE_PROVIDER_SITE_OTHER): Payer: Medicare HMO

## 2021-04-27 DIAGNOSIS — E1139 Type 2 diabetes mellitus with other diabetic ophthalmic complication: Secondary | ICD-10-CM

## 2021-04-27 LAB — BASIC METABOLIC PANEL
BUN: 18 mg/dL (ref 6–23)
CO2: 28 mEq/L (ref 19–32)
Calcium: 9.2 mg/dL (ref 8.4–10.5)
Chloride: 100 mEq/L (ref 96–112)
Creatinine, Ser: 1.05 mg/dL (ref 0.40–1.50)
GFR: 64.78 mL/min (ref >60.00)
Glucose, Bld: 139 mg/dL — ABNORMAL HIGH (ref 70–99)
Potassium: 4.7 mEq/L (ref 3.5–5.1)
Sodium: 138 mEq/L (ref 135–145)

## 2021-04-27 LAB — HEMOGLOBIN A1C: Hgb A1c MFr Bld: 7 % — ABNORMAL HIGH (ref 4.6–6.5)

## 2021-04-29 ENCOUNTER — Ambulatory Visit: Payer: Medicare HMO | Admitting: Family Medicine

## 2021-04-29 ENCOUNTER — Telehealth: Payer: Self-pay | Admitting: Family Medicine

## 2021-04-29 NOTE — Telephone Encounter (Signed)
Patient needs a referral to a urologist - patient has discussed this possibility previously - Please let patient know when it has been sent.  Thanks

## 2021-04-30 ENCOUNTER — Encounter: Payer: Self-pay | Admitting: Family Medicine

## 2021-04-30 ENCOUNTER — Other Ambulatory Visit: Payer: Self-pay

## 2021-04-30 DIAGNOSIS — R350 Frequency of micturition: Secondary | ICD-10-CM

## 2021-04-30 NOTE — Telephone Encounter (Signed)
Per provider referral placed for patient for urology for frequent urination. Patient called and notified.

## 2021-05-04 ENCOUNTER — Other Ambulatory Visit: Payer: Self-pay | Admitting: Family Medicine

## 2021-05-08 DIAGNOSIS — R0781 Pleurodynia: Secondary | ICD-10-CM | POA: Diagnosis not present

## 2021-05-08 DIAGNOSIS — S2020XA Contusion of thorax, unspecified, initial encounter: Secondary | ICD-10-CM | POA: Diagnosis not present

## 2021-05-11 DIAGNOSIS — M1712 Unilateral primary osteoarthritis, left knee: Secondary | ICD-10-CM | POA: Diagnosis not present

## 2021-05-19 ENCOUNTER — Telehealth: Payer: Self-pay

## 2021-05-19 NOTE — Progress Notes (Signed)
Chronic Care Management Pharmacy Assistant   Name: Marvin Foster  MRN: [id number] DOB: 1934/08/11   Reason for Encounter: Disease State - Hypertension    Recent office visits:  01/01/21 Annye Asa, MD (PCP) - Family Medicine - Hyperlipidemia - No medication changes. Follow up in 3-4 months.  Recent consult visits:  02/25/21 Lisabeth Pick, MD - Ophthalmology - Glaucoma of left eye - Latanoprost qhs OU Timolol qd ou prescribed. Follow up with Dr Zigmund Daniel as scheduled.   01/06/21 Dineen Kid, MD - Ophthalmology - Fuch's corneal dystrophy of both eyes - No notes available.   12/10/20 Leanor Kail PA-C - Cardiology - Atrial Fibrillation (Telemedicine) - Coumadin discontinued. Eliquis 5 mg 1 tablet daily started. Follow up in 12 months.    Hospital visits:  None in previous 6 months  Medications: Outpatient Encounter Medications as of 05/19/2021  Medication Sig   amLODipine (NORVASC) 2.5 MG tablet Take 1 tablet (2.5 mg total) by mouth daily.   atorvastatin (LIPITOR) 40 MG tablet TAKE 1 TABLET BY MOUTH AT BEDTIME   Calcium Carbonate-Vitamin D (CALCIUM 600 + D PO) Take 1 tablet by mouth 2 (two) times daily.   diclofenac Sodium (VOLTAREN) 1 % GEL Apply 2 g topically 4 (four) times daily.   EUTHYROX 75 MCG tablet Take 1 tablet by mouth once daily   fluorometholone (FML) 0.1 % ophthalmic suspension Place 1 drop into both eyes every 30 (thirty) days.    Lancets (ONETOUCH ULTRASOFT) lancets Use as instructed   latanoprost (XALATAN) 0.005 % ophthalmic solution Place 1 drop into both eyes at bedtime.    magnesium oxide (MAGOX 400) 400 (241.3 Mg) MG tablet Take 1 tablet (400 mg total) by mouth 2 (two) times daily.   metFORMIN (GLUCOPHAGE) 1000 MG tablet Take 1 tablet by mouth twice daily   metoprolol tartrate (LOPRESSOR) 100 MG tablet Take 1 tablet by mouth twice daily   Multiple Vitamin (MULTIVITAMIN) capsule Take 1 capsule by mouth daily.   Omega-3 Fatty Acids (FISH OIL)  1200 MG CAPS Take 3 capsules by mouth daily.   ONETOUCH VERIO test strip USE 1 STRIP TO CHECK GLUCOSE TWICE DAILY AS DIRECTED   tamsulosin (FLOMAX) 0.4 MG CAPS capsule Take 1 capsule (0.4 mg total) by mouth daily.   timolol (TIMOPTIC) 0.5 % ophthalmic solution Place 1 drop into both eyes daily.    warfarin (COUMADIN) 2.5 MG tablet TAKE 1 TO 1 & 1/2 (ONE TO ONE & ONE-HALF) TABLETS BY MOUTH ONCE DAILY AS DIRECTED BY  THE  COUMADIN  CLINIC   No facility-administered encounter medications on file as of 05/19/2021.    Current antihypertensive regimen:  Amlodipine 2.5 mg tablet daily Metoprolol Tartrate 100 mg tablet daily  How often are you checking your Blood Pressure?     Current home BP readings:    What recent interventions/DTPs have been made by any provider to improve Blood Pressure control since last CPP Visit:     Any recent hospitalizations or ED visits since last visit with CPP?  Patient has not had any hospitalizations or ED visits since last visit with CPP.   What diet changes have been made to improve Blood Pressure Control?     What exercise is being done to improve your Blood Pressure Control?      Adherence Review: Is the patient currently on ACE/ARB medication? No Does the patient have >5 day gap between last estimated fill dates? No   Amlodipine 2.5 mg tablet daily - last filled  05/04/21 90 days  Metoprolol Tartrate 100 mg tablet daily - last filled 03/02/21 90 days    Star Rating Drugs: atorvastatin (LIPITOR) 40 MG tablet - last filled 03/03/21 90 days  metFORMIN (GLUCOPHAGE) 1000 MG tablet - last filled 03/02/21 90 days    Future Appointments  Date Time Provider [place]  06/01/2021  9:30 AM [facility]  06/22/2021 11:30 AM [name], MD [facility]  08/17/2021  9:00 AM [facility]   Multiple attempts were made to contact patient. Attempt were unsuccessful .Dwana Curd  Jobe Gibbon, [place] Pharmacist Assistant  [phone]  Time Spent: 30 minutes

## 2021-05-25 DIAGNOSIS — M1712 Unilateral primary osteoarthritis, left knee: Secondary | ICD-10-CM | POA: Diagnosis not present

## 2021-05-26 ENCOUNTER — Other Ambulatory Visit: Payer: Self-pay | Admitting: Family Medicine

## 2021-05-27 ENCOUNTER — Other Ambulatory Visit: Payer: Self-pay

## 2021-05-27 ENCOUNTER — Ambulatory Visit: Payer: Medicare HMO | Admitting: Podiatry

## 2021-05-27 ENCOUNTER — Encounter: Payer: Self-pay | Admitting: Podiatry

## 2021-05-27 DIAGNOSIS — M79674 Pain in right toe(s): Secondary | ICD-10-CM

## 2021-05-27 DIAGNOSIS — E119 Type 2 diabetes mellitus without complications: Secondary | ICD-10-CM | POA: Diagnosis not present

## 2021-05-27 DIAGNOSIS — M79675 Pain in left toe(s): Secondary | ICD-10-CM | POA: Diagnosis not present

## 2021-05-27 DIAGNOSIS — B351 Tinea unguium: Secondary | ICD-10-CM | POA: Diagnosis not present

## 2021-05-27 DIAGNOSIS — D689 Coagulation defect, unspecified: Secondary | ICD-10-CM

## 2021-05-27 NOTE — Progress Notes (Signed)
This patient returns to my office for at risk foot care.  This patient requires this care by a professional since this patient will be at risk due to having coagulation defect and diabetes.   Patient is taking coumadin.  This patient is unable to cut nails himself since the patient cannot reach his nails.These nails are painful walking and wearing shoes.  He presents for his annual diabetic exam. This patient presents for at risk foot care today.  General Appearance  Alert, conversant and in no acute stress.  Vascular  Dorsalis pedis  pulses are palpable  Bilaterally. Posterior tibial pulses are weakly palpable due to swelling from venous stasis.  Capillary return is within normal limits  bilaterally. Temperature is within normal limits  Bilaterally. Severe swelling and venous stasis lower legs  B/L.  Neurologic  Senn-Weinstein monofilament wire test within normal limits  bilaterally. Muscle power within normal limits bilaterally.  Nails Thick disfigured discolored nails with subungual debris  2,3 left foot. No evidence of bacterial infection or drainage bilaterally.  Orthopedic  No limitations of motion  feet .  No crepitus or effusions noted. HAV  B/L.  Skin  normotropic skin with no porokeratosis noted bilaterally.  No signs of infections or ulcers noted.     Onychomycosis    Pain in left toes  Diabetes with vascular pathology only.  Consent was obtained for treatment procedures.   Mechanical debridement of nails 1-5  bilaterally performed with a nail nipper.  Filed with dremel without incident.  Diabetic foot exam was performed   Return office visit    1 year                 Told patient to return for periodic foot care and evaluation due to potential at risk complications.   Gardiner Barefoot DPM

## 2021-05-28 ENCOUNTER — Encounter (INDEPENDENT_AMBULATORY_CARE_PROVIDER_SITE_OTHER): Payer: Medicare HMO | Admitting: Ophthalmology

## 2021-05-28 DIAGNOSIS — H35032 Hypertensive retinopathy, left eye: Secondary | ICD-10-CM | POA: Diagnosis not present

## 2021-05-28 DIAGNOSIS — H353221 Exudative age-related macular degeneration, left eye, with active choroidal neovascularization: Secondary | ICD-10-CM | POA: Diagnosis not present

## 2021-05-28 DIAGNOSIS — H43812 Vitreous degeneration, left eye: Secondary | ICD-10-CM

## 2021-05-28 DIAGNOSIS — I10 Essential (primary) hypertension: Secondary | ICD-10-CM

## 2021-06-01 ENCOUNTER — Other Ambulatory Visit: Payer: Self-pay

## 2021-06-01 ENCOUNTER — Ambulatory Visit: Payer: Medicare HMO

## 2021-06-01 DIAGNOSIS — Z5181 Encounter for therapeutic drug level monitoring: Secondary | ICD-10-CM | POA: Diagnosis not present

## 2021-06-01 DIAGNOSIS — I4891 Unspecified atrial fibrillation: Secondary | ICD-10-CM

## 2021-06-01 DIAGNOSIS — M1712 Unilateral primary osteoarthritis, left knee: Secondary | ICD-10-CM | POA: Diagnosis not present

## 2021-06-01 LAB — POCT INR: INR: 4.1 — AB (ref 2.0–3.0)

## 2021-06-01 NOTE — Patient Instructions (Addendum)
*  DOES NOT NEED A PRINTOUT*  - skip warfarin tonight, then - have a serving of greens today - Continue taking 1 tablet daily except for 1.5 tablets on Wednesdays.  - Repeat INR in 6 weeks.   Call us with any medication changes or concerns # [phone] [facility] # [phone].

## 2021-06-10 ENCOUNTER — Other Ambulatory Visit: Payer: Self-pay

## 2021-06-10 DIAGNOSIS — E785 Hyperlipidemia, unspecified: Secondary | ICD-10-CM

## 2021-06-10 DIAGNOSIS — E1169 Type 2 diabetes mellitus with other specified complication: Secondary | ICD-10-CM

## 2021-06-10 MED ORDER — ATORVASTATIN CALCIUM 40 MG PO TABS: 40.0000 mg | ORAL_TABLET | Freq: Every day | ORAL | 0 refills | Status: DC

## 2021-06-11 DIAGNOSIS — H6122 Impacted cerumen, left ear: Secondary | ICD-10-CM | POA: Diagnosis not present

## 2021-06-18 DIAGNOSIS — R350 Frequency of micturition: Secondary | ICD-10-CM | POA: Diagnosis not present

## 2021-06-18 DIAGNOSIS — R351 Nocturia: Secondary | ICD-10-CM | POA: Diagnosis not present

## 2021-06-22 ENCOUNTER — Encounter: Payer: Self-pay | Admitting: Family Medicine

## 2021-06-22 ENCOUNTER — Ambulatory Visit (INDEPENDENT_AMBULATORY_CARE_PROVIDER_SITE_OTHER): Payer: Medicare HMO | Admitting: Family Medicine

## 2021-06-22 ENCOUNTER — Other Ambulatory Visit: Payer: Self-pay

## 2021-06-22 VITALS — BP 122/80 | HR 79 | Temp 98.6°F | Resp 17 | Ht 69.0 in | Wt 201.0 lb

## 2021-06-22 DIAGNOSIS — E1169 Type 2 diabetes mellitus with other specified complication: Secondary | ICD-10-CM | POA: Diagnosis not present

## 2021-06-22 DIAGNOSIS — E1139 Type 2 diabetes mellitus with other diabetic ophthalmic complication: Secondary | ICD-10-CM | POA: Diagnosis not present

## 2021-06-22 DIAGNOSIS — E785 Hyperlipidemia, unspecified: Secondary | ICD-10-CM | POA: Diagnosis not present

## 2021-06-22 DIAGNOSIS — Z23 Encounter for immunization: Secondary | ICD-10-CM | POA: Diagnosis not present

## 2021-06-22 DIAGNOSIS — I10 Essential (primary) hypertension: Secondary | ICD-10-CM | POA: Diagnosis not present

## 2021-06-22 LAB — BASIC METABOLIC PANEL
BUN: 16 mg/dL (ref 6–23)
CO2: 26 mEq/L (ref 19–32)
Calcium: 9.1 mg/dL (ref 8.4–10.5)
Chloride: 101 mEq/L (ref 96–112)
Creatinine, Ser: 0.96 mg/dL (ref 0.40–1.50)
GFR: 72.06 mL/min (ref >60.00)
Glucose, Bld: 118 mg/dL — ABNORMAL HIGH (ref 70–99)
Potassium: 4.7 mEq/L (ref 3.5–5.1)
Sodium: 139 mEq/L (ref 135–145)

## 2021-06-22 LAB — HEPATIC FUNCTION PANEL
ALT: 31 U/L (ref 0–53)
AST: 32 U/L (ref 0–37)
Albumin: 4.2 g/dL (ref 3.5–5.2)
Alkaline Phosphatase: 54 U/L (ref 39–117)
Bilirubin, Direct: 0.4 mg/dL — ABNORMAL HIGH (ref 0.0–0.3)
Total Bilirubin: 1.3 mg/dL — ABNORMAL HIGH (ref 0.2–1.2)
Total Protein: 6.6 g/dL (ref 6.0–8.3)

## 2021-06-22 LAB — CBC WITH DIFFERENTIAL/PLATELET
Basophils Absolute: 0.1 10*3/uL (ref 0.0–0.1)
Basophils Relative: 1.3 % (ref 0.0–3.0)
Eosinophils Absolute: 0.2 10*3/uL (ref 0.0–0.7)
Eosinophils Relative: 3.6 % (ref 0.0–5.0)
HCT: 39.3 % (ref 39.0–52.0)
Hemoglobin: 13.3 g/dL (ref 13.0–17.0)
Lymphocytes Relative: 24.3 % (ref 12.0–46.0)
Lymphs Abs: 1.3 10*3/uL (ref 0.7–4.0)
MCHC: 33.7 g/dL (ref 30.0–36.0)
MCV: 93.5 fl (ref 78.0–100.0)
Monocytes Absolute: 0.5 10*3/uL (ref 0.1–1.0)
Monocytes Relative: 9.1 % (ref 3.0–12.0)
Neutro Abs: 3.3 10*3/uL (ref 1.4–7.7)
Neutrophils Relative %: 61.7 % (ref 43.0–77.0)
Platelets: 143 10*3/uL — ABNORMAL LOW (ref 150.0–400.0)
RBC: 4.21 Mil/uL — ABNORMAL LOW (ref 4.22–5.81)
RDW: 15.1 % (ref 11.5–15.5)
WBC: 5.4 10*3/uL (ref 4.0–10.5)

## 2021-06-22 LAB — LIPID PANEL
Cholesterol: 99 mg/dL (ref 0–200)
HDL: 30.4 mg/dL — ABNORMAL LOW (ref >39.00)
LDL Cholesterol: 37 mg/dL (ref 0–99)
NonHDL: 68.77
Total CHOL/HDL Ratio: 3
Triglycerides: 157 mg/dL — ABNORMAL HIGH (ref 0.0–149.0)
VLDL: 31.4 mg/dL (ref 0.0–40.0)

## 2021-06-22 LAB — MICROALBUMIN / CREATININE URINE RATIO
Creatinine,U: 74.6 mg/dL
Microalb Creat Ratio: 7 mg/g (ref 0.0–30.0)
Microalb, Ur: 5.2 mg/dL — ABNORMAL HIGH (ref 0.0–1.9)

## 2021-06-22 LAB — TSH: TSH: 2.22 u[IU]/mL (ref 0.35–5.50)

## 2021-06-22 NOTE — Assessment & Plan Note (Signed)
Chronic problem.  On Metformin '1000mg'$  BID w/ good control.  UTD on eye exam, foot exam (done at Podiatry).  Now that he is no longer on Enalapril, needs microalbumin.  Will continue to follow.

## 2021-06-22 NOTE — Assessment & Plan Note (Signed)
Chronic problem.  Tolerating Atorvastatin w/o difficulty.  Check labs.  Adjust meds prn

## 2021-06-22 NOTE — Patient Instructions (Addendum)
Schedule your complete physical in 3-4 months We'll notify you of your lab results and make any changes if needed Keep up the good work!  You look great! Get the new COVID booster at your convenience Call with any questions or concerns Happy Fall!!

## 2021-06-22 NOTE — Assessment & Plan Note (Signed)
Chronic problem.  Well controlled today.  Currently asymptomatic.  No med changes at this time

## 2021-06-22 NOTE — Progress Notes (Signed)
   Subjective:    Patient ID: Marvin Foster, male    DOB: 07/10/1934, 87 y.o.   MRN: [id number]  HPI HTN- chronic problem, on Amlodipine 2.'5mg'$  daily, Metoprolol '100mg'$  BID w/ good control.  No CP, SOB, HAs, edema.  Hyperlipidemia- chronic problem, on Atorvastatin '40mg'$  daily.  No abd pain, N/V.  DM- chronic problem, on Metformin '1000mg'$  BID.  UTD on eye exam.  Had foot exam w/ podiatry on 05/27/21.  Was previously on Enalapril and now needs urine microalbumin.  Pt reports feeling well and that sugars have been well controlled.  A1C on 7/18- 7%Denies symptomatic lows.  + neuropathy.   Review of Systems For ROS see HPI   This visit occurred during the SARS-CoV-2 public health emergency.  Safety protocols were in place, including screening questions prior to the visit, additional usage of staff PPE, and extensive cleaning of exam room while observing appropriate contact time as indicated for disinfecting solutions.      Objective:   Physical Exam Constitutional:      General: He is not in acute distress.    Appearance: Normal appearance. He is well-developed. He is not ill-appearing.  HENT:     Head: Normocephalic and atraumatic.  Eyes:     Extraocular Movements: Extraocular movements intact.     Conjunctiva/sclera: Conjunctivae normal.     Pupils: Pupils are equal, round, and reactive to light.  Neck:     Thyroid: No thyromegaly.  Cardiovascular:     Rate and Rhythm: Normal rate and regular rhythm.     Pulses: Normal pulses.     Heart sounds: Murmur (I/VI SEM) heard.  Pulmonary:     Effort: Pulmonary effort is normal. No respiratory distress.     Breath sounds: Normal breath sounds.  Abdominal:     General: Bowel sounds are normal. There is no distension.     Palpations: Abdomen is soft.  Musculoskeletal:     Cervical back: Normal range of motion and neck supple.     Right lower leg: No edema.     Left lower leg: No edema.  Lymphadenopathy:     Cervical: No cervical  adenopathy.  Skin:    General: Skin is warm and dry.  Neurological:     General: No focal deficit present.     Mental Status: He is alert and oriented to person, place, and time.     Cranial Nerves: No cranial nerve deficit.  Psychiatric:        Mood and Affect: Mood normal.        Behavior: Behavior normal.          Assessment & Plan:

## 2021-06-23 ENCOUNTER — Telehealth: Payer: Self-pay

## 2021-06-23 NOTE — Progress Notes (Signed)
Chronic Care Management Pharmacy Assistant   Name: Marvin Foster  MRN: [id number] DOB: 1934-09-21   Reason for Encounter: Disease State - Hypertension Call     Recent office visits:  06/22/21 Annye Asa, MD (PCP) - Family Medicine - Hyperlipidemia - Flu shot administered. Labs were ordered. Follow up in 2-3 months for physical.   01/01/21 Annye Asa, MD (PCP) - Family Medicine - Hyperlipidemia - No medication changes. Follow up in 3-4 months.   Recent consult visits:  06/11/21 Melida Quitter, MD - Otolaryngology - Left ear cerumen impaction - Ear lavage performed. Mupirocin ointment prescribed for nasal vestibule irritation. Follow up not indicated.   05/27/21 Gardiner Barefoot, DPM - Podiatry - No medication changes. Follow up in 1 year.   02/25/21 Lisabeth Pick, MD - Ophthalmology - Glaucoma of left eye - Latanoprost qhs OU Timolol qd ou prescribed. Follow up with Dr Zigmund Daniel as scheduled.    01/06/21 Dineen Kid, MD - Ophthalmology - Fuch's corneal dystrophy of both eyes - No notes available.    12/10/20 Leanor Kail PA-C - Cardiology - Atrial Fibrillation (Telemedicine) - Coumadin discontinued. Eliquis 5 mg 1 tablet daily started. Follow up in 12 months.     Hospital visits:  None in previous 6 months  Medications: Outpatient Encounter Medications as of 06/23/2021  Medication Sig   amLODipine (NORVASC) 2.5 MG tablet Take 1 tablet (2.5 mg total) by mouth daily.   atorvastatin (LIPITOR) 40 MG tablet Take 1 tablet (40 mg total) by mouth at bedtime.   benzonatate (TESSALON) 100 MG capsule benzonatate 100 mg capsule  TAKE 1 CAPSULE BY MOUTH THREE TIMES DAILY AS NEEDED (Patient not taking: Reported on 06/22/2021)   Calcium Carbonate-Vitamin D (CALCIUM 600 + D PO) Take 1 tablet by mouth 2 (two) times daily.   diclofenac Sodium (VOLTAREN) 1 % GEL Apply 2 g topically 4 (four) times daily.   EUTHYROX 75 MCG tablet Take 1 tablet by mouth once daily   fluorometholone  (FML) 0.1 % ophthalmic suspension Place 1 drop into both eyes every 30 (thirty) days.    Lancets (ONETOUCH ULTRASOFT) lancets Use as instructed   latanoprost (XALATAN) 0.005 % ophthalmic solution Place 1 drop into both eyes at bedtime.    magnesium oxide (MAGOX 400) 400 (241.3 Mg) MG tablet Take 1 tablet (400 mg total) by mouth 2 (two) times daily.   metFORMIN (GLUCOPHAGE) 1000 MG tablet Take 1 tablet by mouth twice daily   metoprolol tartrate (LOPRESSOR) 100 MG tablet Take 1 tablet by mouth twice daily   Multiple Vitamin (MULTIVITAMIN) capsule Take 1 capsule by mouth daily.   Omega-3 Fatty Acids (FISH OIL) 1200 MG CAPS Take 3 capsules by mouth daily.   ONETOUCH VERIO test strip USE 1 STRIP TO CHECK GLUCOSE TWICE DAILY AS DIRECTED   tamsulosin (FLOMAX) 0.4 MG CAPS capsule Take 1 capsule (0.4 mg total) by mouth daily.   timolol (TIMOPTIC) 0.5 % ophthalmic solution Place 1 drop into both eyes daily.    warfarin (COUMADIN) 2.5 MG tablet TAKE 1 TO 1 & 1/2 (ONE TO ONE & ONE-HALF) TABLETS BY MOUTH ONCE DAILY AS DIRECTED BY  THE  COUMADIN  CLINIC   No facility-administered encounter medications on file as of 06/23/2021.   Current antihypertensive regimen:  Amlodipine 2.5 mg tablet daily Metoprolol Tartrate 100 mg tablet daily  How often are you checking your Blood Pressure?  Patient reported he is checking his blood pressure daily.    Current home BP readings: 06/24/21  147/91   What recent interventions/DTPs have been made by any provider to improve Blood Pressure control since last CPP Visit:  Patient denied any recent changes in his current medication regimen.  Any recent hospitalizations or ED visits since last visit with CPP?  Patient has not had any hospitalizations or ED visits since last visit with CPP.   What diet changes have been made to improve Blood Pressure Control?  Patient reported he does watch his salt intake in his diet.    What exercise is being done to improve your  Blood Pressure Control?  Patient reported he is active.    Adherence Review: Is the patient currently on ACE/ARB medication? No Does the patient have >5 day gap between last estimated fill dates? No  Amlodipine 2.5 mg tablet daily - last filled 05/04/21 90 days  Metoprolol Tartrate 100 mg tablet daily - last filled 05/27/21 90 days    Care Gaps  AWV: done 08/11/20 Colonoscopy: done 11/16/18 DM Eye Exam: due 12/05/21 DEXA: never Mammogram: N/A   Star Rating Drugs: MetFORMIN (GLUCOPHAGE) 1000 MG tablet - last filled 05/27/21 90 days  Atorvastatin (LIPITOR) 40 MG tablet - last filled 06/10/21 90 days    Future Appointments  Date Time Provider [place]  07/02/2021  1:30 PM Hayden Pedro, MD [facility] None  07/13/2021  9:30 AM [facility]  08/17/2021  9:00 AM [facility] [place], [place] Clinical Pharmacist Assistant  [phone]  Time Spent: 40 minutes

## 2021-07-01 ENCOUNTER — Other Ambulatory Visit: Payer: Self-pay | Admitting: Family Medicine

## 2021-07-02 ENCOUNTER — Other Ambulatory Visit: Payer: Self-pay

## 2021-07-02 ENCOUNTER — Encounter (INDEPENDENT_AMBULATORY_CARE_PROVIDER_SITE_OTHER): Payer: Medicare HMO | Admitting: Ophthalmology

## 2021-07-02 DIAGNOSIS — H35033 Hypertensive retinopathy, bilateral: Secondary | ICD-10-CM

## 2021-07-02 DIAGNOSIS — H353112 Nonexudative age-related macular degeneration, right eye, intermediate dry stage: Secondary | ICD-10-CM

## 2021-07-02 DIAGNOSIS — H353221 Exudative age-related macular degeneration, left eye, with active choroidal neovascularization: Secondary | ICD-10-CM | POA: Diagnosis not present

## 2021-07-02 DIAGNOSIS — I10 Essential (primary) hypertension: Secondary | ICD-10-CM

## 2021-07-02 DIAGNOSIS — H43813 Vitreous degeneration, bilateral: Secondary | ICD-10-CM | POA: Diagnosis not present

## 2021-07-13 ENCOUNTER — Ambulatory Visit: Payer: Medicare HMO

## 2021-07-13 ENCOUNTER — Other Ambulatory Visit: Payer: Self-pay

## 2021-07-13 DIAGNOSIS — M1712 Unilateral primary osteoarthritis, left knee: Secondary | ICD-10-CM | POA: Diagnosis not present

## 2021-07-13 DIAGNOSIS — I4891 Unspecified atrial fibrillation: Secondary | ICD-10-CM

## 2021-07-13 DIAGNOSIS — Z5181 Encounter for therapeutic drug level monitoring: Secondary | ICD-10-CM

## 2021-07-13 LAB — POCT INR: INR: 1.9 — AB (ref 2.0–3.0)

## 2021-07-13 NOTE — Patient Instructions (Signed)
Description   *DOES NOT NEED A PRINTOUT*  Take 1.5 tablets today and then Continue taking 1 tablet daily except for 1.5 tablets on Wednesdays. Repeat INR in 6 weeks.   Call us with any medication changes or concerns # [phone] [facility], Main # [phone].

## 2021-07-15 DIAGNOSIS — N3281 Overactive bladder: Secondary | ICD-10-CM | POA: Diagnosis not present

## 2021-07-15 DIAGNOSIS — R351 Nocturia: Secondary | ICD-10-CM | POA: Diagnosis not present

## 2021-07-15 DIAGNOSIS — R350 Frequency of micturition: Secondary | ICD-10-CM | POA: Diagnosis not present

## 2021-08-10 ENCOUNTER — Encounter: Payer: Self-pay | Admitting: Family Medicine

## 2021-08-13 ENCOUNTER — Encounter (INDEPENDENT_AMBULATORY_CARE_PROVIDER_SITE_OTHER): Payer: Medicare HMO | Admitting: Ophthalmology

## 2021-08-13 ENCOUNTER — Encounter: Payer: Self-pay | Admitting: *Deleted

## 2021-08-13 ENCOUNTER — Other Ambulatory Visit: Payer: Self-pay

## 2021-08-13 ENCOUNTER — Ambulatory Visit (INDEPENDENT_AMBULATORY_CARE_PROVIDER_SITE_OTHER): Payer: Medicare HMO | Admitting: *Deleted

## 2021-08-13 DIAGNOSIS — H353221 Exudative age-related macular degeneration, left eye, with active choroidal neovascularization: Secondary | ICD-10-CM | POA: Diagnosis not present

## 2021-08-13 DIAGNOSIS — H43813 Vitreous degeneration, bilateral: Secondary | ICD-10-CM | POA: Diagnosis not present

## 2021-08-13 DIAGNOSIS — I10 Essential (primary) hypertension: Secondary | ICD-10-CM

## 2021-08-13 DIAGNOSIS — Z0000 Encounter for general adult medical examination without abnormal findings: Secondary | ICD-10-CM | POA: Diagnosis not present

## 2021-08-13 DIAGNOSIS — H35033 Hypertensive retinopathy, bilateral: Secondary | ICD-10-CM

## 2021-08-13 NOTE — Patient Instructions (Signed)
[name] , Thank you for taking time to come for your Medicare Wellness Visit. I appreciate your ongoing commitment to your health goals. Please review the following plan we discussed and let me know if I can assist you in the future.   Screening recommendations/referrals: Colonoscopy: no longer required Recommended yearly ophthalmology/optometry visit for glaucoma screening and checkup Recommended yearly dental visit for hygiene and checkup  Vaccinations: Influenza vaccine: up to date Pneumococcal vaccine: up to date Tdap vaccine: up to date Shingles vaccine: up to date    Advanced directives: Education provided  Conditions/risks identified:   Next appointment:   Preventive Care 33 Years and Older, Male Preventive care refers to lifestyle choices and visits with your health care provider that can promote health and wellness. What does preventive care include? A yearly physical exam. This is also called an annual well check. Dental exams once or twice a year. Routine eye exams. Ask your health care provider how often you should have your eyes checked. Personal lifestyle choices, including: Daily care of your teeth and gums. Regular physical activity. Eating a healthy diet. Avoiding tobacco and drug use. Limiting alcohol use. Practicing safe sex. Taking low doses of aspirin every day. Taking vitamin and mineral supplements as recommended by your health care provider. What happens during an annual well check? The services and screenings done by your health care provider during your annual well check will depend on your age, overall health, lifestyle risk factors, and family history of disease. Counseling  Your health care provider may ask you questions about your: Alcohol use. Tobacco use. Drug use. Emotional well-being. Home and relationship well-being. Sexual activity. Eating habits. History of falls. Memory and ability to understand (cognition). Work and work  Statistician. Screening  You may have the following tests or measurements: Height, weight, and BMI. Blood pressure. Lipid and cholesterol levels. These may be checked every 5 years, or more frequently if you are over 69 years old. Skin check. Lung cancer screening. You may have this screening every year starting at age 31 if you have a 30-pack-year history of smoking and currently smoke or have quit within the past 15 years. Fecal occult blood test (FOBT) of the stool. You may have this test every year starting at age 43. Flexible sigmoidoscopy or colonoscopy. You may have a sigmoidoscopy every 5 years or a colonoscopy every 10 years starting at age 81. Prostate cancer screening. Recommendations will vary depending on your family history and other risks. Hepatitis C blood test. Hepatitis B blood test. Sexually transmitted disease (STD) testing. Diabetes screening. This is done by checking your blood sugar (glucose) after you have not eaten for a while (fasting). You may have this done every 1-3 years. Abdominal aortic aneurysm (AAA) screening. You may need this if you are a current or former smoker. Osteoporosis. You may be screened starting at age 3 if you are at high risk. Talk with your health care provider about your test results, treatment options, and if necessary, the need for more tests. Vaccines  Your health care provider may recommend certain vaccines, such as: Influenza vaccine. This is recommended every year. Tetanus, diphtheria, and acellular pertussis (Tdap, Td) vaccine. You may need a Td booster every 10 years. Zoster vaccine. You may need this after age 64. Pneumococcal 13-valent conjugate (PCV13) vaccine. One dose is recommended after age 31. Pneumococcal polysaccharide (PPSV23) vaccine. One dose is recommended after age 21. Talk to your health care provider about which screenings and vaccines you need  and how often you need them. This information is not intended to replace  advice given to you by your health care provider. Make sure you discuss any questions you have with your health care provider. Document Released: 10/24/2015 Document Revised: 06/16/2016 Document Reviewed: 07/29/2015 Elsevier Interactive Patient Education  2017 [place] Prevention in the Home Falls can cause injuries. They can happen to people of all ages. There are many things you can do to make your home safe and to help prevent falls. What can I do on the outside of my home? Regularly fix the edges of walkways and driveways and fix any cracks. Remove anything that might make you trip as you walk through a door, such as a raised step or threshold. Trim any bushes or trees on the path to your home. Use bright outdoor lighting. Clear any walking paths of anything that might make someone trip, such as rocks or tools. Regularly check to see if handrails are loose or broken. Make sure that both sides of any steps have handrails. Any raised decks and porches should have guardrails on the edges. Have any leaves, snow, or ice cleared regularly. Use sand or salt on walking paths during winter. Clean up any spills in your garage right away. This includes oil or grease spills. What can I do in the bathroom? Use night lights. Install grab bars by the toilet and in the tub and shower. Do not use towel bars as grab bars. Use non-skid mats or decals in the tub or shower. If you need to sit down in the shower, use a plastic, non-slip stool. Keep the floor dry. Clean up any water that spills on the floor as soon as it happens. Remove soap buildup in the tub or shower regularly. Attach bath mats securely with double-sided non-slip rug tape. Do not have throw rugs and other things on the floor that can make you trip. What can I do in the bedroom? Use night lights. Make sure that you have a light by your bed that is easy to reach. Do not use any sheets or blankets that are too big for your bed.  They should not hang down onto the floor. Have a firm chair that has side arms. You can use this for support while you get dressed. Do not have throw rugs and other things on the floor that can make you trip. What can I do in the kitchen? Clean up any spills right away. Avoid walking on wet floors. Keep items that you use a lot in easy-to-reach places. If you need to reach something above you, use a strong step stool that has a grab bar. Keep electrical cords out of the way. Do not use floor polish or wax that makes floors slippery. If you must use wax, use non-skid floor wax. Do not have throw rugs and other things on the floor that can make you trip. What can I do with my stairs? Do not leave any items on the stairs. Make sure that there are handrails on both sides of the stairs and use them. Fix handrails that are broken or loose. Make sure that handrails are as long as the stairways. Check any carpeting to make sure that it is firmly attached to the stairs. Fix any carpet that is loose or worn. Avoid having throw rugs at the top or bottom of the stairs. If you do have throw rugs, attach them to the floor with carpet tape. Make sure that you  have a light switch at the top of the stairs and the bottom of the stairs. If you do not have them, ask someone to add them for you. What else can I do to help prevent falls? Wear shoes that: Do not have high heels. Have rubber bottoms. Are comfortable and fit you well. Are closed at the toe. Do not wear sandals. If you use a stepladder: Make sure that it is fully opened. Do not climb a closed stepladder. Make sure that both sides of the stepladder are locked into place. Ask someone to hold it for you, if possible. Clearly mark and make sure that you can see: Any grab bars or handrails. First and last steps. Where the edge of each step is. Use tools that help you move around (mobility aids) if they are needed. These  include: Canes. Walkers. Scooters. Crutches. Turn on the lights when you go into a dark area. Replace any light bulbs as soon as they burn out. Set up your furniture so you have a clear path. Avoid moving your furniture around. If any of your floors are uneven, fix them. If there are any pets around you, be aware of where they are. Review your medicines with your doctor. Some medicines can make you feel dizzy. This can increase your chance of falling. Ask your doctor what other things that you can do to help prevent falls. This information is not intended to replace advice given to you by your health care provider. Make sure you discuss any questions you have with your health care provider. Document Released: 07/24/2009 Document Revised: 03/04/2016 Document Reviewed: 11/01/2014 Elsevier Interactive Patient Education  2017 [employer].

## 2021-08-13 NOTE — Progress Notes (Signed)
Subjective:   Marvin Foster is a 87 y.o. male who presents for Medicare Annual/Subsequent preventive examination.  I connected with  Marvin Foster on 08/13/21 by a telephone enabled telemedicine application and verified that I am speaking with the correct person using two identifiers.   I discussed the limitations of evaluation and management by telemedicine. The patient expressed understanding and agreed to proceed.     Review of Systems           Objective:    There were no vitals filed for this visit. There is no height or weight on file to calculate BMI.  Advanced Directives 08/11/2020 08/01/2019 07/13/2018 02/24/2018 01/26/2017 09/25/2015  Does Patient Have a Medical Advance Directive? No Yes No No No No  Type of Advance Directive - Healthcare Power of Dooms;Living will - - - -  Copy of [place] in Chart? - No - copy requested - - - -  Would patient like information on creating a medical advance directive? Yes (MAU/Ambulatory/Procedural Areas - Information given) - No - Patient declined Yes (MAU/Ambulatory/Procedural Areas - Information given) Yes (MAU/Ambulatory/Procedural Areas - Information given) No - patient declined information    Current Medications (verified) Outpatient Encounter Medications as of 08/13/2021  Medication Sig   amLODipine (NORVASC) 2.5 MG tablet Take 1 tablet (2.5 mg total) by mouth daily.   atorvastatin (LIPITOR) 40 MG tablet Take 1 tablet (40 mg total) by mouth at bedtime.   benzonatate (TESSALON) 100 MG capsule benzonatate 100 mg capsule  TAKE 1 CAPSULE BY MOUTH THREE TIMES DAILY AS NEEDED (Patient not taking: Reported on 06/22/2021)   Calcium Carbonate-Vitamin D (CALCIUM 600 + D PO) Take 1 tablet by mouth 2 (two) times daily.   diclofenac Sodium (VOLTAREN) 1 % GEL Apply 2 g topically 4 (four) times daily.   fluorometholone (FML) 0.1 % ophthalmic suspension Place 1 drop into both eyes every 30 (thirty) days.    Lancets  (ONETOUCH ULTRASOFT) lancets Use as instructed   latanoprost (XALATAN) 0.005 % ophthalmic solution Place 1 drop into both eyes at bedtime.    levothyroxine (SYNTHROID) 75 MCG tablet Take 1 tablet by mouth once daily   magnesium oxide (MAGOX 400) 400 (241.3 Mg) MG tablet Take 1 tablet (400 mg total) by mouth 2 (two) times daily.   metFORMIN (GLUCOPHAGE) 1000 MG tablet Take 1 tablet by mouth twice daily   metoprolol tartrate (LOPRESSOR) 100 MG tablet Take 1 tablet by mouth twice daily   Multiple Vitamin (MULTIVITAMIN) capsule Take 1 capsule by mouth daily.   Omega-3 Fatty Acids (FISH OIL) 1200 MG CAPS Take 3 capsules by mouth daily.   ONETOUCH VERIO test strip USE 1 STRIP TO CHECK GLUCOSE TWICE DAILY AS DIRECTED   tamsulosin (FLOMAX) 0.4 MG CAPS capsule Take 1 capsule (0.4 mg total) by mouth daily.   timolol (TIMOPTIC) 0.5 % ophthalmic solution Place 1 drop into both eyes daily.    warfarin (COUMADIN) 2.5 MG tablet TAKE 1 TO 1 & 1/2 (ONE TO ONE & ONE-HALF) TABLETS BY MOUTH ONCE DAILY AS DIRECTED BY  THE  COUMADIN  CLINIC   No facility-administered encounter medications on file as of 08/13/2021.    Allergies (verified) Neomycin-bacitracin zn-polymyx, Penicillins, Enalapril, Sulfa antibiotics, and Other   History: Past Medical History:  Diagnosis Date   Acute respiratory failure due to COVID-19 [facility]) 07/07/2020   ANEMIA 02/18/2010   Qualifier: Diagnosis of  By: Birdie Riddle MD, Belenda Cruise     Arthritis 12/2005  left hand   Arthritis    Left knee   ASPARTATE AMINOTRANSFERASE, SERUM, ELEVATED 11/04/2008   Qualifier: Diagnosis of  By: Birdie Riddle MD, [place]     Atrial fibrillation ([place]) 07/24/2013   Blood transfusion without reported diagnosis    Bursitis of knee    right knee   CAD (coronary artery disease)    Chronic anticoagulation 06/12/2015   Coagulation defect ([place]) 05/28/2020   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   CORONARY ARTERY DISEASE 03/27/2007   Qualifier:  Diagnosis of  By: Cletus Gash MD, [place]     Diabetes mellitus    Diverticulosis    Glaucoma    left eye   Hx of colonic polyps 06/12/2015   Hyperlipidemia    Hypertension    Hypothyroid 09/21/2018   Long term current use of anticoagulant therapy 07/24/2013   Neuropathy    with pain   Pain due to onychomycosis of toenail of left foot 05/28/2020   Permanent atrial fibrillation (HCC)    RBBB    Scrotal mass 03/07/2012   Spinal stenosis 01/20/05   Tubular adenoma of colon    Past Surgical History:  Procedure Laterality Date   BACK SURGERY     CATARACT EXTRACTION  08/11/09   right   corneal endothelial Left 2013   CORNEAL TRANSPLANT Left    eye   CORONARY ANGIOPLASTY WITH STENT PLACEMENT     EYE SURGERY     SMALL INTESTINE SURGERY     SPINE SURGERY     VASECTOMY     Family History  Problem Relation Age of Onset   Heart failure Mother    Heart failure Father    Stomach cancer Neg Hx    Colon cancer Neg Hx    Pancreatic cancer Neg Hx    Throat cancer Neg Hx    Social History   Socioeconomic History   Marital status: Divorced    Spouse name: Not on file   Number of children: Not on file   Years of education: Not on file   Highest education level: Not on file  Occupational History   Occupation: retired  Tobacco Use   Smoking status: Never   Smokeless tobacco: Never  Vaping Use   Vaping Use: Never used  Substance and Sexual Activity   Alcohol use: Yes    Alcohol/week: 0.0 standard drinks    Comment: Very seldom---socially   Drug use: No   Sexual activity: Not Currently    Partners: Female  Other Topics Concern   Not on file  Social History Narrative   Volunteers at [employer] Determinants of Health   Financial Resource Strain: Low Risk    Difficulty of Paying Living Expenses: Not hard at all  Food Insecurity: No Food Insecurity   Worried About Charity fundraiser in the Last Year: Never true   Arboriculturist in the Last Year: Never true   Transportation Needs: No Transportation Needs   Lack of Transportation (Medical): No   Lack of Transportation (Non-Medical): No  Physical Activity: Inactive   Days of Exercise per Week: 0 days   Minutes of Exercise per Session: 0 min  Stress: No Stress Concern Present   Feeling of Stress : Not at all  Social Connections: Moderately Isolated   Frequency of Communication with Friends and Family: More than three times a week   Frequency of Social Gatherings with Friends and  Family: Once a week   Attends Religious Services: Never   Active Member of Clubs or Organizations: Yes   Attends Archivist Meetings: 1 to 4 times per year   Marital Status: Divorced    Tobacco Counseling Counseling given: Not Answered   Clinical Intake:                 Diabetic?  Yes  Nutrition Risk Assessment:  Has the patient had any N/V/D within the last 2 months?  No  Does the patient have any non-healing wounds?  No  Has the patient had any unintentional weight loss or weight gain?  Yes   Diabetes:  Is the patient diabetic?  Yes  If diabetic, was a CBG obtained today?  No  Did the patient bring in their glucometer from home?  No  How often do you monitor your CBG's? 1 x a day.   Financial Strains and Diabetes Management:  Are you having any financial strains with the device, your supplies or your medication? No .  Does the patient want to be seen by Chronic Care Management for management of their diabetes?  No  Would the patient like to be referred to a Nutritionist or for Diabetic Management?  No   Diabetic Exams:  Diabetic Eye Exam: Completed . Overdue for diabetic eye exam. Pt has been advised about the importance in completing this exam.  Diabetic Foot Exam: Completed . Pt has been advised about the importance in completing this exam.          Activities of Daily Living In your present state of health, do you have any difficulty performing the following activities:  01/01/2021  Hearing? N  Vision? N  Difficulty concentrating or making decisions? N  Walking or climbing stairs? N  Dressing or bathing? N  Doing errands, shopping? N  Some recent data might be hidden    Patient Care Team: [name], MD as PCP - General Jerline Pain, MD as PCP - Cardiology (Cardiology) Jerline Pain, MD as Consulting Physician (Cardiology) [place], [place] (Specialist) Gardiner Barefoot, DPM as Consulting Physician (Podiatry) Lyndee Hensen, PT as Physical Therapist (Physical Therapy) Madelin Rear, [facility] as Pharmacist (Pharmacist)  Indicate any recent Medical Services you may have received from other than [facility] providers in the past year (date may be approximate).     Assessment:   This is a routine wellness examination for Marvin Foster.  Hearing/Vision screen No results found.  Dietary issues and exercise activities discussed:     Goals Addressed   None    Depression Screen PHQ 2/9 Scores 06/22/2021 01/01/2021 08/11/2020 04/03/2020 12/14/2019 12/05/2019 08/01/2019  PHQ - 2 Score 0 0 0 4 0 0 0  PHQ- 9 Score 0 0 - 4 - 0 0    Fall Risk Fall Risk  06/22/2021 01/01/2021 08/11/2020 04/03/2020 12/14/2019  Falls in the past year? 1 0 0 0 0  Comment - - - - -  Number falls in past yr: 0 0 0 0 0  Injury with Fall? 0 0 0 0 0  Risk Factor Category  - - - - -  Risk for fall due to : History of fall(s) No Fall Risks - - -  Risk for fall due to: Comment - - - - -  Follow up Falls evaluation completed - Falls prevention discussed Falls evaluation completed Falls evaluation completed    [place]:  Any stairs  in or around the home? No  If so, are there any without handrails? No  Home free of loose throw rugs in walkways, pet beds, electrical cords, etc? Yes  Adequate lighting in your home to reduce risk of falls? Yes   ASSISTIVE DEVICES UTILIZED TO PREVENT FALLS:  Life alert? No  Use of a cane, walker or  w/c? No  Grab bars in the bathroom? Yes  Shower chair or bench in shower? No  Elevated toilet seat or a handicapped toilet? No   TIMED UP AND GO:  Was the test performed? No .    Cognitive Function:  Normal cognitive status assessed by direct observation by this Nurse Health Advisor. No abnormalities found.   MMSE - Mini Mental State Exam 02/24/2018 09/25/2015  Orientation to time 5 5  Orientation to Place 5 5  Registration 3 3  Attention/ Calculation 5 5  Recall 3 2  Language- name 2 objects 2 2  Language- repeat 1 1  Language- follow 3 step command 3 3  Language- read & follow direction 1 1  Write a sentence 1 1  Copy design 1 1  Total score 30 29     6CIT Screen 08/11/2020  What Year? 0 points  What month? 0 points  What time? 0 points  Count back from 20 0 points  Months in reverse 0 points  Repeat phrase 0 points  Total Score 0    Immunizations Immunization History  Administered Date(s) Administered   Fluad Quad(high Dose 65+) 07/14/2019, 07/18/2020, 06/22/2021   Influenza Split 08/06/2015   Influenza Whole 08/14/2007, 07/23/2009, 07/10/2010, 08/11/2016   Influenza, High Dose Seasonal PF 07/30/2016   Influenza,inj,Quad PF,6+ Mos 08/14/2013, 08/13/2014, 08/10/2018   Influenza,inj,quad, With Preservative 11/11/2017   Influenza-Unspecified 08/06/2015, 08/04/2017   PFIZER(Purple Top)SARS-COV-2 Vaccination 12/04/2019, 12/25/2019, 11/24/2020   Pneumococcal Conjugate-13 01/16/2015   Pneumococcal Polysaccharide-23 12/22/2009, 01/26/2017   Pneumococcal-Unspecified 10/11/2016   Tdap 08/28/2014   Zoster Recombinat (Shingrix) 10/31/2018, 04/06/2019   Zoster, Live 08/20/2010    TDAP status: Up to date  Flu Vaccine status: Up to date  Pneumococcal vaccine status: Up to date  Covid-19 vaccine status: Information provided on how to obtain vaccines.   Qualifies for Shingles Vaccine? No   Zostavax completed No   Shingrix Completed?: No.    Education has been  provided regarding the importance of this vaccine. Patient has been advised to call insurance company to determine out of pocket expense if they have not yet received this vaccine. Advised may also receive vaccine at local pharmacy or Health Dept. Verbalized acceptance and understanding.  Screening Tests Health Maintenance  Topic Date Due   COVID-19 Vaccine (4 - Booster for Pfizer series) 01/19/2021   HEMOGLOBIN A1C  10/28/2021   OPHTHALMOLOGY EXAM  12/05/2021   FOOT EXAM  05/27/2022   TETANUS/TDAP  08/28/2024   Pneumonia Vaccine 76+ Years old  Completed   INFLUENZA VACCINE  Completed   Zoster Vaccines- Shingrix  Completed   HPV VACCINES  Aged Out    Health Maintenance  Health Maintenance Due  Topic Date Due   COVID-19 Vaccine (4 - Booster for [place] series) 01/19/2021    Colorectal cancer screening: No longer required.   Lung Cancer Screening: (Low Dose CT Chest recommended if Age 66-80 years, 30 pack-year currently smoking OR have quit w/in 15years.) does not qualify.   Lung Cancer Screening Referral:   Additional Screening:  Hepatitis C Screening: does not qualify;   Vision Screening: Recommended annual ophthalmology exams  for early detection of glaucoma and other disorders of the eye. Is the patient up to date with their annual eye exam?  Yes  Who is the provider or what is the name of the office in which the patient attends annual eye exams? Dr. Zigmund Daniel If pt is not established with a provider, would they like to be referred to a provider to establish care? No .   Dental Screening: Recommended annual dental exams for proper oral hygiene  Community Resource Referral / Chronic Care Management: CRR required this visit?  No   CCM required this visit?  No      Plan:     I have personally reviewed and noted the following in the patient's chart:   Medical and social history Use of alcohol, tobacco or illicit drugs  Current medications and supplements including  opioid prescriptions. Patient is not currently taking opioid prescriptions. Functional ability and status Nutritional status Physical activity Advanced directives List of other physicians Hospitalizations, surgeries, and ER visits in previous 12 months Vitals Screenings to include cognitive, depression, and falls Referrals and appointments  In addition, I have reviewed and discussed with patient certain preventive protocols, quality metrics, and best practice recommendations. A written personalized care plan for preventive services as well as general preventive health recommendations were provided to patient.     Leroy Kennedy, LPN   [id number]   Nurse Notes: patient stated at his last visit he weighed in at 201lb  his previous visit was 229lb  he wanted to make aware he wasn't trying to loose weight.     He has an upcoming appointment and will get weighed if he is continuing to loose weight he will schedule an appointment.

## 2021-08-17 ENCOUNTER — Ambulatory Visit: Payer: Medicare HMO

## 2021-08-19 DIAGNOSIS — N3281 Overactive bladder: Secondary | ICD-10-CM | POA: Diagnosis not present

## 2021-08-19 DIAGNOSIS — R351 Nocturia: Secondary | ICD-10-CM | POA: Diagnosis not present

## 2021-08-24 ENCOUNTER — Ambulatory Visit: Payer: Medicare HMO | Admitting: *Deleted

## 2021-08-24 ENCOUNTER — Other Ambulatory Visit: Payer: Self-pay

## 2021-08-24 DIAGNOSIS — Z5181 Encounter for therapeutic drug level monitoring: Secondary | ICD-10-CM

## 2021-08-24 DIAGNOSIS — I4891 Unspecified atrial fibrillation: Secondary | ICD-10-CM

## 2021-08-24 LAB — POCT INR: INR: 2.9 (ref 2.0–3.0)

## 2021-08-26 DIAGNOSIS — H353132 Nonexudative age-related macular degeneration, bilateral, intermediate dry stage: Secondary | ICD-10-CM | POA: Diagnosis not present

## 2021-08-26 DIAGNOSIS — H401112 Primary open-angle glaucoma, right eye, moderate stage: Secondary | ICD-10-CM | POA: Diagnosis not present

## 2021-08-26 DIAGNOSIS — H35371 Puckering of macula, right eye: Secondary | ICD-10-CM | POA: Diagnosis not present

## 2021-08-26 DIAGNOSIS — H401123 Primary open-angle glaucoma, left eye, severe stage: Secondary | ICD-10-CM | POA: Diagnosis not present

## 2021-08-28 NOTE — Progress Notes (Signed)
Chronic Care Management Pharmacy Note  09/09/2021 Name:  Marvin Foster MRN:  [id number] DOB:  May 15, 1934  Summary: PharmD follow up.  All meds reviewed, patient did have lone episode of vertigo that has not happened again.  Denies dizziness or HA.  FBG has remained around 120-130 depending on food the night prior.  Recommendations/Changes made from today's visit: None at this time  Plan: FU 6 months   Subjective: Marvin Foster is an 87 y.o. year old male who is a primary patient of Tabori, Aundra Millet, MD.  The CCM team was consulted for assistance with disease management and care coordination needs.    Engaged with patient by telephone for follow up visit in response to provider referral for pharmacy case management and/or care coordination services.   Consent to Services:  The patient was given the following information about Chronic Care Management services today, agreed to services, and gave verbal consent: 1. CCM service includes personalized support from designated clinical staff supervised by the primary care provider, including individualized plan of care and coordination with other care providers 2. 24/7 contact phone numbers for assistance for urgent and routine care needs. 3. Service will only be billed when office clinical staff spend 20 minutes or more in a month to coordinate care. 4. Only one practitioner may furnish and bill the service in a calendar month. 5.The patient may stop CCM services at any time (effective at the end of the month) by phone call to the office staff. 6. The patient will be responsible for cost sharing (co-pay) of up to 20% of the service fee (after annual deductible is met). Patient agreed to services and consent obtained.  Patient Care Team: [name], MD as PCP - General Marlou Porch Thana Farr, MD as PCP - Cardiology (Cardiology) Jerline Pain, MD as Consulting Physician (Cardiology) [place], [place]  (Specialist) Gardiner Barefoot, DPM as Consulting Physician (Podiatry) Lyndee Hensen, PT as Physical Therapist (Physical Therapy) Edythe Clarity, [facility] (Pharmacist)  Recent office visits:  06/22/21 Annye Asa, MD (PCP) - Family Medicine - Hyperlipidemia - Flu shot administered. Labs were ordered. Follow up in 2-3 months for physical.    01/01/21 Annye Asa, MD (PCP) - Family Medicine - Hyperlipidemia - No medication changes. Follow up in 3-4 months.     Recent consult visits:  06/11/21 Melida Quitter, MD - Otolaryngology - Left ear cerumen impaction - Ear lavage performed. Mupirocin ointment prescribed for nasal vestibule irritation. Follow up not indicated.   05/27/21 Gardiner Barefoot, DPM - Podiatry - No medication changes. Follow up in 1 year.    02/25/21 Lisabeth Pick, MD - Ophthalmology - Glaucoma of left eye - Latanoprost qhs OU Timolol qd ou prescribed. Follow up with Dr Zigmund Daniel as scheduled.    01/06/21 Dineen Kid, MD - Ophthalmology - Fuch's corneal dystrophy of both eyes - No notes available.    12/10/20 Leanor Kail PA-C - Cardiology - Atrial Fibrillation (Telemedicine) - Coumadin discontinued. Eliquis 5 mg 1 tablet daily started. Follow up in 12 months.      Hospital visits:  None in previous 6 months     Objective:  Lab Results  Component Value Date   CREATININE 0.96 06/22/2021   BUN 16 06/22/2021   GFR 72.06 06/22/2021   GFRNONAA 73 11/14/2020   GFRAA 85 11/14/2020   NA 139 06/22/2021   K 4.7 06/22/2021   CALCIUM 9.1 06/22/2021   CO2 26 06/22/2021   GLUCOSE 118 (  H) 06/22/2021    Lab Results  Component Value Date/Time   HGBA1C 7.0 (H) 04/27/2021 09:01 AM   HGBA1C 7.0 (H) 12/30/2020 01:37 PM   GFR 72.06 06/22/2021 11:55 AM   GFR 64.78 04/27/2021 09:01 AM   MICROALBUR 5.2 (H) 06/22/2021 11:55 AM   MICROALBUR 0.4 07/08/2010 08:22 AM    Last diabetic Eye exam:  Lab Results  Component Value Date/Time   HMDIABEYEEXA No Retinopathy  12/05/2020 03:57 PM    Last diabetic Foot exam:  Lab Results  Component Value Date/Time   HMDIABFOOTEX completed 05/31/2018 12:00 AM     Lab Results  Component Value Date   CHOL 99 06/22/2021   HDL 30.40 (L) 06/22/2021   LDLCALC 37 06/22/2021   LDLDIRECT 52.0 12/30/2020   TRIG 157.0 (H) 06/22/2021   CHOLHDL 3 06/22/2021    Hepatic Function Latest Ref Rng & Units 06/22/2021 07/18/2020 07/08/2020  Total Protein 6.0 - 8.3 g/dL 6.6 6.5 6.7  Albumin 3.5 - 5.2 g/dL 4.2 3.5 2.4(L)  AST 0 - 37 U/L 32 38(H) 151(H)  ALT 0 - 53 U/L 31 41 135(H)  Alk Phosphatase 39 - 117 U/L 54 59 58  Total Bilirubin 0.2 - 1.2 mg/dL 1.3(H) 0.9 1.3(H)  Bilirubin, Direct 0.0 - 0.3 mg/dL 0.4(H) 0.3 -    Lab Results  Component Value Date/Time   TSH 2.22 06/22/2021 11:55 AM   TSH 2.80 12/30/2020 01:37 PM    CBC Latest Ref Rng & Units 06/22/2021 11/13/2020 07/18/2020  WBC 4.0 - 10.5 K/uL 5.4 7.5 7.3  Hemoglobin 13.0 - 17.0 g/dL 13.3 12.7(L) 12.2(L)  Hematocrit 39.0 - 52.0 % 39.3 38.8 37.1(L)  Platelets 150.0 - 400.0 K/uL 143.0(L) 139(L) 241.0    No results found for: VD25OH  Clinical ASCVD: Yes  The ASCVD Risk score (Arnett DK, et al., 2019) failed to calculate for the following reasons:   The 2019 ASCVD risk score is only valid for ages 33 to 67    Depression screen PHQ 2/9 08/13/2021 06/22/2021 01/01/2021  Decreased Interest 0 0 0  Down, Depressed, Hopeless 0 0 0  PHQ - 2 Score 0 0 0  Altered sleeping - 0 0  Tired, decreased energy - 0 0  Change in appetite - 0 0  Feeling bad or failure about yourself  - 0 0  Trouble concentrating - 0 0  Moving slowly or fidgety/restless - 0 0  Suicidal thoughts - 0 0  PHQ-9 Score - 0 0  Difficult doing work/chores Not difficult at all Not difficult at all Not difficult at all  Some recent data might be hidden      Social History   Tobacco Use  Smoking Status Never  Smokeless Tobacco Never   BP Readings from Last 3 Encounters:  06/22/21 122/80  01/01/21  118/70  12/10/20 135/79   Pulse Readings from Last 3 Encounters:  06/22/21 79  01/01/21 67  12/10/20 70   Wt Readings from Last 3 Encounters:  08/24/21 232 lb 12.8 oz (105.6 kg)  06/22/21 201 lb (91.2 kg)  01/01/21 229 lb (103.9 kg)   BMI Readings from Last 3 Encounters:  08/24/21 34.38 kg/m  06/22/21 29.68 kg/m  01/01/21 33.82 kg/m    Assessment/Interventions: Review of patient past medical history, allergies, medications, health status, including review of consultants reports, laboratory and other test data, was performed as part of comprehensive evaluation and provision of chronic care management services.   SDOH:  (Social Determinants of Health) assessments and interventions performed:  Yes  Financial Resource Strain: Low Risk    Difficulty of Paying Living Expenses: Not hard at all    SDOH Screenings   Alcohol Screen: Low Risk    Last Alcohol Screening Score (AUDIT): 2  Depression (PHQ2-9): Low Risk    PHQ-2 Score: 0  Financial Resource Strain: Low Risk    Difficulty of Paying Living Expenses: Not hard at all  Food Insecurity: No Food Insecurity   Worried About Charity fundraiser in the Last Year: Never true   Ran Out of Food in the Last Year: Never true  Housing: Low Risk    Last Housing Risk Score: 0  Physical Activity: Inactive   Days of Exercise per Week: 0 days   Minutes of Exercise per Session: 0 min  Social Connections: Moderately Isolated   Frequency of Communication with Friends and Family: More than three times a week   Frequency of Social Gatherings with Friends and Family: More than three times a week   Attends Religious Services: Never   Marine scientist or Organizations: Yes   Attends Music therapist: More than 4 times per year   Marital Status: Divorced  Stress: No Stress Concern Present   Feeling of Stress : Not at all  Tobacco Use: Low Risk    Smoking Tobacco Use: Never   Smokeless Tobacco Use: Never   Passive  Exposure: Not on file  Transportation Needs: No Transportation Needs   Lack of Transportation (Medical): No   Lack of Transportation (Non-Medical): No    CCM Care Plan  Allergies  Allergen Reactions   Neomycin-Bacitracin Zn-Polymyx Rash   Penicillins Other (See Comments) and Rash    Pt states this was diagnosed years ago, unsure as to reaction. Will not use.    Enalapril     Stopped 11/2020 due to hyperkalemia due to this   Sulfa Antibiotics    Other Rash    Medications Reviewed Today     Reviewed by Edythe Clarity, [facility] (Pharmacist) on 09/09/21 at 0856  Med List Status: <None>   Medication Order Taking? Sig Documenting Provider Last Dose Status Informant  amLODipine (NORVASC) 2.5 MG tablet [id number]  Take 1 tablet (2.5 mg total) by mouth daily. Melina Copa N, PA-C  Expired 06/22/21 2359   atorvastatin (LIPITOR) 40 MG tablet [id number] Yes TAKE 1 TABLET BY MOUTH AT BEDTIME [name], MD Taking Active   benzonatate (TESSALON) 100 MG capsule [id number] Yes benzonatate 100 mg capsule  TAKE 1 CAPSULE BY MOUTH THREE TIMES DAILY AS NEEDED [provider] Taking Active   Calcium Carbonate-Vitamin D (CALCIUM 600 + D PO) [id number] Yes Take 1 tablet by mouth 2 (two) times daily. [provider] Taking Active Self  diclofenac Sodium (VOLTAREN) 1 % GEL [id number] Yes Apply 2 g topically 4 (four) times daily. [name], MD Taking Active Self  fluorometholone (FML) 0.1 % ophthalmic suspension [id number] Yes Place 1 drop into both eyes every 30 (thirty) days.  [provider] Taking Active Self  Lancets [name] ULTRASOFT) lancets [id number] Yes Use as instructed [name], MD Taking Active Self  latanoprost (XALATAN) 0.005 % ophthalmic solution [id number] Yes Place 1 drop into both eyes at bedtime.  [provider] Taking Active Self  levothyroxine (SYNTHROID) 75 MCG tablet [id number] Yes Take 1 tablet by mouth once daily [name], MD Taking Active   magnesium oxide (MAGOX 400) 400 (241.3 Mg) MG tablet [id number] Yes Take 1 tablet (  400 mg total) by mouth 2 (two) times daily. Charlie Pitter, PA-C Taking Active   metFORMIN (GLUCOPHAGE) 1000 MG tablet [id number] Yes Take 1 tablet by mouth twice daily [name], MD Taking Active   metoprolol tartrate (LOPRESSOR) 100 MG tablet [id number] Yes Take 1 tablet by mouth twice daily [name], MD Taking Active   Multiple Vitamin (MULTIVITAMIN) capsule [id number] Yes Take 1 capsule by mouth daily. [provider] Taking Active Self  Omega-3 Fatty Acids (FISH OIL) 1200 MG CAPS [id number] Yes Take 3 capsules by mouth daily. [provider] Taking Active Self  Roma Schanz test strip [id number] Yes USE 1 STRIP TO CHECK GLUCOSE TWICE DAILY AS DIRECTED [name], MD Taking Active   tamsulosin [facility]) 0.4 MG CAPS capsule [id number] Yes Take 1 capsule (0.4 mg total) by mouth daily. [name], MD Taking Active   timolol (TIMOPTIC) 0.5 % ophthalmic solution [id number] Yes Place 1 drop into both eyes daily.  [provider] Taking Active Self  warfarin (COUMADIN) 2.5 MG tablet [id number] Yes TAKE 1 TO 1 & 1/2 (ONE TO ONE & ONE-HALF) TABLETS BY MOUTH ONCE DAILY AS DIRECTED BY  THE  COUMADIN  CLINIC Jerline Pain, MD Taking Active             Patient Active Problem List   Diagnosis Date Noted   Glaucoma 10/16/2020   Coagulation defect ([place]) 05/28/2020   Pain due to onychomycosis of toenail of left foot 05/28/2020   Hypothyroid 09/21/2018   Hyperlipidemia associated with type 2 diabetes mellitus ([place]) 08/05/2015   Coronary artery disease due to lipid rich plaque 08/05/2015   Hx of colonic polyps 06/12/2015   Chronic anticoagulation 06/12/2015   Atrophy, Fuchs' 05/07/2015   Dermatitis of ear canal 05/07/2015   Encounter for therapeutic drug monitoring 11/06/2013   Atrial fibrillation ([place]) 07/24/2013   Long term  current use of anticoagulant therapy 07/24/2013   Bilateral leg weakness 06/07/2013   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   Scrotal mass 03/07/2012   General medical examination 02/28/2012   ANEMIA 02/18/2010   HEARING LOSS 12/22/2009   ASPARTATE AMINOTRANSFERASE, SERUM, ELEVATED 11/04/2008   LEG CRAMPS, NOCTURNAL 08/16/2008   Diabetes mellitus type II, controlled ([place]) 05/12/2007   Essential hypertension 03/27/2007   CORONARY ARTERY DISEASE 03/27/2007    Immunization History  Administered Date(s) Administered   Fluad Quad(high Dose 65+) 07/14/2019, 07/18/2020, 06/22/2021   Influenza Split 08/06/2015   Influenza Whole 08/14/2007, 07/23/2009, 07/10/2010, 08/11/2016   Influenza, High Dose Seasonal PF 07/30/2016   Influenza,inj,Quad PF,6+ Mos 08/14/2013, 08/13/2014, 08/10/2018   Influenza,inj,quad, With Preservative 11/11/2017   Influenza-Unspecified 08/06/2015, 08/04/2017   PFIZER(Purple Top)SARS-COV-2 Vaccination 12/04/2019, 12/25/2019, 11/24/2020   Pneumococcal Conjugate-13 01/16/2015   Pneumococcal Polysaccharide-23 12/22/2009, 01/26/2017   Pneumococcal-Unspecified 10/11/2016   Tdap 08/28/2014   Zoster Recombinat (Shingrix) 10/31/2018, 04/06/2019   Zoster, Live 08/20/2010    Conditions to be addressed/monitored:  HTN, CAD, Afib, Type II DM, HLD, Hypothyroidism  Care Plan : General Pharmacy (Adult)  Updates made by Edythe Clarity, RPH since 09/09/2021 12:00 AM     Problem: HTN, HLD, DM   Priority: High  Onset Date: 09/09/2021     Long-Range Goal: Patient-Specific Goal   Start Date: 09/09/2021  Expected End Date: 03/09/2022  This Visit's Progress: On track  Priority: High  Note:   Current Barriers:  None noted at this time  Pharmacist Clinical Goal(s):  Patient will maintain control of  glucose and lipids as evidenced by routine labs/home monitoring  through collaboration with PharmD and provider.   Interventions: 1:1  collaboration with [name], MD regarding development and update of comprehensive plan of care as evidenced by provider attestation and co-signature Inter-disciplinary care team collaboration (see longitudinal plan of care) Comprehensive medication review performed; medication list updated in electronic medical record  Hypertension (BP goal <130/80) -Controlled -Current treatment: Amlodipine 2.5 mg tablet daily Metoprolol Tartrate 100 mg tablet daily -Medications previously tried: enalapril, diltiazem  -Current home readings: has not checked at home recently, does have cuff at home -Current exercise habits: nothing routine, 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 -Denies hypotensive/hypertensive symptoms - did have a lone episode of Vertigo that has resolved -Educated on BP goals and benefits of medications for prevention of heart attack, stroke and kidney damage; Daily salt intake goal < 2300 mg; Importance of home blood pressure monitoring; Symptoms of hypotension and importance of maintaining adequate hydration; -Counseled to monitor BP at home periodically, document, and provide log at future appointments -Counseled on diet and exercise extensively Recommended to continue current medication  Hyperlipidemia: (LDL goal < 70) -Controlled -Current treatment: Atorvastatin 37m daily -Medications previously tried: none noted  -Most recent lipid panel is WNL - LDL is excellent -Educated on Cholesterol goals;  Benefits of statin for ASCVD risk reduction; Importance of limiting foods high in cholesterol; -Recommended to continue current medication  Diabetes (A1c goal <7%) -Controlled -Current medications: Metformin 1000 mg twice daily -Medications previously tried: none noted  -Current home glucose readings fasting glucose: 125 this morning, occasionally 130s but usually not any higher post prandial glucose: N/A -Denies  hypoglycemic/hyperglycemic symptoms  -Educated on A1c and blood sugar goals; Exercise goal of 150 minutes per week; Benefits of weight loss; Benefits of routine self-monitoring of blood sugar; -Counseled to check feet daily and get yearly eye exams -Recommended to continue current medication Continue routine monitoring of fasting daily at home, try and implement some walking if possible.  Continue to watch carbs, sweets in diet.   Patient Goals/Self-Care Activities Patient will:  - take medications as prescribed as evidenced by patient report and record review focus on medication adherence by pill counts  Follow Up Plan: The care management team will reach out to the patient again over the next 180 days.        Medication Assistance: None required.  Patient affirms current coverage meets needs.  Compliance/Adherence/Medication fill history: Care Gaps: None  Star-Rating Drugs: No gaps in fill dates  Patient's preferred pharmacy is:  [address]Phone: [phone]: [address] [address]Phone: [phone]: [phone]  We discussed: Benefits of medication synchronization, packaging and delivery as well as enhanced pharmacist oversight with [employer]. Patient decided to: Continue current medication management strategy  Care Plan and Follow Up Patient Decision:  Patient agrees to Care Plan and Follow-up.  Plan: The care management team will reach out to the patient again over the next 180 days.  CBeverly Milch PharmD [employer]([phone]

## 2021-08-29 ENCOUNTER — Other Ambulatory Visit: Payer: Self-pay | Admitting: Family Medicine

## 2021-08-31 ENCOUNTER — Encounter: Payer: Self-pay | Admitting: Family Medicine

## 2021-09-01 ENCOUNTER — Other Ambulatory Visit: Payer: Self-pay | Admitting: Family Medicine

## 2021-09-01 DIAGNOSIS — E785 Hyperlipidemia, unspecified: Secondary | ICD-10-CM

## 2021-09-01 DIAGNOSIS — E1169 Type 2 diabetes mellitus with other specified complication: Secondary | ICD-10-CM

## 2021-09-07 DIAGNOSIS — M1712 Unilateral primary osteoarthritis, left knee: Secondary | ICD-10-CM | POA: Diagnosis not present

## 2021-09-07 NOTE — Telephone Encounter (Signed)
Pt called in asking for a refill on the flomax, please advise

## 2021-09-08 ENCOUNTER — Other Ambulatory Visit: Payer: Self-pay

## 2021-09-08 MED ORDER — TAMSULOSIN HCL 0.4 MG PO CAPS: 0.4000 mg | ORAL_CAPSULE | Freq: Every day | ORAL | 3 refills | Status: DC

## 2021-09-09 ENCOUNTER — Ambulatory Visit (INDEPENDENT_AMBULATORY_CARE_PROVIDER_SITE_OTHER): Payer: Medicare HMO | Admitting: Pharmacist

## 2021-09-09 DIAGNOSIS — E1140 Type 2 diabetes mellitus with diabetic neuropathy, unspecified: Secondary | ICD-10-CM

## 2021-09-09 DIAGNOSIS — E1139 Type 2 diabetes mellitus with other diabetic ophthalmic complication: Secondary | ICD-10-CM

## 2021-09-09 DIAGNOSIS — I10 Essential (primary) hypertension: Secondary | ICD-10-CM

## 2021-09-09 DIAGNOSIS — E1169 Type 2 diabetes mellitus with other specified complication: Secondary | ICD-10-CM

## 2021-09-09 DIAGNOSIS — Z7984 Long term (current) use of oral hypoglycemic drugs: Secondary | ICD-10-CM

## 2021-09-09 DIAGNOSIS — E785 Hyperlipidemia, unspecified: Secondary | ICD-10-CM

## 2021-09-09 NOTE — Patient Instructions (Addendum)
Visit Information   Goals Addressed             This Visit's Progress    Monitor and Manage My Blood Sugar-Diabetes Type 2       Timeframe:  Long-Range Goal Priority:  High Start Date:    09/09/21                         Expected End Date:  03/09/22                     Follow Up Date 02/30/23    - check blood sugar at prescribed times - check blood sugar if I feel it is too high or too low - enter blood sugar readings and medication or insulin into daily log    Why is this important?   Checking your blood sugar at home helps to keep it from getting very high or very low.  Writing the results in a diary or log helps the doctor know how to care for you.  Your blood sugar log should have the time, date and the results.  Also, write down the amount of insulin or other medicine that you take.  Other information, like what you ate, exercise done and how you were feeling, will also be helpful.     Notes:        Patient Care Plan: General Pharmacy (Adult)     Problem Identified: HTN, HLD, DM   Priority: High  Onset Date: 09/09/2021     Long-Range Goal: Patient-Specific Goal   Start Date: 09/09/2021  Expected End Date: 03/09/2022  This Visit's Progress: On track  Priority: High  Note:   Current Barriers:  None noted at this time  Pharmacist Clinical Goal(s):  Patient will maintain control of glucose and lipids as evidenced by routine labs/home monitoring  through collaboration with PharmD and provider.   Interventions: 1:1 collaboration with [name], MD regarding development and update of comprehensive plan of care as evidenced by provider attestation and co-signature Inter-disciplinary care team collaboration (see longitudinal plan of care) Comprehensive medication review performed; medication list updated in electronic medical record  Hypertension (BP goal <130/80) -Controlled -Current treatment: Amlodipine 2.5 mg tablet daily Metoprolol Tartrate 100  mg tablet daily -Medications previously tried: enalapril, diltiazem  -Current home readings: has not checked at home recently, does have cuff at home -Current exercise habits: nothing routine, 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 -Denies hypotensive/hypertensive symptoms - did have a lone episode of Vertigo that has resolved -Educated on BP goals and benefits of medications for prevention of heart attack, stroke and kidney damage; Daily salt intake goal < 2300 mg; Importance of home blood pressure monitoring; Symptoms of hypotension and importance of maintaining adequate hydration; -Counseled to monitor BP at home periodically, document, and provide log at future appointments -Counseled on diet and exercise extensively Recommended to continue current medication  Hyperlipidemia: (LDL goal < 70) -Controlled -Current treatment: Atorvastatin 40mg  daily -Medications previously tried: none noted  -Most recent lipid panel is WNL - LDL is excellent -Educated on Cholesterol goals;  Benefits of statin for ASCVD risk reduction; Importance of limiting foods high in cholesterol; -Recommended to continue current medication  Diabetes (A1c goal <7%) -Controlled -Current medications: Metformin 1000 mg twice daily -Medications previously tried: none noted  -Current home glucose readings fasting glucose: 125 this morning, occasionally 130s but usually not any higher  post prandial glucose: N/A -Denies hypoglycemic/hyperglycemic symptoms  -Educated on A1c and blood sugar goals; Exercise goal of 150 minutes per week; Benefits of weight loss; Benefits of routine self-monitoring of blood sugar; -Counseled to check feet daily and get yearly eye exams -Recommended to continue current medication Continue routine monitoring of fasting daily at home, try and implement some walking if possible.  Continue to watch carbs, sweets in diet.   Patient  Goals/Self-Care Activities Patient will:  - take medications as prescribed as evidenced by patient report and record review focus on medication adherence by pill counts  Follow Up Plan: The care management team will reach out to the patient again over the next 180 days.        Patient verbalizes understanding of instructions provided today and agrees to view in [place].  Telephone follow up appointment with pharmacy team member scheduled for: 6 months  Edythe Clarity, [place] Pharmacist [phone]

## 2021-09-17 ENCOUNTER — Other Ambulatory Visit: Payer: Self-pay

## 2021-09-17 ENCOUNTER — Encounter (INDEPENDENT_AMBULATORY_CARE_PROVIDER_SITE_OTHER): Payer: Medicare HMO | Admitting: Ophthalmology

## 2021-09-17 DIAGNOSIS — H353221 Exudative age-related macular degeneration, left eye, with active choroidal neovascularization: Secondary | ICD-10-CM

## 2021-09-17 DIAGNOSIS — H35033 Hypertensive retinopathy, bilateral: Secondary | ICD-10-CM | POA: Diagnosis not present

## 2021-09-17 DIAGNOSIS — I10 Essential (primary) hypertension: Secondary | ICD-10-CM

## 2021-09-17 DIAGNOSIS — H35371 Puckering of macula, right eye: Secondary | ICD-10-CM

## 2021-09-17 DIAGNOSIS — H353112 Nonexudative age-related macular degeneration, right eye, intermediate dry stage: Secondary | ICD-10-CM

## 2021-09-17 DIAGNOSIS — H43813 Vitreous degeneration, bilateral: Secondary | ICD-10-CM | POA: Diagnosis not present

## 2021-09-23 ENCOUNTER — Other Ambulatory Visit: Payer: Self-pay | Admitting: Family Medicine

## 2021-10-07 ENCOUNTER — Other Ambulatory Visit: Payer: Self-pay

## 2021-10-07 ENCOUNTER — Ambulatory Visit: Payer: Medicare HMO

## 2021-10-07 DIAGNOSIS — I4891 Unspecified atrial fibrillation: Secondary | ICD-10-CM

## 2021-10-07 DIAGNOSIS — Z5181 Encounter for therapeutic drug level monitoring: Secondary | ICD-10-CM | POA: Diagnosis not present

## 2021-10-07 LAB — POCT INR: INR: 2.2 (ref 2.0–3.0)

## 2021-10-07 NOTE — Patient Instructions (Signed)
Description   *DOES NOT NEED A PRINTOUT*  Continue taking 1 tablet daily except for 1.5 tablets on Wednesdays. Repeat INR in 6 weeks. Call us with any medication changes or concerns # [phone] [facility]in [facility], Main # [phone].

## 2021-10-09 ENCOUNTER — Other Ambulatory Visit: Payer: Self-pay | Admitting: Family Medicine

## 2021-10-14 DIAGNOSIS — K219 Gastro-esophageal reflux disease without esophagitis: Secondary | ICD-10-CM | POA: Diagnosis not present

## 2021-10-14 DIAGNOSIS — G8929 Other chronic pain: Secondary | ICD-10-CM | POA: Diagnosis not present

## 2021-10-14 DIAGNOSIS — I10 Essential (primary) hypertension: Secondary | ICD-10-CM | POA: Diagnosis not present

## 2021-10-14 DIAGNOSIS — K08409 Partial loss of teeth, unspecified cause, unspecified class: Secondary | ICD-10-CM | POA: Diagnosis not present

## 2021-10-14 DIAGNOSIS — E039 Hypothyroidism, unspecified: Secondary | ICD-10-CM | POA: Diagnosis not present

## 2021-10-14 DIAGNOSIS — I4891 Unspecified atrial fibrillation: Secondary | ICD-10-CM | POA: Diagnosis not present

## 2021-10-14 DIAGNOSIS — E669 Obesity, unspecified: Secondary | ICD-10-CM | POA: Diagnosis not present

## 2021-10-14 DIAGNOSIS — M1990 Unspecified osteoarthritis, unspecified site: Secondary | ICD-10-CM | POA: Diagnosis not present

## 2021-10-14 DIAGNOSIS — E119 Type 2 diabetes mellitus without complications: Secondary | ICD-10-CM | POA: Diagnosis not present

## 2021-10-14 DIAGNOSIS — I2510 Atherosclerotic heart disease of native coronary artery without angina pectoris: Secondary | ICD-10-CM | POA: Diagnosis not present

## 2021-10-14 DIAGNOSIS — Z008 Encounter for other general examination: Secondary | ICD-10-CM | POA: Diagnosis not present

## 2021-10-14 DIAGNOSIS — E785 Hyperlipidemia, unspecified: Secondary | ICD-10-CM | POA: Diagnosis not present

## 2021-10-14 DIAGNOSIS — D6869 Other thrombophilia: Secondary | ICD-10-CM | POA: Diagnosis not present

## 2021-10-21 ENCOUNTER — Encounter (INDEPENDENT_AMBULATORY_CARE_PROVIDER_SITE_OTHER): Payer: Medicare HMO | Admitting: Ophthalmology

## 2021-10-21 ENCOUNTER — Other Ambulatory Visit: Payer: Self-pay

## 2021-10-21 DIAGNOSIS — I10 Essential (primary) hypertension: Secondary | ICD-10-CM

## 2021-10-21 DIAGNOSIS — H353221 Exudative age-related macular degeneration, left eye, with active choroidal neovascularization: Secondary | ICD-10-CM

## 2021-10-21 DIAGNOSIS — H35033 Hypertensive retinopathy, bilateral: Secondary | ICD-10-CM | POA: Diagnosis not present

## 2021-10-21 DIAGNOSIS — H35372 Puckering of macula, left eye: Secondary | ICD-10-CM

## 2021-10-21 DIAGNOSIS — H43813 Vitreous degeneration, bilateral: Secondary | ICD-10-CM | POA: Diagnosis not present

## 2021-10-21 DIAGNOSIS — H353112 Nonexudative age-related macular degeneration, right eye, intermediate dry stage: Secondary | ICD-10-CM

## 2021-10-29 ENCOUNTER — Encounter: Payer: Self-pay | Admitting: Family Medicine

## 2021-10-29 NOTE — Telephone Encounter (Signed)
Do not see a upcomming follow up until May do you want me to set something up with patient to come in and review questions and results with you?

## 2021-10-30 ENCOUNTER — Other Ambulatory Visit: Payer: Self-pay | Admitting: Physician Assistant

## 2021-11-04 ENCOUNTER — Other Ambulatory Visit: Payer: Self-pay | Admitting: Cardiology

## 2021-11-04 NOTE — Telephone Encounter (Signed)
Prescription refill request received for warfarin [place]: 12/10/20 ([facility]) - Pt has appt with Dr Marlou Porch on 11/11/21  Next INR check: 11/18/21 Warfarin tablet strength: 2.5mg   Appropriate dose and refill sent to requested pharmacy.

## 2021-11-09 ENCOUNTER — Ambulatory Visit: Payer: Medicare HMO | Admitting: Cardiology

## 2021-11-11 ENCOUNTER — Ambulatory Visit: Payer: Medicare HMO | Admitting: Cardiology

## 2021-11-11 ENCOUNTER — Other Ambulatory Visit: Payer: Self-pay

## 2021-11-11 ENCOUNTER — Encounter: Payer: Self-pay | Admitting: Cardiology

## 2021-11-11 VITALS — BP 130/80 | HR 66 | Ht 69.0 in | Wt 240.0 lb

## 2021-11-11 DIAGNOSIS — M79605 Pain in left leg: Secondary | ICD-10-CM

## 2021-11-11 DIAGNOSIS — Z7901 Long term (current) use of anticoagulants: Secondary | ICD-10-CM

## 2021-11-11 DIAGNOSIS — I480 Paroxysmal atrial fibrillation: Secondary | ICD-10-CM

## 2021-11-11 DIAGNOSIS — M79604 Pain in right leg: Secondary | ICD-10-CM

## 2021-11-11 DIAGNOSIS — I350 Nonrheumatic aortic (valve) stenosis: Secondary | ICD-10-CM | POA: Insufficient documentation

## 2021-11-11 NOTE — Patient Instructions (Signed)
Medication Instructions:  The current medical regimen is effective;  continue present plan and medications.  *If you need a refill on your cardiac medications before your next appointment, please call your pharmacy*  Testing/Procedures: Your physician has requested that you have an echocardiogram. Echocardiography is a painless test that uses sound waves to create images of your heart. It provides your doctor with information about the size and shape of your heart and how well your hearts chambers and valves are working. This procedure takes approximately one hour. There are no restrictions for this procedure.  Your physician has requested that you have a lower extremity arterial exercise duplex. During this test, exercise and ultrasound are used to evaluate arterial blood flow in the legs. Allow one hour for this exam. There are no restrictions or special instructions.  Follow-Up: At [facility], you and your health needs are our priority.  As part of our continuing mission to provide you with exceptional heart care, we have created designated Provider Care Teams.  These Care Teams include your primary Cardiologist (physician) and Advanced Practice Providers (APPs -  Physician Assistants and Nurse Practitioners) who all work together to provide you with the care you need, when you need it.  We recommend signing up for the patient portal called "MyChart".  Sign up information is provided on this After Visit Summary.  MyChart is used to connect with patients for Virtual Visits (Telemedicine).  Patients are able to view lab/test results, encounter notes, upcoming appointments, etc.  Non-urgent messages can be sent to your provider as well.   To learn more about what you can do with MyChart, go to [email or web address].    Your next appointment:   1 year(s)  The format for your next appointment:   In Person  Provider:   Candee Furbish, MD    Thank you for choosing [facility]!!

## 2021-11-11 NOTE — Assessment & Plan Note (Signed)
Moderate aortic stenosis last checked.  We will go ahead and repeat echocardiogram.  Murmur heard on exam.  Discussed potential replacement at some point.  He is short of breath with ambulation.  This is in part secondary to conditioning.Marvin Foster

## 2021-11-11 NOTE — Assessment & Plan Note (Signed)
Atorvastatin 40 mg once a day.  No myalgias.  Last LDL 37, excellent.

## 2021-11-11 NOTE — Assessment & Plan Note (Signed)
Permanent atrial fibrillation.  Well rate controlled.  Continue with current medication strategy.  Metoprolol 100 mg twice a day.

## 2021-11-11 NOTE — Progress Notes (Signed)
Cardiology Office Note:    Date:  11/11/2021   ID:  Marvin Foster, DOB Jun 16, 1934, MRN [id number]  PCP:  [name], MD   [facility] Providers Cardiologist:  Candee Furbish, MD     Referring MD: [name], MD    History of Present Illness:    Marvin Foster is a 88 y.o. male here for follow-up coronary artery disease here for the follow-up of coronary artery disease LAD stent hypertension with permanent atrial fibrillation.  Proximal LAD, Marvin Foster,  stent was placed back on 03/01/2005 with diabetes hypertension.  Coumadin clinic sees him for his atrial fibrillation anticoagulation high risk monitoring.  Overall he is under good rate control with right bundle branch block heart rate 66 bpm.  [employer].  Right side ? PAD on screening. Leg issues.  He has had leg pain especially knee pain from osteoarthritis. Lost son 2021 to Covid.  His friends created a short for him.  He was wearing it today.  It stated no regrets, the last text message he sent them.  Past Medical History:  Diagnosis Date   Acute respiratory failure due to COVID-19 ([place]) 07/07/2020   ANEMIA 02/18/2010   Qualifier: Diagnosis of  By: Birdie Riddle MD, Belenda Cruise     Arthritis 12/2005   left hand   Arthritis    Left knee   ASPARTATE AMINOTRANSFERASE, SERUM, ELEVATED 11/04/2008   Qualifier: Diagnosis of  By: Birdie Riddle MD, [place]     Atrial fibrillation ([place]) 07/24/2013   Blood transfusion without reported diagnosis    Bursitis of knee    right knee   CAD (coronary artery disease)    Chronic anticoagulation 06/12/2015   Coagulation defect ([place]) 05/28/2020   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   CORONARY ARTERY DISEASE 03/27/2007   Qualifier: Diagnosis of  By: Cletus Gash MD, [place]     Diabetes mellitus    Diverticulosis    Glaucoma    left eye   Hx of colonic polyps 06/12/2015   Hyperlipidemia    Hypertension    Hypothyroid 09/21/2018   Long  term current use of anticoagulant therapy 07/24/2013   Neuropathy    with pain   Pain due to onychomycosis of toenail of left foot 05/28/2020   Permanent atrial fibrillation (HCC)    RBBB    Scrotal mass 03/07/2012   Spinal stenosis 01/20/05   Tubular adenoma of colon     Past Surgical History:  Procedure Laterality Date   BACK SURGERY     CATARACT EXTRACTION  08/11/09   right   corneal endothelial Left 2013   CORNEAL TRANSPLANT Left    eye   CORONARY ANGIOPLASTY WITH STENT PLACEMENT     EYE SURGERY     SMALL INTESTINE SURGERY     SPINE SURGERY     VASECTOMY      Current Medications: Current Meds  Medication Sig   amLODipine (NORVASC) 2.5 MG tablet Take 1 tablet by mouth once daily   atorvastatin (LIPITOR) 40 MG tablet TAKE 1 TABLET BY MOUTH AT BEDTIME   benzonatate (TESSALON) 100 MG capsule benzonatate 100 mg capsule  TAKE 1 CAPSULE BY MOUTH THREE TIMES DAILY AS NEEDED   Calcium Carbonate-Vitamin D (CALCIUM 600 + D PO) Take 1 tablet by mouth 2 (two) times daily.   diclofenac Sodium (VOLTAREN) 1 % GEL Apply 2 g topically 4 (four) times daily.   fluorometholone (FML) 0.1 % ophthalmic suspension Place 1  drop into both eyes every 30 (thirty) days.    Lancets (ONETOUCH ULTRASOFT) lancets Use as instructed   latanoprost (XALATAN) 0.005 % ophthalmic solution Place 1 drop into both eyes at bedtime.    levothyroxine (SYNTHROID) 75 MCG tablet Take 1 tablet by mouth once daily   magnesium oxide (MAGOX 400) 400 (241.3 Mg) MG tablet Take 1 tablet (400 mg total) by mouth 2 (two) times daily.   metFORMIN (GLUCOPHAGE) 1000 MG tablet Take 1 tablet by mouth twice daily   metoprolol tartrate (LOPRESSOR) 100 MG tablet Take 1 tablet by mouth twice daily   Multiple Vitamin (MULTIVITAMIN) capsule Take 1 capsule by mouth daily.   Omega-3 Fatty Acids (FISH OIL) 1200 MG CAPS Take 3 capsules by mouth daily.   ONETOUCH VERIO test strip USE 1 STRIP TO CHECK GLUCOSE TWICE DAILY AS DIRECTED   tamsulosin  (FLOMAX) 0.4 MG CAPS capsule Take 1 capsule (0.4 mg total) by mouth daily.   timolol (TIMOPTIC) 0.5 % ophthalmic solution Place 1 drop into both eyes daily.    warfarin (COUMADIN) 2.5 MG tablet TAKE 1 TO 1 & 1/2 (ONE TO ONE & ONE-HALF) TABLETS BY MOUTH ONCE DAILY AS DIRECTED BY  THE  COUMADIN  CLINIC     Allergies:   Neomycin-bacitracin zn-polymyx, Penicillins, Enalapril, Sulfa antibiotics, and Other   Social History   Socioeconomic History   Marital status: Divorced    Spouse name: Not on file   Number of children: Not on file   Years of education: Not on file   Highest education level: Not on file  Occupational History   Occupation: retired  Tobacco Use   Smoking status: Never   Smokeless tobacco: Never  Vaping Use   Vaping Use: Never used  Substance and Sexual Activity   Alcohol use: Yes    Alcohol/week: 0.0 standard drinks    Comment: Very seldom---socially   Drug use: No   Sexual activity: Not Currently    Partners: Female  Other Topics Concern   Not on file  Social History Narrative   Volunteers at [employer]   Financial Resource Strain: Low Risk    Difficulty of Paying Living Expenses: Not hard at all  Food Insecurity: No Food Insecurity   Worried About Charity fundraiser in the Last Year: Never true   Arboriculturist in the Last Year: Never true  Transportation Needs: No Transportation Needs   Lack of Transportation (Medical): No   Lack of Transportation (Non-Medical): No  Physical Activity: Inactive   Days of Exercise per Week: 0 days   Minutes of Exercise per Session: 0 min  Stress: No Stress Concern Present   Feeling of Stress : Not at all  Social Connections: Moderately Isolated   Frequency of Communication with Friends and Family: More than three times a week   Frequency of Social Gatherings with Friends and Family: More than three times a week   Attends Religious Services: Never   Marine scientist or  Organizations: Yes   Attends Music therapist: More than 4 times per year   Marital Status: Divorced     Family History: The patient's family history includes Heart failure in his father and mother. There is no history of Stomach cancer, Colon cancer, Pancreatic cancer, or Throat cancer.  ROS:   Please see the history of present illness.     All other systems reviewed and  are negative.  EKGs/Labs/Other Studies Reviewed:    EKG:  EKG is  ordered today.  The ekg ordered today demonstrates atrial fibrillation 66 right bundle branch block  Recent Labs: 11/21/2020: Magnesium 1.6 06/22/2021: ALT 31; BUN 16; Creatinine, Ser 0.96; Hemoglobin 13.3; Platelets 143.0; Potassium 4.7; Sodium 139; TSH 2.22  Recent Lipid Panel    Component Value Date/Time   CHOL 99 06/22/2021 1155   TRIG 157.0 (H) 06/22/2021 1155   HDL 30.40 (L) 06/22/2021 1155   CHOLHDL 3 06/22/2021 1155   VLDL 31.4 06/22/2021 1155   LDLCALC 37 06/22/2021 1155   LDLDIRECT 52.0 12/30/2020 1337            Physical Exam:    VS:  BP 130/80 (BP Location: Left Arm, Patient Position: Sitting, Cuff Size: Normal)    Pulse 66    Ht 5\' 9"  (1.753 m)    Wt 240 lb (108.9 kg)    SpO2 95%    BMI 35.44 kg/m     Wt Readings from Last 3 Encounters:  11/11/21 240 lb (108.9 kg)  08/24/21 232 lb 12.8 oz (105.6 kg)  06/22/21 201 lb (91.2 kg)     GEN:  Well nourished, well developed in no acute distress HEENT: Normal NECK: No JVD; No carotid bruits LYMPHATICS: No lymphadenopathy CARDIAC: Irregularly irregular, no murmurs, no rubs, gallops, difficult to assess distal pulses. RESPIRATORY:  Clear to auscultation without rales, wheezing or rhonchi  ABDOMEN: Soft, non-tender, non-distended MUSCULOSKELETAL:  No edema; No deformity  SKIN: Warm and dry NEUROLOGIC:  Alert and oriented x 3 PSYCHIATRIC:  Normal affect   ASSESSMENT:    1. Moderate aortic stenosis   2. Paroxysmal atrial fibrillation (HCC)   3. Pain in both lower  extremities   4. Nonrheumatic aortic valve stenosis   5. Chronic anticoagulation    PLAN:    In order of problems listed above:  Atrial fibrillation (HCC) Permanent atrial fibrillation.  Well rate controlled.  Continue with current medication strategy.  Metoprolol 100 mg twice a day.  Coronary artery disease due to lipid rich plaque Atorvastatin 40 mg once a day.  No myalgias.  Last LDL 37, excellent.  Aortic stenosis Moderate aortic stenosis last checked.  We will go ahead and repeat echocardiogram.  Murmur heard on exam.  Discussed potential replacement at some point.  He is short of breath with ambulation.  This is in part secondary to conditioning..  Chronic anticoagulation Continue with Coumadin.  Closely monitored.  INR 2.2.   Checking lower extremity ABIs vascular studies given his screening tool that was abnormal in his right leg with his insurance company.      Medication Adjustments/Labs and Tests Ordered: Current medicines are reviewed at length with the patient today.  Concerns regarding medicines are outlined above.  Orders Placed This Encounter  Procedures   EKG 12-Lead   ECHOCARDIOGRAM COMPLETE   VAS [place] ABI WITH/WO TBI   VAS [place] LOWER EXTREMITY ARTERIAL DUPLEX   No orders of the defined types were placed in this encounter.   Patient Instructions  Medication Instructions:  The current medical regimen is effective;  continue present plan and medications.  *If you need a refill on your cardiac medications before your next appointment, please call your pharmacy*  Testing/Procedures: Your physician has requested that you have an echocardiogram. Echocardiography is a painless test that uses sound waves to create images of your heart. It provides your doctor with information about the size and shape of your heart and  how well your hearts chambers and valves are working. This procedure takes approximately one hour. There are no restrictions for this  procedure.  Your physician has requested that you have a lower extremity arterial exercise duplex. During this test, exercise and ultrasound are used to evaluate arterial blood flow in the legs. Allow one hour for this exam. There are no restrictions or special instructions.  Follow-Up: At [facility], you and your health needs are our priority.  As part of our continuing mission to provide you with exceptional heart care, we have created designated Provider Care Teams.  These Care Teams include your primary Cardiologist (physician) and Advanced Practice Providers (APPs -  Physician Assistants and Nurse Practitioners) who all work together to provide you with the care you need, when you need it.  We recommend signing up for the patient portal called "MyChart".  Sign up information is provided on this After Visit Summary.  MyChart is used to connect with patients for Virtual Visits (Telemedicine).  Patients are able to view lab/test results, encounter notes, upcoming appointments, etc.  Non-urgent messages can be sent to your provider as well.   To learn more about what you can do with MyChart, go to [email or web address].    Your next appointment:   1 year(s)  The format for your next appointment:   In Person  Provider:   Candee Furbish, MD    Thank you for choosing [facility]!!      Signed, Candee Furbish, MD  11/11/2021 10:12 AM    [place]

## 2021-11-11 NOTE — Assessment & Plan Note (Signed)
Continue with Coumadin.  Closely monitored.  INR 2.2.

## 2021-11-16 ENCOUNTER — Telehealth: Payer: Self-pay | Admitting: Pharmacist

## 2021-11-16 NOTE — Progress Notes (Signed)
Chronic Care Management Pharmacy Assistant   Name: Marvin Foster  MRN: [id number] DOB: [birth year]   Reason for Encounter: Disease State - General Adherence Call     Recent office visits:  None noted.   Recent consult visits:  11/11/21 Candee Furbish, MD - Cardiology - Moderate Aortic Stenosis - ABI and Arterial Duplex ordered. ECHO and EKG completed. No medication changes. Follow up in 1 year.   Hospital visits:  None in previous 6 months  Medications: Outpatient Encounter Medications as of 11/16/2021  Medication Sig   amLODipine (NORVASC) 2.5 MG tablet Take 1 tablet by mouth once daily   atorvastatin (LIPITOR) 40 MG tablet TAKE 1 TABLET BY MOUTH AT BEDTIME   benzonatate (TESSALON) 100 MG capsule benzonatate 100 mg capsule  TAKE 1 CAPSULE BY MOUTH THREE TIMES DAILY AS NEEDED   Calcium Carbonate-Vitamin D (CALCIUM 600 + D PO) Take 1 tablet by mouth 2 (two) times daily.   diclofenac Sodium (VOLTAREN) 1 % GEL Apply 2 g topically 4 (four) times daily.   fluorometholone (FML) 0.1 % ophthalmic suspension Place 1 drop into both eyes every 30 (thirty) days.    Lancets (ONETOUCH ULTRASOFT) lancets Use as instructed   latanoprost (XALATAN) 0.005 % ophthalmic solution Place 1 drop into both eyes at bedtime.    levothyroxine (SYNTHROID) 75 MCG tablet Take 1 tablet by mouth once daily   magnesium oxide (MAGOX 400) 400 (241.3 Mg) MG tablet Take 1 tablet (400 mg total) by mouth 2 (two) times daily.   metFORMIN (GLUCOPHAGE) 1000 MG tablet Take 1 tablet by mouth twice daily   metoprolol tartrate (LOPRESSOR) 100 MG tablet Take 1 tablet by mouth twice daily   Multiple Vitamin (MULTIVITAMIN) capsule Take 1 capsule by mouth daily.   Omega-3 Fatty Acids (FISH OIL) 1200 MG CAPS Take 3 capsules by mouth daily.   ONETOUCH VERIO test strip USE 1 STRIP TO CHECK GLUCOSE TWICE DAILY AS DIRECTED   tamsulosin (FLOMAX) 0.4 MG CAPS capsule Take 1 capsule (0.4 mg total) by mouth daily.   timolol (TIMOPTIC) 0.5 %  ophthalmic solution Place 1 drop into both eyes daily.    warfarin (COUMADIN) 2.5 MG tablet TAKE 1 TO 1 & 1/2 (ONE TO ONE & ONE-HALF) TABLETS BY MOUTH ONCE DAILY AS DIRECTED BY  THE  COUMADIN  CLINIC   No facility-administered encounter medications on file as of 11/16/2021.   Have you had any problems recently with your health? Patient denied having any recent problems with his health.   Have you had any problems with your pharmacy? Patient denied any problems with his current pharmacy.   What issues or side effects are you having with your medications? Patient denied any side effects or issues with his current medications.   What would you like me to pass along to Leata Mouse, CPP for them to help you with?  Patient states he has some tests coming up to check the blood flow in his legs (ABI's) with the cardiologist in a few weeks.   What can we do to take care of you better? Patient did not have any recommendations at this time.   Care Gaps  AWV: done 08/13/21 Colonoscopy: done 11/16/18 DM Eye Exam: due 12/05/21 DM Foot Exam: due 05/27/22 Microalbumin: N/A HbgAIC: done 05/07/21 (7.0) DEXA: N/A Mammogram: N/A   Star Rating Drugs: atorvastatin (LIPITOR) 40 MG tablet - last filled 09/02/21 90 days  metFORMIN (GLUCOPHAGE) 1000 MG tablet - last filled 08/31/21 90 days  Future Appointments  Date Time Provider [place]  11/18/2021 10:00 AM [facility]  11/26/2021  9:35 AM [facility] ECHO 1 [facility]  12/02/2021  1:00 PM Hayden Pedro, MD T[facility]TRE None  12/03/2021  9:00 AM [facility] NL VASC 2 [facility]-SE[facility]I [facility]  03/10/2022  3:30 PM [facility] CCM PHARMACIST [facility] [place], [place] Clinical Pharmacist Assistant  [phone]

## 2021-11-18 ENCOUNTER — Other Ambulatory Visit: Payer: Self-pay

## 2021-11-18 ENCOUNTER — Ambulatory Visit: Payer: Medicare HMO | Admitting: *Deleted

## 2021-11-18 DIAGNOSIS — Z5181 Encounter for therapeutic drug level monitoring: Secondary | ICD-10-CM

## 2021-11-18 DIAGNOSIS — I4891 Unspecified atrial fibrillation: Secondary | ICD-10-CM

## 2021-11-18 LAB — POCT INR: INR: 3.1 — AB (ref 2.0–3.0)

## 2021-11-18 NOTE — Patient Instructions (Signed)
Description   *DOES NOT NEED A PRINTOUT*  Today take 1 tablet then continue taking 1 tablet daily except for 1.5 tablets on Wednesdays. Repeat INR in 6 weeks. Call us with any medication changes or concerns # [phone] Coumadin Clinic, Main # [phone].

## 2021-11-23 ENCOUNTER — Other Ambulatory Visit: Payer: Self-pay | Admitting: Family Medicine

## 2021-11-23 DIAGNOSIS — E1169 Type 2 diabetes mellitus with other specified complication: Secondary | ICD-10-CM

## 2021-11-26 ENCOUNTER — Other Ambulatory Visit: Payer: Self-pay

## 2021-11-26 ENCOUNTER — Ambulatory Visit (HOSPITAL_COMMUNITY): Payer: Medicare HMO | Attending: Internal Medicine

## 2021-11-26 DIAGNOSIS — I350 Nonrheumatic aortic (valve) stenosis: Secondary | ICD-10-CM | POA: Diagnosis not present

## 2021-11-26 LAB — ECHOCARDIOGRAM COMPLETE
AR max vel: 1.15 cm2
AV Area VTI: 1.05 cm2
AV Area mean vel: 0.96 cm2
AV Mean grad: 31 mmHg
AV Peak grad: 43.8 mmHg
Ao pk vel: 3.31 m/s
S' Lateral: 3.5 cm

## 2021-12-01 ENCOUNTER — Other Ambulatory Visit: Payer: Self-pay | Admitting: *Deleted

## 2021-12-01 DIAGNOSIS — I350 Nonrheumatic aortic (valve) stenosis: Secondary | ICD-10-CM

## 2021-12-01 NOTE — Progress Notes (Signed)
Normal pump function.  Moderate aortic valve stenosis mean gradient 31 mmHg.  Repeat echo in 1 year. Normal pump function.  Moderate aortic valve stenosis mean gradient 31 mmHg.  Repeat echo in 1 year. Written by Jerline Pain, MD on 12/01/2021 10:04 AM EST

## 2021-12-02 ENCOUNTER — Encounter (INDEPENDENT_AMBULATORY_CARE_PROVIDER_SITE_OTHER): Payer: Medicare HMO | Admitting: Ophthalmology

## 2021-12-02 ENCOUNTER — Other Ambulatory Visit: Payer: Self-pay

## 2021-12-02 DIAGNOSIS — H35033 Hypertensive retinopathy, bilateral: Secondary | ICD-10-CM

## 2021-12-02 DIAGNOSIS — I10 Essential (primary) hypertension: Secondary | ICD-10-CM

## 2021-12-02 DIAGNOSIS — H43813 Vitreous degeneration, bilateral: Secondary | ICD-10-CM

## 2021-12-02 DIAGNOSIS — H353221 Exudative age-related macular degeneration, left eye, with active choroidal neovascularization: Secondary | ICD-10-CM | POA: Diagnosis not present

## 2021-12-02 DIAGNOSIS — H353112 Nonexudative age-related macular degeneration, right eye, intermediate dry stage: Secondary | ICD-10-CM | POA: Diagnosis not present

## 2021-12-03 ENCOUNTER — Ambulatory Visit (HOSPITAL_COMMUNITY)
Admission: RE | Admit: 2021-12-03 | Discharge: 2021-12-03 | Disposition: A | Discharge status: Home / Self Care | Payer: Medicare HMO | Source: Ambulatory Visit | Attending: Internal Medicine | Admitting: Internal Medicine

## 2021-12-03 ENCOUNTER — Encounter: Payer: Self-pay | Admitting: Family Medicine

## 2021-12-03 DIAGNOSIS — M79604 Pain in right leg: Secondary | ICD-10-CM | POA: Diagnosis not present

## 2021-12-03 DIAGNOSIS — M79605 Pain in left leg: Secondary | ICD-10-CM | POA: Insufficient documentation

## 2021-12-10 ENCOUNTER — Ambulatory Visit (INDEPENDENT_AMBULATORY_CARE_PROVIDER_SITE_OTHER): Payer: Medicare HMO | Admitting: Family Medicine

## 2021-12-10 ENCOUNTER — Encounter: Payer: Self-pay | Admitting: Family Medicine

## 2021-12-10 VITALS — BP 122/80 | HR 69 | Temp 98.6°F | Resp 16 | Ht 69.0 in | Wt 237.8 lb

## 2021-12-10 DIAGNOSIS — E1169 Type 2 diabetes mellitus with other specified complication: Secondary | ICD-10-CM

## 2021-12-10 DIAGNOSIS — Z0000 Encounter for general adult medical examination without abnormal findings: Secondary | ICD-10-CM | POA: Diagnosis not present

## 2021-12-10 DIAGNOSIS — E785 Hyperlipidemia, unspecified: Secondary | ICD-10-CM

## 2021-12-10 LAB — CBC WITH DIFFERENTIAL/PLATELET
Basophils Absolute: 0.1 10*3/uL (ref 0.0–0.1)
Basophils Relative: 1.4 % (ref 0.0–3.0)
Eosinophils Absolute: 0.4 10*3/uL (ref 0.0–0.7)
Eosinophils Relative: 6.5 % — ABNORMAL HIGH (ref 0.0–5.0)
HCT: 36.4 % — ABNORMAL LOW (ref 39.0–52.0)
Hemoglobin: 12.2 g/dL — ABNORMAL LOW (ref 13.0–17.0)
Lymphocytes Relative: 22.9 % (ref 12.0–46.0)
Lymphs Abs: 1.4 10*3/uL (ref 0.7–4.0)
MCHC: 33.6 g/dL (ref 30.0–36.0)
MCV: 93.6 fl (ref 78.0–100.0)
Monocytes Absolute: 0.6 10*3/uL (ref 0.1–1.0)
Monocytes Relative: 10.3 % (ref 3.0–12.0)
Neutro Abs: 3.7 10*3/uL (ref 1.4–7.7)
Neutrophils Relative %: 58.9 % (ref 43.0–77.0)
Platelets: 134 10*3/uL — ABNORMAL LOW (ref 150.0–400.0)
RBC: 3.89 Mil/uL — ABNORMAL LOW (ref 4.22–5.81)
RDW: 14.7 % (ref 11.5–15.5)
WBC: 6.2 10*3/uL (ref 4.0–10.5)

## 2021-12-10 LAB — BASIC METABOLIC PANEL
BUN: 17 mg/dL (ref 6–23)
CO2: 26 mEq/L (ref 19–32)
Calcium: 8.8 mg/dL (ref 8.4–10.5)
Chloride: 101 mEq/L (ref 96–112)
Creatinine, Ser: 0.97 mg/dL (ref 0.40–1.50)
GFR: 70.93 mL/min (ref >60.00)
Glucose, Bld: 155 mg/dL — ABNORMAL HIGH (ref 70–99)
Potassium: 4.2 mEq/L (ref 3.5–5.1)
Sodium: 137 mEq/L (ref 135–145)

## 2021-12-10 LAB — HEMOGLOBIN A1C: Hgb A1c MFr Bld: 7.2 % — ABNORMAL HIGH (ref 4.6–6.5)

## 2021-12-10 LAB — LIPID PANEL
Cholesterol: 97 mg/dL (ref 0–200)
HDL: 28.4 mg/dL — ABNORMAL LOW (ref >39.00)
LDL Cholesterol: 37 mg/dL (ref 0–99)
NonHDL: 68.54
Total CHOL/HDL Ratio: 3
Triglycerides: 158 mg/dL — ABNORMAL HIGH (ref 0.0–149.0)
VLDL: 31.6 mg/dL (ref 0.0–40.0)

## 2021-12-10 LAB — TSH: TSH: 4.42 u[IU]/mL (ref 0.35–5.50)

## 2021-12-10 LAB — HEPATIC FUNCTION PANEL
ALT: 17 U/L (ref 0–53)
AST: 21 U/L (ref 0–37)
Albumin: 4.1 g/dL (ref 3.5–5.2)
Alkaline Phosphatase: 50 U/L (ref 39–117)
Bilirubin, Direct: 0.4 mg/dL — ABNORMAL HIGH (ref 0.0–0.3)
Total Bilirubin: 1.4 mg/dL — ABNORMAL HIGH (ref 0.2–1.2)
Total Protein: 6.8 g/dL (ref 6.0–8.3)

## 2021-12-10 NOTE — Patient Instructions (Addendum)
Follow up in 6 months to recheck sugar, BP, and cholesterol ?We'll notify you of your lab results and make any changes if needed ?Continue to work on low carb diet and regular physical activity as you are able ?Call with any questions or concerns ?Stay Safe!  Stay Healthy! ?Happy Belated Birthday!!! ? ?

## 2021-12-10 NOTE — Assessment & Plan Note (Signed)
Chronic problem.  Currently feeling well.  Check labs.  Adjust meds prn. ?

## 2021-12-10 NOTE — Assessment & Plan Note (Signed)
Pt's PE WNL w/ exception of obesity and known Afib/murmur.  UTD on foot exam, eye exam, microalbumin.  Has aged out of colonoscopy.  UTD on immunizations.  Check labs.  Anticipatory guidance provided.  ?

## 2021-12-10 NOTE — Progress Notes (Signed)
? ?  Subjective:  ? ? Patient ID: Marvin Foster, male    DOB: Jul 18, 1934, 88 y.o.   MRN: [id number] ? ?HPI ?CPE- UTD on eye exam, foot exam, Tdap, PNA.  Due for A1C ? ?Patient Care Team  ?  Relationship Specialty Notifications Start End  ?[name], MD PCP - General   09/23/10   ?Jerline Pain, MD PCP - Cardiology Cardiology Admissions 11/01/18   ?Jerline Pain, MD Consulting Physician Cardiology  05/09/14   ?[place]    01/26/17   ?[facility]    01/26/17   ?[facility]th[facility], [place]  Specialist  01/26/17   ?Gardiner Barefoot, DPM Consulting Physician Podiatry  02/24/18   ?Lyndee Hensen, PT Physical Therapist Physical Therapy  07/20/18   ?Edythe Clarity, [facility]  Pharmacist  09/09/21   ? Comment: [phone]  ?  ?Health Maintenance  ?Topic Date Due  ? COVID-19 Vaccine (4 - Booster for Pfizer series) 01/19/2021  ? HEMOGLOBIN A1C  10/28/2021  ? OPHTHALMOLOGY EXAM  12/05/2021  ? FOOT EXAM  05/27/2022  ? TETANUS/TDAP  08/28/2024  ? Pneumonia Vaccine 52+ Years old  Completed  ? INFLUENZA VACCINE  Completed  ? Zoster Vaccines- Shingrix  Completed  ? HPV VACCINES  Aged Out  ?  ? ? ?Review of Systems ?Patient reports no vision/hearing changes, anorexia, fever ,adenopathy, persistant/recurrent hoarseness, swallowing issues, chest pain, palpitations, edema, persistant/recurrent cough, hemoptysis, dyspnea (rest,exertional, paroxysmal nocturnal), gastrointestinal  bleeding (melena, rectal bleeding), abdominal pain, excessive heart burn, GU symptoms (dysuria, hematuria, voiding/incontinence issues) syncope, focal weakness, memory loss, numbness & tingling, skin/hair/nail changes, depression, anxiety, abnormal bruising/bleeding, musculoskeletal symptoms/signs.  ? ?This visit occurred during the SARS-CoV-2 public health emergency.  Safety protocols were in place, including screening questions prior to the visit, additional usage of staff PPE, and extensive cleaning of exam room while observing appropriate contact time as  indicated for disinfecting solutions.   ?   ?Objective:  ? Physical Exam ?General Appearance:    Alert, cooperative, no distress, appears stated age, obese  ?Head:    Normocephalic, without obvious abnormality, atraumatic  ?Eyes:    PERRL, conjunctiva/corneas clear, EOM's intact, fundi  ?  benign, both eyes       ?Ears:    Hearing aids in place bilaterally  ?Nose:   Nares normal, septum midline, mucosa normal, no drainage ?  or sinus tenderness  ?Throat:   Lips, mucosa, and tongue normal; teeth and gums normal  ?Neck:   Supple, symmetrical, trachea midline, no adenopathy;     ?  thyroid:  No enlargement/tenderness/nodules  ?Back:     Symmetric, no curvature, ROM normal, no CVA tenderness  ?Lungs:     Clear to auscultation bilaterally, respirations unlabored  ?Chest wall:    No tenderness or deformity  ?Heart:    Normal rate and irregular rhythm, S1 and S2 normal, II/VI SEM  ?Abdomen:     Soft, non-tender, obese.  bowel sounds active all four quadrants, no masses, no organomegaly  ?Genitalia:    deferred  ?Rectal:    ?Extremities:   Extremities normal, atraumatic, no cyanosis or edema  ?Pulses:   2+ and symmetric all extremities  ?Skin:   Skin color, texture, turgor normal, no rashes or lesions  ?Lymph nodes:   Cervical, supraclavicular, and axillary nodes normal  ?Neurologic:   CNII-XII intact. Normal strength, sensation and reflexes    ?  throughout  ?  ? ? ? ?   ?Assessment & Plan:  ? ? ?

## 2021-12-17 ENCOUNTER — Other Ambulatory Visit: Payer: Self-pay | Admitting: Family Medicine

## 2021-12-30 ENCOUNTER — Ambulatory Visit (INDEPENDENT_AMBULATORY_CARE_PROVIDER_SITE_OTHER): Payer: Medicare HMO

## 2021-12-30 ENCOUNTER — Other Ambulatory Visit: Payer: Self-pay

## 2021-12-30 DIAGNOSIS — Z5181 Encounter for therapeutic drug level monitoring: Secondary | ICD-10-CM | POA: Diagnosis not present

## 2021-12-30 DIAGNOSIS — I4891 Unspecified atrial fibrillation: Secondary | ICD-10-CM

## 2021-12-30 LAB — POCT INR: INR: 2.7 (ref 2.0–3.0)

## 2021-12-30 NOTE — Patient Instructions (Signed)
Description   ?*DOES NOT NEED A PRINTOUT*  ?Continue taking 1 tablet daily except for 1.5 tablets on Wednesdays. Repeat INR in 6 weeks. Call us with any medication changes or concerns # [phone] Coumadin Clinic, Main # [phone].  ?  ?   ?

## 2022-01-03 ENCOUNTER — Other Ambulatory Visit: Payer: Self-pay | Admitting: Family Medicine

## 2022-01-06 ENCOUNTER — Encounter (INDEPENDENT_AMBULATORY_CARE_PROVIDER_SITE_OTHER): Payer: Medicare HMO | Admitting: Ophthalmology

## 2022-01-06 DIAGNOSIS — H43813 Vitreous degeneration, bilateral: Secondary | ICD-10-CM

## 2022-01-06 DIAGNOSIS — I10 Essential (primary) hypertension: Secondary | ICD-10-CM

## 2022-01-06 DIAGNOSIS — H353112 Nonexudative age-related macular degeneration, right eye, intermediate dry stage: Secondary | ICD-10-CM | POA: Diagnosis not present

## 2022-01-06 DIAGNOSIS — H353221 Exudative age-related macular degeneration, left eye, with active choroidal neovascularization: Secondary | ICD-10-CM

## 2022-01-06 DIAGNOSIS — H35033 Hypertensive retinopathy, bilateral: Secondary | ICD-10-CM

## 2022-01-12 DIAGNOSIS — H18513 Endothelial corneal dystrophy, bilateral: Secondary | ICD-10-CM | POA: Diagnosis not present

## 2022-01-12 DIAGNOSIS — H353221 Exudative age-related macular degeneration, left eye, with active choroidal neovascularization: Secondary | ICD-10-CM | POA: Diagnosis not present

## 2022-01-12 DIAGNOSIS — H35371 Puckering of macula, right eye: Secondary | ICD-10-CM | POA: Diagnosis not present

## 2022-01-12 LAB — HM DIABETES EYE EXAM

## 2022-01-15 ENCOUNTER — Encounter: Payer: Self-pay | Admitting: Family Medicine

## 2022-01-19 ENCOUNTER — Other Ambulatory Visit: Payer: Self-pay | Admitting: Cardiology

## 2022-01-26 ENCOUNTER — Encounter: Payer: Self-pay | Admitting: Family Medicine

## 2022-02-08 DIAGNOSIS — M1712 Unilateral primary osteoarthritis, left knee: Secondary | ICD-10-CM | POA: Diagnosis not present

## 2022-02-08 DIAGNOSIS — M545 Low back pain, unspecified: Secondary | ICD-10-CM | POA: Diagnosis not present

## 2022-02-10 ENCOUNTER — Ambulatory Visit (INDEPENDENT_AMBULATORY_CARE_PROVIDER_SITE_OTHER): Payer: Medicare HMO

## 2022-02-10 ENCOUNTER — Encounter (INDEPENDENT_AMBULATORY_CARE_PROVIDER_SITE_OTHER): Payer: Medicare HMO | Admitting: Ophthalmology

## 2022-02-10 DIAGNOSIS — H353112 Nonexudative age-related macular degeneration, right eye, intermediate dry stage: Secondary | ICD-10-CM | POA: Diagnosis not present

## 2022-02-10 DIAGNOSIS — I10 Essential (primary) hypertension: Secondary | ICD-10-CM

## 2022-02-10 DIAGNOSIS — H353221 Exudative age-related macular degeneration, left eye, with active choroidal neovascularization: Secondary | ICD-10-CM

## 2022-02-10 DIAGNOSIS — H43812 Vitreous degeneration, left eye: Secondary | ICD-10-CM | POA: Diagnosis not present

## 2022-02-10 DIAGNOSIS — Z5181 Encounter for therapeutic drug level monitoring: Secondary | ICD-10-CM | POA: Diagnosis not present

## 2022-02-10 DIAGNOSIS — H35033 Hypertensive retinopathy, bilateral: Secondary | ICD-10-CM | POA: Diagnosis not present

## 2022-02-10 DIAGNOSIS — I4891 Unspecified atrial fibrillation: Secondary | ICD-10-CM

## 2022-02-10 LAB — POCT INR: INR: 3.8 — AB (ref 2.0–3.0)

## 2022-02-10 NOTE — Patient Instructions (Signed)
Description   ?*DOES NOT NEED A PRINTOUT*  ?Only take 0.5 tablet today and then continue taking 1 tablet daily except for 1.5 tablets on Wednesdays.  ?Repeat INR in 4 weeks.  ?Call us with any medication changes or concerns # [phone] Coumadin Clinic, Main # [phone].  ?  ?   ?

## 2022-02-17 DIAGNOSIS — M25562 Pain in left knee: Secondary | ICD-10-CM | POA: Diagnosis not present

## 2022-02-19 DIAGNOSIS — H401123 Primary open-angle glaucoma, left eye, severe stage: Secondary | ICD-10-CM | POA: Diagnosis not present

## 2022-02-19 DIAGNOSIS — H401112 Primary open-angle glaucoma, right eye, moderate stage: Secondary | ICD-10-CM | POA: Diagnosis not present

## 2022-02-19 DIAGNOSIS — H35371 Puckering of macula, right eye: Secondary | ICD-10-CM | POA: Diagnosis not present

## 2022-02-19 DIAGNOSIS — H18513 Endothelial corneal dystrophy, bilateral: Secondary | ICD-10-CM | POA: Diagnosis not present

## 2022-02-19 DIAGNOSIS — H353221 Exudative age-related macular degeneration, left eye, with active choroidal neovascularization: Secondary | ICD-10-CM | POA: Diagnosis not present

## 2022-02-22 DIAGNOSIS — M25562 Pain in left knee: Secondary | ICD-10-CM | POA: Diagnosis not present

## 2022-02-25 DIAGNOSIS — M25562 Pain in left knee: Secondary | ICD-10-CM | POA: Diagnosis not present

## 2022-02-27 ENCOUNTER — Other Ambulatory Visit: Payer: Self-pay | Admitting: Family Medicine

## 2022-02-27 DIAGNOSIS — E1169 Type 2 diabetes mellitus with other specified complication: Secondary | ICD-10-CM

## 2022-03-01 DIAGNOSIS — M25562 Pain in left knee: Secondary | ICD-10-CM | POA: Diagnosis not present

## 2022-03-03 DIAGNOSIS — M25562 Pain in left knee: Secondary | ICD-10-CM | POA: Diagnosis not present

## 2022-03-06 ENCOUNTER — Other Ambulatory Visit: Payer: Self-pay | Admitting: Cardiology

## 2022-03-09 NOTE — Telephone Encounter (Signed)
Received refill request for warfarin:  Last INR was 3.8 on 02/10/22 Next INR due 03/10/22 LOV was 11/11/21  Marvin Barrow MD  Refill approved

## 2022-03-10 ENCOUNTER — Telehealth: Payer: Medicare HMO

## 2022-03-10 ENCOUNTER — Ambulatory Visit: Payer: Medicare HMO

## 2022-03-10 DIAGNOSIS — I4891 Unspecified atrial fibrillation: Secondary | ICD-10-CM

## 2022-03-10 DIAGNOSIS — Z5181 Encounter for therapeutic drug level monitoring: Secondary | ICD-10-CM

## 2022-03-10 LAB — POCT INR: INR: 3.2 — AB (ref 2.0–3.0)

## 2022-03-10 NOTE — Patient Instructions (Signed)
Description   *DOES NOT NEED A PRINTOUT*  START taking 1 tablet daily.  Repeat INR in 3 weeks.  Call us with any medication changes or concerns # [phone] Coumadin Clinic, Main # [phone].

## 2022-03-11 DIAGNOSIS — M25562 Pain in left knee: Secondary | ICD-10-CM | POA: Diagnosis not present

## 2022-03-17 ENCOUNTER — Encounter (INDEPENDENT_AMBULATORY_CARE_PROVIDER_SITE_OTHER): Payer: Medicare HMO | Admitting: Ophthalmology

## 2022-03-17 ENCOUNTER — Encounter (INDEPENDENT_AMBULATORY_CARE_PROVIDER_SITE_OTHER): Payer: Self-pay

## 2022-03-18 ENCOUNTER — Encounter (INDEPENDENT_AMBULATORY_CARE_PROVIDER_SITE_OTHER): Payer: Self-pay

## 2022-03-18 ENCOUNTER — Encounter (INDEPENDENT_AMBULATORY_CARE_PROVIDER_SITE_OTHER): Payer: Medicare HMO | Admitting: Ophthalmology

## 2022-03-18 DIAGNOSIS — M25562 Pain in left knee: Secondary | ICD-10-CM | POA: Diagnosis not present

## 2022-03-22 ENCOUNTER — Encounter (INDEPENDENT_AMBULATORY_CARE_PROVIDER_SITE_OTHER): Payer: Medicare HMO | Admitting: Ophthalmology

## 2022-03-22 DIAGNOSIS — H353221 Exudative age-related macular degeneration, left eye, with active choroidal neovascularization: Secondary | ICD-10-CM | POA: Diagnosis not present

## 2022-03-22 DIAGNOSIS — H43813 Vitreous degeneration, bilateral: Secondary | ICD-10-CM

## 2022-03-22 DIAGNOSIS — H35033 Hypertensive retinopathy, bilateral: Secondary | ICD-10-CM

## 2022-03-22 DIAGNOSIS — Z0100 Encounter for examination of eyes and vision without abnormal findings: Secondary | ICD-10-CM | POA: Diagnosis not present

## 2022-03-22 DIAGNOSIS — H353112 Nonexudative age-related macular degeneration, right eye, intermediate dry stage: Secondary | ICD-10-CM

## 2022-03-22 DIAGNOSIS — I10 Essential (primary) hypertension: Secondary | ICD-10-CM

## 2022-03-23 DIAGNOSIS — M25562 Pain in left knee: Secondary | ICD-10-CM | POA: Diagnosis not present

## 2022-03-24 ENCOUNTER — Other Ambulatory Visit: Payer: Self-pay | Admitting: Family Medicine

## 2022-03-25 DIAGNOSIS — M25562 Pain in left knee: Secondary | ICD-10-CM | POA: Diagnosis not present

## 2022-03-29 DIAGNOSIS — M25562 Pain in left knee: Secondary | ICD-10-CM | POA: Diagnosis not present

## 2022-03-31 ENCOUNTER — Ambulatory Visit: Payer: Medicare HMO

## 2022-03-31 DIAGNOSIS — I4891 Unspecified atrial fibrillation: Secondary | ICD-10-CM

## 2022-03-31 DIAGNOSIS — Z5181 Encounter for therapeutic drug level monitoring: Secondary | ICD-10-CM | POA: Diagnosis not present

## 2022-03-31 DIAGNOSIS — M25562 Pain in left knee: Secondary | ICD-10-CM | POA: Diagnosis not present

## 2022-03-31 LAB — POCT INR: INR: 2.3 (ref 2.0–3.0)

## 2022-03-31 NOTE — Patient Instructions (Signed)
Description   *DOES NOT NEED A PRINTOUT*  Continue taking 1 tablet daily.  Repeat INR in 4 weeks.  Call us with any medication changes or concerns # [phone] Coumadin Clinic, Main # [phone].

## 2022-04-01 ENCOUNTER — Other Ambulatory Visit: Payer: Self-pay | Admitting: Family Medicine

## 2022-04-05 DIAGNOSIS — M25562 Pain in left knee: Secondary | ICD-10-CM | POA: Diagnosis not present

## 2022-04-07 DIAGNOSIS — M25562 Pain in left knee: Secondary | ICD-10-CM | POA: Diagnosis not present

## 2022-04-14 DIAGNOSIS — M25562 Pain in left knee: Secondary | ICD-10-CM | POA: Diagnosis not present

## 2022-04-15 ENCOUNTER — Encounter: Payer: Self-pay | Admitting: Family Medicine

## 2022-04-19 DIAGNOSIS — M25562 Pain in left knee: Secondary | ICD-10-CM | POA: Diagnosis not present

## 2022-04-21 ENCOUNTER — Ambulatory Visit: Payer: Medicare HMO | Admitting: Family Medicine

## 2022-04-21 DIAGNOSIS — M25562 Pain in left knee: Secondary | ICD-10-CM | POA: Diagnosis not present

## 2022-04-27 ENCOUNTER — Other Ambulatory Visit: Payer: Self-pay | Admitting: Family Medicine

## 2022-04-28 ENCOUNTER — Ambulatory Visit (INDEPENDENT_AMBULATORY_CARE_PROVIDER_SITE_OTHER): Payer: Medicare HMO | Admitting: *Deleted

## 2022-04-28 DIAGNOSIS — I4891 Unspecified atrial fibrillation: Secondary | ICD-10-CM | POA: Diagnosis not present

## 2022-04-28 DIAGNOSIS — Z5181 Encounter for therapeutic drug level monitoring: Secondary | ICD-10-CM | POA: Diagnosis not present

## 2022-04-28 LAB — POCT INR: INR: 2.6 (ref 2.0–3.0)

## 2022-04-28 NOTE — Patient Instructions (Addendum)
Description   *DOES NOT NEED A PRINTOUT*  Continue taking 1 tablet daily. Repeat INR in 5 weeks.  Call us with any medication changes or concerns # [phone] or [phone] Coumadin Clinic, Main # [phone].

## 2022-05-03 ENCOUNTER — Encounter (INDEPENDENT_AMBULATORY_CARE_PROVIDER_SITE_OTHER): Payer: Medicare HMO | Admitting: Ophthalmology

## 2022-05-03 DIAGNOSIS — H35032 Hypertensive retinopathy, left eye: Secondary | ICD-10-CM

## 2022-05-03 DIAGNOSIS — I10 Essential (primary) hypertension: Secondary | ICD-10-CM | POA: Diagnosis not present

## 2022-05-03 DIAGNOSIS — H353221 Exudative age-related macular degeneration, left eye, with active choroidal neovascularization: Secondary | ICD-10-CM

## 2022-05-03 DIAGNOSIS — H43812 Vitreous degeneration, left eye: Secondary | ICD-10-CM | POA: Diagnosis not present

## 2022-05-04 ENCOUNTER — Other Ambulatory Visit: Payer: Self-pay | Admitting: Family Medicine

## 2022-05-04 ENCOUNTER — Other Ambulatory Visit: Payer: Self-pay | Admitting: Cardiology

## 2022-05-16 ENCOUNTER — Other Ambulatory Visit: Payer: Self-pay | Admitting: Family Medicine

## 2022-05-18 ENCOUNTER — Telehealth: Payer: Self-pay | Admitting: Family Medicine

## 2022-05-18 NOTE — Telephone Encounter (Signed)
Needs an appointment for referral

## 2022-05-18 NOTE — Telephone Encounter (Signed)
Caller name: [place] (pt)  On DPR? :yes/no: Yes  Call back number:  Provider they see: Dr. Birdie Riddle  Reason for call: Calling about left knee edema; would like a referral to PT in [place]. Please advise.

## 2022-05-19 ENCOUNTER — Other Ambulatory Visit: Payer: Self-pay | Admitting: Family Medicine

## 2022-05-24 ENCOUNTER — Other Ambulatory Visit: Payer: Self-pay | Admitting: Family Medicine

## 2022-05-24 DIAGNOSIS — R600 Localized edema: Secondary | ICD-10-CM | POA: Diagnosis not present

## 2022-05-24 DIAGNOSIS — M1712 Unilateral primary osteoarthritis, left knee: Secondary | ICD-10-CM | POA: Diagnosis not present

## 2022-05-24 DIAGNOSIS — M545 Low back pain, unspecified: Secondary | ICD-10-CM | POA: Diagnosis not present

## 2022-05-24 DIAGNOSIS — E1169 Type 2 diabetes mellitus with other specified complication: Secondary | ICD-10-CM

## 2022-05-24 NOTE — Telephone Encounter (Signed)
error 

## 2022-05-26 ENCOUNTER — Telehealth: Payer: Self-pay | Admitting: Family Medicine

## 2022-05-26 NOTE — Telephone Encounter (Signed)
Pt wants to know if he needs to fast for visit on Friday 05/28/22 b/c he wants to combine visits [phone]

## 2022-05-26 NOTE — Telephone Encounter (Signed)
Informed pt that he does not have to fast for his apt

## 2022-05-28 ENCOUNTER — Encounter: Payer: Self-pay | Admitting: Family Medicine

## 2022-05-28 ENCOUNTER — Ambulatory Visit (INDEPENDENT_AMBULATORY_CARE_PROVIDER_SITE_OTHER): Payer: Medicare HMO | Admitting: Family Medicine

## 2022-05-28 VITALS — BP 118/60 | HR 80 | Temp 99.0°F | Resp 20 | Ht 69.0 in | Wt 229.8 lb

## 2022-05-28 DIAGNOSIS — I10 Essential (primary) hypertension: Secondary | ICD-10-CM

## 2022-05-28 DIAGNOSIS — E1139 Type 2 diabetes mellitus with other diabetic ophthalmic complication: Secondary | ICD-10-CM

## 2022-05-28 DIAGNOSIS — E785 Hyperlipidemia, unspecified: Secondary | ICD-10-CM

## 2022-05-28 DIAGNOSIS — I878 Other specified disorders of veins: Secondary | ICD-10-CM | POA: Diagnosis not present

## 2022-05-28 DIAGNOSIS — E1169 Type 2 diabetes mellitus with other specified complication: Secondary | ICD-10-CM

## 2022-05-28 MED ORDER — TRIAMCINOLONE ACETONIDE 0.1 % EX OINT: 1.0000 | TOPICAL_OINTMENT | Freq: Two times a day (BID) | CUTANEOUS | 1 refills | Status: AC

## 2022-05-28 MED ORDER — FUROSEMIDE 20 MG PO TABS: 20.0000 mg | ORAL_TABLET | Freq: Every day | ORAL | 3 refills | Status: DC

## 2022-05-28 NOTE — Assessment & Plan Note (Signed)
Chronic problem.  Currently on Lipitor 40mg daily w/o difficulty.  Check labs.  Adjust meds prn  

## 2022-05-28 NOTE — Assessment & Plan Note (Signed)
Chronic problem.  Currently well controlled on Amlodipine 2.'5mg'$  daily and Metoprolol '100mg'$  BID.  Currently asymptomatic w/ exception of bilateral LE edema.  No med changes at this time.  Will continue to follow.

## 2022-05-28 NOTE — Progress Notes (Signed)
   Subjective:    Patient ID: Marvin Foster, male    DOB: 29-Oct-1933, 88 y.o.   MRN: [id number]  HPI DM- chronic problem, on Metformin '1000mg'$  BID.  UTD on eye exam, microalbumin.  Denies symptomatic lows.  + chronic numbness of feet.  HTN- chronic problem, on Amlodipine 2.'5mg'$  daily, metoprolol '100mg'$  BID w/ good control.  No CP, SOB, HAs.  Hyperlipidemia- chronic problem, on Lipitor '40mg'$  daily.  No abd pain, N/V.  LE edema- pt reports bilateral LE edema.  Has skin changes bilaterally.  Ortho won't do surgery on knee b/c he's worried that he is at too high a risk for infxn.  Pt has seen Dr Marlou Porch and his blood flow in both legs is normal.  ECHO done in Feb w/o evidence of CHF.   Review of Systems For ROS see HPI     Objective:   Physical Exam Vitals reviewed.  Constitutional:      General: He is not in acute distress.    Appearance: Normal appearance. He is well-developed. He is obese. He is not ill-appearing.  HENT:     Head: Normocephalic and atraumatic.  Eyes:     Extraocular Movements: Extraocular movements intact.     Conjunctiva/sclera: Conjunctivae normal.     Pupils: Pupils are equal, round, and reactive to light.  Neck:     Thyroid: No thyromegaly.  Cardiovascular:     Rate and Rhythm: Normal rate and regular rhythm.     Pulses: Normal pulses.     Heart sounds: Normal heart sounds. No murmur heard. Pulmonary:     Effort: Pulmonary effort is normal. No respiratory distress.     Breath sounds: Normal breath sounds.  Abdominal:     General: Bowel sounds are normal. There is no distension.     Palpations: Abdomen is soft.  Musculoskeletal:     Cervical back: Normal range of motion and neck supple.     Right lower leg: Edema (1+ pitting edema) present.     Left lower leg: Edema (1+ pitting edema) present.  Lymphadenopathy:     Cervical: No cervical adenopathy.  Skin:    General: Skin is warm and dry.     Comments: Thickened, hyperpigmented skin of lower legs  bilaterally w/o evidence of infection  Neurological:     General: No focal deficit present.     Mental Status: He is alert and oriented to person, place, and time.     Cranial Nerves: No cranial nerve deficit.  Psychiatric:        Mood and Affect: Mood normal.        Behavior: Behavior normal.           Assessment & Plan:

## 2022-05-28 NOTE — Assessment & Plan Note (Signed)
Chronic problem.  On Metformin '1000mg'$  BID w/ hx of good control.  UTD on eye exam, microalbumin.  Has chronic neuropathy- has to schedule w/ podiatry.  No symptomatic lows.  Check labs.  Adjust meds prn

## 2022-05-28 NOTE — Patient Instructions (Signed)
Schedule your complete physical in 6 months We'll notify you of your lab results and make any changes if needed START the Furosemide (Lasix- water pill) once daily Apply the Triamcinolone ointment twice daily to help w/ the thickness and itching of the skin Call with any questions or concerns Stay Safe!  Stay Healthy!

## 2022-05-28 NOTE — Assessment & Plan Note (Signed)
New.  Pt has 1+ pitting edema bilateral w/ hyperpigmentation and thickened skin.  Reviewed vascular studies of lower legs- WNL.  Looked at [facility] which is due to be repeated in Feb.  No evidence of CHF.  Start Lasix to improve swelling and start topical triamcinolone ointment BID to soften the thickened skin and help w/ itching.  Pt expressed understanding and is in agreement w/ plan.

## 2022-05-29 LAB — MICROALBUMIN / CREATININE URINE RATIO
Creatinine, Urine: 59 mg/dL (ref 20–320)
Microalb Creat Ratio: 78 mcg/mg creat — ABNORMAL HIGH (ref <30)
Microalb, Ur: 4.6 mg/dL

## 2022-05-29 LAB — BASIC METABOLIC PANEL
BUN: 18 mg/dL (ref 7–25)
CO2: 24 mmol/L (ref 20–32)
Calcium: 9.3 mg/dL (ref 8.6–10.3)
Chloride: 104 mmol/L (ref 98–110)
Creat: 1.06 mg/dL (ref 0.70–1.22)
Glucose, Bld: 116 mg/dL — ABNORMAL HIGH (ref 65–99)
Potassium: 5.1 mmol/L (ref 3.5–5.3)
Sodium: 140 mmol/L (ref 135–146)

## 2022-05-29 LAB — HEPATIC FUNCTION PANEL
AG Ratio: 1.5 (calc) (ref 1.0–2.5)
ALT: 17 U/L (ref 9–46)
AST: 26 U/L (ref 10–35)
Albumin: 4.1 g/dL (ref 3.6–5.1)
Alkaline phosphatase (APISO): 61 U/L (ref 35–144)
Bilirubin, Direct: 0.4 mg/dL — ABNORMAL HIGH (ref 0.0–0.2)
Globulin: 2.8 g/dL (calc) (ref 1.9–3.7)
Indirect Bilirubin: 0.7 mg/dL (calc) (ref 0.2–1.2)
Total Bilirubin: 1.1 mg/dL (ref 0.2–1.2)
Total Protein: 6.9 g/dL (ref 6.1–8.1)

## 2022-05-29 LAB — CBC WITH DIFFERENTIAL/PLATELET
Absolute Monocytes: 690 cells/uL (ref 200–950)
Basophils Absolute: 40 cells/uL (ref 0–200)
Basophils Relative: 0.7 %
Eosinophils Absolute: 331 cells/uL (ref 15–500)
Eosinophils Relative: 5.8 %
HCT: 33.6 % — ABNORMAL LOW (ref 38.5–50.0)
Hemoglobin: 10.7 g/dL — ABNORMAL LOW (ref 13.2–17.1)
Lymphs Abs: 1351 cells/uL (ref 850–3900)
MCH: 29.3 pg (ref 27.0–33.0)
MCHC: 31.8 g/dL — ABNORMAL LOW (ref 32.0–36.0)
MCV: 92.1 fL (ref 80.0–100.0)
MPV: 11.8 fL (ref 7.5–12.5)
Monocytes Relative: 12.1 %
Neutro Abs: 3289 cells/uL (ref 1500–7800)
Neutrophils Relative %: 57.7 %
Platelets: 150 10*3/uL (ref 140–400)
RBC: 3.65 10*6/uL — ABNORMAL LOW (ref 4.20–5.80)
RDW: 15.2 % — ABNORMAL HIGH (ref 11.0–15.0)
Total Lymphocyte: 23.7 %
WBC: 5.7 10*3/uL (ref 3.8–10.8)

## 2022-05-29 LAB — LIPID PANEL
Cholesterol: 97 mg/dL (ref <200)
HDL: 30 mg/dL — ABNORMAL LOW (ref >=40)
LDL Cholesterol (Calc): 47 mg/dL (calc)
Non-HDL Cholesterol (Calc): 67 mg/dL (calc) (ref <130)
Total CHOL/HDL Ratio: 3.2 (calc) (ref <5.0)
Triglycerides: 115 mg/dL (ref <150)

## 2022-05-29 LAB — HEMOGLOBIN A1C
Hgb A1c MFr Bld: 6.5 % of total Hgb — ABNORMAL HIGH (ref <5.7)
Mean Plasma Glucose: 140 mg/dL
eAG (mmol/L): 7.7 mmol/L

## 2022-05-29 LAB — TSH: TSH: 2.43 mIU/L (ref 0.40–4.50)

## 2022-05-30 ENCOUNTER — Other Ambulatory Visit: Payer: Self-pay | Admitting: Family Medicine

## 2022-06-01 NOTE — Progress Notes (Signed)
Informed pt of labs results . IFOB sent to pt

## 2022-06-02 ENCOUNTER — Ambulatory Visit: Payer: Medicare HMO | Admitting: *Deleted

## 2022-06-02 DIAGNOSIS — Z5181 Encounter for therapeutic drug level monitoring: Secondary | ICD-10-CM

## 2022-06-02 DIAGNOSIS — I4891 Unspecified atrial fibrillation: Secondary | ICD-10-CM

## 2022-06-02 LAB — POCT INR: INR: 2 (ref 2.0–3.0)

## 2022-06-02 NOTE — Patient Instructions (Signed)
Description   *DOES NOT NEED A PRINTOUT*  Continue taking 1 tablet daily. Repeat INR in 6 weeks.  Call us with any medication changes or concerns # [phone] or [phone] [facility], Main # [phone].

## 2022-06-04 ENCOUNTER — Telehealth: Payer: Self-pay | Admitting: Family Medicine

## 2022-06-04 NOTE — Telephone Encounter (Signed)
Pt is needing another fecal sample kit sent to him, [place] [id number]

## 2022-06-04 NOTE — Telephone Encounter (Signed)
Mailed to pt . Placed address to lab inside and instructed to mail sample to the lab

## 2022-06-05 ENCOUNTER — Other Ambulatory Visit: Payer: Self-pay | Admitting: Family Medicine

## 2022-06-07 ENCOUNTER — Encounter (INDEPENDENT_AMBULATORY_CARE_PROVIDER_SITE_OTHER): Payer: Medicare HMO | Admitting: Ophthalmology

## 2022-06-07 DIAGNOSIS — I10 Essential (primary) hypertension: Secondary | ICD-10-CM | POA: Diagnosis not present

## 2022-06-07 DIAGNOSIS — H353221 Exudative age-related macular degeneration, left eye, with active choroidal neovascularization: Secondary | ICD-10-CM

## 2022-06-07 DIAGNOSIS — H35032 Hypertensive retinopathy, left eye: Secondary | ICD-10-CM | POA: Diagnosis not present

## 2022-06-07 DIAGNOSIS — H43812 Vitreous degeneration, left eye: Secondary | ICD-10-CM

## 2022-06-16 ENCOUNTER — Ambulatory Visit: Payer: Medicare HMO | Admitting: Family Medicine

## 2022-06-28 ENCOUNTER — Ambulatory Visit: Payer: Medicare HMO | Admitting: Podiatry

## 2022-06-28 ENCOUNTER — Encounter: Payer: Self-pay | Admitting: Podiatry

## 2022-06-28 DIAGNOSIS — E119 Type 2 diabetes mellitus without complications: Secondary | ICD-10-CM

## 2022-06-28 DIAGNOSIS — B351 Tinea unguium: Secondary | ICD-10-CM | POA: Diagnosis not present

## 2022-06-28 DIAGNOSIS — D689 Coagulation defect, unspecified: Secondary | ICD-10-CM

## 2022-06-28 DIAGNOSIS — M79675 Pain in left toe(s): Secondary | ICD-10-CM

## 2022-06-28 NOTE — Progress Notes (Signed)
This patient returns to my office for at risk foot care.  This patient requires this care by a professional since this patient will be at risk due to having coagulation defect and diabetes.   Patient is taking coumadin.  This patient is unable to cut nails himself since the patient cannot reach his nails.These nails are painful walking and wearing shoes.  He presents for his annual diabetic exam. This patient presents for at risk foot care today.  Patient has not been seen in over 13 months  General Appearance  Alert, conversant and in no acute stress.  Vascular  Dorsalis pedis  pulses are  not palpable  Bilaterally due to swelling.[name] Kitchen Posterior tibial pulses are weakly palpable due to swelling from venous stasis.  Capillary return is within normal limits  bilaterally. Temperature is within normal limits  Bilaterally. Severe swelling and venous stasis lower legs  B/L.  Neurologic  Senn-Weinstein monofilament wire test within normal limits  bilaterally. Muscle power within normal limits bilaterally.  Nails Thick disfigured discolored nails with subungual debris  2,3 left foot. No evidence of bacterial infection or drainage bilaterally.  Orthopedic  No limitations of motion  feet .  No crepitus or effusions noted. HAV  B/L.  Skin  normotropic skin with no porokeratosis noted bilaterally.  No signs of infections or ulcers noted.     Onychomycosis    Pain in left toes  Diabetes with vascular pathology only.  Consent was obtained for treatment procedures.   Mechanical debridement of nails 1-5  bilaterally performed with a nail nipper.  Filed with dremel without incident.  Diabetic foot exam was performed   Return office visit    4 months                 Told patient to return for periodic foot care and evaluation due to potential at risk complications.   Gardiner Barefoot DPM

## 2022-07-01 ENCOUNTER — Other Ambulatory Visit (INDEPENDENT_AMBULATORY_CARE_PROVIDER_SITE_OTHER): Payer: Medicare HMO

## 2022-07-01 ENCOUNTER — Encounter: Payer: Self-pay | Admitting: Physician Assistant

## 2022-07-01 ENCOUNTER — Telehealth: Payer: Self-pay

## 2022-07-01 ENCOUNTER — Other Ambulatory Visit: Payer: Self-pay

## 2022-07-01 DIAGNOSIS — E119 Type 2 diabetes mellitus without complications: Secondary | ICD-10-CM

## 2022-07-01 LAB — FECAL OCCULT BLOOD, IMMUNOCHEMICAL: Fecal Occult Bld: POSITIVE — AB

## 2022-07-01 NOTE — Telephone Encounter (Signed)
Will also route this note to Dr Hilarie Fredrickson for him to be aware.  + iFOB in setting of decreasing hgb

## 2022-07-01 NOTE — Telephone Encounter (Signed)
Pt needs to call Dr Hilarie Fredrickson [facility]) and schedule an appt as soon as possible for evaluation of GI blood loss.  Please let him know that it will be faster for him to call since he's an established patient and they are very backed up in their referrals

## 2022-07-01 NOTE — Addendum Note (Signed)
Addended by: Ruben Im on: 07/01/2022 07:39 AM   Modules accepted: Orders

## 2022-07-01 NOTE — Telephone Encounter (Signed)
Called 07/01/2022 at 2:22 and spoke to patient and informed him that he should call [facility] and make and appointment as soon as possiblke

## 2022-07-01 NOTE — Telephone Encounter (Signed)
Critical lab call about patients iFOB as it did come back positive.

## 2022-07-05 ENCOUNTER — Other Ambulatory Visit: Payer: Self-pay | Admitting: Family Medicine

## 2022-07-05 DIAGNOSIS — M1712 Unilateral primary osteoarthritis, left knee: Secondary | ICD-10-CM | POA: Diagnosis not present

## 2022-07-05 DIAGNOSIS — M5451 Vertebrogenic low back pain: Secondary | ICD-10-CM | POA: Diagnosis not present

## 2022-07-05 DIAGNOSIS — R600 Localized edema: Secondary | ICD-10-CM | POA: Diagnosis not present

## 2022-07-05 DIAGNOSIS — M545 Low back pain, unspecified: Secondary | ICD-10-CM | POA: Diagnosis not present

## 2022-07-05 NOTE — Telephone Encounter (Signed)
Patient is already scheduled for an appt with Ellouise Newer, PA-C on Monday, 08/02/22 at 3 pm.

## 2022-07-05 NOTE — Telephone Encounter (Signed)
Marvin Foster,  Pt needs APP visit for anemia and heme + stools Thanks JMP

## 2022-07-12 ENCOUNTER — Telehealth: Payer: Self-pay | Admitting: Family Medicine

## 2022-07-12 ENCOUNTER — Other Ambulatory Visit: Payer: Self-pay

## 2022-07-12 ENCOUNTER — Encounter (INDEPENDENT_AMBULATORY_CARE_PROVIDER_SITE_OTHER): Payer: Medicare HMO | Admitting: Ophthalmology

## 2022-07-12 DIAGNOSIS — Z23 Encounter for immunization: Secondary | ICD-10-CM | POA: Diagnosis not present

## 2022-07-12 DIAGNOSIS — I10 Essential (primary) hypertension: Secondary | ICD-10-CM

## 2022-07-12 DIAGNOSIS — R350 Frequency of micturition: Secondary | ICD-10-CM | POA: Diagnosis not present

## 2022-07-12 DIAGNOSIS — H353112 Nonexudative age-related macular degeneration, right eye, intermediate dry stage: Secondary | ICD-10-CM

## 2022-07-12 DIAGNOSIS — H43813 Vitreous degeneration, bilateral: Secondary | ICD-10-CM | POA: Diagnosis not present

## 2022-07-12 DIAGNOSIS — H35371 Puckering of macula, right eye: Secondary | ICD-10-CM | POA: Diagnosis not present

## 2022-07-12 DIAGNOSIS — H353221 Exudative age-related macular degeneration, left eye, with active choroidal neovascularization: Secondary | ICD-10-CM | POA: Diagnosis not present

## 2022-07-12 DIAGNOSIS — H35033 Hypertensive retinopathy, bilateral: Secondary | ICD-10-CM | POA: Diagnosis not present

## 2022-07-12 MED ORDER — TAMSULOSIN HCL 0.4 MG PO CAPS: 0.4000 mg | ORAL_CAPSULE | Freq: Every day | ORAL | 0 refills | Status: DC

## 2022-07-12 NOTE — Telephone Encounter (Signed)
Caller name: Aniello  On DPR? :yes/no: Yes  Call back number: [phone]  Provider they see: Birdie Riddle  Reason for call: pt calling b/c refill of flomax was denied. Wants to know why it was declined; has been off of it for a week and had to go to urgent care this morning due to very frequent and painful urination.

## 2022-07-12 NOTE — Telephone Encounter (Signed)
Sent refill, called to inform pt no answer LM

## 2022-07-14 ENCOUNTER — Ambulatory Visit: Payer: Medicare HMO | Attending: Cardiology

## 2022-07-14 ENCOUNTER — Ambulatory Visit: Payer: Medicare HMO

## 2022-07-14 DIAGNOSIS — I4891 Unspecified atrial fibrillation: Secondary | ICD-10-CM

## 2022-07-14 DIAGNOSIS — Z5181 Encounter for therapeutic drug level monitoring: Secondary | ICD-10-CM

## 2022-07-14 LAB — POCT INR: INR: 2.9 (ref 2.0–3.0)

## 2022-07-14 NOTE — Patient Instructions (Signed)
Description   *DOES NOT NEED A PRINTOUT*  Continue taking 1 tablet daily. Repeat INR in 7 weeks.  Call us with any medication changes or concerns # [phone] or [phone] Cou[facility], Main # [phone].

## 2022-07-21 ENCOUNTER — Other Ambulatory Visit: Payer: Self-pay | Admitting: Family Medicine

## 2022-08-02 ENCOUNTER — Encounter: Payer: Self-pay | Admitting: Physician Assistant

## 2022-08-02 ENCOUNTER — Telehealth: Payer: Self-pay

## 2022-08-02 ENCOUNTER — Ambulatory Visit: Payer: Medicare HMO | Admitting: Physician Assistant

## 2022-08-02 ENCOUNTER — Other Ambulatory Visit (INDEPENDENT_AMBULATORY_CARE_PROVIDER_SITE_OTHER): Payer: Medicare HMO

## 2022-08-02 VITALS — BP 122/74 | HR 77 | Ht 69.0 in | Wt 218.0 lb

## 2022-08-02 DIAGNOSIS — Z7901 Long term (current) use of anticoagulants: Secondary | ICD-10-CM

## 2022-08-02 DIAGNOSIS — R634 Abnormal weight loss: Secondary | ICD-10-CM

## 2022-08-02 DIAGNOSIS — R195 Other fecal abnormalities: Secondary | ICD-10-CM

## 2022-08-02 DIAGNOSIS — R194 Change in bowel habit: Secondary | ICD-10-CM

## 2022-08-02 DIAGNOSIS — D508 Other iron deficiency anemias: Secondary | ICD-10-CM | POA: Diagnosis not present

## 2022-08-02 DIAGNOSIS — I350 Nonrheumatic aortic (valve) stenosis: Secondary | ICD-10-CM | POA: Diagnosis not present

## 2022-08-02 LAB — CBC WITH DIFFERENTIAL/PLATELET
Basophils Absolute: 0.1 10*3/uL (ref 0.0–0.1)
Basophils Relative: 1 % (ref 0.0–3.0)
Eosinophils Absolute: 0.3 10*3/uL (ref 0.0–0.7)
Eosinophils Relative: 5.3 % — ABNORMAL HIGH (ref 0.0–5.0)
HCT: 36.7 % — ABNORMAL LOW (ref 39.0–52.0)
Hemoglobin: 12.2 g/dL — ABNORMAL LOW (ref 13.0–17.0)
Lymphocytes Relative: 27.8 % (ref 12.0–46.0)
Lymphs Abs: 1.7 10*3/uL (ref 0.7–4.0)
MCHC: 33.1 g/dL (ref 30.0–36.0)
MCV: 88.5 fl (ref 78.0–100.0)
Monocytes Absolute: 0.8 10*3/uL (ref 0.1–1.0)
Monocytes Relative: 12.4 % — ABNORMAL HIGH (ref 3.0–12.0)
Neutro Abs: 3.3 10*3/uL (ref 1.4–7.7)
Neutrophils Relative %: 53.5 % (ref 43.0–77.0)
Platelets: 158 10*3/uL (ref 150.0–400.0)
RBC: 4.15 Mil/uL — ABNORMAL LOW (ref 4.22–5.81)
RDW: 16.1 % — ABNORMAL HIGH (ref 11.5–15.5)
WBC: 6.1 10*3/uL (ref 4.0–10.5)

## 2022-08-02 LAB — IBC + FERRITIN
Ferritin: 29.8 ng/mL (ref 22.0–322.0)
Iron: 109 ug/dL (ref 42–165)
Saturation Ratios: 24.3 % (ref 20.0–50.0)
TIBC: 448 ug/dL (ref 250.0–450.0)
Transferrin: 320 mg/dL (ref 212.0–360.0)

## 2022-08-02 MED ORDER — NA SULFATE-K SULFATE-MG SULF 17.5-3.13-1.6 GM/177ML PO SOLN: 1.0000 | ORAL | 0 refills | Status: DC

## 2022-08-02 NOTE — Progress Notes (Signed)
Chief Complaint: Anemia and positive Hemoccult  HPI:    Marvin Foster is an 88 year old male with a past medical history as listed below including CAD on Coumadin, moderate aortic stenosis, and multiple others, known to Dr. Hilarie Fredrickson, who was referred to me by [name], MD for a complaint of anemia with heme positive stool.      11/16/2018 colonoscopy for personal history of adenomatous polyps with 2 to 3-4 mm polyps in the descending and transverse colon, diverticulosis in the sigmoid and descending colon internal hemorrhoids.    11/26/2021 echo with LVEF 60-65%, moderate to severe aortic valve stenosis, aortic valve area by VTI measures 1.05 cm, aortic valve mean gradient measures 31 point 0 aortic valve V-max 3.31.    05/28/2022 CBC with a hemoglobin of 10.7 (12.2 on 12/10/2021).  Hepatic function panel normal.    07/01/2022 fecal occult blood positive.    07/14/2022 INR 2.9.    Today, patient presents to clinic and tells me is not really sure why he is here.  He is aware of the finding of anemia and Hemoccult positive stools but was not really sure what that meant.  Explains that he has always been on iron for anemia for many years.  Tells me that he has noticed a weight loss of around 17 pounds since he was seen with his PCP on 8/18 and he has not really been trying.  In fact he was just alerted to this when stepping on the scale here.  Also tells me that he still seems to have a change in bowel habits over that same timeframe switching back and forth from some constipation/straining to diarrhea that is very urgent.  He did buy a stool softener but was not exactly sure how to use this.  Denies any new medications or diet changes though he does feel like maybe the diarrhea is diet related.    Continues on Coumadin.    Denies fever, chills, seeing blood in his stool, abdominal pain, nausea, vomiting, heartburn, reflux, shortness of breath, palpitations or chest pain.  Past Medical History:   Diagnosis Date   Acute respiratory failure due to COVID-19 [facility]) 07/07/2020   ANEMIA 02/18/2010   Qualifier: Diagnosis of  By: Birdie Riddle MD, Belenda Cruise     Arthritis 12/2005   left hand   Arthritis    Left knee   ASPARTATE AMINOTRANSFERASE, SERUM, ELEVATED 11/04/2008   Qualifier: Diagnosis of  By: Birdie Riddle MD, Katherine     Atrial fibrillation ([place]) 07/24/2013   Blood transfusion without reported diagnosis    Bursitis of knee    right knee   CAD (coronary artery disease)    Cardiac arrhythmia due to congenital heart disease    Chronic anticoagulation 06/12/2015   Coagulation defect ([place]) 05/28/2020   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   CORONARY ARTERY DISEASE 03/27/2007   Qualifier: Diagnosis of  By: Cletus Gash MD, [place]     Diabetes mellitus    Diverticulosis    Glaucoma    left eye   Hx of colonic polyps 06/12/2015   Hyperlipidemia    Hypertension    Hypothyroid 09/21/2018   Long term current use of anticoagulant therapy 07/24/2013   Neuropathy    with pain   Pain due to onychomycosis of toenail of left foot 05/28/2020   Permanent atrial fibrillation (HCC)    RBBB    Scrotal mass 03/07/2012   Spinal stenosis 01/20/2005   Tubular adenoma of colon  Past Surgical History:  Procedure Laterality Date   BACK SURGERY     CATARACT EXTRACTION  08/11/09   right   corneal endothelial Left 2013   CORNEAL TRANSPLANT Left    eye   CORONARY ANGIOPLASTY WITH STENT PLACEMENT     EYE SURGERY     SMALL INTESTINE SURGERY     SPINE SURGERY     VASECTOMY      Current Outpatient Medications  Medication Sig Dispense Refill   amLODipine (NORVASC) 2.5 MG tablet Take 1 tablet by mouth daily. 90 tablet 1   atorvastatin (LIPITOR) 40 MG tablet TAKE 1 TABLET BY MOUTH AT BEDTIME 90 tablet 0   Calcium Carbonate-Vitamin D (CALCIUM 600 + D PO) Take 1 tablet by mouth 2 (two) times daily.     diclofenac Sodium (VOLTAREN) 1 % GEL Apply 2 g topically 4 (four)  times daily. 100 g 1   fluorometholone (FML) 0.1 % ophthalmic suspension Place 1 drop into both eyes every 30 (thirty) days.      furosemide (LASIX) 20 MG tablet Take 1 tablet (20 mg total) by mouth daily. 30 tablet 3   Lancets (ONETOUCH ULTRASOFT) lancets Use as instructed 100 each 12   latanoprost (XALATAN) 0.005 % ophthalmic solution Place 1 drop into both eyes at bedtime.      levothyroxine (SYNTHROID) 75 MCG tablet Take 1 tablet by mouth once daily 90 tablet 0   magnesium oxide (MAGOX 400) 400 (241.3 Mg) MG tablet Take 1 tablet (400 mg total) by mouth 2 (two) times daily. 60 tablet 11   metFORMIN (GLUCOPHAGE) 1000 MG tablet Take 1 tablet by mouth twice daily 180 tablet 0   metoprolol tartrate (LOPRESSOR) 100 MG tablet Take 1 tablet by mouth twice daily 180 tablet 0   Multiple Vitamin (MULTIVITAMIN) capsule Take 1 capsule by mouth daily.     Omega-3 Fatty Acids (FISH OIL) 1200 MG CAPS Take 3 capsules by mouth daily.     ONETOUCH VERIO test strip USE 1 STRIP TO CHECK GLUCOSE TWICE DAILY AS DIRECTED 100 each 0   tamsulosin (FLOMAX) 0.4 MG CAPS capsule Take 1 capsule (0.4 mg total) by mouth daily. 90 capsule 0   timolol (TIMOPTIC) 0.5 % ophthalmic solution Place 1 drop into both eyes daily.      triamcinolone ointment (KENALOG) 0.1 % Apply 1 Application topically 2 (two) times daily. 90 g 1   warfarin (COUMADIN) 2.5 MG tablet TAKE 1 TO 1 & 1/2 (ONE TO ONE & ONE-HALF) TABLETS BY MOUTH ONCE DAILY AS  DIRECTED  BY  THE  COUMADIN  CLINIC 120 tablet 1   No current facility-administered medications for this visit.    Allergies as of 08/02/2022 - Review Complete 08/02/2022  Allergen Reaction Noted   Neomycin-bacitracin zn-polymyx Rash    Penicillins Other (See Comments) and Rash 05/21/2020   Enalapril  11/27/2020   Miconazole nitrate Nausea Only 06/28/2022   Sulfa antibiotics  12/10/2020   Other Rash 05/21/2020    Family History  Problem Relation Age of Onset   Heart failure Mother     Heart failure Father    Stomach cancer Neg Hx    Colon cancer Neg Hx    Pancreatic cancer Neg Hx    Throat cancer Neg Hx     Social History   Socioeconomic History   Marital status: Divorced    Spouse name: Not on file   Number of children: Not on file   Years of education: Not on file  Highest education level: Not on file  Occupational History   Occupation: retired  Tobacco Use   Smoking status: Never   Smokeless tobacco: Never  Vaping Use   Vaping Use: Never used  Substance and Sexual Activity   Alcohol use: Not Currently    Comment: Very seldom---socially   Drug use: No   Sexual activity: Not Currently    Partners: Female  Other Topics Concern   Not on file  Social History Narrative   Volunteers at [employer] Strain: Low Risk  (08/13/2021)   Overall Financial Resource Strain (CARDIA)    Difficulty of Paying Living Expenses: Not hard at all  Food Insecurity: No Food Insecurity (08/13/2021)   Hunger Vital Sign    Worried About Running Out of Food in the Last Year: Never true    [place] in the Last Year: Never true  Transportation Needs: No Transportation Needs (08/13/2021)   PRAPARE - Hydrologist (Medical): No    Lack of Transportation (Non-Medical): No  Physical Activity: Inactive (08/13/2021)   Exercise Vital Sign    Days of Exercise per Week: 0 days    Minutes of Exercise per Session: 0 min  Stress: No Stress Concern Present (08/13/2021)   [place]    Feeling of Stress : Not at all  Social Connections: Moderately Isolated (08/13/2021)   Social Connection and Isolation Panel [NHANES]    Frequency of Communication with Friends and Family: More than three times a week    Frequency of Social Gatherings with Friends and Family: More than three times a week    Attends Religious Services: Never     Marine scientist or Organizations: Yes    Attends Music therapist: More than 4 times per year    Marital Status: Divorced  Intimate Partner Violence: Not At Risk (08/13/2021)   Humiliation, Afraid, Rape, and Kick questionnaire    Fear of Current or Ex-Partner: No    Emotionally Abused: No    Physically Abused: No    Sexually Abused: No    Review of Systems:    Constitutional: No weight loss, fever or chills Skin: No rash Cardiovascular: No chest pain Respiratory: No SOB Gastrointestinal: See HPI and otherwise negative Genitourinary: No dysuria Neurological: No headache, dizziness or syncope Musculoskeletal: No new muscle or joint pain Hematologic: No bleeding Psychiatric: No history of depression or anxiety   Physical Exam:  Vital signs: BP 122/74   Pulse 77   Ht '5\' 9"'$  (1.753 m)   Wt 218 lb (98.9 kg)   BMI 32.19 kg/m    Constitutional:   Pleasant obese Caucasian male appears to be in NAD, Well developed, Well nourished, alert and cooperative Head:  Normocephalic and atraumatic. Eyes:   PEERL, EOMI. No icterus. Conjunctiva pink. Ears:  Normal auditory acuity. Neck:  Supple Throat: Oral cavity and pharynx without inflammation, swelling or lesion.  Respiratory: Respirations even and unlabored. Lungs clear to auscultation bilaterally.   No wheezes, crackles, or rhonchi.  Cardiovascular: Normal S1, S2. +murmur. Regular rate and rhythm. No peripheral edema, cyanosis or pallor.  Gastrointestinal:  Soft, nondistended, nontender. No rebound or guarding. Normal bowel sounds. No appreciable masses or hepatomegaly. Rectal:  Not performed.  Msk:  Symmetrical without gross deformities. Without edema, no deformity or joint abnormality.  Neurologic:  Alert and  oriented x4;  grossly normal neurologically.  Skin:   Dry and intact without significant lesions or rashes. Psychiatric: Demonstrates good judgement and reason without abnormal affect or behaviors.  RELEVANT  LABS AND IMAGING: CBC    Component Value Date/Time   WBC 5.7 05/28/2022 1402   RBC 3.65 (L) 05/28/2022 1402   HGB 10.7 (L) 05/28/2022 1402   HGB 12.7 (L) 11/13/2020 1108   HCT 33.6 (L) 05/28/2022 1402   HCT 38.8 11/13/2020 1108   PLT 150 05/28/2022 1402   PLT 139 (L) 11/13/2020 1108   MCV 92.1 05/28/2022 1402   MCV 93 11/13/2020 1108   MCH 29.3 05/28/2022 1402   MCHC 31.8 (L) 05/28/2022 1402   RDW 15.2 (H) 05/28/2022 1402   RDW 13.1 11/13/2020 1108   LYMPHSABS 1,351 05/28/2022 1402   MONOABS 0.6 12/10/2021 0920   EOSABS 331 05/28/2022 1402   BASOSABS 40 05/28/2022 1402    CMP     Component Value Date/Time   NA 140 05/28/2022 1402   NA 142 11/14/2020 1233   K 5.1 05/28/2022 1402   CL 104 05/28/2022 1402   CO2 24 05/28/2022 1402   GLUCOSE 116 (H) 05/28/2022 1402   BUN 18 05/28/2022 1402   BUN 16 11/14/2020 1233   CREATININE 1.06 05/28/2022 1402   CALCIUM 9.3 05/28/2022 1402   PROT 6.9 05/28/2022 1402   ALBUMIN 4.1 12/10/2021 0920   AST 26 05/28/2022 1402   ALT 17 05/28/2022 1402   ALKPHOS 50 12/10/2021 0920   BILITOT 1.1 05/28/2022 1402   GFRNONAA 73 11/14/2020 1233   GFRAA 85 11/14/2020 1233    Assessment: 1.  Iron deficiency anemia: Acute on chronic with Hemoccult positive stools now, last colonoscopy in February 2020 with a polyp and otherwise normal, also reporting almost a 20 pound weight loss over the past 2 months without trying no change in bowel habits; consider GI cancer versus other source of blood loss 2.  Weight loss: 18 pounds in the past 2 months without trying 3.  Change in bowel habits: Radiating back-and-forth from diarrhea to constipation over the past 2 months 4.  Chronic anticoagulation for CAD: On Coumadin 5.  Moderate to severe aortic stenosis: AVA 1.05 cm on last echo in February  Plan: 1.  Due to patient's severe aortic stenosis he needs to have his EGD and colonoscopy at the hospital for iron deficiency anemia with weight loss and a  change in bowel habits and Hemoccult positive stool.  Patient was scheduled with Dr. Fuller Plan as he had sooner availability than Dr. Hilarie Fredrickson at the hospital.  Did communicate with both physicians about this.  This was scheduled for early December. 2.  Recheck CBC and a iron studies today. 3.  Patient will need to hold his Coumadin for 5-7 days prior to time of procedures.  We will communicate with the Coumadin clinic in regards to this to make sure it is acceptable for him. 4.  Recommend the patient start a fiber supplement such as Metamucil, Citrucel or Benefiber to help with his variance in stools, if this is unhelpful and procedures are relatively normal then would recommend follow-up in clinic to discuss this further. 5.  Patient to follow in clinic per recommendations after procedures above.  Ellouise Newer, PA-C [facility] 08/02/2022, 2:53 PM  Cc: [name], MD

## 2022-08-02 NOTE — Patient Instructions (Addendum)
_______________________________________________________  If you are age 86 or older, your body mass index should be between 23-30. Your Body mass index is 32.19 kg/m. If this is out of the aforementioned range listed, please consider follow up with your Primary Care Provider. ________________________________________________________  The [facility] providers would like to encourage you to use [facility] to communicate with providers for non-urgent requests or questions.  Due to long hold times on the telephone, sending your provider a message by [facility] may be a faster and more efficient way to get a response.  Please allow 48 business hours for a response.  Please remember that this is for non-urgent requests.  _______________________________________________________  Your provider has requested that you go to the basement level for lab work before leaving today. Press "B" on the elevator. The lab is located at the first door on the left as you exit the elevator.  You have been scheduled for an endoscopy and colonoscopy. Please follow the written instructions given to you at your visit today. Please pick up your prep supplies at the pharmacy within the next 1-3 days. If you use inhalers (even only as needed), please bring them with you on the day of your procedure.  Due to recent changes in healthcare laws, you may see the results of your imaging and laboratory studies on MyChart before your provider has had a chance to review them.  We understand that in some cases there may be results that are confusing or concerning to you. Not all laboratory results come back in the same time frame and the provider may be waiting for multiple results in order to interpret others.  Please give [place] 48 hours in order for your provider to thoroughly review all the results before contacting the office for clarification of your results.   Thank you for entrusting me with your care and choosing [facility].  Ellouise Newer, PA-C

## 2022-08-02 NOTE — Addendum Note (Signed)
Addended by: Yevette Edwards on: 08/02/2022 03:43 PM   Modules accepted: Orders

## 2022-08-02 NOTE — Telephone Encounter (Signed)
[facility] Pre-operative Risk Assessment     Request for surgical clearance:     Endoscopy Procedure  What type of surgery is being performed?     Endoscopy/colonoscopy  When is this surgery scheduled?     09-16-22  What type of clearance is required ?   Pharmacy  Are there any medications that need to be held prior to surgery and how long? Coumadin/Warfarin 5 to 7 days  Practice name and name of physician performing surgery?      [facility]  What is your office phone and fax number?      Phone- [phone]  [phone]  Anesthesia type (None, local, MAC, general) ?       MAC

## 2022-08-03 NOTE — Telephone Encounter (Signed)
Patient with diagnosis of atrial fibrillation on warfarin for anticoagulation.    Procedure: endoscopy/colonoscopy Date of procedure: 09/16/22   CHA2DS2-VASc Score = 5   This indicates a 7.2% annual risk of stroke. The patient's score is based upon: CHF History: 0 HTN History: 1 Diabetes History: 1 Stroke History: 0 Vascular Disease History: 1 Age Score: 2 Gender Score: 0    CrCl 70 Platelet count 158  Per office protocol, patient can hold warfarin for 5 days prior to procedure.   Patient will not need bridging with Lovenox (enoxaparin) around procedure.  [facility]R followed by [facility] cou[facility] clinic - they are aware to hold w/o bridge  **This guidance is not considered finalized until pre-operative APP has relayed final recommendations.**

## 2022-08-03 NOTE — Telephone Encounter (Signed)
   Patient Name: Marvin Foster  DOB: June 27, 1934 MRN: [id number]  Primary Cardiologist: Candee Furbish, MD  Chart reviewed as part of pre-operative protocol coverage.  Pharmacy clearance only requested. Marvin Foster is maintained on Warfarin. Per office protocol and pharmacy review, may hold Warfarin 5 days prior to planned procedure. Will not need bridging with Lovenox.  Sent via MyChart to make patient aware of these recommendations.   I will route this recommendation to the requesting party via Epic fax function and remove from pre-op pool.  Please call with questions.  Loel Dubonnet, NP 08/03/2022, 12:27 PM

## 2022-08-04 NOTE — Telephone Encounter (Signed)
Left message for patient to return call to further discuss Warfarin hold prior to endo/colon.  Will continue efforts.

## 2022-08-04 NOTE — Telephone Encounter (Signed)
Patient advised that he has been given clearance to hold Coumadin 5 days prior to [facility] scheduled for 09-16-22.  Patient advised to take last dose of Coumadin on 09-10-22, and he will be advised when to restart Coumadin by Dr Fuller Plan after the procedure.  Patient agreed to plan and verbalized understanding.  No further questions.

## 2022-08-06 ENCOUNTER — Telehealth: Payer: Self-pay | Admitting: Family Medicine

## 2022-08-06 NOTE — Telephone Encounter (Signed)
Left message for patient to call back and schedule Medicare Annual Wellness Visit (AWV) in office.   If not able to come in office, please offer to do virtually or by telephone.  Left office number and my jabber [phone].  Last AWV:08/13/2021  Please schedule at anytime with Nurse Health Advisor.

## 2022-08-15 ENCOUNTER — Other Ambulatory Visit: Payer: Self-pay | Admitting: Family Medicine

## 2022-08-16 ENCOUNTER — Encounter (INDEPENDENT_AMBULATORY_CARE_PROVIDER_SITE_OTHER): Payer: Medicare HMO | Admitting: Ophthalmology

## 2022-08-16 DIAGNOSIS — H353221 Exudative age-related macular degeneration, left eye, with active choroidal neovascularization: Secondary | ICD-10-CM

## 2022-08-16 DIAGNOSIS — I10 Essential (primary) hypertension: Secondary | ICD-10-CM

## 2022-08-16 DIAGNOSIS — H43812 Vitreous degeneration, left eye: Secondary | ICD-10-CM

## 2022-08-16 DIAGNOSIS — H35032 Hypertensive retinopathy, left eye: Secondary | ICD-10-CM

## 2022-08-20 NOTE — Progress Notes (Signed)
Addendum: Reviewed and agree with assessment and management plan. Malita Ignasiak M, MD  

## 2022-08-25 ENCOUNTER — Ambulatory Visit (INDEPENDENT_AMBULATORY_CARE_PROVIDER_SITE_OTHER): Payer: Medicare HMO | Admitting: Family Medicine

## 2022-08-25 ENCOUNTER — Encounter: Payer: Self-pay | Admitting: Family Medicine

## 2022-08-25 ENCOUNTER — Telehealth: Payer: Self-pay | Admitting: Cardiology

## 2022-08-25 VITALS — BP 130/68 | HR 68 | Temp 98.5°F | Resp 18 | Ht 69.0 in | Wt 220.5 lb

## 2022-08-25 DIAGNOSIS — R0982 Postnasal drip: Secondary | ICD-10-CM

## 2022-08-25 MED ORDER — CETIRIZINE HCL 10 MG PO TABS: 10.0000 mg | ORAL_TABLET | Freq: Every day | ORAL | 11 refills | Status: DC

## 2022-08-25 NOTE — Telephone Encounter (Signed)
Do not see it has been followed this year when seen in clinic but prior levels were quite low so would continue unless he otherwise is not tolerating it. Thanks!

## 2022-08-25 NOTE — Patient Instructions (Signed)
Follow up as needed or as scheduled START daily Cetirizine (Zyrtec) to help decrease postnasal drip and morning sputum Drink plenty of water to rinse the phlegm off the back of the throat Call with any questions or concerns Hang in there! Happy Holidays!!!

## 2022-08-25 NOTE — Progress Notes (Signed)
   Subjective:    Patient ID: Marvin Foster, male    DOB: [birth year], 88 y.o.   MRN: [id number]  HPI Cough- pt reports he has had several episodes of morning phlegm that he needs to clear.  Sputum yesterday was blood tinged.  Today was normal.  No fevers.  No SOB.  Denies sinus pain/pressure.  Denies PND.  Not currently on allergy medication.  Pt reports feeling 'ok' and not feeling sick.   Review of Systems For ROS see HPI     Objective:   Physical Exam Vitals reviewed.  Constitutional:      General: He is not in acute distress.    Appearance: Normal appearance. He is well-developed. He is not ill-appearing.  HENT:     Head: Normocephalic and atraumatic.     Nose: Congestion present. No rhinorrhea.     Mouth/Throat:     Mouth: Mucous membranes are moist.     Pharynx: Posterior oropharyngeal erythema (copious PND) present. No oropharyngeal exudate.  Eyes:     Conjunctiva/sclera: Conjunctivae normal.     Pupils: Pupils are equal, round, and reactive to light.  Cardiovascular:     Rate and Rhythm: Normal rate and regular rhythm.     Heart sounds: Normal heart sounds.  Pulmonary:     Effort: Pulmonary effort is normal. No respiratory distress.     Breath sounds: Normal breath sounds. No wheezing.  Musculoskeletal:     Cervical back: Normal range of motion and neck supple.  Lymphadenopathy:     Cervical: No cervical adenopathy.  Skin:    General: Skin is warm and dry.  Neurological:     Mental Status: He is alert.           Assessment & Plan:   PND- new.  Pt has copious PND which is likely causing his phlegm and AM cough.  Start Cetirizine daily.  Reviewed supportive care and red flags that should prompt return.  Pt expressed understanding and is in agreement w/ plan.

## 2022-08-25 NOTE — Telephone Encounter (Signed)
  Charlie Pitter, PA-C 11/24/2020  6:03 AM EST     Please let pt know magnesium level is improving. Continue MagOx '400mg'$  BID. Please increase dietary intake of healthy sources of magnesium including leafy greens, nuts, seeds, fish, beans, whole grains, avocados, yogurt, and bananas. Given that he tends to have a baseline low magnesium level without any obvious cause would suggest f/u with primary care for this. (Not on any cardiac meds that would cause this.)     The above information is from previous documentation at office visit.  Pt reports he did discuss with Dr Virgil Benedict office regarding the need for him to take a Magnesium supplement.  Dr Virgil Benedict office advised pt since Eugenia Mcalpine start this medication, she will need to follow and determine if pt still needs it.  Advised pt I will send to her for review.

## 2022-08-25 NOTE — Telephone Encounter (Signed)
Pt is aware  of Dayna's recommendation.  All questions, if any were answered at the time of the call

## 2022-08-25 NOTE — Telephone Encounter (Signed)
Pt c/o medication issue:  1. Name of Medication:   magnesium oxide (MAGOX 400) 400 (241.3 Mg) MG tablet    2. How are you currently taking this medication (dosage and times per day)? Take 1 tablet (400 mg total) by mouth 2 (two) times daily. - Oral   3. Are you having a reaction (difficulty breathing--STAT)?   4. What is your medication issue? Pt states this was increased to '400mg'$  by D. Dunn,PA. He states [facility] has been out of the dosage for a while, however his main concern is that he googled the maximum amount of mag intake for males is '427mg'$  but he has been taking '800mg'$  a day. Pt would like some clarification, please advise.

## 2022-08-26 ENCOUNTER — Ambulatory Visit (INDEPENDENT_AMBULATORY_CARE_PROVIDER_SITE_OTHER): Payer: Medicare HMO | Admitting: *Deleted

## 2022-08-26 DIAGNOSIS — Z0000 Encounter for general adult medical examination without abnormal findings: Secondary | ICD-10-CM | POA: Diagnosis not present

## 2022-08-26 NOTE — Progress Notes (Signed)
Subjective:   Marvin Foster is a 88 y.o. male who presents for Medicare Annual/Subsequent preventive examination.  I connected with  Marvin Foster on 08/26/22 by a telephone enabled telemedicine application and verified that I am speaking with the correct person using two identifiers.   I discussed the limitations of evaluation and management by telemedicine. The patient expressed understanding and agreed to proceed.  Patient location: home  Provider location: Tele-health-home    Review of Systems     Cardiac Risk Factors include: advanced age (>79mn, >>60women);diabetes mellitus;male gender;hypertension;obesity (BMI >30kg/m2);sedentary lifestyle     Objective:    Today's Vitals   There is no height or weight on file to calculate BMI.     08/26/2022    8:46 AM 08/13/2021    9:59 AM 08/11/2020    9:05 AM 08/01/2019    8:58 AM 07/13/2018    9:44 AM 02/24/2018    9:09 AM 01/26/2017   12:57 PM  Advanced Directives  Does Patient Have a Medical Advance Directive? No No No Yes No No No  Type of AScientist, research (medical)Living will     Copy of [place] Chart?    No - copy requested     Would patient like information on creating a medical advance directive? No - Patient declined No - Patient declined Yes (MAU/Ambulatory/Procedural Areas - Information given)  No - Patient declined Yes (MAU/Ambulatory/Procedural Areas - Information given) Yes (MAU/Ambulatory/Procedural Areas - Information given)    Current Medications (verified) Outpatient Encounter Medications as of 08/26/2022  Medication Sig   amLODipine (NORVASC) 2.5 MG tablet Take 1 tablet by mouth daily.   atorvastatin (LIPITOR) 40 MG tablet TAKE 1 TABLET BY MOUTH AT BEDTIME   Calcium Carbonate-Vitamin D (CALCIUM 600 + D PO) Take 1 tablet by mouth 2 (two) times daily.   cetirizine (ZYRTEC) 10 MG tablet Take 1 tablet (10 mg total) by mouth daily.   diclofenac Sodium  (VOLTAREN) 1 % GEL Apply 2 g topically 4 (four) times daily.   fluorometholone (FML) 0.1 % ophthalmic suspension Place 1 drop into both eyes every 30 (thirty) days.    furosemide (LASIX) 20 MG tablet Take 1 tablet (20 mg total) by mouth daily.   Lancets (ONETOUCH ULTRASOFT) lancets Use as instructed   latanoprost (XALATAN) 0.005 % ophthalmic solution Place 1 drop into both eyes at bedtime.    levothyroxine (SYNTHROID) 75 MCG tablet Take 1 tablet by mouth once daily   magnesium oxide (MAGOX 400) 400 (241.3 Mg) MG tablet Take 1 tablet (400 mg total) by mouth 2 (two) times daily.   metFORMIN (GLUCOPHAGE) 1000 MG tablet Take 1 tablet by mouth twice daily   metoprolol tartrate (LOPRESSOR) 100 MG tablet Take 1 tablet by mouth twice daily   Multiple Vitamin (MULTIVITAMIN) capsule Take 1 capsule by mouth daily.   Na Sulfate-K Sulfate-Mg Sulf (SUPREP BOWEL PREP KIT) 17.5-3.13-1.6 GM/177ML SOLN Take 1 kit by mouth as directed.   Omega-3 Fatty Acids (FISH OIL) 1200 MG CAPS Take 3 capsules by mouth daily.   ONETOUCH VERIO test strip USE 1 STRIP TO CHECK GLUCOSE TWICE DAILY AS DIRECTED   tamsulosin (FLOMAX) 0.4 MG CAPS capsule Take 1 capsule (0.4 mg total) by mouth daily.   timolol (TIMOPTIC) 0.5 % ophthalmic solution Place 1 drop into both eyes daily.    triamcinolone ointment (KENALOG) 0.1 % Apply 1 Application topically 2 (two) times daily.   warfarin (COUMADIN)  2.5 MG tablet TAKE 1 TO 1 & 1/2 (ONE TO ONE & ONE-HALF) TABLETS BY MOUTH ONCE DAILY AS  DIRECTED  BY  THE  COUMADIN  CLINIC   No facility-administered encounter medications on file as of 08/26/2022.    Allergies (verified) Neomycin-bacitracin zn-polymyx, Penicillins, Enalapril, Miconazole nitrate, Sulfa antibiotics, and Other   History: Past Medical History:  Diagnosis Date   Acute respiratory failure due to COVID-19 ([place]) 07/07/2020   ANEMIA 02/18/2010   Qualifier: Diagnosis of  By: Birdie Riddle MD, Belenda Cruise     Arthritis 12/2005   left hand    Arthritis    Left knee   ASPARTATE AMINOTRANSFERASE, SERUM, ELEVATED 11/04/2008   Qualifier: Diagnosis of  By: Birdie Riddle MD, Katherine     Atrial fibrillation ([place]) 07/24/2013   Blood transfusion without reported diagnosis    Bursitis of knee    right knee   CAD (coronary artery disease)    Cardiac arrhythmia due to congenital heart disease    Chronic anticoagulation 06/12/2015   Coagulation defect ([place]) 05/28/2020   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   CORONARY ARTERY DISEASE 03/27/2007   Qualifier: Diagnosis of  By: Cletus Gash MD, [place]     Diabetes mellitus    Diverticulosis    Glaucoma    left eye   Hx of colonic polyps 06/12/2015   Hyperlipidemia    Hypertension    Hypothyroid 09/21/2018   Long term current use of anticoagulant therapy 07/24/2013   Neuropathy    with pain   Pain due to onychomycosis of toenail of left foot 05/28/2020   Permanent atrial fibrillation (HCC)    RBBB    Scrotal mass 03/07/2012   Spinal stenosis 01/20/2005   Tubular adenoma of colon    Past Surgical History:  Procedure Laterality Date   BACK SURGERY     CATARACT EXTRACTION  08/11/09   right   corneal endothelial Left 2013   CORNEAL TRANSPLANT Left    eye   CORONARY ANGIOPLASTY WITH STENT PLACEMENT     EYE SURGERY     SMALL INTESTINE SURGERY     SPINE SURGERY     VASECTOMY     Family History  Problem Relation Age of Onset   Heart failure Mother    Heart failure Father    Stomach cancer Neg Hx    Colon cancer Neg Hx    Pancreatic cancer Neg Hx    Throat cancer Neg Hx    Social History   Socioeconomic History   Marital status: Divorced    Spouse name: Not on file   Number of children: Not on file   Years of education: Not on file   Highest education level: Not on file  Occupational History   Occupation: retired   Occupation: retired  Tobacco Use   Smoking status: Never   Smokeless tobacco: Never  Scientific laboratory technician Use: Never used   Substance and Sexual Activity   Alcohol use: Not Currently    Comment: Very seldom---socially   Drug use: No   Sexual activity: Not Currently    Partners: Female  Other Topics Concern   Not on file  Social History Narrative   Volunteers at [employer] Determinants of Health   Financial Resource Strain: Low Risk  (08/26/2022)   Overall Financial Resource Strain (CARDIA)    Difficulty of Paying Living Expenses: Not hard at all  Food Insecurity: No  Food Insecurity (08/26/2022)   Hunger Vital Sign    Worried About Running Out of Food in the Last Year: Never true    Ran Out of Food in the Last Year: Never true  Transportation Needs: No Transportation Needs (08/26/2022)   PRAPARE - Hydrologist (Medical): No    Lack of Transportation (Non-Medical): No  Physical Activity: Inactive (08/26/2022)   Exercise Vital Sign    Days of Exercise per Week: 0 days    Minutes of Exercise per Session: 0 min  Stress: No Stress Concern Present (08/26/2022)   [place]    Feeling of Stress : Not at all  Social Connections: Moderately Isolated (08/26/2022)   Social Connection and Isolation Panel [NHANES]    Frequency of Communication with Friends and Family: More than three times a week    Frequency of Social Gatherings with Friends and Family: Three times a week    Attends Religious Services: Never    Active Member of Clubs or Organizations: Yes    Attends Music therapist: More than 4 times per year    Marital Status: Divorced    Tobacco Counseling Counseling given: Not Answered   Clinical Intake:  Pre-visit preparation completed: Yes  Pain : No/denies pain     Diabetes: Yes CBG done?: No Did pt. bring in CBG monitor from home?: No  How often do you need to have someone help you when you read instructions, pamphlets, or other written materials from your  doctor or pharmacy?: 1 - Never  Diabetic?   Yes  Nutrition Risk Assessment:  Has the patient had any N/V/D within the last 2 months?  No  Does the patient have any non-healing wounds?  No  Has the patient had any unintentional weight loss or weight gain?  No   Diabetes:  Is the patient diabetic?  Yes  If diabetic, was a CBG obtained today?  No  Did the patient bring in their glucometer from home?  No  How often do you monitor your CBG's? 1 x a day.   Financial Strains and Diabetes Management:  Are you having any financial strains with the device, your supplies or your medication? No .  Does the patient want to be seen by Chronic Care Management for management of their diabetes?  No  Would the patient like to be referred to a Nutritionist or for Diabetic Management?  No   Diabetic Exams:  Diabetic Eye Exam: Completed   Pt has been advised about the importance in completing this exam. A referral has been placed today.   Diabetic Foot Exam: Completed . Pt has been advised about the importance in completing this exam.    Interpreter Needed?: No  Information entered by :: Leroy Kennedy LPN   Activities of Daily Living    08/26/2022    8:53 AM 12/10/2021    8:58 AM  In your present state of health, do you have any difficulty performing the following activities:  Hearing? 0 0  Vision? 0 0  Difficulty concentrating or making decisions? 0 0  Walking or climbing stairs? 1 0  Dressing or bathing? 0 0  Doing errands, shopping? 0 0  Preparing Food and eating ? N   Using the Toilet? N   In the past six months, have you accidently leaked urine? N   Do you have problems with loss of bowel control? N   Managing your Medications?  N   Managing your Finances? N   Housekeeping or managing your Housekeeping? N     Patient Care Team: [name], MD as PCP - General Marlou Porch Thana Farr, MD as PCP - Cardiology (Cardiology) Jerline Pain, MD as Consulting Physician  (Cardiology) [place], [place] (Specialist) Gardiner Barefoot, DPM as Consulting Physician (Podiatry) Lyndee Hensen, PT as Physical Therapist (Physical Therapy) Edythe Clarity, [facility] (Pharmacist)  Indicate any recent Medical Services you may have received from other than [facility] providers in the past year (date may be approximate).     Assessment:   This is a routine wellness examination for Krishiv.  Hearing/Vision screen Hearing Screening - Comments:: No trouble hearing Vision Screening - Comments:: Up to date [facility]  Dietary issues and exercise activities discussed: Current Exercise Habits: The patient does not participate in regular exercise at present   Goals Addressed             This Visit's Progress    Patient Stated       Continue current lifestyle       Depression Screen    08/26/2022    8:52 AM 08/25/2022    2:07 PM 05/28/2022    1:27 PM 12/10/2021    8:59 AM 08/13/2021   10:13 AM 06/22/2021   11:31 AM 01/01/2021    9:33 AM  PHQ 2/9 Scores  PHQ - 2 Score 0 0 0 0 0 0 0  PHQ- 9 Score 0 0 0 0  0 0    Fall Risk    08/26/2022    8:46 AM 08/25/2022    2:07 PM 05/28/2022    1:27 PM 12/10/2021    8:59 AM 08/13/2021   10:04 AM  Fall Risk   Falls in the past year? 0 0 0 0 1  Number falls in past yr: 0  0  0  Injury with Fall? 0  0  0  Risk for fall due to :  No Fall Risks No Fall Risks No Fall Risks   Follow up Falls evaluation completed;Education provided;Falls prevention discussed  Falls evaluation completed Falls evaluation completed Falls evaluation completed;Falls prevention discussed    FALL RISK PREVENTION PERTAINING TO THE HOME:  Any stairs in or around the home? No  If so, are there any without handrails? No  Home free of loose throw rugs in walkways, pet beds, electrical cords, etc? Yes  Adequate lighting in your home to reduce risk of falls? Yes   ASSISTIVE DEVICES UTILIZED TO PREVENT FALLS:  Life alert?  No  Use of a cane, walker or w/c? No  Grab bars in the bathroom? Yes  Shower chair or bench in shower? Yes  Elevated toilet seat or a handicapped toilet? Yes   TIMED UP AND GO:  Was the test performed? No .    Cognitive Function:    02/24/2018    9:12 AM 09/25/2015    1:32 PM  MMSE - Mini Mental State Exam  Orientation to time 5 5  Orientation to Place 5 5  Registration 3 3  Attention/ Calculation 5 5  Recall 3 2  Language- name 2 objects 2 2  Language- repeat 1 1  Language- follow 3 step command 3 3  Language- read & follow direction 1 1  Write a sentence 1 1  Copy design 1 1  Total score 30 29        08/26/2022    8:49 AM 08/11/2020  9:10 AM  6CIT Screen  What Year? 0 points 0 points  What month? 0 points 0 points  What time? 0 points 0 points  Count back from 20 0 points 0 points  Months in reverse 0 points 0 points  Repeat phrase 0 points 0 points  Total Score 0 points 0 points    Immunizations Immunization History  Administered Date(s) Administered   Fluad Quad(high Dose 65+) 07/14/2019, 07/18/2020, 06/22/2021   Influenza Split 08/06/2015, 07/25/2022   Influenza Whole 08/14/2007, 07/23/2009, 07/10/2010, 08/11/2016   Influenza, High Dose Seasonal PF 07/30/2016   Influenza,inj,Quad PF,6+ Mos 08/14/2013, 08/13/2014, 08/10/2018   Influenza,inj,quad, With Preservative 11/11/2017   Influenza-Unspecified 08/06/2015, 08/04/2017, 06/22/2021   PFIZER(Purple Top)SARS-COV-2 Vaccination 12/04/2019, 12/25/2019, 11/24/2020   Pneumococcal Conjugate-13 01/16/2015   Pneumococcal Polysaccharide-23 12/22/2009, 01/26/2017   Pneumococcal-Unspecified 10/11/2016   Tdap 08/28/2014   Zoster Recombinat (Shingrix) 10/31/2018, 04/06/2019   Zoster, Live 08/20/2010    TDAP status: Up to date  Flu Vaccine status: Up to date  Pneumococcal vaccine status: Up to date  Covid-19 vaccine status: Information provided on how to obtain vaccines.   Qualifies for Shingles Vaccine?  No   Zostavax completed Yes   Shingrix Completed?: Yes  Screening Tests Health Maintenance  Topic Date Due   HEMOGLOBIN A1C  11/28/2022   OPHTHALMOLOGY EXAM  01/13/2023   FOOT EXAM  06/29/2023   Medicare Annual Wellness (AWV)  08/27/2023   TETANUS/TDAP  08/28/2024   Pneumonia Vaccine 37+ Years old  Completed   INFLUENZA VACCINE  Completed   Zoster Vaccines- Shingrix  Completed   HPV VACCINES  Aged Out   COVID-19 Vaccine  Discontinued    Health Maintenance  There are no preventive care reminders to display for this patient.   Colonoscopy scheduled 09-16-2022  Lung Cancer Screening: (Low Dose CT Chest recommended if Age 23-80 years, 30 pack-year currently smoking OR have quit w/in 15years.) does not qualify.   Lung Cancer Screening Referral:   Additional Screening:  Hepatitis C Screening: does not qualify;   Vision Screening: Recommended annual ophthalmology exams for early detection of glaucoma and other disorders of the eye. Is the patient up to date with their annual eye exam?  Yes  Who is the provider or what is the name of the office in which the patient attends annual eye exams? [facility] If pt is not established with a provider, would they like to be referred to a provider to establish care? No .   Dental Screening: Recommended annual dental exams for proper oral hygiene  Community Resource Referral / Chronic Care Management: CRR required this visit?  No   CCM required this visit?  No      Plan:     I have personally reviewed and noted the following in the patient's chart:   Medical and social history Use of alcohol, tobacco or illicit drugs  Current medications and supplements including opioid prescriptions. Patient is not currently taking opioid prescriptions. Functional ability and status Nutritional status Physical activity Advanced directives List of other physicians Hospitalizations, surgeries, and ER visits in previous 12  months Vitals Screenings to include cognitive, depression, and falls Referrals and appointments  In addition, I have reviewed and discussed with patient certain preventive protocols, quality metrics, and best practice recommendations. A written personalized care plan for preventive services as well as general preventive health recommendations were provided to patient.     Leroy Kennedy, LPN   [id number]   Nurse Notes:

## 2022-08-26 NOTE — Patient Instructions (Signed)
Marvin Foster , Thank you for taking time to come for your Medicare Wellness Visit. I appreciate your ongoing commitment to your health goals. Please review the following plan we discussed and let me know if I can assist you in the future.   These are the goals we discussed:  Goals       <enter goal here> (pt-stated)      Maintain current health by staying active.       Monitor and Manage My Blood Sugar-Diabetes Type 2      Timeframe:  Long-Range Goal Priority:  High Start Date:    09/09/21                         Expected End Date:  03/09/22                     Follow Up Date 02/30/23    - check blood sugar at prescribed times - check blood sugar if I feel it is too high or too low - enter blood sugar readings and medication or insulin into daily log    Why is this important?   Checking your blood sugar at home helps to keep it from getting very high or very low.  Writing the results in a diary or log helps the doctor know how to care for you.  Your blood sugar log should have the time, date and the results.  Also, write down the amount of insulin or other medicine that you take.  Other information, like what you ate, exercise done and how you were feeling, will also be helpful.     Notes:       Patient Stated      Maintain current lifestyle      Patient Stated      Continue current lifestyle      [place] (see longitudinal plan of care for additional care plan information)  Current Barriers:  Chronic Disease Management support, education, and care coordination needs related to Hypertension, Hyperlipidemia, and Diabetes   Hypertension BP Readings from Last 3 Encounters:  07/18/20 121/86  07/11/20 (!) 154/91  07/10/20 (!) 147/78  Pharmacist Clinical Goal(s): Over the next 365 days, patient will work with PharmD and providers to maintain BP goal <140/90 Current regimen:  Enalapril 5 mg once daily Metoprolol tartrate 100 mg twice daily  (heart rate control in atrial fibrillation, some blood pressure lowering) Interventions: Reviewed diet/exercise - Maintain a healthy weight and exercise regularly, as directed by your health care provider. Eat healthy foods, such as: Lean proteins, complex carbohydrates, fresh fruits and vegetables, low-fat dairy products, healthy fats. Patient self care activities - Over the next 365 days, patient will: Check BP at least once every 1-2 weeks, document, and provide at future appointments Ensure daily salt intake < 2300 mg/day  Hyperlipidemia Lab Results  Component Value Date/Time   [facility] 41 03/27/2020 07:34 AM  Pharmacist Clinical Goal(s): Over the next 365 days, patient will work with PharmD and providers to maintain LDL goal < 70 Current regimen:  Atorvastatin 40 mg once daily Interventions: Reviewed side effects - none noted at present! Patient self care activities - Over the next 365 days, patient will: Continue current management  Diabetes Lab Results  Component Value Date/Time   HGBA1C 6.8 (H) 07/07/2020 03:04 AM   HGBA1C 6.8 (H) 03/27/2020 07:34 AM  Pharmacist Clinical Goal(s): Over the next  365 days, patient will work with PharmD and providers to maintain A1c goal <7% Current regimen:  Metformin 1000 mg twice daily with meals. Interventions: Reviewed diet - Maintain a healthy weight and exercise regularly, as directed by your health care provider. Eat healthy foods, such as: Lean proteins, complex carbohydrates, fresh fruits and vegetables, low-fat dairy products, healthy fats. Patient self care activities - Over the next 365 days, patient will: Check blood sugar daily, document, and provide at future appointments Contact provider with any episodes of hypoglycemia  Medication management Pharmacist Clinical Goal(s): Over the next 365 days, patient will work with PharmD and providers to maintain optimal medication adherence Current pharmacy:  [facility] Interventions Comprehensive medication review performed. Continue current medication management strategy Patient self care activities - Over the next 365 days, patient will: Take medications as prescribed Report any questions or concerns to PharmD and/or provider(s) Initial goal documentation.        This is a list of the screening recommended for you and due dates:  Health Maintenance  Topic Date Due   Hemoglobin A1C  11/28/2022   Eye exam for diabetics  01/13/2023   Complete foot exam   06/29/2023   Medicare Annual Wellness Visit  08/27/2023   Tetanus Vaccine  08/28/2024   Pneumonia Vaccine  Completed   Flu Shot  Completed   Zoster (Shingles) Vaccine  Completed   HPV Vaccine  Aged Out   COVID-19 Vaccine  Discontinued    Advanced directives: Education provided  Conditions/risks identified:   Next appointment: Follow up in one year for your annual wellness visit.      Preventive Care 53 Years and Older, Male  Preventive care refers to lifestyle choices and visits with your health care provider that can promote health and wellness. What does preventive care include? A yearly physical exam. This is also called an annual well check. Dental exams once or twice a year. Routine eye exams. Ask your health care provider how often you should have your eyes checked. Personal lifestyle choices, including: Daily care of your teeth and gums. Regular physical activity. Eating a healthy diet. Avoiding tobacco and drug use. Limiting alcohol use. Practicing safe sex. Taking low doses of aspirin every day. Taking vitamin and mineral supplements as recommended by your health care provider. What happens during an annual well check? The services and screenings done by your health care provider during your annual well check will depend on your age, overall health, lifestyle risk factors, and family history of disease. Counseling  Your health care provider may ask you questions  about your: Alcohol use. Tobacco use. Drug use. Emotional well-being. Home and relationship well-being. Sexual activity. Eating habits. History of falls. Memory and ability to understand (cognition). Work and work Statistician. Screening  You may have the following tests or measurements: Height, weight, and BMI. Blood pressure. Lipid and cholesterol levels. These may be checked every 5 years, or more frequently if you are over 85 years old. Skin check. Lung cancer screening. You may have this screening every year starting at age 59 if you have a 30-pack-year history of smoking and currently smoke or have quit within the past 15 years. Fecal occult blood test (FOBT) of the stool. You may have this test every year starting at age 65. Flexible sigmoidoscopy or colonoscopy. You may have a sigmoidoscopy every 5 years or a colonoscopy every 10 years starting at age 40. Prostate cancer screening. Recommendations will vary depending on your family history and other risks. Hepatitis  C blood test. Hepatitis B blood test. Sexually transmitted disease (STD) testing. Diabetes screening. This is done by checking your blood sugar (glucose) after you have not eaten for a while (fasting). You may have this done every 1-3 years. Abdominal aortic aneurysm (AAA) screening. You may need this if you are a current or former smoker. Osteoporosis. You may be screened starting at age 15 if you are at high risk. Talk with your health care provider about your test results, treatment options, and if necessary, the need for more tests. Vaccines  Your health care provider may recommend certain vaccines, such as: Influenza vaccine. This is recommended every year. Tetanus, diphtheria, and acellular pertussis (Tdap, Td) vaccine. You may need a Td booster every 10 years. Zoster vaccine. You may need this after age 33. Pneumococcal 13-valent conjugate (PCV13) vaccine. One dose is recommended after age 73. Pneumococcal  polysaccharide (PPSV23) vaccine. One dose is recommended after age 74. Talk to your health care provider about which screenings and vaccines you need and how often you need them. This information is not intended to replace advice given to you by your health care provider. Make sure you discuss any questions you have with your health care provider. Document Released: 10/24/2015 Document Revised: 06/16/2016 Document Reviewed: 07/29/2015 Elsevier Interactive Patient Education  2017 [employer] Prevention in the Home Falls can cause injuries. They can happen to people of all ages. There are many things you can do to make your home safe and to help prevent falls. What can I do on the outside of my home? Regularly fix the edges of walkways and driveways and fix any cracks. Remove anything that might make you trip as you walk through a door, such as a raised step or threshold. Trim any bushes or trees on the path to your home. Use bright outdoor lighting. Clear any walking paths of anything that might make someone trip, such as rocks or tools. Regularly check to see if handrails are loose or broken. Make sure that both sides of any steps have handrails. Any raised decks and porches should have guardrails on the edges. Have any leaves, snow, or ice cleared regularly. Use sand or salt on walking paths during winter. Clean up any spills in your garage right away. This includes oil or grease spills. What can I do in the bathroom? Use night lights. Install grab bars by the toilet and in the tub and shower. Do not use towel bars as grab bars. Use non-skid mats or decals in the tub or shower. If you need to sit down in the shower, use a plastic, non-slip stool. Keep the floor dry. Clean up any water that spills on the floor as soon as it happens. Remove soap buildup in the tub or shower regularly. Attach bath mats securely with double-sided non-slip rug tape. Do not have throw rugs and other  things on the floor that can make you trip. What can I do in the bedroom? Use night lights. Make sure that you have a light by your bed that is easy to reach. Do not use any sheets or blankets that are too big for your bed. They should not hang down onto the floor. Have a firm chair that has side arms. You can use this for support while you get dressed. Do not have throw rugs and other things on the floor that can make you trip. What can I do in the kitchen? Clean up any spills right away. Avoid walking  on wet floors. Keep items that you use a lot in easy-to-reach places. If you need to reach something above you, use a strong step stool that has a grab bar. Keep electrical cords out of the way. Do not use floor polish or wax that makes floors slippery. If you must use wax, use non-skid floor wax. Do not have throw rugs and other things on the floor that can make you trip. What can I do with my stairs? Do not leave any items on the stairs. Make sure that there are handrails on both sides of the stairs and use them. Fix handrails that are broken or loose. Make sure that handrails are as long as the stairways. Check any carpeting to make sure that it is firmly attached to the stairs. Fix any carpet that is loose or worn. Avoid having throw rugs at the top or bottom of the stairs. If you do have throw rugs, attach them to the floor with carpet tape. Make sure that you have a light switch at the top of the stairs and the bottom of the stairs. If you do not have them, ask someone to add them for you. What else can I do to help prevent falls? Wear shoes that: Do not have high heels. Have rubber bottoms. Are comfortable and fit you well. Are closed at the toe. Do not wear sandals. If you use a stepladder: Make sure that it is fully opened. Do not climb a closed stepladder. Make sure that both sides of the stepladder are locked into place. Ask someone to hold it for you, if possible. Clearly  mark and make sure that you can see: Any grab bars or handrails. First and last steps. Where the edge of each step is. Use tools that help you move around (mobility aids) if they are needed. These include: Canes. Walkers. Scooters. Crutches. Turn on the lights when you go into a dark area. Replace any light bulbs as soon as they burn out. Set up your furniture so you have a clear path. Avoid moving your furniture around. If any of your floors are uneven, fix them. If there are any pets around you, be aware of where they are. Review your medicines with your doctor. Some medicines can make you feel dizzy. This can increase your chance of falling. Ask your doctor what other things that you can do to help prevent falls. This information is not intended to replace advice given to you by your health care provider. Make sure you discuss any questions you have with your health care provider. Document Released: 07/24/2009 Document Revised: 03/04/2016 Document Reviewed: 11/01/2014 Elsevier Interactive Patient Education  2017 [employer].

## 2022-09-01 ENCOUNTER — Ambulatory Visit: Payer: Medicare HMO | Attending: Cardiology | Admitting: *Deleted

## 2022-09-01 DIAGNOSIS — I4891 Unspecified atrial fibrillation: Secondary | ICD-10-CM

## 2022-09-01 DIAGNOSIS — Z5181 Encounter for therapeutic drug level monitoring: Secondary | ICD-10-CM

## 2022-09-01 LAB — POCT INR: INR: 3.5 — AB (ref 2.0–3.0)

## 2022-09-01 NOTE — Patient Instructions (Addendum)
Description   *DOES NOT NEED A PRINTOUT*  Do not take any warfarin today then continue taking 1 tablet daily. Refer to instructions for procedure. Repeat INR in 1 week after procedure. Call us with any medication changes or concerns # [phone] or [phone] [facility] Main # [phone].        When you resume your warfarin take an extra 1/2 tablet (total of 1.5 tablets) on 12/7 and 12/8 then resume normal dose of 1 tablet daily.

## 2022-09-08 IMAGING — DX DG CHEST 2V
2 series · 2 of 2 positions shown · non-contrast
Comparison: Rib radiographs 06/26/2018

CLINICAL DATA: Cough. COVID positive 9 days ago. Shortness of
breath.

EXAM:
CHEST - 2 VIEW

[chest pa]
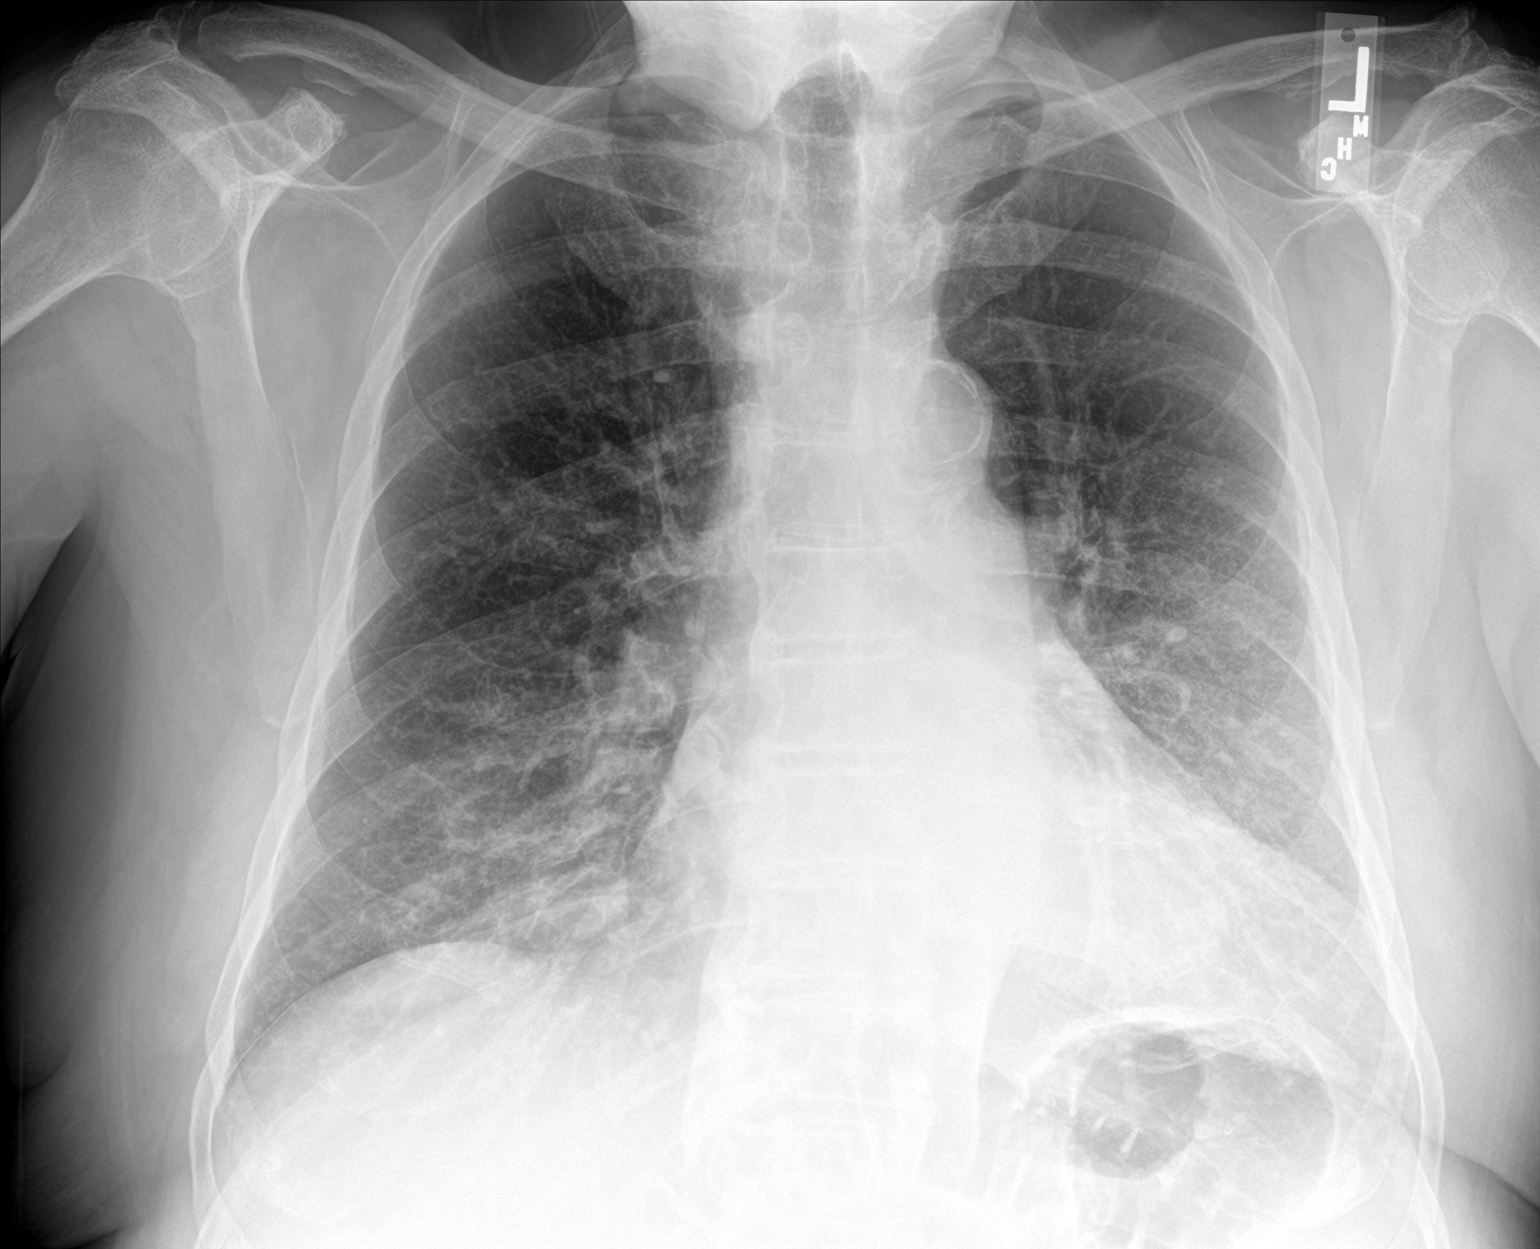

[chest lat]
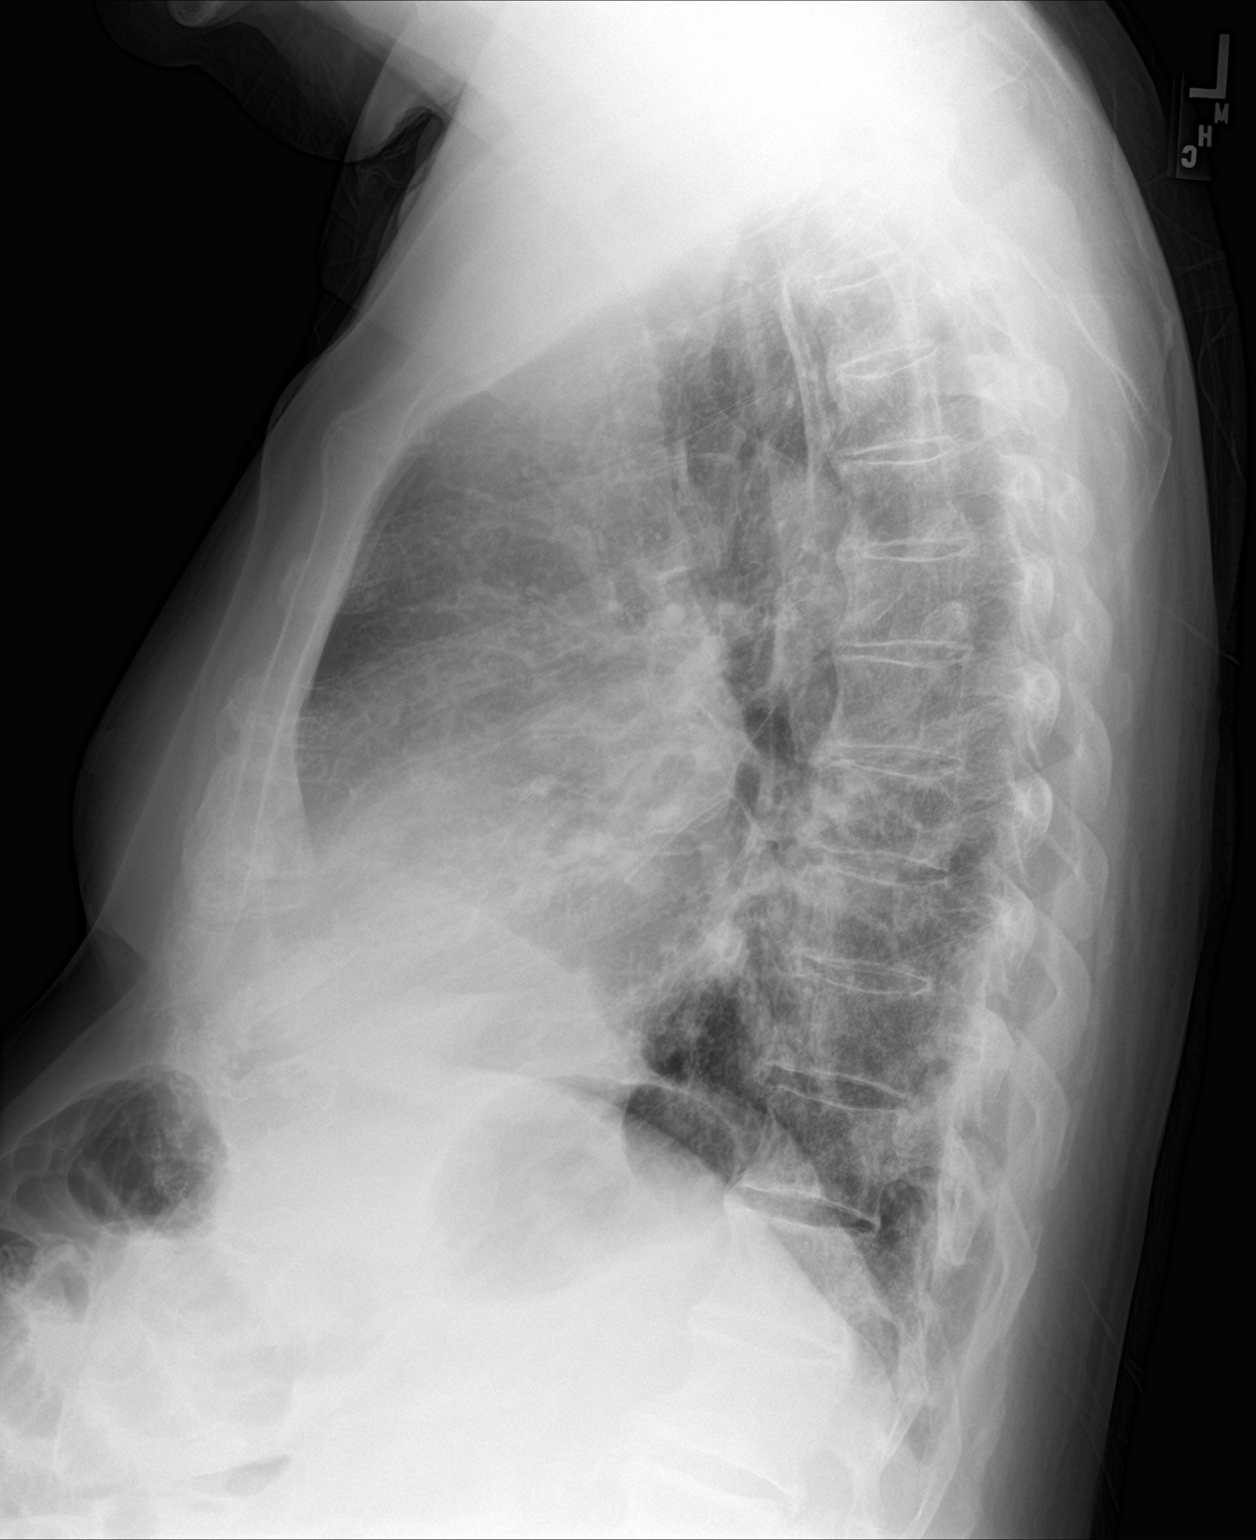

[2 of 2 positions shown; findings below may reference images not displayed]

FINDINGS: Mild cardiomegaly. Normal mediastinal contours. Aortic
atherosclerosis. Coronary artery calcifications versus stent. Patchy
heterogeneous bilateral airspace opacities in a mid-lower lung zone
predominant distribution. No pneumothorax or evidence of
pneumomediastinum. No significant pleural effusion. Degenerative
change of the shoulders.
IMPRESSION: Patchy heterogeneous bilateral airspace opacities in a mid-lower
lung zone predominant distribution, consistent with [id number]
pneumonia.

## 2022-09-09 ENCOUNTER — Encounter (HOSPITAL_COMMUNITY): Payer: Self-pay | Admitting: Gastroenterology

## 2022-09-09 NOTE — Progress Notes (Signed)
Attempted to obtain medical history via telephone, unable to reach at this time. HIPAA compliant voicemail message left requesting return call to pre surgical testing department. 

## 2022-09-13 ENCOUNTER — Telehealth: Payer: Self-pay | Admitting: Gastroenterology

## 2022-09-13 DIAGNOSIS — H401123 Primary open-angle glaucoma, left eye, severe stage: Secondary | ICD-10-CM | POA: Diagnosis not present

## 2022-09-13 DIAGNOSIS — H35371 Puckering of macula, right eye: Secondary | ICD-10-CM | POA: Diagnosis not present

## 2022-09-13 DIAGNOSIS — H18513 Endothelial corneal dystrophy, bilateral: Secondary | ICD-10-CM | POA: Diagnosis not present

## 2022-09-13 DIAGNOSIS — H401112 Primary open-angle glaucoma, right eye, moderate stage: Secondary | ICD-10-CM | POA: Diagnosis not present

## 2022-09-13 DIAGNOSIS — H353221 Exudative age-related macular degeneration, left eye, with active choroidal neovascularization: Secondary | ICD-10-CM | POA: Diagnosis not present

## 2022-09-13 NOTE — Telephone Encounter (Signed)
Patient is calling states he hasn't spoke with the hospital yet regarding his procedure and has questions about his medications. Please advise

## 2022-09-13 NOTE — Telephone Encounter (Signed)
Returned call to patient. Reviewed medication list, the only medication that needs to be held is Metformin the day of the procedure. Patient was already notified of Coumadin hold, last dose should have been 09/10/22. Dr. Fuller Plan will let pt know when to resume after procedure. I left patient a detailed vm with this information. I also informed patient that I will send this information to his MyChart for his review. Pt advised to call back with any additional questions or concerns.

## 2022-09-14 ENCOUNTER — Other Ambulatory Visit: Payer: Self-pay | Admitting: Family Medicine

## 2022-09-15 NOTE — Anesthesia Preprocedure Evaluation (Signed)
Anesthesia Evaluation  Patient identified by MRN, date of birth, ID band Patient awake    Reviewed: Allergy & Precautions, NPO status , Patient's Chart, lab work & pertinent test results  History of Anesthesia Complications Negative for: history of anesthetic complications  Airway Mallampati: III  TM Distance: >3 FB Neck ROM: Full    Dental  (+) Upper Dentures, Partial Lower   Pulmonary neg pulmonary ROS   Pulmonary exam normal breath sounds clear to auscultation       Cardiovascular hypertension, (-) angina + CAD and + Cardiac Stents  (-) CABG + dysrhythmias (RBBB) Atrial Fibrillation + Valvular Problems/Murmurs AS and MR  Rhythm:Irregular Rate:Normal + Systolic murmurs HLD  TTE 11/26/2021: IMPRESSIONS     1. Left ventricular ejection fraction, by estimation, is 60 to 65%. The  left ventricle has normal function. The left ventricle has no regional  wall motion abnormalities. Left ventricular diastolic function could not  be evaluated.   2. Right ventricular systolic function is normal. The right ventricular  size is normal.   3. Left atrial size was mildly dilated.   4. Right atrial size was mildly dilated.   5. Moderate mitral subvalvular calcification.   6. The mitral valve is rheumatic. Mild to moderate mitral valve  regurgitation. Moderate mitral annular calcification.   7. The aortic valve is tricuspid. There is moderate calcification of the  aortic valve. Aortic valve regurgitation is trivial. Moderate to severe  aortic valve stenosis. Aortic valve area, by VTI measures 1.05 cm. Aortic  valve mean gradient measures 31.0   mmHg. Aortic valve Vmax measures 3.31 m/s. DI is 0.25.   8. Aortic dilatation noted. There is borderline dilatation of the  ascending aorta, measuring 39 mm.     Neuro/Psych  Neuromuscular disease (spinal stenosis, neuropathy)    GI/Hepatic Neg liver ROS,neg GERD  ,,diverticulosis    Endo/Other  diabetes, Type 2Hypothyroidism    Renal/GU negative Renal ROS     Musculoskeletal  (+) Arthritis ,    Abdominal  (+) + obese  Peds  Hematology  (+) Blood dyscrasia, anemia   Anesthesia Other Findings INR 3.5 on 09/01/22  Last coumadin: 09/10/22  Reproductive/Obstetrics                             Anesthesia Physical Anesthesia Plan  ASA: 4  Anesthesia Plan: MAC   Post-op Pain Management:    Induction:   PONV Risk Score and Plan: 1 and Propofol infusion and Treatment may vary due to age or medical condition  Airway Management Planned:   Additional Equipment: ClearSight  Intra-op Plan:   Post-operative Plan:   Informed Consent: I have reviewed the patients History and Physical, chart, labs and discussed the procedure including the risks, benefits and alternatives for the proposed anesthesia with the patient or authorized representative who has indicated his/her understanding and acceptance.     Dental advisory given  Plan Discussed with: CRNA and Anesthesiologist  Anesthesia Plan Comments: (Discussed with patient risks of MAC including, but not limited to, minor pain or discomfort, hearing people in the room, and possible need for backup general anesthesia. Risks for general anesthesia also discussed including, but not limited to, sore throat, hoarse voice, chipped/damaged teeth, injury to vocal cords, nausea and vomiting, allergic reactions, lung infection, heart attack, stroke, and death. Discussed he is at higher risk for adverse cardiac events, including death, due to his cardiac co-morbidities. All questions answered. )  Anesthesia Quick Evaluation  

## 2022-09-16 ENCOUNTER — Encounter (HOSPITAL_COMMUNITY): Payer: Self-pay | Admitting: Gastroenterology

## 2022-09-16 ENCOUNTER — Ambulatory Visit (HOSPITAL_COMMUNITY)
Admission: RE | Admit: 2022-09-16 | Discharge: 2022-09-16 | Disposition: A | Discharge status: Home / Self Care | Payer: Medicare HMO | Source: Ambulatory Visit | Attending: Gastroenterology | Admitting: Gastroenterology

## 2022-09-16 ENCOUNTER — Ambulatory Visit (HOSPITAL_COMMUNITY): Payer: Medicare HMO | Admitting: Anesthesiology

## 2022-09-16 ENCOUNTER — Ambulatory Visit (HOSPITAL_BASED_OUTPATIENT_CLINIC_OR_DEPARTMENT_OTHER): Payer: Medicare HMO | Admitting: Anesthesiology

## 2022-09-16 ENCOUNTER — Encounter (HOSPITAL_COMMUNITY)
Admission: RE | Disposition: A | Discharge status: Home / Self Care | Payer: Self-pay | Source: Ambulatory Visit | Attending: Gastroenterology

## 2022-09-16 ENCOUNTER — Other Ambulatory Visit: Payer: Self-pay

## 2022-09-16 DIAGNOSIS — K640 First degree hemorrhoids: Secondary | ICD-10-CM | POA: Insufficient documentation

## 2022-09-16 DIAGNOSIS — E1140 Type 2 diabetes mellitus with diabetic neuropathy, unspecified: Secondary | ICD-10-CM | POA: Insufficient documentation

## 2022-09-16 DIAGNOSIS — K5750 Diverticulosis of both small and large intestine without perforation or abscess without bleeding: Secondary | ICD-10-CM

## 2022-09-16 DIAGNOSIS — Z7901 Long term (current) use of anticoagulants: Secondary | ICD-10-CM | POA: Diagnosis not present

## 2022-09-16 DIAGNOSIS — D508 Other iron deficiency anemias: Secondary | ICD-10-CM

## 2022-09-16 DIAGNOSIS — D122 Benign neoplasm of ascending colon: Secondary | ICD-10-CM | POA: Diagnosis not present

## 2022-09-16 DIAGNOSIS — D124 Benign neoplasm of descending colon: Secondary | ICD-10-CM | POA: Diagnosis not present

## 2022-09-16 DIAGNOSIS — I10 Essential (primary) hypertension: Secondary | ICD-10-CM | POA: Insufficient documentation

## 2022-09-16 DIAGNOSIS — K31819 Angiodysplasia of stomach and duodenum without bleeding: Secondary | ICD-10-CM | POA: Insufficient documentation

## 2022-09-16 DIAGNOSIS — K5740 Diverticulitis of both small and large intestine with perforation and abscess without bleeding: Secondary | ICD-10-CM | POA: Diagnosis not present

## 2022-09-16 DIAGNOSIS — K2970 Gastritis, unspecified, without bleeding: Secondary | ICD-10-CM | POA: Diagnosis not present

## 2022-09-16 DIAGNOSIS — D123 Benign neoplasm of transverse colon: Secondary | ICD-10-CM | POA: Diagnosis not present

## 2022-09-16 DIAGNOSIS — D62 Acute posthemorrhagic anemia: Secondary | ICD-10-CM | POA: Insufficient documentation

## 2022-09-16 DIAGNOSIS — K5520 Angiodysplasia of colon without hemorrhage: Secondary | ICD-10-CM

## 2022-09-16 DIAGNOSIS — E039 Hypothyroidism, unspecified: Secondary | ICD-10-CM | POA: Diagnosis not present

## 2022-09-16 DIAGNOSIS — D759 Disease of blood and blood-forming organs, unspecified: Secondary | ICD-10-CM | POA: Diagnosis not present

## 2022-09-16 DIAGNOSIS — I2510 Atherosclerotic heart disease of native coronary artery without angina pectoris: Secondary | ICD-10-CM

## 2022-09-16 DIAGNOSIS — K2951 Unspecified chronic gastritis with bleeding: Secondary | ICD-10-CM | POA: Diagnosis not present

## 2022-09-16 DIAGNOSIS — Z86010 Personal history of colonic polyps: Secondary | ICD-10-CM | POA: Diagnosis not present

## 2022-09-16 DIAGNOSIS — Z09 Encounter for follow-up examination after completed treatment for conditions other than malignant neoplasm: Secondary | ICD-10-CM

## 2022-09-16 DIAGNOSIS — I4821 Permanent atrial fibrillation: Secondary | ICD-10-CM | POA: Diagnosis not present

## 2022-09-16 DIAGNOSIS — I080 Rheumatic disorders of both mitral and aortic valves: Secondary | ICD-10-CM | POA: Insufficient documentation

## 2022-09-16 DIAGNOSIS — R195 Other fecal abnormalities: Secondary | ICD-10-CM

## 2022-09-16 DIAGNOSIS — M1990 Unspecified osteoarthritis, unspecified site: Secondary | ICD-10-CM | POA: Diagnosis not present

## 2022-09-16 DIAGNOSIS — I4510 Unspecified right bundle-branch block: Secondary | ICD-10-CM | POA: Insufficient documentation

## 2022-09-16 DIAGNOSIS — D126 Benign neoplasm of colon, unspecified: Secondary | ICD-10-CM | POA: Diagnosis not present

## 2022-09-16 DIAGNOSIS — K2950 Unspecified chronic gastritis without bleeding: Secondary | ICD-10-CM | POA: Insufficient documentation

## 2022-09-16 DIAGNOSIS — A048 Other specified bacterial intestinal infections: Secondary | ICD-10-CM | POA: Diagnosis not present

## 2022-09-16 DIAGNOSIS — K3189 Other diseases of stomach and duodenum: Secondary | ICD-10-CM | POA: Diagnosis not present

## 2022-09-16 HISTORY — PX: COLONOSCOPY WITH PROPOFOL: SHX5780

## 2022-09-16 HISTORY — PX: HOT HEMOSTASIS: SHX5433

## 2022-09-16 HISTORY — PX: ESOPHAGOGASTRODUODENOSCOPY (EGD) WITH PROPOFOL: SHX5813

## 2022-09-16 HISTORY — PX: POLYPECTOMY: SHX5525

## 2022-09-16 HISTORY — PX: BIOPSY: SHX5522

## 2022-09-16 LAB — GLUCOSE, CAPILLARY: Glucose-Capillary: 106 mg/dL — ABNORMAL HIGH (ref 70–99)

## 2022-09-16 SURGERY — ESOPHAGOGASTRODUODENOSCOPY (EGD) WITH PROPOFOL
Anesthesia: Monitor Anesthesia Care

## 2022-09-16 MED ORDER — SODIUM CHLORIDE 0.9 % IV SOLN: INTRAVENOUS | Status: DC

## 2022-09-16 MED ORDER — EPHEDRINE SULFATE-NACL 50-0.9 MG/10ML-% IV SOSY
PREFILLED_SYRINGE | INTRAVENOUS | Status: DC | PRN
  Administered 2022-09-16: 5 mg via INTRAVENOUS

## 2022-09-16 MED ORDER — PROPOFOL 1000 MG/100ML IV EMUL
INTRAVENOUS | Status: AC
  Filled 2022-09-16: qty 100

## 2022-09-16 MED ORDER — PHENYLEPHRINE HCL-NACL 20-0.9 MG/250ML-% IV SOLN
INTRAVENOUS | Status: DC | PRN
  Administered 2022-09-16: 15 ug/min via INTRAVENOUS

## 2022-09-16 MED ORDER — PROPOFOL 500 MG/50ML IV EMUL
INTRAVENOUS | Status: DC | PRN
  Administered 2022-09-16: 100 ug/kg/min via INTRAVENOUS

## 2022-09-16 MED ORDER — LACTATED RINGERS IV SOLN: INTRAVENOUS | Status: DC

## 2022-09-16 SURGICAL SUPPLY — 25 items

## 2022-09-16 NOTE — Op Note (Signed)
[facility] Patient Name: Marvin Foster Procedure Date: 09/16/2022 MRN: [id number] Attending MD: Ladene Artist , MD, [id number] Date of Birth: [birth year] CSN: [id number] Age: 88 Admit Type: Inpatient Procedure:                Colonoscopy Indications:              Heme positive stool, Anemia, Personal history of                            adenomatous colon polyps Providers:                Pricilla Riffle. Fuller Plan, MD, Jeanella Cara, RN,                            William Dalton, Technician Referring MD:             Aundra Millet. Birdie Riddle, MD Medicines:                Monitored Anesthesia Care Complications:            No immediate complications. Estimated blood loss:                            None. Estimated Blood Loss:     Estimated blood loss: none. Procedure:                Pre-Anesthesia Assessment:                           - Prior to the procedure, a History and Physical                            was performed, and patient medications and                            allergies were reviewed. The patient's tolerance of                            previous anesthesia was also reviewed. The risks                            and benefits of the procedure and the sedation                            options and risks were discussed with the patient.                            All questions were answered, and informed consent                            was obtained. Prior Anticoagulants: The patient has                            taken Coumadin (warfarin), last dose was 5 days  prior to procedure. ASA Grade Assessment: III - A                            patient with severe systemic disease. After                            reviewing the risks and benefits, the patient was                            deemed in satisfactory condition to undergo the                            procedure.                           After obtaining informed consent, the  colonoscope                            was passed under direct vision. Throughout the                            procedure, the patient's blood pressure, pulse, and                            oxygen saturations were monitored continuously. The                            [id number]-HQ190L ([id number] Olympus colonoscope was                            introduced through the anus and advanced to the the                            cecum, identified by appendiceal orifice and                            ileocecal valve. The ileocecal valve, appendiceal                            orifice, and rectum were photographed. The quality                            of the bowel preparation was good. The colonoscopy                            was performed without difficulty. The patient                            tolerated the procedure well. Scope In: 10:07:04 AM Scope Out: 10:27:33 AM Scope Withdrawal Time: 0 hours 18 minutes 22 seconds  Total Procedure Duration: 0 hours 20 minutes 29 seconds  Findings:      The perianal and digital rectal examinations were normal.      Five sessile polyps were found in the descending colon (1), transverse  colon (2) and ascending colon (2). The polyps were 7 to 9 mm in size.       These polyps were removed with a cold snare. Resection and retrieval       were complete.      Multiple medium-mouthed diverticula were found in the left colon. There       was no evidence of diverticular bleeding.      Internal hemorrhoids were found during retroflexion. The hemorrhoids       were small and Grade I (internal hemorrhoids that do not prolapse).      The exam was otherwise without abnormality on direct and retroflexion       views. Impression:               - Five 7 to 9 mm polyps in the descending colon, in                            the transverse colon and in the ascending colon,                            removed with a cold snare. Resected and retrieved.                            - Mild diverticulosis in the left colon.                           - Internal hemorrhoids.                           - The examination was otherwise normal on direct                            and retroflexion views. Moderate Sedation:      Not Applicable - Patient had care per Anesthesia. Recommendation:           - Resume Coumadin (warfarin) in 2 days at prior                            dose. Refer to managing physician for further                            adjustment of therapy.                           - Patient has a contact number available for                            emergencies. The signs and symptoms of potential                            delayed complications were discussed with the                            patient. Return to normal activities tomorrow.  Written discharge instructions were provided to the                            patient.                           - Resume previous diet.                           - Continue present medications.                           - Await pathology results.                           - No repeat colonoscopy due to comorbidities and                            age.                           - GI follow up with Dr. Hilarie Fredrickson. Procedure Code(s):        --- Professional ---                           [phone], Colonoscopy, flexible; with removal of                            tumor(s), polyp(s), or other lesion(s) by snare                            technique Diagnosis Code(s):        --- Professional ---                           D12.4, Benign neoplasm of descending colon                           D12.3, Benign neoplasm of transverse colon (hepatic                            flexure or splenic flexure)                           D12.2, Benign neoplasm of ascending colon                           K64.0, First degree hemorrhoids                           R19.5, Other fecal abnormalities                            D62, Acute posthemorrhagic anemia                           K57.30, Diverticulosis of large intestine without  perforation or abscess without bleeding CPT copyright 2022 American Medical Association. All rights reserved. The codes documented in this report are preliminary and upon coder review may  be revised to meet current compliance requirements. Ladene Artist, MD 09/16/2022 10:47:50 AM This report has been signed electronically. Number of Addenda: 0

## 2022-09-16 NOTE — Discharge Instructions (Signed)
YOU HAD AN ENDOSCOPIC PROCEDURE TODAY: Refer to the procedure report and other information in the discharge instructions given to you for any specific questions about what was found during the examination. If this information does not answer your questions, please call [place] office at [phone] to clarify.  ° °YOU SHOULD EXPECT: Some feelings of bloating in the abdomen. Passage of more gas than usual. Walking can help get rid of the air that was put into your GI tract during the procedure and reduce the bloating. If you had a lower endoscopy (such as a colonoscopy or flexible sigmoidoscopy) you may notice spotting of blood in your stool or on the toilet paper. Some abdominal soreness may be present for a day or two, also. ° °DIET: Your first meal following the procedure should be a light meal and then it is ok to progress to your normal diet. A half-sandwich or bowl of soup is an example of a good first meal. Heavy or fried foods are harder to digest and may make you feel nauseous or bloated. Drink plenty of fluids but you should avoid alcoholic beverages for 24 hours.  ° °ACTIVITY: Your care partner should take you home directly after the procedure. You should plan to take it easy, moving slowly for the rest of the day. You can resume normal activity the day after the procedure however YOU SHOULD NOT DRIVE, use power tools, machinery or perform tasks that involve climbing or major physical exertion for 24 hours (because of the sedation medicines used during the test).  ° °SYMPTOMS TO REPORT IMMEDIATELY: °A gastroenterologist can be reached at any hour. Please call [phone]  for any of the following symptoms:  °Following lower endoscopy (colonoscopy, flexible sigmoidoscopy) °Excessive amounts of blood in the stool  °Significant tenderness, worsening of abdominal pains  °Swelling of the abdomen that is new, acute  °Fever of 100° or higher  °Following upper endoscopy (EGD, EUS, ERCP, esophageal  dilation) °Vomiting of blood or coffee ground material  °New, significant abdominal pain  °New, significant chest pain or pain under the shoulder blades  °Painful or persistently difficult swallowing  °New shortness of breath  °Black, tarry-looking or red, bloody stools ° °FOLLOW UP:  °If any biopsies were taken you will be contacted by phone or by letter within the next 1-3 weeks. Call [phone]  if you have not heard about the biopsies in 3 weeks.  °Please also call with any specific questions about appointments or follow up tests.  °

## 2022-09-16 NOTE — H&P (Addendum)
History & Physical  Primary Care Physician:  [name], MD Primary Gastroenterologist: Zenovia Jarred, MD  CHIEF COMPLAINT:  Heme positive stool, anemia, weight loss, personal history of adenomatous colon polyps  HPI: Marvin Foster is a 88 y.o. male with heme positive stool, anemia, weight loss, personal history of adenomatous colon polyps, chronic Coumadin anticoagulation and moderate to severe aortic stenosis for colonoscopy and EGD.  Coumadin held for 5 days prior to procedures.   Past Medical History:  Diagnosis Date   Acute respiratory failure due to COVID-19 [facility]) 07/07/2020   ANEMIA 02/18/2010   Qualifier: Diagnosis of  By: Birdie Riddle MD, Belenda Cruise     Arthritis 12/2005   left hand   Arthritis    Left knee   ASPARTATE AMINOTRANSFERASE, SERUM, ELEVATED 11/04/2008   Qualifier: Diagnosis of  By: Birdie Riddle MD, Katherine     Atrial fibrillation ([place]) 07/24/2013   Blood transfusion without reported diagnosis    Bursitis of knee    right knee   CAD (coronary artery disease)    Cardiac arrhythmia due to congenital heart disease    Chronic anticoagulation 06/12/2015   Coagulation defect ([place]) 05/28/2020   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   CORONARY ARTERY DISEASE 03/27/2007   Qualifier: Diagnosis of  By: Cletus Gash MD, Thomas     Diabetes mellitus    Diverticulosis    Glaucoma    left eye   Hx of colonic polyps 06/12/2015   Hyperlipidemia    Hypertension    Hypothyroid 09/21/2018   Long term current use of anticoagulant therapy 07/24/2013   Neuropathy    with pain   Pain due to onychomycosis of toenail of left foot 05/28/2020   Permanent atrial fibrillation (HCC)    RBBB    Scrotal mass 03/07/2012   Spinal stenosis 01/20/2005   Tubular adenoma of colon     Past Surgical History:  Procedure Laterality Date   BACK SURGERY     CATARACT EXTRACTION  08/11/09   right   corneal endothelial Left 2013   CORNEAL TRANSPLANT Left     eye   CORONARY ANGIOPLASTY WITH STENT PLACEMENT     EYE SURGERY     SMALL INTESTINE SURGERY     SPINE SURGERY     VASECTOMY      Prior to Admission medications   Medication Sig Start Date End Date Taking? Authorizing Provider  amLODipine (NORVASC) 2.5 MG tablet Take 1 tablet by mouth daily. 05/04/22  Yes Jerline Pain, MD  atorvastatin (LIPITOR) 40 MG tablet TAKE 1 TABLET BY MOUTH AT BEDTIME 05/24/22  Yes [name], MD  Calcium Carbonate-Vitamin D (CALCIUM 600 + D PO) Take 1 tablet by mouth 2 (two) times daily.   Yes [provider]  cetirizine (ZYRTEC) 10 MG tablet Take 1 tablet (10 mg total) by mouth daily. 08/25/22  Yes [name], MD  ferrous sulfate 325 (65 FE) MG tablet Take 325 mg by mouth daily.   Yes [provider]  fluorometholone (FML) 0.1 % ophthalmic suspension Place 1 drop into both eyes every 30 (thirty) days.  10/20/16  Yes [provider]  furosemide (LASIX) 20 MG tablet Take 1 tablet (20 mg total) by mouth daily. 05/28/22  Yes [name], MD  latanoprost (XALATAN) 0.005 % ophthalmic solution Place 1 drop into both eyes at bedtime.  01/14/17  Yes [provider]  levothyroxine (SYNTHROID) 75 MCG tablet Take 1 tablet by mouth  once daily 07/21/22  Yes [name], MD  magnesium oxide (MAGOX 400) 400 (241.3 Mg) MG tablet Take 1 tablet (400 mg total) by mouth 2 (two) times daily. 11/14/20  Yes Dunn, Nedra Hai, PA-C  metFORMIN (GLUCOPHAGE) 1000 MG tablet Take 1 tablet by mouth twice daily 08/16/22  Yes [name], MD  metoprolol tartrate (LOPRESSOR) 100 MG tablet Take 1 tablet by mouth twice daily 08/16/22  Yes [name], MD  Multiple Vitamin (MULTIVITAMIN) capsule Take 1 capsule by mouth daily.   Yes [provider]  Omega-3 Fatty Acids (FISH OIL) 1200 MG CAPS Take 3 capsules by mouth daily.   Yes [provider]  tamsulosin (FLOMAX) 0.4 MG CAPS capsule Take 1 capsule (0.4 mg total)  by mouth daily. 07/12/22  Yes [name], MD  timolol (TIMOPTIC) 0.5 % ophthalmic solution Place 1 drop into both eyes daily.  10/19/16  Yes [provider]  triamcinolone ointment (KENALOG) 0.1 % Apply 1 Application topically 2 (two) times daily. Patient taking differently: Apply 1 Application topically 2 (two) times daily as needed (itching). 05/28/22 05/28/23 Yes [name], MD  warfarin (COUMADIN) 2.5 MG tablet TAKE 1 TO 1 & 1/2 (ONE TO ONE & ONE-HALF) TABLETS BY MOUTH ONCE DAILY AS  DIRECTED  BY  THE  COUMADIN  CLINIC Patient taking differently: Take 2.5 mg by mouth every evening. TAKE BY MOUTH ONCE DAILY AS DIRECTED BY THE COUMADIN CLINIC 03/09/22  Yes Jerline Pain, MD  diclofenac Sodium (VOLTAREN) 1 % GEL Apply 2 g topically 4 (four) times daily. Patient taking differently: Apply 2 g topically 4 (four) times daily as needed (pain). 12/14/19   [name], MD  Lancets [facility] ULTRASOFT) lancets Use as instructed 02/02/19   [name], MD  Na Sulfate-K Sulfate-Mg Sulf (SUPREP BOWEL PREP KIT) 17.5-3.13-1.6 GM/177ML SOLN Take 1 kit by mouth as directed. Patient not taking: Reported on 09/14/2022 08/02/22   Levin Erp, PA  [facility] VERIO test strip USE 1 STRIP TO CHECK GLUCOSE TWICE DAILY AS DIRECTED 09/14/22   [name], MD    Current Facility-Administered Medications  Medication Dose Route Frequency Provider Last Rate Last Admin   0.9 %  sodium chloride infusion   Intravenous Continuous Levin Erp, PA       lactated ringers infusion   Intravenous Continuous Ladene Artist, MD        Allergies as of 08/02/2022 - Review Complete 08/02/2022  Allergen Reaction Noted   Neomycin-bacitracin zn-polymyx Rash    Penicillins Other (See Comments) and Rash 05/21/2020   Enalapril  11/27/2020   Miconazole nitrate Nausea Only 06/28/2022   Sulfa antibiotics  12/10/2020   Other Rash 05/21/2020    Family History  Problem  Relation Age of Onset   Heart failure Mother    Heart failure Father    Stomach cancer Neg Hx    Colon cancer Neg Hx    Pancreatic cancer Neg Hx    Throat cancer Neg Hx     Social History   Socioeconomic History   Marital status: Divorced    Spouse name: Not on file   Number of children: Not on file   Years of education: Not on file   Highest education level: Not on file  Occupational History   Occupation: retired   Occupation: retired  Tobacco Use   Smoking status: Never   Smokeless tobacco: Never  Vaping Use   Vaping Use: Never used  Substance and Sexual Activity   Alcohol use: Not Currently    Comment: Very seldom---socially   Drug use: No   Sexual activity: Not Currently    Partners: Female  Other Topics Concern   Not on file  Social History Narrative   Volunteers at [employer] Strain: Low Risk  (08/26/2022)   Overall Financial Resource Strain (CARDIA)    Difficulty of Paying Living Expenses: Not hard at all  Food Insecurity: No Food Insecurity (08/26/2022)   Hunger Vital Sign    Worried About Running Out of Food in the Last Year: Never true    Ran Out of Food in the Last Year: Never true  Transportation Needs: No Transportation Needs (08/26/2022)   PRAPARE - Hydrologist (Medical): No    Lack of Transportation (Non-Medical): No  Physical Activity: Inactive (08/26/2022)   Exercise Vital Sign    Days of Exercise per Week: 0 days    Minutes of Exercise per Session: 0 min  Stress: No Stress Concern Present (08/26/2022)   [place]    Feeling of Stress : Not at all  Social Connections: Moderately Isolated (08/26/2022)   Social Connection and Isolation Panel [NHANES]    Frequency of Communication with Friends and Family: More than three times a week    Frequency of Social Gatherings with Friends and  Family: Three times a week    Attends Religious Services: Never    Active Member of Clubs or Organizations: Yes    Attends Archivist Meetings: More than 4 times per year    Marital Status: Divorced  Intimate Partner Violence: Not At Risk (08/26/2022)   Humiliation, Afraid, Rape, and Kick questionnaire    Fear of Current or Ex-Partner: No    Emotionally Abused: No    Physically Abused: No    Sexually Abused: No    Review of Systems:  All systems reviewed were negative except where noted in HPI.   Physical Exam: Vital signs in last 24 hours: Temp:  [97.7 F (36.5 C)] 97.7 F (36.5 C) (12/07 0931) Pulse Rate:  [73] 73 (12/07 0931) Resp:  [16] 16 (12/07 0931) BP: (123)/(70) 123/70 (12/07 0931) SpO2:  [100 %] 100 % (12/07 0931) Weight:  [106.1 kg] 106.1 kg (12/07 0931)   General:  Alert, well-developed, in NAD Head:  Normocephalic and atraumatic. Eyes:  Sclera clear, no icterus.   Conjunctiva pink. Ears:  Normal auditory acuity. Mouth:  No deformity or lesions.  Neck:  Supple; no masses . Lungs:  Clear throughout to auscultation.   No wheezes, crackles, or rhonchi. No acute distress. Heart:  Regular rate and rhythm; 3/6 harsh systolic murmur. Abdomen:  Soft, nondistended, nontender. No masses, hepatomegaly. No obvious masses.  Normal bowel sounds.    Rectal:  Deferred   Msk:  Symmetrical without gross deformities.. Pulses:  Normal pulses noted. Extremities:  Without edema. Neurologic:  Alert and  oriented x4;  grossly normal neurologically. Skin:  Intact without significant lesions or rashes. Psych:  Alert and cooperative. Normal mood and affect.   Impression / Plan:   Heme positive stool, anemia, weight loss, personal history of adenomatous colon polyps, chronic Coumadin anticoagulation and moderate to severe aortic stenosis for colonoscopy and EGD.  Pricilla Riffle. Fuller Plan  09/16/2022, 9:39 AM See Shea Evans, [facility], to contact our on call provider

## 2022-09-16 NOTE — Op Note (Signed)
[facility] Patient Name: Marvin Foster Procedure Date: 09/16/2022 MRN: [id number] Attending MD: Ladene Artist , MD, [id number] Date of Birth: Mar 16, 1934 CSN: [id number] Age: 88 Admit Type: Outpatient Procedure:                Upper GI endoscopy Indications:              Anemia, Heme positive stool Providers:                Marvin Riffle. Fuller Plan, MD, Marvin Cara, RN,                            Marvin Foster, Technician Referring MD:             Aundra Millet. Birdie Riddle, MD Medicines:                Monitored Anesthesia Care Complications:            No immediate complications. Estimated Blood Loss:     Estimated blood loss was minimal. Procedure:                Pre-Anesthesia Assessment:                           - Prior to the procedure, a History and Physical                            was performed, and patient medications and                            allergies were reviewed. The patient's tolerance of                            previous anesthesia was also reviewed. The risks                            and benefits of the procedure and the sedation                            options and risks were discussed with the patient.                            All questions were answered, and informed consent                            was obtained. Prior Anticoagulants: The patient has                            taken Coumadin (warfarin), last dose was 5 days                            prior to procedure. ASA Grade Assessment: III - A                            patient with severe systemic disease. After  reviewing the risks and benefits, the patient was                            deemed in satisfactory condition to undergo the                            procedure.                           After obtaining informed consent, the endoscope was                            passed under direct vision. Throughout the                             procedure, the patient's blood pressure, pulse, and                            oxygen saturations were monitored continuously. The                            [id number]-[id number] ([id number] Olympus endoscope was introduced                            through the mouth, and advanced to the second part                            of duodenum. The upper GI endoscopy was                            accomplished without difficulty. The patient                            tolerated the procedure well. Scope In: Scope Out: Findings:      The examined esophagus was normal.      Diffuse mild inflammation characterized by erythema, friability and       granularity was found in the entire examined stomach. Biopsies were       taken with a cold forceps for histology.      The exam of the stomach was otherwise normal.      Two small angioectasias without bleeding were found in the duodenal       bulb. Coagulation for bleeding prevention using argon plasma was       successful.      A 10 mm non-bleeding diverticulum was found in the area of the papilla.      The exam of the duodenum was otherwise normal. Impression:               - Normal esophagus.                           - Gastritis. Biopsied.                           - Two non-bleeding angioectasias in the duodenum.  Treated with argon plasma coagulation (APC).                           - Non-bleeding duodenal diverticulum. Moderate Sedation:      Not Applicable - Patient had care per Anesthesia. Recommendation:           - Patient has a contact number available for                            emergencies. The signs and symptoms of potential                            delayed complications were discussed with the                            patient. Return to normal activities tomorrow.                            Written discharge instructions were provided to the                            patient.                           - Resume  previous diet.                           - Continue present medications.                           - Resume Coumadin (warfarin) at prior dose in 2                            days. Refer to managing physician for further                            adjustment of therapy.                           - Await pathology results.                           - GI follow up with Dr. Hilarie Fredrickson. Procedure Code(s):        --- Professional ---                           08144, 5, Esophagogastroduodenoscopy, flexible,                            transoral; with control of bleeding, any method                           43239, Esophagogastroduodenoscopy, flexible,                            transoral; with biopsy, single or multiple Diagnosis Code(s):        ---  Professional ---                           K29.70, Gastritis, unspecified, without bleeding                           K31.819, Angiodysplasia of stomach and duodenum                            without bleeding                           D62, Acute posthemorrhagic anemia                           R19.5, Other fecal abnormalities                           K57.10, Diverticulosis of small intestine without                            perforation or abscess without bleeding CPT copyright 2022 American Medical Association. All rights reserved. The codes documented in this report are preliminary and upon coder review may  be revised to meet current compliance requirements. Ladene Artist, MD 09/16/2022 10:56:14 AM This report has been signed electronically. Number of Addenda: 0

## 2022-09-16 NOTE — Anesthesia Postprocedure Evaluation (Signed)
Anesthesia Post Note  Patient: JHAMIR PICKUP  Procedure(s) Performed: ESOPHAGOGASTRODUODENOSCOPY (EGD) WITH PROPOFOL COLONOSCOPY WITH PROPOFOL POLYPECTOMY BIOPSY HOT HEMOSTASIS (ARGON PLASMA COAGULATION/BICAP)     Patient location during evaluation: PACU Anesthesia Type: MAC Level of consciousness: awake Pain management: pain level controlled Vital Signs Assessment: post-procedure vital signs reviewed and stable Respiratory status: spontaneous breathing, nonlabored ventilation and respiratory function stable Cardiovascular status: stable and blood pressure returned to baseline Postop Assessment: no apparent nausea or vomiting Anesthetic complications: no   No notable events documented.  Last Vitals:  Vitals:   09/16/22 1100 09/16/22 1110  BP: (!) 127/56 (!) 120/97  Pulse: (!) 58 64  Resp: 14 (!) 21  Temp:    SpO2: 100% 98%    Last Pain:  Vitals:   09/16/22 1110  TempSrc:   PainSc: 0-No pain                 Nilda Simmer

## 2022-09-16 NOTE — Transfer of Care (Signed)
Immediate Anesthesia Transfer of Care Note  Patient: Marvin Foster  Procedure(s) Performed: ESOPHAGOGASTRODUODENOSCOPY (EGD) WITH PROPOFOL COLONOSCOPY WITH PROPOFOL POLYPECTOMY BIOPSY HOT HEMOSTASIS (ARGON PLASMA COAGULATION/BICAP)  Patient Location: PACU  Anesthesia Type:MAC  Level of Consciousness: awake, alert , and oriented  Airway & Oxygen Therapy: Patient Spontanous Breathing and Patient connected to face mask oxygen  Post-op Assessment: Report given to RN, Post -op Vital signs reviewed and stable, and Patient moving all extremities X 4  Post vital signs: Reviewed and stable  Last Vitals:  Vitals Value Taken Time  BP 134/50 09/16/22 1048  Temp    Pulse 67 09/16/22 1048  Resp 11 09/16/22 1048  SpO2 100     Last Pain:  Vitals:   09/16/22 1048  TempSrc:   PainSc: Asleep         Complications: No notable events documented.

## 2022-09-17 LAB — SURGICAL PATHOLOGY

## 2022-09-20 ENCOUNTER — Encounter (INDEPENDENT_AMBULATORY_CARE_PROVIDER_SITE_OTHER): Payer: Medicare HMO | Admitting: Ophthalmology

## 2022-09-20 ENCOUNTER — Encounter (HOSPITAL_COMMUNITY): Payer: Self-pay | Admitting: Gastroenterology

## 2022-09-20 DIAGNOSIS — I10 Essential (primary) hypertension: Secondary | ICD-10-CM

## 2022-09-20 DIAGNOSIS — H353221 Exudative age-related macular degeneration, left eye, with active choroidal neovascularization: Secondary | ICD-10-CM | POA: Diagnosis not present

## 2022-09-20 DIAGNOSIS — H43812 Vitreous degeneration, left eye: Secondary | ICD-10-CM

## 2022-09-20 DIAGNOSIS — H35032 Hypertensive retinopathy, left eye: Secondary | ICD-10-CM

## 2022-09-22 ENCOUNTER — Encounter: Payer: Self-pay | Admitting: Gastroenterology

## 2022-09-22 ENCOUNTER — Other Ambulatory Visit: Payer: Self-pay | Admitting: Family Medicine

## 2022-09-23 ENCOUNTER — Ambulatory Visit: Payer: Medicare HMO | Attending: Cardiology | Admitting: *Deleted

## 2022-09-23 DIAGNOSIS — I4891 Unspecified atrial fibrillation: Secondary | ICD-10-CM

## 2022-09-23 DIAGNOSIS — Z5181 Encounter for therapeutic drug level monitoring: Secondary | ICD-10-CM

## 2022-09-23 LAB — POCT INR: INR: 1.2 — AB (ref 2.0–3.0)

## 2022-09-23 NOTE — Patient Instructions (Addendum)
Description   *DOES NOT NEED A PRINTOUT* Today take 1.5 tablets of warfarin and tomorrow take 1.5 tablets of warfarin then continue taking 1 tablet daily. Refer to instructions for procedure. Repeat INR in 1 week. Call us with any medication changes or concerns # [phone] or [phone] [facility] # [phone].

## 2022-09-27 DIAGNOSIS — H401112 Primary open-angle glaucoma, right eye, moderate stage: Secondary | ICD-10-CM | POA: Diagnosis not present

## 2022-09-27 DIAGNOSIS — H401123 Primary open-angle glaucoma, left eye, severe stage: Secondary | ICD-10-CM | POA: Diagnosis not present

## 2022-09-27 DIAGNOSIS — H353221 Exudative age-related macular degeneration, left eye, with active choroidal neovascularization: Secondary | ICD-10-CM | POA: Diagnosis not present

## 2022-09-27 DIAGNOSIS — H53132 Sudden visual loss, left eye: Secondary | ICD-10-CM | POA: Diagnosis not present

## 2022-09-27 LAB — HM DIABETES EYE EXAM

## 2022-09-28 ENCOUNTER — Other Ambulatory Visit: Payer: Self-pay

## 2022-09-28 DIAGNOSIS — Z5181 Encounter for therapeutic drug level monitoring: Secondary | ICD-10-CM

## 2022-09-28 DIAGNOSIS — A048 Other specified bacterial intestinal infections: Secondary | ICD-10-CM

## 2022-09-28 MED ORDER — BISMUTH SUBSALICYLATE 262 MG PO TABS: 262.0000 mg | ORAL_TABLET | Freq: Four times a day (QID) | ORAL | 0 refills | Status: AC

## 2022-09-28 MED ORDER — DOXYCYCLINE HYCLATE 100 MG PO CAPS: 100.0000 mg | ORAL_CAPSULE | Freq: Two times a day (BID) | ORAL | 0 refills | Status: AC

## 2022-09-28 MED ORDER — METRONIDAZOLE 250 MG PO TABS: 250.0000 mg | ORAL_TABLET | Freq: Four times a day (QID) | ORAL | 0 refills | Status: AC

## 2022-09-28 MED ORDER — OMEPRAZOLE 40 MG PO CPDR: 40.0000 mg | DELAYED_RELEASE_CAPSULE | Freq: Two times a day (BID) | ORAL | 0 refills | Status: DC

## 2022-09-30 ENCOUNTER — Ambulatory Visit: Payer: Medicare HMO | Attending: Internal Medicine

## 2022-09-30 ENCOUNTER — Other Ambulatory Visit: Payer: Self-pay | Admitting: Optometry

## 2022-09-30 DIAGNOSIS — Z5181 Encounter for therapeutic drug level monitoring: Secondary | ICD-10-CM

## 2022-09-30 DIAGNOSIS — H53132 Sudden visual loss, left eye: Secondary | ICD-10-CM

## 2022-09-30 DIAGNOSIS — I4891 Unspecified atrial fibrillation: Secondary | ICD-10-CM

## 2022-09-30 LAB — POCT INR: INR: 2.2 (ref 2.0–3.0)

## 2022-09-30 NOTE — Patient Instructions (Addendum)
Description   *DOES NOT NEED A PRINTOUT* Continue taking 1 tablet daily (EXCEPT 0.5 tablet on Sunday since your on abx and eat 2 servings of greens)  Repeat INR on Tuesday, 10/05/22 Call us with any medication changes or concerns # [phone] or [phone] [facility] # [phone].

## 2022-10-05 ENCOUNTER — Ambulatory Visit: Payer: Medicare HMO | Attending: Cardiology

## 2022-10-05 DIAGNOSIS — Z5181 Encounter for therapeutic drug level monitoring: Secondary | ICD-10-CM

## 2022-10-05 DIAGNOSIS — I4891 Unspecified atrial fibrillation: Secondary | ICD-10-CM | POA: Diagnosis not present

## 2022-10-05 LAB — POCT INR: INR: 2.5 (ref 2.0–3.0)

## 2022-10-05 NOTE — Patient Instructions (Signed)
*  DOES NOT NEED A PRINTOUT* Continue taking 1 tablet daily.  Eat greens every other day until finished abx. Repeat INR in 2 weeks Call us with any medication changes or concerns # [phone] or [phone] [facility] # [phone].

## 2022-10-17 ENCOUNTER — Other Ambulatory Visit: Payer: Self-pay | Admitting: Family Medicine

## 2022-10-18 ENCOUNTER — Encounter (INDEPENDENT_AMBULATORY_CARE_PROVIDER_SITE_OTHER): Payer: Self-pay

## 2022-10-18 ENCOUNTER — Encounter (INDEPENDENT_AMBULATORY_CARE_PROVIDER_SITE_OTHER): Payer: Medicare HMO | Admitting: Ophthalmology

## 2022-10-19 ENCOUNTER — Other Ambulatory Visit (HOSPITAL_COMMUNITY): Payer: Medicare HMO

## 2022-10-20 ENCOUNTER — Ambulatory Visit: Payer: Medicare HMO | Attending: Cardiology

## 2022-10-20 ENCOUNTER — Other Ambulatory Visit: Payer: Self-pay | Admitting: Family Medicine

## 2022-10-20 DIAGNOSIS — I4891 Unspecified atrial fibrillation: Secondary | ICD-10-CM

## 2022-10-20 DIAGNOSIS — Z5181 Encounter for therapeutic drug level monitoring: Secondary | ICD-10-CM | POA: Diagnosis not present

## 2022-10-20 LAB — POCT INR: INR: 1.6 — AB (ref 2.0–3.0)

## 2022-10-20 NOTE — Patient Instructions (Signed)
Description   *DOES NOT NEED A PRINTOUT* Take 2 tablets today and then continue taking 1 tablet daily.  Repeat INR in 3 weeks Call us with any medication changes or concerns # [phone] or [phone] [facility] # [phone].

## 2022-10-27 ENCOUNTER — Encounter: Payer: Self-pay | Admitting: Podiatry

## 2022-10-27 ENCOUNTER — Ambulatory Visit (INDEPENDENT_AMBULATORY_CARE_PROVIDER_SITE_OTHER): Payer: Medicare HMO | Admitting: Podiatry

## 2022-10-27 DIAGNOSIS — B351 Tinea unguium: Secondary | ICD-10-CM | POA: Diagnosis not present

## 2022-10-27 DIAGNOSIS — M79675 Pain in left toe(s): Secondary | ICD-10-CM | POA: Diagnosis not present

## 2022-10-27 DIAGNOSIS — E119 Type 2 diabetes mellitus without complications: Secondary | ICD-10-CM

## 2022-10-27 DIAGNOSIS — D689 Coagulation defect, unspecified: Secondary | ICD-10-CM

## 2022-10-27 NOTE — Progress Notes (Signed)
This patient returns to my office for at risk foot care.  This patient requires this care by a professional since this patient will be at risk due to having coagulation defect and diabetes.   Patient is taking coumadin.  This patient is unable to cut nails himself since the patient cannot reach his nails.These nails are painful walking and wearing shoes.  He presents for his annual diabetic exam. This patient presents for at risk foot care today.    General Appearance  Alert, conversant and in no acute stress.  Vascular  Dorsalis pedis  pulses are  not palpable  Bilaterally due to swelling.[name] Kitchen Posterior tibial pulses are weakly palpable due to swelling from venous stasis.  Capillary return is within normal limits  bilaterally. Temperature is within normal limits  Bilaterally. Severe swelling and venous stasis lower legs  B/L.  Neurologic  Senn-Weinstein monofilament wire test within normal limits  bilaterally. Muscle power within normal limits bilaterally.  Nails Thick disfigured discolored nails with subungual debris  2,3 left foot. No evidence of bacterial infection or drainage bilaterally.  Orthopedic  No limitations of motion  feet .  No crepitus or effusions noted. HAV  B/L.  Skin  normotropic skin with no porokeratosis noted bilaterally.  No signs of infections or ulcers noted.     Onychomycosis    Pain in left toes  Diabetes with vascular pathology only.  Consent was obtained for treatment procedures.   Mechanical debridement of nails 1-5  bilaterally performed with a nail nipper.  Filed with dremel without incident.  Diabetic foot exam is to be done next visit.   Return office visit    6  months                 Told patient to return for periodic foot care and evaluation due to potential at risk complications.   Gardiner Barefoot DPM

## 2022-10-30 ENCOUNTER — Ambulatory Visit
Admission: RE | Admit: 2022-10-30 | Discharge: 2022-10-30 | Disposition: A | Discharge status: Home / Self Care | Payer: Medicare HMO | Source: Ambulatory Visit | Attending: *Deleted | Admitting: *Deleted

## 2022-10-30 DIAGNOSIS — H53132 Sudden visual loss, left eye: Secondary | ICD-10-CM

## 2022-10-30 DIAGNOSIS — H547 Unspecified visual loss: Secondary | ICD-10-CM | POA: Diagnosis not present

## 2022-10-30 MED ORDER — GADOPICLENOL 0.5 MMOL/ML IV SOLN
10.0000 mL | Freq: Once | INTRAVENOUS | Status: AC | PRN
  Administered 2022-10-30: 10 mL via INTRAVENOUS

## 2022-10-31 ENCOUNTER — Other Ambulatory Visit: Payer: Self-pay | Admitting: Cardiology

## 2022-11-05 ENCOUNTER — Other Ambulatory Visit: Payer: Self-pay | Admitting: Cardiology

## 2022-11-09 ENCOUNTER — Ambulatory Visit: Payer: Medicare HMO | Attending: Cardiology

## 2022-11-09 DIAGNOSIS — Z5181 Encounter for therapeutic drug level monitoring: Secondary | ICD-10-CM | POA: Diagnosis not present

## 2022-11-09 DIAGNOSIS — I4891 Unspecified atrial fibrillation: Secondary | ICD-10-CM | POA: Insufficient documentation

## 2022-11-09 DIAGNOSIS — I350 Nonrheumatic aortic (valve) stenosis: Secondary | ICD-10-CM | POA: Diagnosis not present

## 2022-11-10 ENCOUNTER — Ambulatory Visit: Payer: Medicare HMO

## 2022-11-10 DIAGNOSIS — I4891 Unspecified atrial fibrillation: Secondary | ICD-10-CM

## 2022-11-10 DIAGNOSIS — Z5181 Encounter for therapeutic drug level monitoring: Secondary | ICD-10-CM

## 2022-11-10 LAB — POCT INR: INR: 1.9 — AB (ref 2.0–3.0)

## 2022-11-10 LAB — ECHOCARDIOGRAM COMPLETE
AR max vel: 1.02 cm2
AV Area VTI: 0.9 cm2
AV Area mean vel: 0.88 cm2
AV Mean grad: 36.5 mmHg
AV Peak grad: 49.8 mmHg
Ao pk vel: 3.53 m/s
MV M vel: 5.21 m/s
MV Peak grad: 108.4 mmHg
S' Lateral: 3.3 cm

## 2022-11-10 NOTE — Patient Instructions (Signed)
Description   *DOES NOT NEED A PRINTOUT* START taking 1 tablet daily EXCEPT 1.5 tablets on Wednesdays.  Stay consistent with greens each week (3 per week)  Repeat INR in 3 weeks Call us with any medication changes or concerns # [phone] [facility]

## 2022-11-12 ENCOUNTER — Ambulatory Visit: Payer: Medicare HMO | Attending: Physician Assistant | Admitting: Physician Assistant

## 2022-11-12 ENCOUNTER — Encounter: Payer: Self-pay | Admitting: Physician Assistant

## 2022-11-12 VITALS — BP 110/60 | HR 79 | Ht 70.0 in | Wt 215.0 lb

## 2022-11-12 DIAGNOSIS — I350 Nonrheumatic aortic (valve) stenosis: Secondary | ICD-10-CM | POA: Diagnosis not present

## 2022-11-12 DIAGNOSIS — I10 Essential (primary) hypertension: Secondary | ICD-10-CM | POA: Diagnosis not present

## 2022-11-12 DIAGNOSIS — I4891 Unspecified atrial fibrillation: Secondary | ICD-10-CM

## 2022-11-12 DIAGNOSIS — E785 Hyperlipidemia, unspecified: Secondary | ICD-10-CM | POA: Diagnosis not present

## 2022-11-12 MED ORDER — FUROSEMIDE 20 MG PO TABS: 20.0000 mg | ORAL_TABLET | Freq: Every day | ORAL | 5 refills | Status: DC

## 2022-11-12 NOTE — Progress Notes (Signed)
Office Visit    Patient Name: Marvin Foster Date of Encounter: 11/12/2022  PCP:  [name], MD   [place]  Cardiologist:  Candee Furbish, MD  Advanced Practice Provider:  No care team member to display Electrophysiologist:  None   Chief Complaint    Marvin Foster is a 89 y.o. male with a past medical history significant for coronary artery disease with LAD stent, hypertension, permanent atrial fibrillation, hyperlipidemia presents today for follow-up appointment.  Proximal LAD stent was placed back on 03/01/2005.  Coumadin clinic sees him for atrial fibrillation and regulates his anticoagulation.  Overall, under good rate control with right bundle branch block it has been stable, heart rate was 66 at his appointment last year.  Last year he endorsed some leg pain especially knee pain from osteoarthritis.  He also shared that he lost his son in 2021 to [place].    Today, he comes in to review his echocardiogram.  His LVEF was 60 to 65%.  He had trivial mitral regurgitation with moderate annular calcification.  He had severe aortic stenosis with AVA 0.90 cm with aortic valve mean gradient of 36.5 mmHg.  He also had aortic dilatation measuring 40 mm.  We discussed findings and even though the patient is asymptomatic he would prefer to have his valve fixed at this time.  We discussed a referral to the structural heart team for further workup.  He also has permanent atrial fibrillation and is in A-fib today rate 79 bpm.  He does have a known right bundle branch block as well.  He has been tolerating medications and blood pressure is stable today.  He remains on Coumadin and follows up with the Coumadin clinic.  No issues with bleeding.  Reports no shortness of breath nor dyspnea on exertion. Reports no chest pain, pressure, or tightness. No edema, orthopnea, PND. Reports no palpitations.    Past Medical History    Past Medical History:  Diagnosis Date    Acute respiratory failure due to COVID-19 [facility]) 07/07/2020   ANEMIA 02/18/2010   Qualifier: Diagnosis of  By: Birdie Riddle MD, Belenda Cruise     Arthritis 12/2005   left hand   Arthritis    Left knee   ASPARTATE AMINOTRANSFERASE, SERUM, ELEVATED 11/04/2008   Qualifier: Diagnosis of  By: Birdie Riddle MD, Katherine     Atrial fibrillation ([place]) 07/24/2013   Blood transfusion without reported diagnosis    Bursitis of knee    right knee   CAD (coronary artery disease)    Cardiac arrhythmia due to congenital heart disease    Chronic anticoagulation 06/12/2015   Coagulation defect ([place]) 05/28/2020   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   CORONARY ARTERY DISEASE 03/27/2007   Qualifier: Diagnosis of  By: Cletus Gash MD, [place]     Diabetes mellitus    Diverticulosis    Glaucoma    left eye   Hx of colonic polyps 06/12/2015   Hyperlipidemia    Hypertension    Hypothyroid 09/21/2018   Long term current use of anticoagulant therapy 07/24/2013   Neuropathy    with pain   Pain due to onychomycosis of toenail of left foot 05/28/2020   Permanent atrial fibrillation ([facility])    RBBB    Scrotal mass 03/07/2012   Spinal stenosis 01/20/2005   Tubular adenoma of colon    Past Surgical History:  Procedure Laterality Date   BACK SURGERY     BIOPSY  09/16/2022   Procedure: BIOPSY;  Surgeon: Ladene Artist, MD;  Location: [facility];  Service: Gastroenterology;;   CATARACT EXTRACTION  08/11/09   right   COLONOSCOPY WITH PROPOFOL N/A 09/16/2022   Procedure: COLONOSCOPY WITH PROPOFOL;  Surgeon: Ladene Artist, MD;  Location: [facility];  Service: Gastroenterology;  Laterality: N/A;   corneal endothelial Left 2013   CORNEAL TRANSPLANT Left    eye   CORONARY ANGIOPLASTY WITH STENT PLACEMENT     ESOPHAGOGASTRODUODENOSCOPY (EGD) WITH PROPOFOL N/A 09/16/2022   Procedure: ESOPHAGOGASTRODUODENOSCOPY (EGD) WITH PROPOFOL;  Surgeon: Ladene Artist, MD;  Location: [facility];   Service: Gastroenterology;  Laterality: N/A;   EYE SURGERY     HOT HEMOSTASIS N/A 09/16/2022   Procedure: HOT HEMOSTASIS (ARGON PLASMA COAGULATION/BICAP);  Surgeon: Ladene Artist, MD;  Location: [facility];  Service: Gastroenterology;  Laterality: N/A;   POLYPECTOMY  09/16/2022   Procedure: POLYPECTOMY;  Surgeon: Ladene Artist, MD;  Location: [facility];  Service: Gastroenterology;;   SMALL INTESTINE SURGERY     SPINE SURGERY     VASECTOMY      Allergies  Allergies  Allergen Reactions   Neomycin-Bacitracin Zn-Polymyx Rash   Penicillins Other (See Comments) and Rash    Pt states this was diagnosed years ago, unsure as to reaction. Will not use.    Enalapril     Stopped 11/2020 due to hyperkalemia due to this   Miconazole Nitrate     Unsure of reaction    Sulfa Antibiotics     Pt not aware of allergy      EKGs/Labs/Other Studies Reviewed:   The following studies were reviewed today:  Echo 11/09/22  IMPRESSIONS     1. Left ventricular ejection fraction, by estimation, is 60 to 65%. The  left ventricle has normal function. The left ventricle has no regional  wall motion abnormalities. Left ventricular diastolic parameters are  indeterminate.   2. Right ventricular systolic function is normal. The right ventricular  size is normal. Tricuspid regurgitation signal is inadequate for assessing  PA pressure.   3. The mitral valve is degenerative. Trivial mitral valve regurgitation.  No evidence of mitral stenosis. Moderate mitral annular calcification with  bulky calcification in submitral apparatus.   4. The aortic valve is abnormal. There is severe calcifcation of the  aortic valve. Aortic valve regurgitation is not visualized. Moderate to  severe aortic valve stenosis. Aortic valve area, by VTI measures 0.90 cm.  Aortic valve mean gradient measures  36.5 mmHg. Aortic valve Vmax measures 3.53 m/s. DI 0.26. SVI 32 ml/m2.   5. Aortic dilatation noted. There is  borderline dilatation of the  ascending aorta, measuring 40 mm.   6. The inferior vena cava is dilated in size with >50% respiratory  variability, suggesting right atrial pressure of 8 mmHg.   FINDINGS   Left Ventricle: Left ventricular ejection fraction, by estimation, is 60  to 65%. The left ventricle has normal function. The left ventricle has no  regional wall motion abnormalities. The left ventricular internal cavity  size was normal in size. There is   borderline left ventricular hypertrophy. Left ventricular diastolic  parameters are indeterminate.   Right Ventricle: The right ventricular size is normal. No increase in  right ventricular wall thickness. Right ventricular systolic function is  normal. Tricuspid regurgitation signal is inadequate for assessing PA  pressure.   Left Atrium: Left atrial size was normal in size.   Right Atrium: Right atrial size was normal in size.  Pericardium: There is no evidence of pericardial effusion.   Mitral Valve: The mitral valve is degenerative in appearance. Moderate  mitral annular calcification. Trivial mitral valve regurgitation. No  evidence of mitral valve stenosis.   Tricuspid Valve: The tricuspid valve is normal in structure. Tricuspid  valve regurgitation is trivial. No evidence of tricuspid stenosis.   Aortic Valve: The aortic valve is abnormal. There is severe calcifcation  of the aortic valve. Aortic valve regurgitation is not visualized.  Moderate to severe aortic stenosis is present. Aortic valve mean gradient  measures 36.5 mmHg. Aortic valve peak  gradient measures 49.8 mmHg. Aortic valve area, by VTI measures 0.90 cm.   Pulmonic Valve: The pulmonic valve was normal in structure. Pulmonic valve  regurgitation is not visualized. No evidence of pulmonic stenosis.   Aorta: Aortic dilatation noted. There is borderline dilatation of the  ascending aorta, measuring 40 mm.   Venous: The inferior vena cava is dilated  in size with greater than 50%  respiratory variability, suggesting right atrial pressure of 8 mmHg.   IAS/Shunts: No atrial level shunt detected by color flow Doppler.       EKG:  EKG is  ordered today.  The ekg ordered today demonstrates atrial fibrillation, rate 79 bpm with known right bundle branch block.  Recent Labs: 05/28/2022: ALT 17; BUN 18; Creat 1.06; Potassium 5.1; Sodium 140; TSH 2.43 08/02/2022: Hemoglobin 12.2; Platelets 158.0  Recent Lipid Panel    Component Value Date/Time   CHOL 97 05/28/2022 1402   TRIG 115 05/28/2022 1402   HDL 30 (L) 05/28/2022 1402   CHOLHDL 3.2 05/28/2022 1402   VLDL 31.6 12/10/2021 0920   LDLCALC 47 05/28/2022 1402   LDLDIRECT 52.0 12/30/2020 1337    Risk Assessment/Calculations:   CHA2DS2-VASc Score = 5   This indicates a 7.2% annual risk of stroke. The patient's score is based upon: CHF History: 0 HTN History: 1 Diabetes History: 1 Stroke History: 0 Vascular Disease History: 1 Age Score: 2 Gender Score: 0    Home Medications   Current Meds  Medication Sig   amLODipine (NORVASC) 2.5 MG tablet Take 1 tablet by mouth once daily. Please Keep appt.with Cardiologist in Feb.for future refills.   atorvastatin (LIPITOR) 40 MG tablet TAKE 1 TABLET BY MOUTH AT BEDTIME   Calcium Carbonate-Vitamin D (CALCIUM 600 + D PO) Take 1 tablet by mouth 2 (two) times daily.   cetirizine (ZYRTEC) 10 MG tablet Take 1 tablet (10 mg total) by mouth daily.   diclofenac Sodium (VOLTAREN) 1 % GEL Apply 2 g topically 4 (four) times daily. (Patient taking differently: Apply 2 g topically 4 (four) times daily as needed (pain).)   ferrous sulfate 325 (65 FE) MG tablet Take 325 mg by mouth daily.   fluorometholone (FML) 0.1 % ophthalmic suspension Place 1 drop into both eyes every 30 (thirty) days.    Lancets (ONETOUCH ULTRASOFT) lancets Use as instructed   latanoprost (XALATAN) 0.005 % ophthalmic solution Place 1 drop into both eyes at bedtime.     levothyroxine (SYNTHROID) 75 MCG tablet Take 1 tablet by mouth once daily   magnesium oxide (MAGOX 400) 400 (241.3 Mg) MG tablet Take 1 tablet (400 mg total) by mouth 2 (two) times daily.   metFORMIN (GLUCOPHAGE) 1000 MG tablet Take 1 tablet by mouth twice daily   metoprolol tartrate (LOPRESSOR) 100 MG tablet Take 1 tablet by mouth twice daily   Multiple Vitamin (MULTIVITAMIN) capsule Take 1 capsule by mouth daily.   Omega-3  Fatty Acids (FISH OIL) 1200 MG CAPS Take 3 capsules by mouth daily.   ONETOUCH VERIO test strip USE 1 STRIP TO CHECK GLUCOSE TWICE DAILY AS DIRECTED   tamsulosin (FLOMAX) 0.4 MG CAPS capsule Take 1 capsule (0.4 mg total) by mouth daily.   timolol (TIMOPTIC) 0.5 % ophthalmic solution Place 1 drop into both eyes daily.    triamcinolone ointment (KENALOG) 0.1 % Apply 1 Application topically 2 (two) times daily. (Patient taking differently: Apply 1 Application topically 2 (two) times daily as needed (itching).)   warfarin (COUMADIN) 2.5 MG tablet TAKE ONE TO ONE AND ONE HALF TABLETS BY MOUTH ONCE DAILY AS DIRECTED BY COUMADIN CLINIC   [DISCONTINUED] furosemide (LASIX) 20 MG tablet Take 1 tablet by mouth once daily     Review of Systems      All other systems reviewed and are otherwise negative except as noted above.  Physical Exam    VS:  BP 110/60   Pulse 79   Ht '5\' 10"'$  (1.778 m)   Wt 215 lb (97.5 kg)   SpO2 97%   BMI 30.85 kg/m  , BMI Body mass index is 30.85 kg/m.  Wt Readings from Last 3 Encounters:  11/12/22 215 lb (97.5 kg)  09/16/22 234 lb (106.1 kg)  08/25/22 220 lb 8 oz (100 kg)     GEN: Well nourished, well developed, in no acute distress. HEENT: normal. Neck: Supple, no JVD, carotid bruits, or masses. Cardiac: irregularly irregular, no murmurs, rubs, or gallops. No clubbing, cyanosis, edema.  Radials/PT 2+ and equal bilaterally.  Respiratory:  Respirations regular and unlabored, clear to auscultation bilaterally. GI: Soft, nontender,  nondistended. MS: No deformity or atrophy. Skin: Warm and dry, no rash. Neuro:  Strength and sensation are intact. Psych: Normal affect.  Assessment & Plan    Severe Aortic stenosis -Echocardiogram reviewed and TAVR procedure explained -Patient would like to go forward with a consult to the structural heart team -He is asymptomatic at this time and denies any dizziness, lightheadedness, shortness of breath, syncope, or presyncope -We recommended continuing current medications including amlodipine 2.5 mg daily, Lipitor 40 mg daily, Lasix 20 mg daily, Lopressor 100 mg twice daily, fish oil 3600 mg daily, and Coumadin 2.5 mg (as directed by the Coumadin clinic)   Atrial fibrillation -rate controlled today -asymomatic  -continue current medication regimen  Coronary artery disease -no chest pains or sob -Continue current medications   Chronic anticoagulation -2.'5mg'$  coumadin he follows with the coumadin clinic   Leg pain (ABIs checked 2023 with mild disease) -left side leg pain -he needs a knee replacement and have done everything to avoid it including injections and [employer]         Disposition: Follow up 3-6 months with Candee Furbish, MD or APP.  Signed, Elgie Collard, PA-C 11/12/2022, 4:19 PM [facility]

## 2022-11-12 NOTE — Patient Instructions (Addendum)
Medication Instructions:  1.Take an extra lasix 20 mg as needed for lower extremity swelling *If you need a refill on your cardiac medications before your next appointment, please call your pharmacy*   Lab Work: None ordered If you have labs (blood work) drawn today and your tests are completely normal, you will receive your results only by: [place] (if you have MyChart) OR A paper copy in the mail If you have any lab test that is abnormal or we need to change your treatment, we will call you to review the results.   Follow-Up: At [facility], you and your health needs are our priority.  As part of our continuing mission to provide you with exceptional heart care, we have created designated Provider Care Teams.  These Care Teams include your primary Cardiologist (physician) and Advanced Practice Providers (APPs -  Physician Assistants and Nurse Practitioners) who all work together to provide you with the care you need, when you need it.  Your next appointment:   3-6 month(s)  Provider:   Candee Furbish, MD    Other Instructions You have been referred to the structural heart clinic here in this office.

## 2022-11-17 DIAGNOSIS — E669 Obesity, unspecified: Secondary | ICD-10-CM | POA: Diagnosis not present

## 2022-11-17 DIAGNOSIS — E785 Hyperlipidemia, unspecified: Secondary | ICD-10-CM | POA: Diagnosis not present

## 2022-11-17 DIAGNOSIS — K219 Gastro-esophageal reflux disease without esophagitis: Secondary | ICD-10-CM | POA: Diagnosis not present

## 2022-11-17 DIAGNOSIS — I4891 Unspecified atrial fibrillation: Secondary | ICD-10-CM | POA: Diagnosis not present

## 2022-11-17 DIAGNOSIS — R600 Localized edema: Secondary | ICD-10-CM | POA: Diagnosis not present

## 2022-11-17 DIAGNOSIS — E1142 Type 2 diabetes mellitus with diabetic polyneuropathy: Secondary | ICD-10-CM | POA: Diagnosis not present

## 2022-11-17 DIAGNOSIS — H409 Unspecified glaucoma: Secondary | ICD-10-CM | POA: Diagnosis not present

## 2022-11-17 DIAGNOSIS — I10 Essential (primary) hypertension: Secondary | ICD-10-CM | POA: Diagnosis not present

## 2022-11-17 DIAGNOSIS — I2510 Atherosclerotic heart disease of native coronary artery without angina pectoris: Secondary | ICD-10-CM | POA: Diagnosis not present

## 2022-11-17 DIAGNOSIS — R269 Unspecified abnormalities of gait and mobility: Secondary | ICD-10-CM | POA: Diagnosis not present

## 2022-11-17 DIAGNOSIS — E039 Hypothyroidism, unspecified: Secondary | ICD-10-CM | POA: Diagnosis not present

## 2022-11-17 DIAGNOSIS — M1990 Unspecified osteoarthritis, unspecified site: Secondary | ICD-10-CM | POA: Diagnosis not present

## 2022-11-17 DIAGNOSIS — Z008 Encounter for other general examination: Secondary | ICD-10-CM | POA: Diagnosis not present

## 2022-11-20 ENCOUNTER — Other Ambulatory Visit: Payer: Self-pay | Admitting: Family Medicine

## 2022-11-22 ENCOUNTER — Ambulatory Visit: Payer: Medicare HMO | Attending: Cardiovascular Disease | Admitting: Cardiovascular Disease

## 2022-11-22 ENCOUNTER — Encounter: Payer: Self-pay | Admitting: Cardiovascular Disease

## 2022-11-22 VITALS — BP 118/68 | HR 66 | Ht 70.0 in | Wt 216.6 lb

## 2022-11-22 DIAGNOSIS — I350 Nonrheumatic aortic (valve) stenosis: Secondary | ICD-10-CM | POA: Diagnosis not present

## 2022-11-22 NOTE — Patient Instructions (Signed)
Medication Instructions:  No changes *If you need a refill on your cardiac medications before your next appointment, please call your pharmacy*   Lab Work: none If you have labs (blood work) drawn today and your tests are completely normal, you will receive your results only by: [place] (if you have MyChart) OR A paper copy in the mail If you have any lab test that is abnormal or we need to change your treatment, we will call you to review the results.   Testing/Procedures: have echo before next visit Your physician has requested that you have an echocardiogram. Echocardiography is a painless test that uses sound waves to create images of your heart. It provides your doctor with information about the size and shape of your heart and how well your heart's chambers and valves are working. This procedure takes approximately one hour. There are no restrictions for this procedure. Please do NOT wear cologne, perfume, aftershave, or lotions (deodorant is allowed). Please arrive 15 minutes prior to your appointment time.   Follow-Up: At [facility], you and your health needs are our priority.  As part of our continuing mission to provide you with exceptional heart care, we have created designated Provider Care Teams.  These Care Teams include your primary Cardiologist (physician) and Advanced Practice Providers (APPs -  Physician Assistants and Nurse Practitioners) who all work together to provide you with the care you need, when you need it.   Your next appointment:   6 month(s) -  needs echo prior  Provider:   Lauree Chandler, MD  Other Instructions

## 2022-11-22 NOTE — Progress Notes (Signed)
[facility] Consult Note  Chief Complaint  Patient presents with   New Patient (Initial Visit)    Aortic stenosis   History of Present Illness:89 yo Marvin Foster with history of persistent atrial fibrillation, CAD, DM, hyperlipidemia, HTN, hypothyroidism and RBBB who is here today as a new consult, referred by Dr. Marlou Porch, for further discussion regarding his aortic stenosis and possible TAVR. He has persistent atrial fibrillation and is on coumadin. He has CAD and had an LAD stent placed in 2006. He has been followed for moderate aortic stenosis by Dr. Marlou Porch. Echo January 2024 with LVEF=60-65%. Severe aortic stenosis with mean gradient 36.5 mmHg, AVA 0.9 cm2, DI 0.26, SVI 32.   He tells me today that he has been feeling well. He denies chest pain, dyspnea, near syncope or syncope. He has chronic LE edema. He lives in [place] alone.  He is retired from [employer]. He has dentures and no active dental issues.   Primary Care Physician: [name], MD Primary Cardiologist: Marlou Porch Referring Cardiologist: Marlou Porch  Past Medical History:  Diagnosis Date   Acute respiratory failure due to COVID-19 [facility]) 07/07/2020   ANEMIA 02/18/2010   Qualifier: Diagnosis of  By: Birdie Riddle MD, Belenda Cruise     Arthritis 12/2005   left hand   Arthritis    Left knee   ASPARTATE AMINOTRANSFERASE, SERUM, ELEVATED 11/04/2008   Qualifier: Diagnosis of  By: Birdie Riddle MD, Katherine     Atrial fibrillation ([place]) 07/24/2013   Blood transfusion without reported diagnosis    Bursitis of knee    right knee   CAD (coronary artery disease)    Cardiac arrhythmia due to congenital heart disease    Chronic anticoagulation 06/12/2015   Coagulation defect ([place]) 05/28/2020   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   CORONARY ARTERY DISEASE 03/27/2007   Qualifier: Diagnosis of  By: Cletus Gash MD, [place]     Diabetes mellitus    Diverticulosis    Glaucoma    left eye   Hx of  colonic polyps 06/12/2015   Hyperlipidemia    Hypertension    Hypothyroid 09/21/2018   Long term current use of anticoagulant therapy 07/24/2013   Neuropathy    with pain   Pain due to onychomycosis of toenail of left foot 05/28/2020   Permanent atrial fibrillation (HCC)    RBBB    Scrotal mass 03/07/2012   Spinal stenosis 01/20/2005   Tubular adenoma of colon     Past Surgical History:  Procedure Laterality Date   BACK SURGERY     BIOPSY  09/16/2022   Procedure: BIOPSY;  Surgeon: Ladene Artist, MD;  Location: [facility];  Service: Gastroenterology;;   CATARACT EXTRACTION  08/11/2009   right   COLONOSCOPY WITH PROPOFOL N/A 09/16/2022   Procedure: COLONOSCOPY WITH PROPOFOL;  Surgeon: Ladene Artist, MD;  Location: [facility];  Service: Gastroenterology;  Laterality: N/A;   corneal endothelial Left 2013   CORNEAL TRANSPLANT Left    eye   CORONARY ANGIOPLASTY WITH STENT PLACEMENT     ESOPHAGOGASTRODUODENOSCOPY (EGD) WITH PROPOFOL N/A 09/16/2022   Procedure: ESOPHAGOGASTRODUODENOSCOPY (EGD) WITH PROPOFOL;  Surgeon: Ladene Artist, MD;  Location: [facility];  Service: Gastroenterology;  Laterality: N/A;   EYE SURGERY     HOT HEMOSTASIS N/A 09/16/2022   Procedure: HOT HEMOSTASIS (ARGON PLASMA COAGULATION/BICAP);  Surgeon: Ladene Artist, MD;  Location: [facility];  Service: Gastroenterology;  Laterality: N/A;   POLYPECTOMY  09/16/2022   Procedure:  POLYPECTOMY;  Surgeon: Ladene Artist, MD;  Location: [facility];  Service: Gastroenterology;;   ROTATOR CUFF REPAIR     SMALL INTESTINE SURGERY     SPINE SURGERY      Current Outpatient Medications  Medication Sig Dispense Refill   amLODipine (NORVASC) 2.5 MG tablet Take 1 tablet by mouth once daily. Please Keep appt.with Cardiologist in Feb.for future refills. 90 tablet 0   atorvastatin (LIPITOR) 40 MG tablet TAKE 1 TABLET BY MOUTH AT BEDTIME 90 tablet 0   Calcium Carbonate-Vitamin D (CALCIUM 600 + D PO) Take 1  tablet by mouth 2 (two) times daily.     cetirizine (ZYRTEC) 10 MG tablet Take 1 tablet (10 mg total) by mouth daily. 30 tablet 11   diclofenac Sodium (VOLTAREN) 1 % GEL Apply 2 g topically 4 (four) times daily. (Patient taking differently: Apply 2 g topically 4 (four) times daily as needed (pain).) 100 g 1   ferrous sulfate 325 (65 FE) MG tablet Take 325 mg by mouth daily.     fluorometholone (FML) 0.1 % ophthalmic suspension Place 1 drop into both eyes every 30 (thirty) days.      furosemide (LASIX) 20 MG tablet Take 1 tablet (20 mg total) by mouth daily. May take an extra 20 mg as needed for lower extremity swelling 45 tablet 5   Lancets (ONETOUCH ULTRASOFT) lancets Use as instructed 100 each 12   latanoprost (XALATAN) 0.005 % ophthalmic solution Place 1 drop into both eyes at bedtime.      levothyroxine (SYNTHROID) 75 MCG tablet Take 1 tablet by mouth once daily 90 tablet 0   magnesium oxide (MAGOX 400) 400 (241.3 Mg) MG tablet Take 1 tablet (400 mg total) by mouth 2 (two) times daily. 60 tablet 11   metFORMIN (GLUCOPHAGE) 1000 MG tablet Take 1 tablet by mouth twice daily 180 tablet 0   metoprolol tartrate (LOPRESSOR) 100 MG tablet Take 1 tablet by mouth twice daily 180 tablet 0   Multiple Vitamin (MULTIVITAMIN) capsule Take 1 capsule by mouth daily.     Omega-3 Fatty Acids (FISH OIL) 1200 MG CAPS Take 3 capsules by mouth daily.     ONETOUCH VERIO test strip USE 1 STRIP TO CHECK GLUCOSE TWICE DAILY AS DIRECTED 100 each 0   tamsulosin (FLOMAX) 0.4 MG CAPS capsule Take 1 capsule by mouth once daily 30 capsule 0   timolol (TIMOPTIC) 0.5 % ophthalmic solution Place 1 drop into both eyes daily.      triamcinolone ointment (KENALOG) 0.1 % Apply 1 Application topically 2 (two) times daily. (Patient taking differently: Apply 1 Application topically 2 (two) times daily as needed (itching).) 90 g 1   warfarin (COUMADIN) 2.5 MG tablet TAKE ONE TO ONE AND ONE HALF TABLETS BY MOUTH ONCE DAILY AS DIRECTED BY  COUMADIN CLINIC 120 tablet 0   omeprazole (PRILOSEC) 40 MG capsule Take 1 capsule (40 mg total) by mouth 2 (two) times daily for 14 days. 28 capsule 0   No current facility-administered medications for this visit.    Allergies  Allergen Reactions   Neomycin-Bacitracin Zn-Polymyx Rash   Penicillins Other (See Comments) and Rash    Pt states this was diagnosed years ago, unsure as to reaction. Will not use.    Enalapril     Stopped 11/2020 due to hyperkalemia due to this   Miconazole Nitrate     Unsure of reaction    Sulfa Antibiotics     Pt not aware of allergy  Social History   Socioeconomic History   Marital status: Divorced    Spouse name: Not on file   Number of children: 5   Years of education: Not on file   Highest education level: Not on file  Occupational History   Occupation: retired   Occupation: retired   Occupation: Retired from [employer] Use   Smoking status: Never   Smokeless tobacco: Never  Scientific laboratory technician Use: Never used  Substance and Sexual Activity   Alcohol use: Not Currently    Comment: Very seldom---socially   Drug use: No   Sexual activity: Not Currently    Partners: Female  Other Topics Concern   Not on file  Social History Narrative   Volunteers at [employer] Strain: [place]  (08/26/2022)   Overall Financial Resource Strain (CARDIA)    Difficulty of Paying Living Expenses: Not hard at all  Food Insecurity: No Food Insecurity (08/26/2022)   Hunger Vital Sign    Worried About Running Out of Food in the Last Year: Never true    [place] in the Last Year: Never true  Transportation Needs: No Transportation Needs (08/26/2022)   PRAPARE - Hydrologist (Medical): No    Lack of Transportation (Non-Medical): No  Physical Activity: Inactive (08/26/2022)   Exercise Vital Sign    Days of Exercise per Week: 0 days    Minutes of  Exercise per Session: 0 min  Stress: No Stress Concern Present (08/26/2022)   [place]    Feeling of Stress : Not at all  Social Connections: Moderately Isolated (08/26/2022)   Social Connection and Isolation Panel [NHANES]    Frequency of Communication with Friends and Family: More than three times a week    Frequency of Social Gatherings with Friends and Family: Three times a week    Attends Religious Services: Never    Active Member of Clubs or Organizations: Yes    Attends Archivist Meetings: More than 4 times per year    Marital Status: Divorced  Intimate Partner Violence: Not At Risk (08/26/2022)   Humiliation, Afraid, Rape, and Kick questionnaire    Fear of Current or Ex-Partner: No    Emotionally Abused: No    Physically Abused: No    Sexually Abused: No    Family History  Problem Relation Age of Onset   Heart failure Mother        Heart attack age 59   Heart failure Father        Heart attack age 73   Stomach cancer Neg Hx    Colon cancer Neg Hx    Pancreatic cancer Neg Hx    Throat cancer Neg Hx     Review of Systems:  As stated in the HPI and otherwise negative.   BP 118/68   Pulse 66   Ht 5' 10"$  (1.778 m)   Wt 98.2 kg   SpO2 98%   BMI 31.08 kg/m   Physical Examination: General: Well developed, well nourished, NAD  HEENT: OP clear, mucus membranes moist  SKIN: warm, dry. No rashes. Neuro: No focal deficits  Musculoskeletal: Muscle strength 5/5 all ext  Psychiatric: Mood and affect normal  Neck: No JVD, no carotid bruits, no thyromegaly, no lymphadenopathy.  Lungs:Clear bilaterally, no wheezes, rhonci, crackles Cardiovascular: Regular rate  and rhythm. Loud, harsh, late peaking systolic murmur.  Abdomen:Soft. Bowel sounds present. Non-tender.  Extremities: Trace to 1+ bilateral lower extremity edema.   EKG:  EKG is not ordered today. The ekg ordered today demonstrates    Echo 11/09/22: 1. Left ventricular ejection fraction, by estimation, is 60 to 65%. The  left ventricle has normal function. The left ventricle has no regional  wall motion abnormalities. Left ventricular diastolic parameters are  indeterminate.   2. Right ventricular systolic function is normal. The right ventricular  size is normal. Tricuspid regurgitation signal is inadequate for assessing  PA pressure.   3. The mitral valve is degenerative. Trivial mitral valve regurgitation.  No evidence of mitral stenosis. Moderate mitral annular calcification with  bulky calcification in submitral apparatus.   4. The aortic valve is abnormal. There is severe calcifcation of the  aortic valve. Aortic valve regurgitation is not visualized. Moderate to  severe aortic valve stenosis. Aortic valve area, by VTI measures 0.90 cm.  Aortic valve mean gradient measures  36.5 mmHg. Aortic valve Vmax measures 3.53 m/s. DI 0.26. SVI 32 ml/m2.   5. Aortic dilatation noted. There is borderline dilatation of the  ascending aorta, measuring 40 mm.   6. The inferior vena cava is dilated in size with >50% respiratory  variability, suggesting right atrial pressure of 8 mmHg.   FINDINGS   Left Ventricle: Left ventricular ejection fraction, by estimation, is 60  to 65%. The left ventricle has normal function. The left ventricle has no  regional wall motion abnormalities. The left ventricular internal cavity  size was normal in size. There is   borderline left ventricular hypertrophy. Left ventricular diastolic  parameters are indeterminate.   Right Ventricle: The right ventricular size is normal. No increase in  right ventricular wall thickness. Right ventricular systolic function is  normal. Tricuspid regurgitation signal is inadequate for assessing PA  pressure.   Left Atrium: Left atrial size was normal in size.   Right Atrium: Right atrial size was normal in size.   Pericardium: There is no evidence of  pericardial effusion.   Mitral Valve: The mitral valve is degenerative in appearance. Moderate  mitral annular calcification. Trivial mitral valve regurgitation. No  evidence of mitral valve stenosis.   Tricuspid Valve: The tricuspid valve is normal in structure. Tricuspid  valve regurgitation is trivial. No evidence of tricuspid stenosis.   Aortic Valve: The aortic valve is abnormal. There is severe calcifcation  of the aortic valve. Aortic valve regurgitation is not visualized.  Moderate to severe aortic stenosis is present. Aortic valve mean gradient  measures 36.5 mmHg. Aortic valve peak  gradient measures 49.8 mmHg. Aortic valve area, by VTI measures 0.90 cm.   Pulmonic Valve: The pulmonic valve was normal in structure. Pulmonic valve  regurgitation is not visualized. No evidence of pulmonic stenosis.   Aorta: Aortic dilatation noted. There is borderline dilatation of the  ascending aorta, measuring 40 mm.   Venous: The inferior vena cava is dilated in size with greater than 50%  respiratory variability, suggesting right atrial pressure of 8 mmHg.   IAS/Shunts: No atrial level shunt detected by color flow Doppler.     LEFT VENTRICLE  PLAX 2D  LVIDd:         4.90 cm  LVIDs:         3.30 cm  LV PW:         1.00 cm  LV IVS:        1.00 cm  LVOT diam:     2.10 cm  LV SV:         72  LV SV Index:   32  LVOT Area:     3.46 cm     RIGHT VENTRICLE             IVC  RV Basal diam:  4.40 cm     IVC diam: 1.70 cm  RV S prime:     12.07 cm/s  TAPSE (M-mode): 2.2 cm   LEFT ATRIUM             Index        RIGHT ATRIUM           Index  LA diam:        5.60 cm 2.51 cm/m   RA Area:     16.20 cm  LA Vol (A2C):   46.1 ml 20.66 ml/m  RA Volume:   45.70 ml  20.48 ml/m  LA Vol (A4C):   37.2 ml 16.67 ml/m  LA Biplane Vol: 43.2 ml 19.36 ml/m   AORTIC VALVE  AV Area (Vmax):    1.02 cm  AV Area (Vmean):   0.88 cm  AV Area (VTI):     0.90 cm  AV Vmax:           353.00 cm/s   AV Vmean:          269.200 cm/s  AV VTI:            0.799 m  AV Peak Grad:      49.8 mmHg  AV Mean Grad:      36.5 mmHg  LVOT Vmax:         104.00 cm/s  LVOT Vmean:        68.700 cm/s  LVOT VTI:          0.207 m  LVOT/AV VTI ratio: 0.26    AORTA  Ao Root diam: 3.70 cm  Ao Asc diam:  4.00 cm   MR Peak grad: 108.4 mmHg  MR Mean grad: Marvin.0 mmHg     SHUNTS  MR Vmax:      520.50 cm/s   Systemic VTI:  0.21 m  MR Vmean:     452.5 cm/s    Systemic Diam: 2.10 cm  MV E velocity: 113.67 cm/s   Recent Labs: 05/28/2022: ALT 17; BUN 18; Creat 1.06; Potassium 5.1; Sodium 140; TSH 2.43 08/02/2022: Hemoglobin 12.2; Platelets 158.0    Wt Readings from Last 3 Encounters:  11/22/22 98.2 kg  11/12/22 97.5 kg  09/16/22 106.1 kg     Assessment and Plan:   1. Severe Aortic Valve Stenosis: He has severe stage C aortic valve stenosis. I have personally reviewed the echo images.  The aortic valve is thickened, calcified with limited leaflet mobility. I think he would benefit from AVR. Given advanced age, he is not a good candidate for conventional AVR by surgical approach. I think he may be a good candidate for TAVR when he becomes symptomatic. NYHA class 2.  I have reviewed the natural history of aortic stenosis with the patient and their family members  who are present today. We have discussed the limitations of medical therapy and the poor prognosis associated with symptomatic aortic stenosis. We have reviewed potential treatment options, including palliative medical therapy, conventional surgical aortic valve replacement, and transcatheter aortic valve replacement. We discussed treatment options in the context of the patient's specific comorbid medical conditions.   We will  follow his AS for now with a repeat echo in 6 months. He will call back if he develops any concerning symptoms.   Labs/ tests ordered today include:   Orders Placed This Encounter  Procedures   ECHOCARDIOGRAM COMPLETE    Disposition:   F/U with the valve team.    Signed, Lauree Chandler, MD, [facility] 11/22/2022 1:26 PM    [facility] [address], [place], [place]  [address] Phone: [phone]; Fax: [phone]

## 2022-11-25 ENCOUNTER — Telehealth: Payer: Self-pay | Admitting: Family Medicine

## 2022-11-25 ENCOUNTER — Other Ambulatory Visit: Payer: Self-pay

## 2022-11-25 MED ORDER — TAMSULOSIN HCL 0.4 MG PO CAPS: 0.4000 mg | ORAL_CAPSULE | Freq: Every day | ORAL | 3 refills | Status: DC

## 2022-11-25 NOTE — Telephone Encounter (Signed)
Refill sent in

## 2022-11-25 NOTE — Telephone Encounter (Signed)
Encourage patient to contact the pharmacy for refills or they can request refills through [facility]  (Please schedule appointment if patient has not been seen in over a year)    WHAT [place] TO: [facility] on [address] NAME & DOSE:  tamsulosin (FLOMAX) 0.4 MG  NOTES/COMMENTS FROM PATIENT:      [place] office please notify patient: It takes 48-72 hours to process rx refill requests Ask patient to call pharmacy to ensure rx is ready before heading there.

## 2022-11-27 ENCOUNTER — Other Ambulatory Visit: Payer: Self-pay | Admitting: Family Medicine

## 2022-12-01 ENCOUNTER — Ambulatory Visit: Payer: Medicare HMO | Attending: Cardiology

## 2022-12-01 DIAGNOSIS — Z5181 Encounter for therapeutic drug level monitoring: Secondary | ICD-10-CM | POA: Diagnosis not present

## 2022-12-01 DIAGNOSIS — I4891 Unspecified atrial fibrillation: Secondary | ICD-10-CM

## 2022-12-01 LAB — POCT INR: INR: 3.5 — AB (ref 2.0–3.0)

## 2022-12-01 NOTE — Patient Instructions (Signed)
Description   *DOES NOT NEED A PRINTOUT* Only take 1/2 tablet today and then START taking 1 tablet daily.  Stay consistent with greens each week (3 per week)  Repeat INR in 3 weeks Call us with any medication changes or concerns # [phone] [facility]in Clinic

## 2022-12-22 ENCOUNTER — Ambulatory Visit: Payer: Medicare HMO | Attending: Cardiology | Admitting: *Deleted

## 2022-12-22 DIAGNOSIS — M5451 Vertebrogenic low back pain: Secondary | ICD-10-CM | POA: Diagnosis not present

## 2022-12-22 DIAGNOSIS — I4891 Unspecified atrial fibrillation: Secondary | ICD-10-CM | POA: Diagnosis not present

## 2022-12-22 DIAGNOSIS — D6869 Other thrombophilia: Secondary | ICD-10-CM | POA: Insufficient documentation

## 2022-12-22 DIAGNOSIS — Z5181 Encounter for therapeutic drug level monitoring: Secondary | ICD-10-CM | POA: Diagnosis not present

## 2022-12-22 DIAGNOSIS — M5416 Radiculopathy, lumbar region: Secondary | ICD-10-CM | POA: Insufficient documentation

## 2022-12-22 LAB — POCT INR: INR: 2.5 (ref 2.0–3.0)

## 2022-12-22 NOTE — Patient Instructions (Signed)
Description   *DOES NOT NEED A PRINTOUT* Continue taking warfarin 1 tablet daily.  Stay consistent with greens each week (3 per week)  Repeat INR in 4 weeks. Call us with any medication changes or concerns # [phone] [facility]in Clinic

## 2023-01-12 ENCOUNTER — Other Ambulatory Visit: Payer: Self-pay | Admitting: Family Medicine

## 2023-01-12 ENCOUNTER — Telehealth: Payer: Self-pay | Admitting: Family Medicine

## 2023-01-12 NOTE — Telephone Encounter (Signed)
Caller name: HARLO HAMMERS  On DPR?: Yes  Call back number: [phone]  Provider they see: [name], MD  Reason for call: Pt is asking is he due for A1c check up?

## 2023-01-12 NOTE — Telephone Encounter (Signed)
Pt has not been seen since 06/2022 so he is due for  a 6 month follow up . I have scheduled him an apt

## 2023-01-13 DIAGNOSIS — M5416 Radiculopathy, lumbar region: Secondary | ICD-10-CM | POA: Diagnosis not present

## 2023-01-17 ENCOUNTER — Encounter: Payer: Self-pay | Admitting: Family Medicine

## 2023-01-17 ENCOUNTER — Ambulatory Visit (INDEPENDENT_AMBULATORY_CARE_PROVIDER_SITE_OTHER): Payer: Medicare HMO | Admitting: Family Medicine

## 2023-01-17 VITALS — BP 116/68 | HR 57 | Temp 98.1°F | Resp 18 | Ht 70.0 in | Wt 212.0 lb

## 2023-01-17 DIAGNOSIS — E669 Obesity, unspecified: Secondary | ICD-10-CM | POA: Insufficient documentation

## 2023-01-17 DIAGNOSIS — E039 Hypothyroidism, unspecified: Secondary | ICD-10-CM | POA: Diagnosis not present

## 2023-01-17 DIAGNOSIS — I10 Essential (primary) hypertension: Secondary | ICD-10-CM

## 2023-01-17 DIAGNOSIS — E785 Hyperlipidemia, unspecified: Secondary | ICD-10-CM

## 2023-01-17 DIAGNOSIS — E1139 Type 2 diabetes mellitus with other diabetic ophthalmic complication: Secondary | ICD-10-CM

## 2023-01-17 DIAGNOSIS — E1169 Type 2 diabetes mellitus with other specified complication: Secondary | ICD-10-CM

## 2023-01-17 LAB — CBC WITH DIFFERENTIAL/PLATELET
Basophils Absolute: 0.1 10*3/uL (ref 0.0–0.1)
Basophils Relative: 1 % (ref 0.0–3.0)
Eosinophils Absolute: 0.3 10*3/uL (ref 0.0–0.7)
Eosinophils Relative: 3.6 % (ref 0.0–5.0)
HCT: 40 % (ref 39.0–52.0)
Hemoglobin: 13.5 g/dL (ref 13.0–17.0)
Lymphocytes Relative: 28.2 % (ref 12.0–46.0)
Lymphs Abs: 2.2 10*3/uL (ref 0.7–4.0)
MCHC: 33.7 g/dL (ref 30.0–36.0)
MCV: 92.7 fl (ref 78.0–100.0)
Monocytes Absolute: 0.9 10*3/uL (ref 0.1–1.0)
Monocytes Relative: 11.9 % (ref 3.0–12.0)
Neutro Abs: 4.4 10*3/uL (ref 1.4–7.7)
Neutrophils Relative %: 55.3 % (ref 43.0–77.0)
Platelets: 165 10*3/uL (ref 150.0–400.0)
RBC: 4.32 Mil/uL (ref 4.22–5.81)
RDW: 14.6 % (ref 11.5–15.5)
WBC: 7.9 10*3/uL (ref 4.0–10.5)

## 2023-01-17 LAB — LIPID PANEL
Cholesterol: 164 mg/dL (ref 0–200)
HDL: 32.4 mg/dL — ABNORMAL LOW (ref >39.00)
NonHDL: 131.6
Total CHOL/HDL Ratio: 5
Triglycerides: 238 mg/dL — ABNORMAL HIGH (ref 0.0–149.0)
VLDL: 47.6 mg/dL — ABNORMAL HIGH (ref 0.0–40.0)

## 2023-01-17 LAB — BASIC METABOLIC PANEL
BUN: 26 mg/dL — ABNORMAL HIGH (ref 6–23)
CO2: 30 mEq/L (ref 19–32)
Calcium: 9.8 mg/dL (ref 8.4–10.5)
Chloride: 98 mEq/L (ref 96–112)
Creatinine, Ser: 1.11 mg/dL (ref 0.40–1.50)
GFR: 59.87 mL/min — ABNORMAL LOW (ref >60.00)
Glucose, Bld: 117 mg/dL — ABNORMAL HIGH (ref 70–99)
Potassium: 5.2 mEq/L — ABNORMAL HIGH (ref 3.5–5.1)
Sodium: 138 mEq/L (ref 135–145)

## 2023-01-17 LAB — HEPATIC FUNCTION PANEL
ALT: 32 U/L (ref 0–53)
AST: 33 U/L (ref 0–37)
Albumin: 4.3 g/dL (ref 3.5–5.2)
Alkaline Phosphatase: 59 U/L (ref 39–117)
Bilirubin, Direct: 0.2 mg/dL (ref 0.0–0.3)
Total Bilirubin: 0.9 mg/dL (ref 0.2–1.2)
Total Protein: 7.5 g/dL (ref 6.0–8.3)

## 2023-01-17 LAB — TSH: TSH: 3.41 u[IU]/mL (ref 0.35–5.50)

## 2023-01-17 LAB — LDL CHOLESTEROL, DIRECT: Direct LDL: 104 mg/dL

## 2023-01-17 LAB — HEMOGLOBIN A1C: Hgb A1c MFr Bld: 6.7 % — ABNORMAL HIGH (ref 4.6–6.5)

## 2023-01-17 NOTE — Progress Notes (Signed)
   Subjective:    Patient ID: Marvin Foster, male    DOB: [birth year], 89 y.o.   MRN: [id number]  HPI HTN- chronic problem, on Amlodpine 2.5mg  daily, Lasix 20mg  daily, Metoprolol 100mg  BID.  Pt reports feeling well.  No CP, SOB, HA's,   DM- chronic problem, on Metformin 1000mg  BID.  UTD on foot exam, microalbumin.  UTD on eye exam w/ [facility].  Denies symptomatic lows.  CBG 103 this AM.  Denies numbness/tingling  Hyperlipidemia- chronic problem, on Lipitor 40mg  daily.  No abd pain, N/V  Hypothyroid- chronic problem, on Levothyroxine daily.  Pt reports energy is stable.  Denies changes to skin/hair/nails.  Obesity- pt is down 8 lbs since November.  Pt reports he has cut back on eating.     Review of Systems For ROS see HPI     Objective:   Physical Exam Vitals reviewed.  Constitutional:      General: He is not in acute distress.    Appearance: Normal appearance. He is well-developed. He is not ill-appearing.  HENT:     Head: Normocephalic and atraumatic.  Eyes:     Extraocular Movements: Extraocular movements intact.     Conjunctiva/sclera: Conjunctivae normal.     Pupils: Pupils are equal, round, and reactive to light.  Neck:     Thyroid: No thyromegaly.  Cardiovascular:     Rate and Rhythm: Normal rate. Rhythm irregular.     Pulses: Normal pulses.     Heart sounds: Murmur (II/VI SEM) heard.  Pulmonary:     Effort: Pulmonary effort is normal. No respiratory distress.     Breath sounds: Normal breath sounds.  Abdominal:     General: Bowel sounds are normal. There is no distension.     Palpations: Abdomen is soft.  Musculoskeletal:     Cervical back: Normal range of motion and neck supple.     Right lower leg: No edema.     Left lower leg: No edema.  Lymphadenopathy:     Cervical: No cervical adenopathy.  Skin:    General: Skin is warm and dry.  Neurological:     General: No focal deficit present.     Mental Status: He is alert and oriented to person, place, and  time.     Cranial Nerves: No cranial nerve deficit.  Psychiatric:        Mood and Affect: Mood normal.        Behavior: Behavior normal.           Assessment & Plan:

## 2023-01-17 NOTE — Patient Instructions (Signed)
Schedule your complete physical in 6 months We'll notify you of your lab results and make any changes if needed Continue to work on healthy, low carb diet and regular physical activity as able It is safest and best for you not to drive Call with any questions or concerns [place]! Enjoy your cruise!!!

## 2023-01-18 ENCOUNTER — Telehealth: Payer: Self-pay

## 2023-01-18 DIAGNOSIS — H18513 Endothelial corneal dystrophy, bilateral: Secondary | ICD-10-CM | POA: Diagnosis not present

## 2023-01-18 DIAGNOSIS — H35371 Puckering of macula, right eye: Secondary | ICD-10-CM | POA: Diagnosis not present

## 2023-01-18 NOTE — Assessment & Plan Note (Signed)
Ongoing issue for pt.  Currently on Metformin 1000mg  BID.  UTD on foot exam, microalbumin, and eye exam ([facility]).  He is currently asymptomatic w/ exception of known visual issues.  Based on these visual issues and his advanced age- which causes issues w/ reflexes and proprioception- I recommended that he no longer drive.  Pt is understandably upset by this but family (present today) is supportive.  They indicate that they are able to help him w/ his transportation needs.  Check labs.  Adjust meds prn

## 2023-01-18 NOTE — Assessment & Plan Note (Signed)
Chronic problem.  Excellent control today on Amlodipine 2.5mg  daily, Lasix 20mg  daily, and Metoprolol 100mg  BID.  He is currently asymptomatic.  Check labs due to diuretic use but no anticipated med changes.  Will follow.

## 2023-01-18 NOTE — Telephone Encounter (Signed)
Attempted to reach pt no answer on home phone VM full on both lines .

## 2023-01-18 NOTE — Assessment & Plan Note (Signed)
Chronic problem.  Currently asymptomatic on Levothyroxine 75mcg daily.  Check labs.  Adjust meds prn  

## 2023-01-18 NOTE — Assessment & Plan Note (Signed)
Chronic problem.  On Lipitor 40mg daily w/o difficulty.  Check labs.  Adjust meds prn  ?

## 2023-01-18 NOTE — Telephone Encounter (Signed)
-----   Message from Sheliah Hatch, MD sent at 01/18/2023  7:19 AM EDT ----- Labs look great w/ exception of triglycerides (fatty part of blood) and your LDL (bad cholesterol) which has increased by 50 points.  Please make sure you are taking your Atorvastatin daily and being mindful of a healthy diet.  No med changes at this time.

## 2023-01-19 ENCOUNTER — Ambulatory Visit: Payer: Medicare HMO | Attending: Cardiology

## 2023-01-19 DIAGNOSIS — M5451 Vertebrogenic low back pain: Secondary | ICD-10-CM | POA: Diagnosis not present

## 2023-01-19 DIAGNOSIS — I4891 Unspecified atrial fibrillation: Secondary | ICD-10-CM | POA: Diagnosis not present

## 2023-01-19 DIAGNOSIS — Z5181 Encounter for therapeutic drug level monitoring: Secondary | ICD-10-CM | POA: Diagnosis not present

## 2023-01-19 LAB — POCT INR: INR: 2.9 (ref 2.0–3.0)

## 2023-01-19 NOTE — Telephone Encounter (Signed)
Left another VM for pt to return my call.

## 2023-01-19 NOTE — Telephone Encounter (Signed)
Attempted to reach pt no answer not able to leave a message

## 2023-01-19 NOTE — Patient Instructions (Addendum)
Description   *DOES NOT NEED A PRINTOUT* Continue taking warfarin 1 tablet daily.  Stay consistent with greens each week (3 per week)  Repeat INR in 4 weeks. Call us with any medication changes or concerns # [phone] Cou[facility]in Clinic     

## 2023-01-25 DIAGNOSIS — M5451 Vertebrogenic low back pain: Secondary | ICD-10-CM | POA: Diagnosis not present

## 2023-01-27 ENCOUNTER — Encounter: Payer: Self-pay | Admitting: Family Medicine

## 2023-01-27 DIAGNOSIS — M5451 Vertebrogenic low back pain: Secondary | ICD-10-CM | POA: Diagnosis not present

## 2023-01-28 ENCOUNTER — Other Ambulatory Visit: Payer: Self-pay | Admitting: Cardiology

## 2023-01-31 DIAGNOSIS — M1712 Unilateral primary osteoarthritis, left knee: Secondary | ICD-10-CM | POA: Diagnosis not present

## 2023-01-31 DIAGNOSIS — M5416 Radiculopathy, lumbar region: Secondary | ICD-10-CM | POA: Diagnosis not present

## 2023-01-31 DIAGNOSIS — M5451 Vertebrogenic low back pain: Secondary | ICD-10-CM | POA: Diagnosis not present

## 2023-02-04 DIAGNOSIS — M5451 Vertebrogenic low back pain: Secondary | ICD-10-CM | POA: Diagnosis not present

## 2023-02-07 DIAGNOSIS — M5451 Vertebrogenic low back pain: Secondary | ICD-10-CM | POA: Diagnosis not present

## 2023-02-11 DIAGNOSIS — M5451 Vertebrogenic low back pain: Secondary | ICD-10-CM | POA: Diagnosis not present

## 2023-02-15 DIAGNOSIS — M5451 Vertebrogenic low back pain: Secondary | ICD-10-CM | POA: Diagnosis not present

## 2023-02-16 ENCOUNTER — Ambulatory Visit: Payer: Medicare HMO | Attending: Cardiology | Admitting: *Deleted

## 2023-02-16 DIAGNOSIS — I4891 Unspecified atrial fibrillation: Secondary | ICD-10-CM | POA: Diagnosis not present

## 2023-02-16 DIAGNOSIS — Z5181 Encounter for therapeutic drug level monitoring: Secondary | ICD-10-CM | POA: Diagnosis not present

## 2023-02-16 LAB — POCT INR: INR: 2.2 (ref 2.0–3.0)

## 2023-02-16 NOTE — Patient Instructions (Signed)
Description   *DOES NOT NEED A PRINTOUT* Continue taking warfarin 1 tablet daily.  Stay consistent with greens each week (3 per week)  Repeat INR in 6 weeks. Call us with any medication changes or concerns # [phone] Coumadin Clinic

## 2023-02-18 DIAGNOSIS — M5451 Vertebrogenic low back pain: Secondary | ICD-10-CM | POA: Diagnosis not present

## 2023-02-19 ENCOUNTER — Other Ambulatory Visit: Payer: Self-pay | Admitting: Family Medicine

## 2023-02-23 DIAGNOSIS — M5451 Vertebrogenic low back pain: Secondary | ICD-10-CM | POA: Diagnosis not present

## 2023-03-01 DIAGNOSIS — M5451 Vertebrogenic low back pain: Secondary | ICD-10-CM | POA: Diagnosis not present

## 2023-03-04 DIAGNOSIS — M5451 Vertebrogenic low back pain: Secondary | ICD-10-CM | POA: Diagnosis not present

## 2023-03-08 DIAGNOSIS — M5451 Vertebrogenic low back pain: Secondary | ICD-10-CM | POA: Diagnosis not present

## 2023-03-09 ENCOUNTER — Other Ambulatory Visit: Payer: Self-pay | Admitting: Family Medicine

## 2023-03-11 DIAGNOSIS — M5451 Vertebrogenic low back pain: Secondary | ICD-10-CM | POA: Diagnosis not present

## 2023-03-12 ENCOUNTER — Other Ambulatory Visit: Payer: Self-pay | Admitting: Cardiology

## 2023-03-14 DIAGNOSIS — M5451 Vertebrogenic low back pain: Secondary | ICD-10-CM | POA: Diagnosis not present

## 2023-03-15 ENCOUNTER — Ambulatory Visit: Payer: Medicare HMO | Attending: Cardiology | Admitting: Cardiology

## 2023-03-15 ENCOUNTER — Other Ambulatory Visit: Payer: Self-pay | Admitting: Family Medicine

## 2023-03-15 ENCOUNTER — Encounter: Payer: Self-pay | Admitting: Cardiology

## 2023-03-15 VITALS — BP 106/68 | HR 74 | Ht 70.0 in | Wt 214.0 lb

## 2023-03-15 DIAGNOSIS — I350 Nonrheumatic aortic (valve) stenosis: Secondary | ICD-10-CM | POA: Diagnosis not present

## 2023-03-15 DIAGNOSIS — E785 Hyperlipidemia, unspecified: Secondary | ICD-10-CM | POA: Diagnosis not present

## 2023-03-15 NOTE — Progress Notes (Signed)
Cardiology Office Note:    Date:  03/15/2023   ID:  Marvin Foster, DOB 09-08-1934, MRN [id number]  PCP:  Sheliah Hatch, MD   [facility] Providers Cardiologist:  Donato Schultz, MD     Referring MD: Sheliah Hatch, MD    History of Present Illness:    Marvin Foster is a 89 y.o. male here for follow-up of severe aortic stenosis mean gradient 37 mmHg with DI of 0.26 valve area 0.9 cm.  Been feeling fairly well.  Lives in [place] alone.  Retired from [employer].  Has known CAD diabetes hyperlipidemia right bundle branch block.  Past Medical History:  Diagnosis Date   Acute respiratory failure due to COVID-19 [facility]) 07/07/2020   ANEMIA 02/18/2010   Qualifier: Diagnosis of  By: Beverely Low MD, Natalia Leatherwood     Arthritis 12/2005   left hand   Arthritis    Left knee   ASPARTATE AMINOTRANSFERASE, SERUM, ELEVATED 11/04/2008   Qualifier: Diagnosis of  By: Beverely Low MD, Katherine     Atrial fibrillation (HCC) 07/24/2013   Blood transfusion without reported diagnosis    Bursitis of knee    right knee   CAD (coronary artery disease)    Cardiac arrhythmia due to congenital heart disease    Chronic anticoagulation 06/12/2015   Coagulation defect (HCC) 05/28/2020   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   CORONARY ARTERY DISEASE 03/27/2007   Qualifier: Diagnosis of  By: Blossom Hoops MD, Luis     Diabetes mellitus    Diverticulosis    Glaucoma    left eye   Hx of colonic polyps 06/12/2015   Hyperlipidemia    Hypertension    Hypothyroid 09/21/2018   Long term current use of anticoagulant therapy 07/24/2013   Neuropathy    with pain   Pain due to onychomycosis of toenail of left foot 05/28/2020   Permanent atrial fibrillation (HCC)    RBBB    Scrotal mass 03/07/2012   Spinal stenosis 01/20/2005   Tubular adenoma of colon     Past Surgical History:  Procedure Laterality Date   BACK SURGERY     BIOPSY  09/16/2022    Procedure: BIOPSY;  Surgeon: Meryl Dare, MD;  Location: [facility];  Service: Gastroenterology;;   CATARACT EXTRACTION  08/11/2009   right   COLONOSCOPY WITH PROPOFOL N/A 09/16/2022   Procedure: COLONOSCOPY WITH PROPOFOL;  Surgeon: Meryl Dare, MD;  Location: [facility];  Service: Gastroenterology;  Laterality: N/A;   corneal endothelial Left 2013   CORNEAL TRANSPLANT Left    eye   CORONARY ANGIOPLASTY WITH STENT PLACEMENT     ESOPHAGOGASTRODUODENOSCOPY (EGD) WITH PROPOFOL N/A 09/16/2022   Procedure: ESOPHAGOGASTRODUODENOSCOPY (EGD) WITH PROPOFOL;  Surgeon: Meryl Dare, MD;  Location: [facility];  Service: Gastroenterology;  Laterality: N/A;   EYE SURGERY     HOT HEMOSTASIS N/A 09/16/2022   Procedure: HOT HEMOSTASIS (ARGON PLASMA COAGULATION/BICAP);  Surgeon: Meryl Dare, MD;  Location: [facility];  Service: Gastroenterology;  Laterality: N/A;   POLYPECTOMY  09/16/2022   Procedure: POLYPECTOMY;  Surgeon: Meryl Dare, MD;  Location: [facility];  Service: Gastroenterology;;   ROTATOR CUFF REPAIR     SMALL INTESTINE SURGERY     SPINE SURGERY      Current Medications: Current Meds  Medication Sig   amLODipine (NORVASC) 2.5 MG tablet Take 1 tablet by mouth once daily   atorvastatin (LIPITOR) 40 MG tablet TAKE 1 TABLET BY  MOUTH AT BEDTIME   Calcium Carbonate-Vitamin D (CALCIUM 600 + D PO) Take 1 tablet by mouth 2 (two) times daily.   cetirizine (ZYRTEC) 10 MG tablet Take 1 tablet (10 mg total) by mouth daily.   ferrous sulfate 325 (65 FE) MG tablet Take 325 mg by mouth daily.   fluorometholone (FML) 0.1 % ophthalmic suspension Place 1 drop into both eyes every 30 (thirty) days.    furosemide (LASIX) 20 MG tablet Take 1 tablet (20 mg total) by mouth daily. May take an extra 20 mg as needed for lower extremity swelling   glucose blood (ONETOUCH VERIO) test strip USE 1 STRIP TO CHECK GLUCOSE TWICE DAILY AS DIRECTED   Lancets (ONETOUCH ULTRASOFT) lancets Use  as instructed   levothyroxine (SYNTHROID) 75 MCG tablet Take 1 tablet by mouth once daily   magnesium oxide (MAGOX 400) 400 (241.3 Mg) MG tablet Take 1 tablet (400 mg total) by mouth 2 (two) times daily.   metFORMIN (GLUCOPHAGE) 1000 MG tablet Take 1 tablet by mouth twice daily   metoprolol tartrate (LOPRESSOR) 100 MG tablet Take 1 tablet by mouth twice daily   Multiple Vitamin (MULTIVITAMIN) capsule Take 1 capsule by mouth daily.   Omega-3 Fatty Acids (FISH OIL) 1200 MG CAPS Take 3 capsules by mouth daily.   tamsulosin (FLOMAX) 0.4 MG CAPS capsule Take 1 capsule (0.4 mg total) by mouth daily.   timolol (TIMOPTIC) 0.5 % ophthalmic solution Place 1 drop into both eyes daily.    triamcinolone ointment (KENALOG) 0.1 % Apply 1 Application topically 2 (two) times daily. (Patient taking differently: Apply 1 Application topically 2 (two) times daily as needed (itching).)   warfarin (COUMADIN) 2.5 MG tablet TAKE 1 TO 1 & 1/2 (ONE & ONE-HALF) TABLETS BY MOUTH ONCE DAILY     Allergies:   Neomycin-bacitracin zn-polymyx, Penicillins, Enalapril, Miconazole nitrate, and Sulfa antibiotics   Social History   Socioeconomic History   Marital status: Divorced    Spouse name: Not on file   Number of children: 5   Years of education: Not on file   Highest education level: 12th grade  Occupational History   Occupation: retired   Occupation: retired   Occupation: Retired from [employer]  Tobacco Use   Smoking status: Never   Smokeless tobacco: Never  Building services engineer Use: Never used  Substance and Sexual Activity   Alcohol use: Not Currently    Comment: Very seldom---socially   Drug use: No   Sexual activity: Not Currently    Partners: Female  Other Topics Concern   Not on file  Social History Narrative   Volunteers at [employer]         Social Determinants of Health   Financial Resource Strain: Low Risk  (01/13/2023)   Overall Financial Resource Strain (CARDIA)    Difficulty of Paying  Living Expenses: Not hard at all  Food Insecurity: No Food Insecurity (01/13/2023)   Hunger Vital Sign    Worried About Running Out of Food in the Last Year: Never true    Ran Out of Food in the Last Year: Never true  Transportation Needs: No Transportation Needs (01/13/2023)   PRAPARE - Administrator, Civil Service (Medical): No    Lack of Transportation (Non-Medical): No  Physical Activity: Inactive (08/26/2022)   Exercise Vital Sign    Days of Exercise per Week: 0 days    Minutes of Exercise per Session: 0 min  Stress: No Stress Concern Present (08/26/2022)  [employer] of Occupational Health - Occupational Stress Questionnaire    Feeling of Stress : Not at all  Social Connections: Moderately Isolated (08/26/2022)   Social Connection and Isolation Panel [NHANES]    Frequency of Communication with Friends and Family: More than three times a week    Frequency of Social Gatherings with Friends and Family: Three times a week    Attends Religious Services: Never    Active Member of Clubs or Organizations: Yes    Attends Engineer, structural: More than 4 times per year    Marital Status: Divorced     Family History: The patient's family history includes Heart failure in his father and mother. There is no history of Stomach cancer, Colon cancer, Pancreatic cancer, or Throat cancer.  ROS:   Please see the history of present illness.     All other systems reviewed and are negative.  EKGs/Labs/Other Studies Reviewed:    The following studies were reviewed today: Cardiac Studies & Procedures       ECHOCARDIOGRAM  ECHOCARDIOGRAM COMPLETE 11/10/2022  Narrative ECHOCARDIOGRAM REPORT    Patient Name:   JABRIAN DEMATTIA Date of Exam: 11/09/2022 Medical Rec #:  [id number]       Height:       70.0 in Accession #:    [id number]      Weight:       234.0 lb Date of Birth:  1934/08/20       BSA:          2.231 m Patient Age:    88 years        BP:           130/68  mmHg Patient Gender: M               HR:           87 bpm. Exam Location:  [address]  Procedure: 2D Echo, Cardiac Doppler and Color Doppler  Indications:    I35.0 Aortic Stenosis  History:        Patient has prior history of Echocardiogram examinations, most recent 11/26/2021. CAD, Arrythmias:Atrial Fibrillation and RBBB; Risk Factors:Hypertension, Diabetes and Dyslipidemia. Hypothyroidism. History of COVID-19.  Sonographer:    Sedonia Small Rodgers-Jones RDCS Referring Phys: [id number] Khristina Janota C Marlisha Vanwyk  IMPRESSIONS   1. Left ventricular ejection fraction, by estimation, is 60 to 65%. The left ventricle has normal function. The left ventricle has no regional wall motion abnormalities. Left ventricular diastolic parameters are indeterminate. 2. Right ventricular systolic function is normal. The right ventricular size is normal. Tricuspid regurgitation signal is inadequate for assessing PA pressure. 3. The mitral valve is degenerative. Trivial mitral valve regurgitation. No evidence of mitral stenosis. Moderate mitral annular calcification with bulky calcification in submitral apparatus. 4. The aortic valve is abnormal. There is severe calcifcation of the aortic valve. Aortic valve regurgitation is not visualized. Moderate to severe aortic valve stenosis. Aortic valve area, by VTI measures 0.90 cm. Aortic valve mean gradient measures 36.5 mmHg. Aortic valve Vmax measures 3.53 m/s. DI 0.26. SVI 32 ml/m2. 5. Aortic dilatation noted. There is borderline dilatation of the ascending aorta, measuring 40 mm. 6. The inferior vena cava is dilated in size with >50% respiratory variability, suggesting right atrial pressure of 8 mmHg.  FINDINGS Left Ventricle: Left ventricular ejection fraction, by estimation, is 60 to 65%. The left ventricle has normal function. The left ventricle has no regional wall motion abnormalities. The left ventricular internal cavity size was normal in size.  There is borderline left  ventricular hypertrophy. Left ventricular diastolic parameters are indeterminate.  Right Ventricle: The right ventricular size is normal. No increase in right ventricular wall thickness. Right ventricular systolic function is normal. Tricuspid regurgitation signal is inadequate for assessing PA pressure.  Left Atrium: Left atrial size was normal in size.  Right Atrium: Right atrial size was normal in size.  Pericardium: There is no evidence of pericardial effusion.  Mitral Valve: The mitral valve is degenerative in appearance. Moderate mitral annular calcification. Trivial mitral valve regurgitation. No evidence of mitral valve stenosis.  Tricuspid Valve: The tricuspid valve is normal in structure. Tricuspid valve regurgitation is trivial. No evidence of tricuspid stenosis.  Aortic Valve: The aortic valve is abnormal. There is severe calcifcation of the aortic valve. Aortic valve regurgitation is not visualized. Moderate to severe aortic stenosis is present. Aortic valve mean gradient measures 36.5 mmHg. Aortic valve peak gradient measures 49.8 mmHg. Aortic valve area, by VTI measures 0.90 cm.  Pulmonic Valve: The pulmonic valve was normal in structure. Pulmonic valve regurgitation is not visualized. No evidence of pulmonic stenosis.  Aorta: Aortic dilatation noted. There is borderline dilatation of the ascending aorta, measuring 40 mm.  Venous: The inferior vena cava is dilated in size with greater than 50% respiratory variability, suggesting right atrial pressure of 8 mmHg.  IAS/Shunts: No atrial level shunt detected by color flow Doppler.   LEFT VENTRICLE PLAX 2D LVIDd:         4.90 cm LVIDs:         3.30 cm LV PW:         1.00 cm LV IVS:        1.00 cm LVOT diam:     2.10 cm LV SV:         72 LV SV Index:   32 LVOT Area:     3.46 cm   RIGHT VENTRICLE             IVC RV Basal diam:  4.40 cm     IVC diam: 1.70 cm RV S prime:     12.07 cm/s TAPSE (M-mode): 2.2 cm  LEFT  ATRIUM             Index        RIGHT ATRIUM           Index LA diam:        5.60 cm 2.51 cm/m   RA Area:     16.20 cm LA Vol (A2C):   46.1 ml 20.66 ml/m  RA Volume:   45.70 ml  20.48 ml/m LA Vol (A4C):   37.2 ml 16.67 ml/m LA Biplane Vol: 43.2 ml 19.36 ml/m AORTIC VALVE AV Area (Vmax):    1.02 cm AV Area (Vmean):   0.88 cm AV Area (VTI):     0.90 cm AV Vmax:           353.00 cm/s AV Vmean:          269.200 cm/s AV VTI:            0.799 m AV Peak Grad:      49.8 mmHg AV Mean Grad:      36.5 mmHg LVOT Vmax:         104.00 cm/s LVOT Vmean:        68.700 cm/s LVOT VTI:          0.207 m LVOT/AV VTI ratio: 0.26  AORTA Ao Root diam: 3.70 cm Ao Asc diam:  4.00 cm  MR Peak grad: 108.4 mmHg MR Mean grad: 86.0 mmHg     SHUNTS MR Vmax:      520.50 cm/s   Systemic VTI:  0.21 m MR Vmean:     452.5 cm/s    Systemic Diam: 2.10 cm MV E velocity: 113.67 cm/s  Weston Brass MD Electronically signed by Weston Brass MD Signature Date/Time: 11/10/2022/9:54:20 AM    Final              EKG:  No new, Prior AFIB RBBB  Recent Labs: 01/17/2023: ALT 32; BUN 26; Creatinine, Ser 1.11; Hemoglobin 13.5; Platelets 165.0; Potassium 5.2; Sodium 138; TSH 3.41  Recent Lipid Panel    Component Value Date/Time   CHOL 164 01/17/2023 0909   TRIG 238.0 (H) 01/17/2023 0909   HDL 32.40 (L) 01/17/2023 0909   CHOLHDL 5 01/17/2023 0909   VLDL 47.6 (H) 01/17/2023 0909   LDLCALC 47 05/28/2022 1402   LDLDIRECT 104.0 01/17/2023 0909     Risk Assessment/Calculations:               Physical Exam:    VS:  BP 106/68   Pulse 74   Ht 5\' 10"  (1.778 m)   Wt 214 lb (97.1 kg)   SpO2 97%   BMI 30.71 kg/m     Wt Readings from Last 3 Encounters:  03/15/23 214 lb (97.1 kg)  01/17/23 212 lb (96.2 kg)  11/22/22 216 lb 9.6 oz (98.2 kg)     GEN:  Well nourished, well developed in no acute distress HEENT: Normal NECK: No JVD; No carotid bruits LYMPHATICS: No lymphadenopathy CARDIAC: IRRR,  3/6 murmurs, rubs, gallops RESPIRATORY:  Clear to auscultation without rales, wheezing or rhonchi  ABDOMEN: Soft, non-tender, non-distended MUSCULOSKELETAL:  No edema; No deformity  SKIN: Warm and dry NEUROLOGIC:  Alert and oriented x 3 PSYCHIATRIC:  Normal affect   ASSESSMENT:    1. Severe aortic valve stenosis   2. Hyperlipidemia LDL goal <70    PLAN:    In order of problems listed above:  Severe aortic stenosis - Severe stage C aortic valve stenosis.  Thickened calcified limited leaflet mobility, benefit from AVR  Not a good candidate for surgical approach.  May be a good candidate for TAVR  Repeating echo in September 2024.  Coronary artery disease - Prior proximal LAD stent placed in 03/01/2005.  Atrial fibrillation - On Coumadin.  Rate controlled with right bundle branch block stable.  EF 60 to 65%.            Medication Adjustments/Labs and Tests Ordered: Current medicines are reviewed at length with the patient today.  Concerns regarding medicines are outlined above.  No orders of the defined types were placed in this encounter.  No orders of the defined types were placed in this encounter.   Patient Instructions  Medication Instructions:  The current medical regimen is effective;  continue present plan and medications.  *If you need a refill on your cardiac medications before your next appointment, please call your pharmacy*   Follow-Up: At [facility], you and your health needs are our priority.  As part of our continuing mission to provide you with exceptional heart care, we have created designated Provider Care Teams.  These Care Teams include your primary Cardiologist (physician) and Advanced Practice Providers (APPs -  Physician Assistants and Nurse Practitioners) who all work together to provide you with the care you need, when you need it.  We recommend signing  up for the patient portal called "MyChart".  Sign up information is provided  on this After Visit Summary.  MyChart is used to connect with patients for Virtual Visits (Telemedicine).  Patients are able to view lab/test results, encounter notes, upcoming appointments, etc.  Non-urgent messages can be sent to your provider as well.   To learn more about what you can do with MyChart, go to [email or web address].    Your next appointment:   9 month(s)  Provider:   Donato Schultz, MD        Signed, Donato Schultz, MD  03/15/2023 2:57 PM    [facility]

## 2023-03-15 NOTE — Patient Instructions (Signed)
Medication Instructions:  The current medical regimen is effective;  continue present plan and medications.  *If you need a refill on your cardiac medications before your next appointment, please call your pharmacy*   Follow-Up: At [facility], you and your health needs are our priority.  As part of our continuing mission to provide you with exceptional heart care, we have created designated Provider Care Teams.  These Care Teams include your primary Cardiologist (physician) and Advanced Practice Providers (APPs -  Physician Assistants and Nurse Practitioners) who all work together to provide you with the care you need, when you need it.  We recommend signing up for the patient portal called "MyChart".  Sign up information is provided on this After Visit Summary.  MyChart is used to connect with patients for Virtual Visits (Telemedicine).  Patients are able to view lab/test results, encounter notes, upcoming appointments, etc.  Non-urgent messages can be sent to your provider as well.   To learn more about what you can do with MyChart, go to [email or web address].    Your next appointment:   9 month(s)  Provider:   Donato Schultz, MD

## 2023-03-16 DIAGNOSIS — H353221 Exudative age-related macular degeneration, left eye, with active choroidal neovascularization: Secondary | ICD-10-CM | POA: Diagnosis not present

## 2023-03-16 DIAGNOSIS — H401112 Primary open-angle glaucoma, right eye, moderate stage: Secondary | ICD-10-CM | POA: Diagnosis not present

## 2023-03-16 DIAGNOSIS — H35371 Puckering of macula, right eye: Secondary | ICD-10-CM | POA: Diagnosis not present

## 2023-03-16 DIAGNOSIS — H18513 Endothelial corneal dystrophy, bilateral: Secondary | ICD-10-CM | POA: Diagnosis not present

## 2023-03-16 DIAGNOSIS — M5451 Vertebrogenic low back pain: Secondary | ICD-10-CM | POA: Diagnosis not present

## 2023-03-16 DIAGNOSIS — H401123 Primary open-angle glaucoma, left eye, severe stage: Secondary | ICD-10-CM | POA: Diagnosis not present

## 2023-03-22 DIAGNOSIS — M5451 Vertebrogenic low back pain: Secondary | ICD-10-CM | POA: Diagnosis not present

## 2023-03-24 ENCOUNTER — Encounter (INDEPENDENT_AMBULATORY_CARE_PROVIDER_SITE_OTHER): Payer: Medicare HMO | Admitting: Ophthalmology

## 2023-03-24 DIAGNOSIS — H353221 Exudative age-related macular degeneration, left eye, with active choroidal neovascularization: Secondary | ICD-10-CM

## 2023-03-24 DIAGNOSIS — H353112 Nonexudative age-related macular degeneration, right eye, intermediate dry stage: Secondary | ICD-10-CM | POA: Diagnosis not present

## 2023-03-24 DIAGNOSIS — H43813 Vitreous degeneration, bilateral: Secondary | ICD-10-CM | POA: Diagnosis not present

## 2023-03-24 DIAGNOSIS — I10 Essential (primary) hypertension: Secondary | ICD-10-CM

## 2023-03-24 DIAGNOSIS — H35033 Hypertensive retinopathy, bilateral: Secondary | ICD-10-CM | POA: Diagnosis not present

## 2023-03-25 DIAGNOSIS — M5451 Vertebrogenic low back pain: Secondary | ICD-10-CM | POA: Diagnosis not present

## 2023-03-29 ENCOUNTER — Ambulatory Visit: Payer: Medicare HMO | Attending: Cardiovascular Disease

## 2023-03-29 DIAGNOSIS — I4891 Unspecified atrial fibrillation: Secondary | ICD-10-CM | POA: Diagnosis not present

## 2023-03-29 DIAGNOSIS — Z5181 Encounter for therapeutic drug level monitoring: Secondary | ICD-10-CM | POA: Diagnosis not present

## 2023-03-29 LAB — POCT INR: INR: 3.6 — AB (ref 2.0–3.0)

## 2023-03-29 NOTE — Patient Instructions (Signed)
*  DOES NOT NEED A PRINTOUT* HOLD TONIGHT ONLY THEN CONTINUE  taking warfarin 1 tablet daily.  Stay consistent with greens each week (3 per week)  Repeat INR in 4 weeks. Call us with any medication changes or concerns # [phone] [facility]

## 2023-04-13 ENCOUNTER — Telehealth (INDEPENDENT_AMBULATORY_CARE_PROVIDER_SITE_OTHER): Payer: Medicare HMO | Admitting: Family Medicine

## 2023-04-13 ENCOUNTER — Encounter: Payer: Self-pay | Admitting: Family Medicine

## 2023-04-13 ENCOUNTER — Telehealth: Payer: Self-pay | Admitting: *Deleted

## 2023-04-13 VITALS — Temp 100.0°F

## 2023-04-13 DIAGNOSIS — M109 Gout, unspecified: Secondary | ICD-10-CM | POA: Diagnosis not present

## 2023-04-13 DIAGNOSIS — U071 COVID-19: Secondary | ICD-10-CM | POA: Diagnosis not present

## 2023-04-13 MED ORDER — NIRMATRELVIR/RITONAVIR (PAXLOVID)TABLET
3.0000 | ORAL_TABLET | Freq: Two times a day (BID) | ORAL | 0 refills | Status: AC
Start: 2023-04-13 — End: 2023-04-18

## 2023-04-13 MED ORDER — PREDNISONE 10 MG PO TABS
ORAL_TABLET | ORAL | 0 refills | Status: AC
Start: 2023-04-13 — End: 2023-04-18

## 2023-04-13 NOTE — Telephone Encounter (Signed)
Pt's daughter called and stated that pt was started on a 6 day course of prednisone. Pt starting today 7/3.    7/3- 60mg  prednsione 7/4-50mg  prednisone 7/5-40mg  prednisone 7/6-30mg  prednisone 7/7-20mg  prednisone 7/8-10mg  prednsione  Pt uses warfarin 2.5mg  tablets and take 1 tablet daily. Instructed for pt to take warfarin 1 tablet daily expect for 1/2 a tablet on Thursday (7/4) and Saturday (7/6)  while on the prednisone. Pt is to come in on 7/9 to have INR checked.

## 2023-04-13 NOTE — Progress Notes (Addendum)
Virtual Visit via Video Note  I connected with Marvin Foster and patients daughter in law on 04/13/23 at 1:59pm by a video enabled telemedicine application and verified that I am speaking with the correct person using two identifiers.   Patient Location: Home Provider Location: office - [facility]    I discussed the limitations, risks, security and privacy concerns of performing an evaluation and management service by telephone and the availability of in person appointments. I also discussed with the patient that there may be a patient responsible charge related to this service. The patient expressed understanding and agreed to proceed, consent obtained  Chief Complaint  Patient presents with   Covid Positive    Pt notes tested positive earlier today, came home from a cruise and half the family has tested positive 100 temp, difficulty breathing, cough, fatigue, sinus drainage   Foot Swelling    Pt notes big toe and top of foot Rt side tender and swollen, has an appointment 04/22/23 with podiatry, notes it is worse this week than it has been.     History of Present Illness: Marvin Foster is a 89 y.o. male that tested positive for covid at home.  +Low grade fever +Non productive cough +Nasal congestion +Fatigue   -headache -body aches -sore throat -chest pain  -shortness of breath -difficulty breathing  -ear ache or drainage  -abd pain, nausea, or vomiting   Right big toe and foot are more swollen than usual and big toe is red. Patients daughter in law reports during vacation on a cruise, he had significant edema in lower extremity. Denies any open wounds or drainage. Patient reports it is not real painful to ambulate on foot, but severely painful when a sheet or something touches it.   Patient Active Problem List   Diagnosis Date Noted   Obesity (BMI 30-39.9) 01/17/2023   Heme positive stool 09/16/2022   Gastritis determined by endoscopy 09/16/2022   AVM  (arteriovenous malformation) of small bowel, acquired 09/16/2022   Benign neoplasm of ascending colon 09/16/2022   Benign neoplasm of transverse colon 09/16/2022   Benign neoplasm of descending colon 09/16/2022   Venous stasis of both lower extremities 05/28/2022   Aortic stenosis 11/11/2021   Glaucoma 10/16/2020   Coagulation defect (HCC) 05/28/2020   Pain due to onychomycosis of toenail of left foot 05/28/2020   Hypothyroid 09/21/2018   Hyperlipidemia associated with type 2 diabetes mellitus (HCC) 08/05/2015   Coronary artery disease due to lipid rich plaque 08/05/2015   Hx of colonic polyps 06/12/2015   Chronic anticoagulation 06/12/2015   Atrophy, Fuchs' 05/07/2015   Dermatitis of ear canal 05/07/2015   Encounter for therapeutic drug monitoring 11/06/2013   Atrial fibrillation (HCC) 07/24/2013   Long term current use of anticoagulant therapy 07/24/2013   Bilateral leg weakness 06/07/2013   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   Scrotal mass 03/07/2012   General medical examination 02/28/2012   ANEMIA 02/18/2010   HEARING LOSS 12/22/2009   ASPARTATE AMINOTRANSFERASE, SERUM, ELEVATED 11/04/2008   LEG CRAMPS, NOCTURNAL 08/16/2008   Diabetes mellitus type II, controlled (HCC) 05/12/2007   Essential hypertension 03/27/2007   CORONARY ARTERY DISEASE 03/27/2007   Past Medical History:  Diagnosis Date   Acute respiratory failure due to COVID-19 [facility]) 07/07/2020   ANEMIA 02/18/2010   Qualifier: Diagnosis of  By: Beverely Low MD, Natalia Leatherwood     Arthritis 12/2005   left hand   Arthritis    Left knee  ASPARTATE AMINOTRANSFERASE, SERUM, ELEVATED 11/04/2008   Qualifier: Diagnosis of  By: Beverely Low MD, Katherine     Atrial fibrillation (HCC) 07/24/2013   Blood transfusion without reported diagnosis    Bursitis of knee    right knee   CAD (coronary artery disease)    Cardiac arrhythmia due to congenital heart disease    Chronic anticoagulation 06/12/2015    Coagulation defect (HCC) 05/28/2020   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   CORONARY ARTERY DISEASE 03/27/2007   Qualifier: Diagnosis of  By: Blossom Hoops MD, Luis     Diabetes mellitus    Diverticulosis    Glaucoma    left eye   Hx of colonic polyps 06/12/2015   Hyperlipidemia    Hypertension    Hypothyroid 09/21/2018   Long term current use of anticoagulant therapy 07/24/2013   Neuropathy    with pain   Pain due to onychomycosis of toenail of left foot 05/28/2020   Permanent atrial fibrillation (HCC)    RBBB    Scrotal mass 03/07/2012   Spinal stenosis 01/20/2005   Tubular adenoma of colon    Past Surgical History:  Procedure Laterality Date   BACK SURGERY     BIOPSY  09/16/2022   Procedure: BIOPSY;  Surgeon: Meryl Dare, MD;  Location: [facility];  Service: Gastroenterology;;   CATARACT EXTRACTION  08/11/2009   right   COLONOSCOPY WITH PROPOFOL N/A 09/16/2022   Procedure: COLONOSCOPY WITH PROPOFOL;  Surgeon: Meryl Dare, MD;  Location: [facility];  Service: Gastroenterology;  Laterality: N/A;   corneal endothelial Left 2013   CORNEAL TRANSPLANT Left    eye   CORONARY ANGIOPLASTY WITH STENT PLACEMENT     ESOPHAGOGASTRODUODENOSCOPY (EGD) WITH PROPOFOL N/A 09/16/2022   Procedure: ESOPHAGOGASTRODUODENOSCOPY (EGD) WITH PROPOFOL;  Surgeon: Meryl Dare, MD;  Location: [facility];  Service: Gastroenterology;  Laterality: N/A;   EYE SURGERY     HOT HEMOSTASIS N/A 09/16/2022   Procedure: HOT HEMOSTASIS (ARGON PLASMA COAGULATION/BICAP);  Surgeon: Meryl Dare, MD;  Location: [facility];  Service: Gastroenterology;  Laterality: N/A;   POLYPECTOMY  09/16/2022   Procedure: POLYPECTOMY;  Surgeon: Meryl Dare, MD;  Location: [facility];  Service: Gastroenterology;;   ROTATOR CUFF REPAIR     SMALL INTESTINE SURGERY     SPINE SURGERY     Allergies  Allergen Reactions   Neomycin-Bacitracin Zn-Polymyx Rash   Penicillins  Other (See Comments) and Rash    Pt states this was diagnosed years ago, unsure as to reaction. Will not use.    Enalapril     Stopped 11/2020 due to hyperkalemia due to this   Miconazole Nitrate     Unsure of reaction    Sulfa Antibiotics     Pt not aware of allergy    Prior to Admission medications   Medication Sig Start Date End Date Taking? Authorizing Provider  amLODipine (NORVASC) 2.5 MG tablet Take 1 tablet by mouth once daily 01/28/23  Yes Skains, Veverly Fells, MD  atorvastatin (LIPITOR) 40 MG tablet TAKE 1 TABLET BY MOUTH AT BEDTIME 05/24/22  Yes Sheliah Hatch, MD  Calcium Carbonate-Vitamin D (CALCIUM 600 + D PO) Take 1 tablet by mouth 2 (two) times daily.   Yes [provider]  cetirizine (ZYRTEC) 10 MG tablet Take 1 tablet (10 mg total) by mouth daily. 08/25/22  Yes Sheliah Hatch, MD  diclofenac Sodium (VOLTAREN) 1 % GEL Apply 2 g topically 4 (four) times daily. 12/14/19  Yes Sheliah Hatch, MD  ferrous sulfate 325 (65 FE) MG tablet Take 325 mg by mouth daily.   Yes [provider]  fluorometholone (FML) 0.1 % ophthalmic suspension Place 1 drop into both eyes every 30 (thirty) days.  10/20/16  Yes [provider]  furosemide (LASIX) 20 MG tablet Take 1 tablet (20 mg total) by mouth daily. May take an extra 20 mg as needed for lower extremity swelling 11/12/22  Yes Asa Lente, Tessa N, PA-C  glucose blood (ONETOUCH VERIO) test strip USE 1 STRIP TO CHECK GLUCOSE TWICE DAILY AS DIRECTED 03/09/23  Yes Sheliah Hatch, MD  Lancets [facility] ULTRASOFT) lancets Use as instructed 02/02/19  Yes Sheliah Hatch, MD  levothyroxine (SYNTHROID) 75 MCG tablet Take 1 tablet by mouth once daily 03/15/23  Yes Sheliah Hatch, MD  magnesium oxide (MAGOX 400) 400 (241.3 Mg) MG tablet Take 1 tablet (400 mg total) by mouth 2 (two) times daily. 11/14/20  Yes Dunn, Tacey Ruiz, PA-C  metFORMIN (GLUCOPHAGE) 1000 MG tablet Take 1 tablet by mouth twice daily 02/21/23  Yes Sheliah Hatch, MD  metoprolol tartrate (LOPRESSOR) 100 MG tablet Take 1 tablet by mouth twice daily 02/21/23  Yes Sheliah Hatch, MD  Multiple Vitamin (MULTIVITAMIN) capsule Take 1 capsule by mouth daily.   Yes [provider]  Omega-3 Fatty Acids (FISH OIL) 1200 MG CAPS Take 3 capsules by mouth daily.   Yes [provider]  tamsulosin (FLOMAX) 0.4 MG CAPS capsule Take 1 capsule (0.4 mg total) by mouth daily. 11/25/22  Yes Sheliah Hatch, MD  timolol (TIMOPTIC) 0.5 % ophthalmic solution Place 1 drop into both eyes daily.  10/19/16  Yes [provider]  triamcinolone ointment (KENALOG) 0.1 % Apply 1 Application topically 2 (two) times daily. Patient taking differently: Apply 1 Application topically 2 (two) times daily as needed (itching). 05/28/22 05/28/23 Yes Sheliah Hatch, MD  warfarin (COUMADIN) 2.5 MG tablet TAKE 1 TO 1 & 1/2 (ONE & ONE-HALF) TABLETS BY MOUTH ONCE DAILY 03/14/23  Yes Jake Bathe, MD  omeprazole (PRILOSEC) 40 MG capsule Take 1 capsule (40 mg total) by mouth 2 (two) times daily for 14 days. 09/28/22 10/12/22  Meryl Dare, MD   Social History   Socioeconomic History   Marital status: Divorced    Spouse name: Not on file   Number of children: 5   Years of education: Not on file   Highest education level: 12th grade  Occupational History   Occupation: retired   Occupation: retired   Occupation: Retired from [employer]  Tobacco Use   Smoking status: Never   Smokeless tobacco: Never  Building services engineer Use: Never used  Substance and Sexual Activity   Alcohol use: Not Currently    Comment: Very seldom---socially   Drug use: No   Sexual activity: Not Currently    Partners: Female  Other Topics Concern   Not on file  Social History Narrative   Volunteers at [employer]         Social Determinants of Health   Financial Resource Strain: Low Risk  (01/13/2023)   Overall Financial Resource Strain (CARDIA)    Difficulty of  Paying Living Expenses: Not hard at all  Food Insecurity: No Food Insecurity (01/13/2023)   Hunger Vital Sign    Worried About Running Out of Food in the Last Year: Never true    Ran Out of Food in the Last Year: Never true  Transportation Needs: No Transportation Needs (01/13/2023)   PRAPARE - Administrator, Civil Service (Medical): No    Lack of Transportation (Non-Medical): No  Physical Activity: Inactive (08/26/2022)   Exercise Vital Sign    Days of Exercise per Week: 0 days    Minutes of Exercise per Session: 0 min  Stress: No Stress Concern Present (08/26/2022)   [employer] of Occupational Health - Occupational Stress Questionnaire    Feeling of Stress : Not at all  Social Connections: Moderately Isolated (08/26/2022)   Social Connection and Isolation Panel [NHANES]    Frequency of Communication with Friends and Family: More than three times a week    Frequency of Social Gatherings with Friends and Family: Three times a week    Attends Religious Services: Never    Active Member of Clubs or Organizations: Yes    Attends Banker Meetings: More than 4 times per year    Marital Status: Divorced  Intimate Partner Violence: Not At Risk (08/26/2022)   Humiliation, Afraid, Rape, and Kick questionnaire    Fear of Current or Ex-Partner: No    Emotionally Abused: No    Physically Abused: No    Sexually Abused: No    Observations/Objective: Today's Vitals   04/13/23 1348  Temp: 100 F (37.8 C)  TempSrc: Oral   Physical Exam Constitutional:      General: He is not in acute distress.    Appearance: He is not toxic-appearing.  HENT:     Head: Normocephalic and atraumatic.  Pulmonary:     Effort: Pulmonary effort is normal. No respiratory distress.  Musculoskeletal:        General: Swelling present.     Right lower leg: Edema present.     Comments: Redness noted to right big toe based on picture sent through MyChart.   Neurological:     Mental  Status: He is alert.     Assessment and Plan: Acute gout involving toe of right foot, unspecified cause -     predniSONE; Take 6 tablets (60 mg total) by mouth daily with breakfast for 1 day, THEN 5 tablets (50 mg total) daily with breakfast for 1 day, THEN 4 tablets (40 mg total) daily with breakfast for 1 day, THEN 3 tablets (30 mg total) daily with breakfast for 1 day, THEN 2 tablets (20 mg total) daily with breakfast for 1 day, THEN 1 tablet (10 mg total) daily with breakfast for 1 day.  Dispense: 21 tablet; Refill: 0  COVID -     nirmatrelvir/ritonavir; Take 3 tablets by mouth 2 (two) times daily for 5 days. (Take nirmatrelvir 150 mg two tablets twice daily for 5 days and ritonavir 100 mg one tablet twice daily for 5 days) Patient GFR is 59.87  Dispense: 30 tablet; Refill: 0  -Seen a picture of right foot and big toe that has been upload through MyChart during visit. Based on subjective data and image. Suspect this is more related to gout than infection. Prescribed Prednisone 6 day taper dose pack. Unable to prescribed Colchine due to drug interaction with Paxlovid and unable to prescribed Indomethacin due to taking Warfarin.  -Prescribed Paxlovid for covid. Advised to not take Lipitor for 14 days with taking Paxlovid.  -Instructed patients daughter in law to contact the [facility] to inform them of the medication changes and if he needs to be sooner than  07/16 for INR level. -Recommend to rest, hydrate, take deep breaths periodically while awake.  -  Recommend vitamin supplement to support immune system for 30 days. (Vitamin C 1000mg , Vitamin D 1000IU, and Zinc 50-100mg  daily. -Recommend to closely monitor patient and if he starts having SHOB, CP, difficulty breathing, low oxygen saturation, or any further symptoms with his toe/foot, he needs to go directly to the emergency department.  -Patients daughter in law reports she has been unable to obtain a blood glucose stick on his finger.  Instructed on ways to get blood flow to the finger to obtain a sample. Informed if they were not obtain a sample, patient would need to be seen in the emergency department. -After visit, sent Rockney Ghee, CMA a message to let the patient and family to know, to take Tylenol 1000mg  every 8 hours for fever and body aches. Recommend to not take NSAIDS, such as Advil, Ibuprofen, Naproxen, Aleve unless necessary. This can interfere with Warfarin and Paxlovid.  -Follow up if not improved.    Call started at 1:59pm and completed at 2:53pm. Total time on telehealth visit 54 minutes Please bill for time spent with having visit, spending time on medications, patients family trying to upload a picture of his right toe/foot into MyChart, and going over the above plan with patient and family.   I discussed the assessment and treatment plan with the patient. The patient was provided an opportunity to ask questions and all were answered. The patient agreed with the plan and demonstrated an understanding of the instructions.   The patient was advised to call back or seek an in-person evaluation if the symptoms worsen or if the condition fails to improve as anticipated.  Zandra Abts, NP

## 2023-04-13 NOTE — Addendum Note (Signed)
Addended by: Alveria Apley on: 04/13/2023 03:36 PM   Modules accepted: Level of Service

## 2023-04-15 NOTE — Addendum Note (Signed)
Addended by: Alveria Apley on: 04/15/2023 08:47 AM   Modules accepted: Level of Service

## 2023-04-19 ENCOUNTER — Ambulatory Visit: Payer: Medicare HMO | Attending: Cardiology

## 2023-04-19 DIAGNOSIS — I4891 Unspecified atrial fibrillation: Secondary | ICD-10-CM | POA: Diagnosis not present

## 2023-04-19 DIAGNOSIS — Z5181 Encounter for therapeutic drug level monitoring: Secondary | ICD-10-CM

## 2023-04-19 LAB — POCT INR: INR: 2.5 (ref 2.0–3.0)

## 2023-04-19 NOTE — Patient Instructions (Signed)
*  DOES NOT NEED A PRINTOUT*  CONTINUE  taking warfarin 1 tablet daily.  Stay consistent with greens each week (3 per week)  Repeat INR in 4 weeks. Call us with any medication changes or concerns # [phone] [facility]

## 2023-04-20 ENCOUNTER — Other Ambulatory Visit: Payer: Self-pay | Admitting: Family Medicine

## 2023-04-21 ENCOUNTER — Other Ambulatory Visit: Payer: Self-pay | Admitting: Family Medicine

## 2023-04-21 ENCOUNTER — Encounter (INDEPENDENT_AMBULATORY_CARE_PROVIDER_SITE_OTHER): Payer: Medicare HMO | Admitting: Ophthalmology

## 2023-04-21 DIAGNOSIS — H35033 Hypertensive retinopathy, bilateral: Secondary | ICD-10-CM

## 2023-04-21 DIAGNOSIS — I10 Essential (primary) hypertension: Secondary | ICD-10-CM

## 2023-04-21 DIAGNOSIS — E1169 Type 2 diabetes mellitus with other specified complication: Secondary | ICD-10-CM

## 2023-04-21 DIAGNOSIS — H353112 Nonexudative age-related macular degeneration, right eye, intermediate dry stage: Secondary | ICD-10-CM | POA: Diagnosis not present

## 2023-04-21 DIAGNOSIS — H43813 Vitreous degeneration, bilateral: Secondary | ICD-10-CM | POA: Diagnosis not present

## 2023-04-21 DIAGNOSIS — H353221 Exudative age-related macular degeneration, left eye, with active choroidal neovascularization: Secondary | ICD-10-CM

## 2023-04-22 ENCOUNTER — Ambulatory Visit: Payer: Medicare HMO | Admitting: Podiatry

## 2023-04-22 DIAGNOSIS — M7751 Other enthesopathy of right foot: Secondary | ICD-10-CM

## 2023-04-22 DIAGNOSIS — M10071 Idiopathic gout, right ankle and foot: Secondary | ICD-10-CM

## 2023-04-22 NOTE — Progress Notes (Signed)
Subjective:  Patient ID: Marvin Foster, male    DOB: 05-31-1934,  MRN: [id number]  No chief complaint on file.   89 y.o. male presents with the above complaint.  Patient presents with complaint of right first metatarsophalangeal joint.  Patient states pain for touch is progressive gotten worse.  He states that he just came out of nowhere is red hot swollen joint.  He went to the urgent care who placed him on Medrol Dosepak which helped him considerably.  He is here today to discuss treatment options he has not seen and was prior to seeing me he still has some residual pain pain scale is 5 out of 10 dull aching nature   Review of Systems: Negative except as noted in the HPI. Denies N/V/F/Ch.  Past Medical History:  Diagnosis Date   Acute respiratory failure due to COVID-19 (HCC) 07/07/2020   ANEMIA 02/18/2010   Qualifier: Diagnosis of  By: Beverely Low MD, Natalia Leatherwood     Arthritis 12/2005   left hand   Arthritis    Left knee   ASPARTATE AMINOTRANSFERASE, SERUM, ELEVATED 11/04/2008   Qualifier: Diagnosis of  By: Beverely Low MD, Katherine     Atrial fibrillation (HCC) 07/24/2013   Blood transfusion without reported diagnosis    Bursitis of knee    right knee   CAD (coronary artery disease)    Cardiac arrhythmia due to congenital heart disease    Chronic anticoagulation 06/12/2015   Coagulation defect (HCC) 05/28/2020   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   CORONARY ARTERY DISEASE 03/27/2007   Qualifier: Diagnosis of  By: Blossom Hoops MD, Luis     Diabetes mellitus    Diverticulosis    Glaucoma    left eye   Hx of colonic polyps 06/12/2015   Hyperlipidemia    Hypertension    Hypothyroid 09/21/2018   Long term current use of anticoagulant therapy 07/24/2013   Neuropathy    with pain   Pain due to onychomycosis of toenail of left foot 05/28/2020   Permanent atrial fibrillation (HCC)    RBBB    Scrotal mass 03/07/2012   Spinal stenosis 01/20/2005    Tubular adenoma of colon     Current Outpatient Medications:    amLODipine (NORVASC) 2.5 MG tablet, Take 1 tablet by mouth once daily, Disp: 90 tablet, Rfl: 2   atorvastatin (LIPITOR) 40 MG tablet, TAKE 1 TABLET BY MOUTH AT BEDTIME, Disp: 90 tablet, Rfl: 0   Calcium Carbonate-Vitamin D (CALCIUM 600 + D PO), Take 1 tablet by mouth 2 (two) times daily., Disp: , Rfl:    cetirizine (ZYRTEC) 10 MG tablet, Take 1 tablet (10 mg total) by mouth daily., Disp: 30 tablet, Rfl: 11   diclofenac Sodium (VOLTAREN) 1 % GEL, Apply 2 g topically 4 (four) times daily., Disp: 100 g, Rfl: 1   ferrous sulfate 325 (65 FE) MG tablet, Take 325 mg by mouth daily., Disp: , Rfl:    fluorometholone (FML) 0.1 % ophthalmic suspension, Place 1 drop into both eyes every 30 (thirty) days. , Disp: , Rfl:    furosemide (LASIX) 20 MG tablet, Take 1 tablet (20 mg total) by mouth daily. May take an extra 20 mg as needed for lower extremity swelling, Disp: 45 tablet, Rfl: 5   glucose blood (ONETOUCH VERIO) test strip, USE 1 STRIP TO CHECK GLUCOSE TWICE DAILY AS DIRECTED, Disp: 100 each, Rfl: 0   Lancets (ONETOUCH ULTRASOFT) lancets, Use as instructed, Disp: 100 each, Rfl: 12  levothyroxine (SYNTHROID) 75 MCG tablet, Take 1 tablet by mouth once daily, Disp: 90 tablet, Rfl: 0   magnesium oxide (MAGOX 400) 400 (241.3 Mg) MG tablet, Take 1 tablet (400 mg total) by mouth 2 (two) times daily., Disp: 60 tablet, Rfl: 11   metFORMIN (GLUCOPHAGE) 1000 MG tablet, Take 1 tablet by mouth twice daily, Disp: 180 tablet, Rfl: 0   metoprolol tartrate (LOPRESSOR) 100 MG tablet, Take 1 tablet by mouth twice daily, Disp: 180 tablet, Rfl: 0   Multiple Vitamin (MULTIVITAMIN) capsule, Take 1 capsule by mouth daily., Disp: , Rfl:    Omega-3 Fatty Acids (FISH OIL) 1200 MG CAPS, Take 3 capsules by mouth daily., Disp: , Rfl:    omeprazole (PRILOSEC) 40 MG capsule, Take 1 capsule (40 mg total) by mouth 2 (two) times daily for 14 days., Disp: 28 capsule, Rfl: 0    tamsulosin (FLOMAX) 0.4 MG CAPS capsule, Take 1 capsule by mouth once daily, Disp: 30 capsule, Rfl: 0   timolol (TIMOPTIC) 0.5 % ophthalmic solution, Place 1 drop into both eyes daily. , Disp: , Rfl:    triamcinolone ointment (KENALOG) 0.1 %, Apply 1 Application topically 2 (two) times daily. (Patient taking differently: Apply 1 Application topically 2 (two) times daily as needed (itching).), Disp: 90 g, Rfl: 1   warfarin (COUMADIN) 2.5 MG tablet, TAKE 1 TO 1 & 1/2 (ONE & ONE-HALF) TABLETS BY MOUTH ONCE DAILY, Disp: 120 tablet, Rfl: 0  Social History   Tobacco Use  Smoking Status Never  Smokeless Tobacco Never    Allergies  Allergen Reactions   Neomycin-Bacitracin Zn-Polymyx Rash   Penicillins Other (See Comments) and Rash    Pt states this was diagnosed years ago, unsure as to reaction. Will not use.    Enalapril     Stopped 11/2020 due to hyperkalemia due to this   Miconazole Nitrate     Unsure of reaction    Sulfa Antibiotics     Pt not aware of allergy    Objective:  There were no vitals filed for this visit. There is no height or weight on file to calculate BMI. Constitutional Well developed. Well nourished.  Vascular Dorsalis pedis pulses palpable bilaterally. Posterior tibial pulses palpable bilaterally. Capillary refill normal to all digits.  No cyanosis or clubbing noted. Pedal hair growth normal.  Neurologic Normal speech. Oriented to person, place, and time. Epicritic sensation to light touch grossly present bilaterally.  Dermatologic Nails well groomed and normal in appearance. No open wounds. No skin lesions.  Orthopedic: Pain on palpation right first metatarsophalangeal joint.  No redness noted.  No erythema noted.  Some swelling still appreciated pain with range of motion of the big toe joint   Radiographs: None Assessment:   1. Acute idiopathic gout involving toe of right foot   2. Capsulitis of metatarsophalangeal (MTP) joint of right foot    Plan:   Patient was evaluated and treated and all questions answered.  Right first MTP capsulitis with underlying gout flare -All questions and concerns were discussed with the patient extensive detail given the amount of pain that is having a benefit from a steroid injection to help decrease inflammatory component associate with pain.  Patient agrees with following the procedure steroid injection -A steroid injection was performed at right first MTP using 1% plain Lidocaine and 10 mg of Kenalog. This was well tolerated. -I also discussed diet for gout as well in extensive detail to decrease appearing levels in the blood.  He states understanding  will work on   No follow-ups on file.

## 2023-04-27 ENCOUNTER — Ambulatory Visit: Payer: Medicare HMO | Admitting: Podiatry

## 2023-04-29 ENCOUNTER — Ambulatory Visit: Payer: Medicare HMO | Admitting: Family Medicine

## 2023-04-29 ENCOUNTER — Telehealth: Payer: Self-pay

## 2023-04-29 ENCOUNTER — Ambulatory Visit (INDEPENDENT_AMBULATORY_CARE_PROVIDER_SITE_OTHER): Payer: Medicare HMO | Admitting: Family Medicine

## 2023-04-29 ENCOUNTER — Encounter: Payer: Self-pay | Admitting: Family Medicine

## 2023-04-29 VITALS — BP 100/50 | HR 84 | Temp 98.7°F | Ht 70.0 in | Wt 208.2 lb

## 2023-04-29 DIAGNOSIS — R350 Frequency of micturition: Secondary | ICD-10-CM | POA: Diagnosis not present

## 2023-04-29 DIAGNOSIS — R410 Disorientation, unspecified: Secondary | ICD-10-CM | POA: Diagnosis not present

## 2023-04-29 DIAGNOSIS — E1169 Type 2 diabetes mellitus with other specified complication: Secondary | ICD-10-CM

## 2023-04-29 LAB — POCT URINALYSIS DIPSTICK
Bilirubin, UA: NEGATIVE
Blood, UA: NEGATIVE
Glucose, UA: NEGATIVE
Ketones, UA: NEGATIVE
Leukocytes, UA: NEGATIVE
Nitrite, UA: NEGATIVE
Protein, UA: NEGATIVE
Spec Grav, UA: 1.015 (ref 1.010–1.025)
Urobilinogen, UA: 0.2 E.U./dL
pH, UA: 6 (ref 5.0–8.0)

## 2023-04-29 NOTE — Telephone Encounter (Signed)
Pt dropped off  a copy of his medication list  and would like for Dr Beverely Low to look over it and see if he needs to be taking all of the medications . Placed in DR Beverely Low to be revived folder

## 2023-04-29 NOTE — Progress Notes (Signed)
Acute Office Visit   Subjective:  Patient ID: Marvin Foster, male    DOB: 27-Feb-1934, 89 y.o.   MRN: [id number]  Chief Complaint  Patient presents with   Follow-up    Pt is here today and states he went on cruise and came back 7/1 and was positive for COVID Pt has C/O cough, runny nose and would like to talk about  Pt has concerns while on boat of confusion (seeing) things and tearing a napkin that was not there. Pt has C/O of urine frequency     HPI Patient is a 89 year old male that presents with increase urinary frequency. Uncertain if this has got worse.   Patient was recently on cruise, returned on 07/01. He was seen by this provider via telehealth visit for acute gout and covid. He was treated with Prednisone and Paxlovid.  Patient reports he still has a productive cough and runny nose. He is unable to identify the sputum color since he swallows it.  Patients family member reports while on the cruise he had a episode of confusion with seeing things and tearing a napkin that was not there. Denies any recent episodes of confusion since being home.   Denies fever, abd pain, nausea, vomiting, or diarrhea.  Denies hematuria.  ROS See HPI above      Objective:   BP (!) 100/50   Pulse 84   Temp 98.7 F (37.1 C)   Ht 5\' 10"  (1.778 m)   Wt 208 lb 4 oz (94.5 kg)   SpO2 94%   BMI 29.88 kg/m    Physical Exam Vitals reviewed.  Constitutional:      General: He is not in acute distress.    Appearance: Normal appearance. He is not ill-appearing, toxic-appearing or diaphoretic.  HENT:     Head: Normocephalic and atraumatic.     Right Ear: Tympanic membrane, ear canal and external ear normal. There is no impacted cerumen.     Left Ear: Tympanic membrane, ear canal and external ear normal. There is no impacted cerumen.     Ears:     Comments: Wears earring aids     Nose:     Right Sinus: No maxillary sinus tenderness or frontal sinus tenderness.     Left Sinus: No  maxillary sinus tenderness or frontal sinus tenderness.  Eyes:     General:        Right eye: No discharge.        Left eye: No discharge.     Conjunctiva/sclera: Conjunctivae normal.  Cardiovascular:     Rate and Rhythm: Normal rate and regular rhythm.     Heart sounds: Normal heart sounds. No murmur heard.    No friction rub. No gallop.  Pulmonary:     Effort: Pulmonary effort is normal. No respiratory distress.     Breath sounds: Normal breath sounds.  Musculoskeletal:        General: Normal range of motion.  Lymphadenopathy:     Head:     Right side of head: No submental or submandibular adenopathy.     Left side of head: No submental or submandibular adenopathy.  Skin:    General: Skin is warm and dry.  Neurological:     General: No focal deficit present.     Mental Status: He is alert and oriented to person, place, and time. Mental status is at baseline.     Gait: Gait abnormal.  Psychiatric:  Mood and Affect: Mood normal.        Behavior: Behavior normal.        Thought Content: Thought content normal.        Judgment: Judgment normal.        Assessment & Plan:  Urine frequency -     POCT urinalysis dipstick  Confusion -     CBC with Differential/Platelet -     Comprehensive metabolic panel  -Urinalysis is negative for any signs of infection or abnormalities.  -Ordered CBC and CMP for symptoms and previous symptoms. Office will call with lab results.  -Offered antibiotic for possibly secondary infection that can occur from covid. Declined. Will treat if CBC is abnormal.  -Follow up if symptoms reoccur or persist.   Zandra Abts, NP

## 2023-04-29 NOTE — Patient Instructions (Addendum)
-  Urinalysis is negative for any signs of infection or abnormalities.  -Ordered CBC and CMP for symptoms and previous symptoms. Office will call with lab results.  -Offered antibiotic for possibly secondary infection that can occur from covid. Declined. Will treat if CBC is abnormal.  -Follow up if symptoms reoccur or persist.

## 2023-04-30 LAB — COMPREHENSIVE METABOLIC PANEL
AG Ratio: 1.2 (calc) (ref 1.0–2.5)
ALT: 19 U/L (ref 9–46)
AST: 17 U/L (ref 10–35)
Albumin: 3.7 g/dL (ref 3.6–5.1)
Alkaline phosphatase (APISO): 66 U/L (ref 35–144)
BUN/Creatinine Ratio: 19 (calc) (ref 6–22)
BUN: 24 mg/dL (ref 7–25)
CO2: 25 mmol/L (ref 20–32)
Calcium: 8.9 mg/dL (ref 8.6–10.3)
Chloride: 98 mmol/L (ref 98–110)
Creat: 1.26 mg/dL — ABNORMAL HIGH (ref 0.70–1.22)
Globulin: 3 g/dL (calc) (ref 1.9–3.7)
Glucose, Bld: 174 mg/dL — ABNORMAL HIGH (ref 65–99)
Potassium: 5 mmol/L (ref 3.5–5.3)
Sodium: 135 mmol/L (ref 135–146)
Total Bilirubin: 0.8 mg/dL (ref 0.2–1.2)
Total Protein: 6.7 g/dL (ref 6.1–8.1)

## 2023-04-30 LAB — CBC WITH DIFFERENTIAL/PLATELET
Absolute Monocytes: 679 cells/uL (ref 200–950)
Basophils Absolute: 61 cells/uL (ref 0–200)
Basophils Relative: 0.7 %
Eosinophils Absolute: 165 cells/uL (ref 15–500)
Eosinophils Relative: 1.9 %
HCT: 35.4 % — ABNORMAL LOW (ref 38.5–50.0)
Hemoglobin: 11.8 g/dL — ABNORMAL LOW (ref 13.2–17.1)
Lymphs Abs: 2027 cells/uL (ref 850–3900)
MCH: 30.7 pg (ref 27.0–33.0)
MCHC: 33.3 g/dL (ref 32.0–36.0)
MCV: 92.2 fL (ref 80.0–100.0)
MPV: 11 fL (ref 7.5–12.5)
Monocytes Relative: 7.8 %
Neutro Abs: 5768 cells/uL (ref 1500–7800)
Neutrophils Relative %: 66.3 %
Platelets: 199 10*3/uL (ref 140–400)
RBC: 3.84 10*6/uL — ABNORMAL LOW (ref 4.20–5.80)
RDW: 13.1 % (ref 11.0–15.0)
Total Lymphocyte: 23.3 %
WBC: 8.7 10*3/uL (ref 3.8–10.8)

## 2023-05-02 ENCOUNTER — Telehealth: Payer: Self-pay

## 2023-05-02 ENCOUNTER — Other Ambulatory Visit: Payer: Self-pay | Admitting: Family Medicine

## 2023-05-02 NOTE — Telephone Encounter (Signed)
Left vm for pt to call Needs lab appt.

## 2023-05-02 NOTE — Telephone Encounter (Signed)
Called pt to ask if there was a reason for this request.  Pt reports he is unsure if he has blood in his stool Please advise.

## 2023-05-02 NOTE — Telephone Encounter (Signed)
-----   Message from Zandra Abts sent at 05/02/2023  8:03 AM EDT ----- Hemoglobin is slightly low. Are you having your oral iron supplement? Any rectal bleeding or dark stools? Kidney function is slightly low, recommend to hydrate. If you are having any symptoms, recommend to follow up with Dr. Beverely Low if you have new symptoms.

## 2023-05-02 NOTE — Telephone Encounter (Signed)
Lab appointment made for 2 weeks out. Patient is wondering if a stool sample kit can still be sent for him, please advise

## 2023-05-02 NOTE — Telephone Encounter (Signed)
Okay, Would you recommend a stool kit?

## 2023-05-03 ENCOUNTER — Other Ambulatory Visit: Payer: Self-pay

## 2023-05-03 ENCOUNTER — Telehealth: Payer: Self-pay | Admitting: Family Medicine

## 2023-05-03 DIAGNOSIS — D649 Anemia, unspecified: Secondary | ICD-10-CM

## 2023-05-03 DIAGNOSIS — M79672 Pain in left foot: Secondary | ICD-10-CM | POA: Diagnosis not present

## 2023-05-03 DIAGNOSIS — M2012 Hallux valgus (acquired), left foot: Secondary | ICD-10-CM | POA: Diagnosis not present

## 2023-05-03 NOTE — Telephone Encounter (Signed)
Marvin Foster wanted an IFOB test sent to this pt . Marvin Foster sent this to him and I explained that he will need to bring the test here so we can send it in do not mail test he expressed verbal understanding

## 2023-05-03 NOTE — Telephone Encounter (Signed)
Ifob has been sent

## 2023-05-03 NOTE — Telephone Encounter (Signed)
Pt reports he is unsure. 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

## 2023-05-03 NOTE — Telephone Encounter (Signed)
Caller name: DERRY KASSEL  On DPR?: Yes  Call back number: [phone] (mobile)  Provider they see: Sheliah Hatch, MD  Reason for call:   Has lab scheduled in [facility] on 05/13/2023 but like it moved to today here at [facility].

## 2023-05-03 NOTE — Telephone Encounter (Signed)
I spoke to pt and he is having blood work done at [facility] on 05/13/23. He wanted to know if it can moved up earlier per [place] she wanted the CBC repeated in 2 weeks order is in and I explained to him that it was  a reason for the 2 weeks . He was ok with waiting till then and he will see DR Beverely Low tomorrow for gout

## 2023-05-04 ENCOUNTER — Ambulatory Visit (INDEPENDENT_AMBULATORY_CARE_PROVIDER_SITE_OTHER): Payer: Medicare HMO | Admitting: Family Medicine

## 2023-05-04 ENCOUNTER — Encounter: Payer: Self-pay | Admitting: Family Medicine

## 2023-05-04 VITALS — BP 118/64 | HR 81 | Temp 98.4°F | Ht 70.0 in | Wt 207.0 lb

## 2023-05-04 DIAGNOSIS — M109 Gout, unspecified: Secondary | ICD-10-CM | POA: Diagnosis not present

## 2023-05-04 DIAGNOSIS — E1169 Type 2 diabetes mellitus with other specified complication: Secondary | ICD-10-CM

## 2023-05-04 MED ORDER — TAMSULOSIN HCL 0.4 MG PO CAPS: 0.4000 mg | ORAL_CAPSULE | Freq: Every day | ORAL | 0 refills | Status: DC

## 2023-05-04 MED ORDER — ATORVASTATIN CALCIUM 40 MG PO TABS
40.0000 mg | ORAL_TABLET | Freq: Every day | ORAL | 0 refills | Status: DC
Start: 2023-05-04 — End: 2023-10-19

## 2023-05-04 MED ORDER — PREDNISONE 10 MG PO TABS: ORAL_TABLET | ORAL | 0 refills | Status: DC

## 2023-05-04 MED ORDER — METFORMIN HCL 1000 MG PO TABS: 1000.0000 mg | ORAL_TABLET | Freq: Two times a day (BID) | ORAL | 0 refills | Status: DC

## 2023-05-04 MED ORDER — METOPROLOL TARTRATE 100 MG PO TABS: 100.0000 mg | ORAL_TABLET | Freq: Two times a day (BID) | ORAL | 0 refills | Status: DC

## 2023-05-04 NOTE — Progress Notes (Signed)
   Subjective:    Patient ID: Marvin Foster, male    DOB: 1933-11-08, 89 y.o.   MRN: [id number]  HPI Gout- pt had a gout flare on 7/3 and was treated w/ prednisone taper.  Then saw podiatry on 7/12 and had a steroid injxn in R 1st MTP joint.  Pt is now having pain in L great toe for 1-2 weeks.  Went to [facility] yesterday, had xrays, and was told it was gout.  Has never had gout prior to this month.  Started after returning from his cruise.     Review of Systems For ROS see HPI     Objective:   Physical Exam Vitals reviewed.  Constitutional:      General: He is not in acute distress.    Appearance: Normal appearance. He is not ill-appearing.  Musculoskeletal:        General: Swelling (L 1st MTP joint) and tenderness (L 1st MTP joint) present.  Skin:    General: Skin is warm and dry.     Findings: Erythema (L great toe) present.  Neurological:     Mental Status: He is alert. Mental status is at baseline.  Psychiatric:        Mood and Affect: Mood normal.        Behavior: Behavior normal.        Thought Content: Thought content normal.           Assessment & Plan:  Gout- new.  Pt reports that prior to this month he has never had gout.  Sxs started after being on a cruise and eating differently than his usual diet.  Given that he is on anticoagulation, will start Prednisone taper but cautioned him to watch his sugars.  If he has recurrent episodes, will start Allopurinol but suspect this is situational given his recent cruise.  Pt expressed understanding and is in agreement w/ plan.

## 2023-05-04 NOTE — Patient Instructions (Addendum)
Follow up as needed or as scheduled START the Prednisone as directed- 3 pills at the same time x3 days, then 2 pills at the same time x3 days, then 1 pill daily.  Take w/ food  ICE! You can add Almyra Free if you want Call with any questions or concerns- especially if it happens again Stay Safe!  Stay Healthy! Hang in there!!

## 2023-05-06 NOTE — Telephone Encounter (Signed)
This was reviewed and discussed w/ pt at his visit

## 2023-05-10 ENCOUNTER — Ambulatory Visit (INDEPENDENT_AMBULATORY_CARE_PROVIDER_SITE_OTHER): Payer: Medicare HMO

## 2023-05-10 DIAGNOSIS — D649 Anemia, unspecified: Secondary | ICD-10-CM

## 2023-05-11 ENCOUNTER — Telehealth: Payer: Self-pay | Admitting: Family Medicine

## 2023-05-11 ENCOUNTER — Telehealth: Payer: Self-pay

## 2023-05-11 ENCOUNTER — Ambulatory Visit (INDEPENDENT_AMBULATORY_CARE_PROVIDER_SITE_OTHER): Payer: Medicare HMO | Admitting: *Deleted

## 2023-05-11 DIAGNOSIS — Z0000 Encounter for general adult medical examination without abnormal findings: Secondary | ICD-10-CM

## 2023-05-11 NOTE — Telephone Encounter (Signed)
I have informed the pt of the results of his iFOB . He states he can not remember who is GI Dr is .I found notes from [facility] . I gave him their number to call

## 2023-05-11 NOTE — Telephone Encounter (Signed)
-----   Message from Zandra Abts sent at 05/11/2023  2:13 PM EDT ----- Hemoccult test came back positive. He has been seen by [facility]. Recommend patient make a follow up visit with GI for rectal bleeding.

## 2023-05-11 NOTE — Telephone Encounter (Signed)
I have already spoken to pt and he is calling [facility] to schedule an apt .

## 2023-05-11 NOTE — Patient Instructions (Signed)
[name] , Thank you for taking time to come for your Medicare Wellness Visit. I appreciate your ongoing commitment to your health goals. Please review the following plan we discussed and let me know if I can assist you in the future.   Screening recommendations/referrals: Colonoscopy: no longer needed Recommended yearly ophthalmology/optometry visit for glaucoma screening and checkup Recommended yearly dental visit for hygiene and checkup  Vaccinations: Influenza vaccine: up to date Pneumococcal vaccine: up to date Tdap vaccine: up to date Shingles vaccine: up to date    Advanced directives: Education provided    Preventive Care 65 Years and Older, Male Preventive care refers to lifestyle choices and visits with your health care provider that can promote health and wellness. What does preventive care include? A yearly physical exam. This is also called an annual well check. Dental exams once or twice a year. Routine eye exams. Ask your health care provider how often you should have your eyes checked. Personal lifestyle choices, including: Daily care of your teeth and gums. Regular physical activity. Eating a healthy diet. Avoiding tobacco and drug use. Limiting alcohol use. Practicing safe sex. Taking low doses of aspirin every day. Taking vitamin and mineral supplements as recommended by your health care provider. What happens during an annual well check? The services and screenings done by your health care provider during your annual well check will depend on your age, overall health, lifestyle risk factors, and family history of disease. Counseling  Your health care provider may ask you questions about your: Alcohol use. Tobacco use. Drug use. Emotional well-being. Home and relationship well-being. Sexual activity. Eating habits. History of falls. Memory and ability to understand (cognition). Work and work Astronomer. Screening  You may have the following tests  or measurements: Height, weight, and BMI. Blood pressure. Lipid and cholesterol levels. These may be checked every 5 years, or more frequently if you are over 81 years old. Skin check. Lung cancer screening. You may have this screening every year starting at age 60 if you have a 30-pack-year history of smoking and currently smoke or have quit within the past 15 years. Fecal occult blood test (FOBT) of the stool. You may have this test every year starting at age 39. Flexible sigmoidoscopy or colonoscopy. You may have a sigmoidoscopy every 5 years or a colonoscopy every 10 years starting at age 64. Prostate cancer screening. Recommendations will vary depending on your family history and other risks. Hepatitis C blood test. Hepatitis B blood test. Sexually transmitted disease (STD) testing. Diabetes screening. This is done by checking your blood sugar (glucose) after you have not eaten for a while (fasting). You may have this done every 1-3 years. Abdominal aortic aneurysm (AAA) screening. You may need this if you are a current or former smoker. Osteoporosis. You may be screened starting at age 41 if you are at high risk. Talk with your health care provider about your test results, treatment options, and if necessary, the need for more tests. Vaccines  Your health care provider may recommend certain vaccines, such as: Influenza vaccine. This is recommended every year. Tetanus, diphtheria, and acellular pertussis (Tdap, Td) vaccine. You may need a Td booster every 10 years. Zoster vaccine. You may need this after age 35. Pneumococcal 13-valent conjugate (PCV13) vaccine. One dose is recommended after age 42. Pneumococcal polysaccharide (PPSV23) vaccine. One dose is recommended after age 63. Talk to your health care provider about which screenings and vaccines you need and how often you need them.  This information is not intended to replace advice given to you by your health care provider. Make  sure you discuss any questions you have with your health care provider. Document Released: 10/24/2015 Document Revised: 06/16/2016 Document Reviewed: 07/29/2015 Elsevier Interactive Patient Education  2017 [employer].  Fall Prevention in the Home Falls can cause injuries. They can happen to people of all ages. There are many things you can do to make your home safe and to help prevent falls. What can I do on the outside of my home? Regularly fix the edges of walkways and driveways and fix any cracks. Remove anything that might make you trip as you walk through a door, such as a raised step or threshold. Trim any bushes or trees on the path to your home. Use bright outdoor lighting. Clear any walking paths of anything that might make someone trip, such as rocks or tools. Regularly check to see if handrails are loose or broken. Make sure that both sides of any steps have handrails. Any raised decks and porches should have guardrails on the edges. Have any leaves, snow, or ice cleared regularly. Use sand or salt on walking paths during winter. Clean up any spills in your garage right away. This includes oil or grease spills. What can I do in the bathroom? Use night lights. Install grab bars by the toilet and in the tub and shower. Do not use towel bars as grab bars. Use non-skid mats or decals in the tub or shower. If you need to sit down in the shower, use a plastic, non-slip stool. Keep the floor dry. Clean up any water that spills on the floor as soon as it happens. Remove soap buildup in the tub or shower regularly. Attach bath mats securely with double-sided non-slip rug tape. Do not have throw rugs and other things on the floor that can make you trip. What can I do in the bedroom? Use night lights. Make sure that you have a light by your bed that is easy to reach. Do not use any sheets or blankets that are too big for your bed. They should not hang down onto the floor. Have a firm  chair that has side arms. You can use this for support while you get dressed. Do not have throw rugs and other things on the floor that can make you trip. What can I do in the kitchen? Clean up any spills right away. Avoid walking on wet floors. Keep items that you use a lot in easy-to-reach places. If you need to reach something above you, use a strong step stool that has a grab bar. Keep electrical cords out of the way. Do not use floor polish or wax that makes floors slippery. If you must use wax, use non-skid floor wax. Do not have throw rugs and other things on the floor that can make you trip. What can I do with my stairs? Do not leave any items on the stairs. Make sure that there are handrails on both sides of the stairs and use them. Fix handrails that are broken or loose. Make sure that handrails are as long as the stairways. Check any carpeting to make sure that it is firmly attached to the stairs. Fix any carpet that is loose or worn. Avoid having throw rugs at the top or bottom of the stairs. If you do have throw rugs, attach them to the floor with carpet tape. Make sure that you have a light switch at the  top of the stairs and the bottom of the stairs. If you do not have them, ask someone to add them for you. What else can I do to help prevent falls? Wear shoes that: Do not have high heels. Have rubber bottoms. Are comfortable and fit you well. Are closed at the toe. Do not wear sandals. If you use a stepladder: Make sure that it is fully opened. Do not climb a closed stepladder. Make sure that both sides of the stepladder are locked into place. Ask someone to hold it for you, if possible. Clearly mark and make sure that you can see: Any grab bars or handrails. First and last steps. Where the edge of each step is. Use tools that help you move around (mobility aids) if they are needed. These include: Canes. Walkers. Scooters. Crutches. Turn on the lights when you go  into a dark area. Replace any light bulbs as soon as they burn out. Set up your furniture so you have a clear path. Avoid moving your furniture around. If any of your floors are uneven, fix them. If there are any pets around you, be aware of where they are. Review your medicines with your doctor. Some medicines can make you feel dizzy. This can increase your chance of falling. Ask your doctor what other things that you can do to help prevent falls. This information is not intended to replace advice given to you by your health care provider. Make sure you discuss any questions you have with your health care provider. Document Released: 07/24/2009 Document Revised: 03/04/2016 Document Reviewed: 11/01/2014 Elsevier Interactive Patient Education  2017 [employer].

## 2023-05-11 NOTE — Progress Notes (Signed)
Subjective:   Marvin Foster is a 89 y.o. male who presents for Medicare Annual/Subsequent preventive examination.  Visit Complete: Virtual  I connected with  Signa Kell on 05/11/23 by a audio enabled telemedicine application and verified that I am speaking with the correct person using two identifiers.  Patient Location: Home  Provider Location: Home Office  I discussed the limitations of evaluation and management by telemedicine. The patient expressed understanding and agreed to proceed.  Vital Signs: Unable to obtain new vitals due to this being a telehealth visit.    Review of Systems     Cardiac Risk Factors include: advanced age (>8men, >52 women);diabetes mellitus;male gender;hypertension;obesity (BMI >30kg/m2)     Objective:    There were no vitals filed for this visit. There is no height or weight on file to calculate BMI.     05/11/2023   10:36 AM 09/16/2022    9:29 AM 08/26/2022    8:46 AM 08/13/2021    9:59 AM 08/11/2020    9:05 AM 08/01/2019    8:58 AM 07/13/2018    9:44 AM  Advanced Directives  Does Patient Have a Medical Advance Directive? No No No No No Yes No  Type of Careers adviser;Living will   Copy of Healthcare Power of Attorney in Chart?      No - copy requested   Would patient like information on creating a medical advance directive? No - Patient declined No - Patient declined No - Patient declined No - Patient declined Yes (MAU/Ambulatory/Procedural Areas - Information given)  No - Patient declined    Current Medications (verified) Outpatient Encounter Medications as of 05/11/2023  Medication Sig   amLODipine (NORVASC) 2.5 MG tablet Take 1 tablet by mouth once daily   atorvastatin (LIPITOR) 40 MG tablet Take 1 tablet (40 mg total) by mouth at bedtime.   Calcium Carbonate-Vitamin D (CALCIUM 600 + D PO) Take 1 tablet by mouth 2 (two) times daily.   cetirizine (ZYRTEC) 10 MG tablet Take 1 tablet (10 mg  total) by mouth daily.   diclofenac Sodium (VOLTAREN) 1 % GEL Apply 2 g topically 4 (four) times daily.   ferrous sulfate 325 (65 FE) MG tablet Take 325 mg by mouth daily.   fluorometholone (FML) 0.1 % ophthalmic suspension Place 1 drop into both eyes every 30 (thirty) days.    furosemide (LASIX) 20 MG tablet Take 1 tablet (20 mg total) by mouth daily. May take an extra 20 mg as needed for lower extremity swelling   glucose blood (ONETOUCH VERIO) test strip USE 1 STRIP TO CHECK GLUCOSE TWICE DAILY AS DIRECTED   Lancets (ONETOUCH ULTRASOFT) lancets Use as instructed   levothyroxine (SYNTHROID) 75 MCG tablet Take 1 tablet by mouth once daily   magnesium oxide (MAGOX 400) 400 (241.3 Mg) MG tablet Take 1 tablet (400 mg total) by mouth 2 (two) times daily.   metFORMIN (GLUCOPHAGE) 1000 MG tablet Take 1 tablet (1,000 mg total) by mouth 2 (two) times daily.   metoprolol tartrate (LOPRESSOR) 100 MG tablet Take 1 tablet (100 mg total) by mouth 2 (two) times daily.   Multiple Vitamin (MULTIVITAMIN) capsule Take 1 capsule by mouth daily.   Omega-3 Fatty Acids (FISH OIL) 1200 MG CAPS Take 3 capsules by mouth daily.   predniSONE (DELTASONE) 10 MG tablet 3 tabs x3 days and then 2 tabs x3 days and then 1 tab x3 days.  Take w/ food.  tamsulosin (FLOMAX) 0.4 MG CAPS capsule Take 1 capsule (0.4 mg total) by mouth daily.   timolol (TIMOPTIC) 0.5 % ophthalmic solution Place 1 drop into both eyes daily.    triamcinolone ointment (KENALOG) 0.1 % Apply 1 Application topically 2 (two) times daily. (Patient taking differently: Apply 1 Application topically 2 (two) times daily as needed (itching).)   warfarin (COUMADIN) 2.5 MG tablet TAKE 1 TO 1 & 1/2 (ONE & ONE-HALF) TABLETS BY MOUTH ONCE DAILY   omeprazole (PRILOSEC) 40 MG capsule Take 1 capsule (40 mg total) by mouth 2 (two) times daily for 14 days.   No facility-administered encounter medications on file as of 05/11/2023.    Allergies  (verified) Neomycin-bacitracin zn-polymyx, Penicillins, Enalapril, Miconazole nitrate, and Sulfa antibiotics   History: Past Medical History:  Diagnosis Date   Acute respiratory failure due to COVID-19 (HCC) 07/07/2020   ANEMIA 02/18/2010   Qualifier: Diagnosis of  By: Beverely Low MD, Natalia Leatherwood     Arthritis 12/2005   left hand   Arthritis    Left knee   ASPARTATE AMINOTRANSFERASE, SERUM, ELEVATED 11/04/2008   Qualifier: Diagnosis of  By: Beverely Low MD, Katherine     Atrial fibrillation (HCC) 07/24/2013   Blood transfusion without reported diagnosis    Bursitis of knee    right knee   CAD (coronary artery disease)    Cardiac arrhythmia due to congenital heart disease    Chronic anticoagulation 06/12/2015   Coagulation defect (HCC) 05/28/2020   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   CORONARY ARTERY DISEASE 03/27/2007   Qualifier: Diagnosis of  By: Blossom Hoops MD, Luis     Diabetes mellitus    Diverticulosis    Glaucoma    left eye   Hx of colonic polyps 06/12/2015   Hyperlipidemia    Hypertension    Hypothyroid 09/21/2018   Long term current use of anticoagulant therapy 07/24/2013   Neuropathy    with pain   Pain due to onychomycosis of toenail of left foot 05/28/2020   Permanent atrial fibrillation (HCC)    RBBB    Scrotal mass 03/07/2012   Spinal stenosis 01/20/2005   Tubular adenoma of colon    Past Surgical History:  Procedure Laterality Date   BACK SURGERY     BIOPSY  09/16/2022   Procedure: BIOPSY;  Surgeon: Meryl Dare, MD;  Location: [facility];  Service: Gastroenterology;;   CATARACT EXTRACTION  08/11/2009   right   COLONOSCOPY WITH PROPOFOL N/A 09/16/2022   Procedure: COLONOSCOPY WITH PROPOFOL;  Surgeon: Meryl Dare, MD;  Location: [facility];  Service: Gastroenterology;  Laterality: N/A;   corneal endothelial Left 2013   CORNEAL TRANSPLANT Left    eye   CORONARY ANGIOPLASTY WITH STENT PLACEMENT      ESOPHAGOGASTRODUODENOSCOPY (EGD) WITH PROPOFOL N/A 09/16/2022   Procedure: ESOPHAGOGASTRODUODENOSCOPY (EGD) WITH PROPOFOL;  Surgeon: Meryl Dare, MD;  Location: [facility];  Service: Gastroenterology;  Laterality: N/A;   EYE SURGERY     HOT HEMOSTASIS N/A 09/16/2022   Procedure: HOT HEMOSTASIS (ARGON PLASMA COAGULATION/BICAP);  Surgeon: Meryl Dare, MD;  Location: [facility];  Service: Gastroenterology;  Laterality: N/A;   POLYPECTOMY  09/16/2022   Procedure: POLYPECTOMY;  Surgeon: Meryl Dare, MD;  Location: [facility];  Service: Gastroenterology;;   ROTATOR CUFF REPAIR     SMALL INTESTINE SURGERY     SPINE SURGERY     Family History  Problem Relation Age of Onset   Heart failure Mother  Heart attack age 54   Heart failure Father        Heart attack age 59   Stomach cancer Neg Hx    Colon cancer Neg Hx    Pancreatic cancer Neg Hx    Throat cancer Neg Hx    Social History   Socioeconomic History   Marital status: Divorced    Spouse name: Not on file   Number of children: 5   Years of education: Not on file   Highest education level: 12th grade  Occupational History   Occupation: retired   Occupation: retired   Occupation: Retired from [employer]  Tobacco Use   Smoking status: Never   Smokeless tobacco: Never  Vaping Use   Vaping status: Never Used  Substance and Sexual Activity   Alcohol use: Not Currently    Comment: Very seldom---socially   Drug use: No   Sexual activity: Not Currently    Partners: Female  Other Topics Concern   Not on file  Social History Narrative   Volunteers at [employer]         Social Determinants of Health   Financial Resource Strain: Low Risk  (05/11/2023)   Overall Financial Resource Strain (CARDIA)    Difficulty of Paying Living Expenses: Not hard at all  Food Insecurity: No Food Insecurity (05/11/2023)   Hunger Vital Sign    Worried About Running Out of Food in the Last Year: Never true    Ran Out of  Food in the Last Year: Never true  Transportation Needs: No Transportation Needs (05/11/2023)   PRAPARE - Administrator, Civil Service (Medical): No    Lack of Transportation (Non-Medical): No  Physical Activity: Inactive (05/11/2023)   Exercise Vital Sign    Days of Exercise per Week: 0 days    Minutes of Exercise per Session: 0 min  Stress: No Stress Concern Present (05/11/2023)   [employer] of Occupational Health - Occupational Stress Questionnaire    Feeling of Stress : Not at all  Social Connections: Moderately Isolated (05/11/2023)   Social Connection and Isolation Panel [NHANES]    Frequency of Communication with Friends and Family: More than three times a week    Frequency of Social Gatherings with Friends and Family: Once a week    Attends Religious Services: Never    Database administrator or Organizations: Yes    Attends Engineer, structural: More than 4 times per year    Marital Status: Divorced    Tobacco Counseling Counseling given: Not Answered   Clinical Intake:  Pre-visit preparation completed: Yes  Pain : No/denies pain     Diabetes: Yes CBG done?: No Did pt. bring in CBG monitor from home?: No     Interpreter Needed?: No  Information entered by :: Remi Haggard LPN   Activities of Daily Living    05/11/2023   10:37 AM 08/26/2022    8:53 AM  In your present state of health, do you have any difficulty performing the following activities:  Hearing? 1 0  Vision? 0 0  Difficulty concentrating or making decisions? 1 0  Walking or climbing stairs? 0 1  Dressing or bathing? 0 0  Doing errands, shopping? 0 0  Preparing Food and eating ? N N  Using the Toilet? N N  In the past six months, have you accidently leaked urine? N N  Do you have problems with loss of bowel control? N N  Managing your Medications? N  N  Managing your Finances? N N  Housekeeping or managing your Housekeeping? N N    Patient Care Team: Sheliah Hatch, MD as PCP - General Jake Bathe, MD as PCP - Cardiology (Cardiology) Jake Bathe, MD as Consulting Physician (Cardiology) [facility] Jani Files, [facility] (Specialist) Helane Gunther, DPM as Consulting Physician (Podiatry) Sedalia Muta, PT as Physical Therapist (Physical Therapy) Erroll Luna, [facility] (Inactive) (Pharmacist)  Indicate any recent Medical Services you may have received from other than [facility] providers in the past year (date may be approximate).     Assessment:   This is a routine wellness examination for Marko.  Hearing/Vision screen Hearing Screening - Comments:: Hearing aids bilateral Vision Screening - Comments:: Up to date Ashley Royalty for [facility]   Dietary issues and exercise activities discussed:     Goals Addressed             This Visit's Progress    Patient Stated       Stay alive       Depression Screen    05/11/2023   10:41 AM 05/04/2023   10:55 AM 01/17/2023    8:33 AM 08/26/2022    8:52 AM 08/25/2022    2:07 PM 05/28/2022    1:27 PM 12/10/2021    8:59 AM  PHQ 2/9 Scores  PHQ - 2 Score 0 0 0 0 0 0 0  PHQ- 9 Score 0  0 0 0 0 0    Fall Risk    05/11/2023   10:34 AM 05/04/2023   10:55 AM 04/29/2023    1:16 PM 04/13/2023    1:50 PM 01/17/2023    8:33 AM  Fall Risk   Falls in the past year? 0 0 0 0 0  Number falls in past yr: 0 0 0 0 0  Injury with Fall? 0 0 0 0 0  Risk for fall due to :  No Fall Risks No Fall Risks No Fall Risks No Fall Risks  Follow up Falls evaluation completed;Education provided;Falls prevention discussed Falls evaluation completed Falls evaluation completed Falls evaluation completed Falls evaluation completed    MEDICARE RISK AT HOME:  Medicare Risk at Home - 05/11/23 1035     Any stairs in or around the home? No    If so, are there any without handrails? No    Home free of loose throw rugs in walkways, pet beds, electrical cords, etc? Yes    Adequate lighting in your  home to reduce risk of falls? Yes    Life alert? No    Use of a cane, walker or w/c? No   has a walker does not use often   Grab bars in the bathroom? Yes    Shower chair or bench in shower? Yes    Elevated toilet seat or a handicapped toilet? Yes             TIMED UP AND GO:  Was the test performed?  No    Cognitive Function:    02/24/2018    9:12 AM 09/25/2015    1:32 PM  MMSE - Mini Mental State Exam  Orientation to time 5 5  Orientation to Place 5 5  Registration 3 3  Attention/ Calculation 5 5  Recall 3 2  Language- name 2 objects 2 2  Language- repeat 1 1  Language- follow 3 step command 3 3  Language- read & follow direction 1 1  Write a  sentence 1 1  Copy design 1 1  Total score 30 29        05/11/2023   10:37 AM 08/26/2022    8:49 AM 08/11/2020    9:10 AM  6CIT Screen  What Year? 0 points 0 points 0 points  What month? 0 points 0 points 0 points  What time? 0 points 0 points 0 points  Count back from 20 0 points 0 points 0 points  Months in reverse 0 points 0 points 0 points  Repeat phrase 0 points 0 points 0 points  Total Score 0 points 0 points 0 points    Immunizations Immunization History  Administered Date(s) Administered   Fluad Quad(high Dose 65+) 07/14/2019, 07/18/2020, 06/22/2021   Influenza Split 08/06/2015, 07/25/2022   Influenza Whole 08/14/2007, 07/23/2009, 07/10/2010, 08/11/2016   Influenza, High Dose Seasonal PF 07/30/2016   Influenza,inj,Quad PF,6+ Mos 08/14/2013, 08/13/2014, 08/10/2018   Influenza,inj,quad, With Preservative 11/11/2017   Influenza-Unspecified 08/06/2015, 08/04/2017, 06/22/2021   PFIZER(Purple Top)SARS-COV-2 Vaccination 12/04/2019, 12/25/2019, 11/24/2020   Pneumococcal Conjugate-13 01/16/2015   Pneumococcal Polysaccharide-23 12/22/2009, 01/26/2017   Pneumococcal-Unspecified 10/11/2016   Respiratory Syncytial Virus Vaccine,Recomb Aduvanted(Arexvy) 12/28/2022   Tdap 08/28/2014   Zoster Recombinant(Shingrix)  10/31/2018, 04/06/2019   Zoster, Live 08/20/2010    TDAP status: Up to date  Flu Vaccine status: Up to date  Pneumococcal vaccine status: Up to date  Covid-19 vaccine status: Information provided on how to obtain vaccines.   Qualifies for Shingles Vaccine? No   Zostavax completed Yes   Shingrix Completed?: Yes  Screening Tests Health Maintenance  Topic Date Due   INFLUENZA VACCINE  05/12/2023   FOOT EXAM  06/29/2023   HEMOGLOBIN A1C  07/19/2023   OPHTHALMOLOGY EXAM  09/28/2023   Medicare Annual Wellness (AWV)  05/10/2024   DTaP/Tdap/Td (2 - Td or Tdap) 08/28/2024   Pneumonia Vaccine 7+ Years old  Completed   Zoster Vaccines- Shingrix  Completed   HPV VACCINES  Aged Out   COVID-19 Vaccine  Discontinued    Health Maintenance  There are no preventive care reminders to display for this patient.  Colorectal cancer screening: No longer required.   Lung Cancer Screening: (Low Dose CT Chest recommended if Age 36-80 years, 20 pack-year currently smoking OR have quit w/in 15years.) does not qualify.   Lung Cancer Screening Referral:   Additional Screening:  Hepatitis C Screening: does not qualify;  Vision Screening: Recommended annual ophthalmology exams for early detection of glaucoma and other disorders of the eye. Is the patient up to date with their annual eye exam?  Yes  Who is the provider or what is the name of the office in which the patient attends annual eye exams? [email or web address] If pt is not established with a provider, would they like to be referred to a provider to establish care? No .   Dental Screening: Recommended annual dental exams for proper oral hygiene  Nutrition Risk Assessment:  Has the patient had any N/V/D within the last 2 months?  No  Does the patient have any non-healing wounds?  No  Has the patient had any unintentional weight loss or weight gain?  No   Diabetes:  Is the patient diabetic?  Yes  If diabetic, was a CBG obtained today?  No  Did  the patient bring in their glucometer from home?  No  How often do you monitor your CBG's? 1 x a day.   Financial Strains and Diabetes Management:  Are you having any financial strains with the  device, your supplies or your medication? No .  Does the patient want to be seen by Chronic Care Management for management of their diabetes?  No  Would the patient like to be referred to a Nutritionist or for Diabetic Management?  No   Diabetic Exams:  Diabetic Eye Exam: Completed  Pt has been advised about the importance in completing this exam. A referral has been placed today. Message sent to referral coordinator for scheduling purposes. Advised pt to expect a call from office referred to regarding appt.  Diabetic Foot Exam:  Pt has been advised about the importance in completing this exam.   Community Resource Referral / Chronic Care Management: CRR required this visit?  No   CCM required this visit?  No     Plan:     I have personally reviewed and noted the following in the patient's chart:   Medical and social history Use of alcohol, tobacco or illicit drugs  Current medications and supplements including opioid prescriptions. Patient is not currently taking opioid prescriptions. Functional ability and status Nutritional status Physical activity Advanced directives List of other physicians Hospitalizations, surgeries, and ER visits in previous 12 months Vitals Screenings to include cognitive, depression, and falls Referrals and appointments  In addition, I have reviewed and discussed with patient certain preventive protocols, quality metrics, and best practice recommendations. A written personalized care plan for preventive services as well as general preventive health recommendations were provided to patient.     Remi Haggard, LPN   1/61/0960   After Visit Summary: (MyChart) Due to this being a telephonic visit, the after visit summary with patients personalized plan was  offered to patient via MyChart   Nurse Notes:

## 2023-05-11 NOTE — Telephone Encounter (Signed)
Pt had colonoscopy w/ Dr Russella Dar in 2023.  Given that he has a + iFOB test, he needs to go back to GI for a re-evaluation

## 2023-05-11 NOTE — Telephone Encounter (Signed)
Lab called and stated that this patients iFob was positive. It looks like [place] ordered it but this is a [facility] patient. I let the lab know that I would make them both aware.

## 2023-05-13 ENCOUNTER — Other Ambulatory Visit: Payer: Medicare HMO

## 2023-05-16 ENCOUNTER — Telehealth: Payer: Self-pay | Admitting: Family Medicine

## 2023-05-16 NOTE — Telephone Encounter (Signed)
Left pt a vm to return our call

## 2023-05-16 NOTE — Telephone Encounter (Signed)
Pt returned my call and he wanted to know what his iron level was in 04/29/23. I gave him his reading

## 2023-05-16 NOTE — Telephone Encounter (Signed)
Caller name: BAYLEE WHITEHALL  On DPR?: Yes  Call back number: [phone] (mobile)  Provider they see: Sheliah Hatch, MD  Reason for call:  Review lab results discuss anemia and increasing iron

## 2023-05-17 ENCOUNTER — Ambulatory Visit: Payer: Medicare HMO | Admitting: *Deleted

## 2023-05-17 DIAGNOSIS — Z5181 Encounter for therapeutic drug level monitoring: Secondary | ICD-10-CM

## 2023-05-17 DIAGNOSIS — I4891 Unspecified atrial fibrillation: Secondary | ICD-10-CM

## 2023-05-17 LAB — POCT INR: INR: 3.7 — AB (ref 2.0–3.0)

## 2023-05-17 NOTE — Patient Instructions (Addendum)
Description   *DOES NOT NEED A PRINTOUT*  Do not take any warfarin today then continue taking warfarin 1 tablet daily. Stay consistent with greens each week (3 per week)  Repeat INR in 3 weeks. Call us with any medication changes or concerns # [phone] [facility]

## 2023-05-18 ENCOUNTER — Other Ambulatory Visit: Payer: Self-pay | Admitting: Family Medicine

## 2023-05-19 ENCOUNTER — Encounter (INDEPENDENT_AMBULATORY_CARE_PROVIDER_SITE_OTHER): Payer: Medicare HMO | Admitting: Ophthalmology

## 2023-05-24 ENCOUNTER — Encounter (INDEPENDENT_AMBULATORY_CARE_PROVIDER_SITE_OTHER): Payer: Medicare HMO | Admitting: Ophthalmology

## 2023-05-24 DIAGNOSIS — H35033 Hypertensive retinopathy, bilateral: Secondary | ICD-10-CM

## 2023-05-24 DIAGNOSIS — H353221 Exudative age-related macular degeneration, left eye, with active choroidal neovascularization: Secondary | ICD-10-CM | POA: Diagnosis not present

## 2023-05-24 DIAGNOSIS — I10 Essential (primary) hypertension: Secondary | ICD-10-CM

## 2023-05-24 DIAGNOSIS — H35371 Puckering of macula, right eye: Secondary | ICD-10-CM | POA: Diagnosis not present

## 2023-05-24 DIAGNOSIS — H353112 Nonexudative age-related macular degeneration, right eye, intermediate dry stage: Secondary | ICD-10-CM

## 2023-05-24 DIAGNOSIS — H43813 Vitreous degeneration, bilateral: Secondary | ICD-10-CM

## 2023-05-25 ENCOUNTER — Ambulatory Visit (INDEPENDENT_AMBULATORY_CARE_PROVIDER_SITE_OTHER): Payer: Medicare HMO | Admitting: Family Medicine

## 2023-05-25 ENCOUNTER — Encounter: Payer: Self-pay | Admitting: Family Medicine

## 2023-05-25 VITALS — BP 124/72 | HR 83 | Temp 97.7°F | Resp 18 | Ht 70.0 in | Wt 210.2 lb

## 2023-05-25 DIAGNOSIS — R600 Localized edema: Secondary | ICD-10-CM | POA: Diagnosis not present

## 2023-05-25 LAB — BASIC METABOLIC PANEL
BUN: 20 mg/dL (ref 6–23)
CO2: 31 mEq/L (ref 19–32)
Calcium: 9.4 mg/dL (ref 8.4–10.5)
Chloride: 96 mEq/L (ref 96–112)
Creatinine, Ser: 1.04 mg/dL (ref 0.40–1.50)
GFR: 64.58 mL/min (ref >60.00)
Glucose, Bld: 105 mg/dL — ABNORMAL HIGH (ref 70–99)
Potassium: 4.2 mEq/L (ref 3.5–5.1)
Sodium: 134 mEq/L — ABNORMAL LOW (ref 135–145)

## 2023-05-25 LAB — HEPATIC FUNCTION PANEL
ALT: 19 U/L (ref 0–53)
AST: 22 U/L (ref 0–37)
Albumin: 4 g/dL (ref 3.5–5.2)
Alkaline Phosphatase: 55 U/L (ref 39–117)
Bilirubin, Direct: 0.2 mg/dL (ref 0.0–0.3)
Total Bilirubin: 0.8 mg/dL (ref 0.2–1.2)
Total Protein: 7 g/dL (ref 6.0–8.3)

## 2023-05-25 LAB — BRAIN NATRIURETIC PEPTIDE: Pro B Natriuretic peptide (BNP): 223 pg/mL — ABNORMAL HIGH (ref 0.0–100.0)

## 2023-05-25 LAB — URIC ACID: Uric Acid, Serum: 7.7 mg/dL (ref 4.0–7.8)

## 2023-05-25 NOTE — Patient Instructions (Signed)
Follow up as needed or as scheduled We'll notify you of your lab results and make any changes if needed Increase your water intake Elevate your legs when sitting Take and EXTRA Furosemide (Lasix) today when you get home Once we have the lab results, we'll determine how much Lasix and how long Call with any questions or concerns Hang in there!!

## 2023-05-25 NOTE — Progress Notes (Signed)
   Subjective:    Patient ID: Marvin Foster, male    DOB: 04/12/1934, 89 y.o.   MRN: [id number]  HPI Foot pain and swelling- has pain and swelling in both feet but L is more painful.  'it's been a problem for quite some time'.  Area has not been red or swollen like it was when Marvin Foster had gout.   Review of Systems For ROS see HPI     Objective:   Physical Exam Vitals reviewed.  Constitutional:      General: Marvin Foster is not in acute distress.    Appearance: Normal appearance. Marvin Foster is not ill-appearing.  Cardiovascular:     Rate and Rhythm: Normal rate. Rhythm irregular.  Pulmonary:     Effort: Pulmonary effort is normal. No respiratory distress.     Breath sounds: No wheezing, rhonchi or rales.  Musculoskeletal:     Right lower leg: Edema (2+ pitting edema) present.     Left lower leg: Edema (2+ pitting edema) present.  Skin:    General: Skin is warm and dry.     Comments: Discoloration of lower extremities consistent w/ venous stasis  Neurological:     Mental Status: Marvin Foster is alert.           Assessment & Plan:  Bilateral LE edema- deteriorated.  pt's sxs are consistent w/ volume overload and venous stasis.  Check labs to assess for CHF, electrolyte disturbance, liver abnormality.  Will likely increase Furosemide to 2 tabs daily but will first assess Cr and K.  Reviewed supportive care and red flags that should prompt return.  Pt expressed understanding and is in agreement w/ plan.

## 2023-05-26 ENCOUNTER — Telehealth: Payer: Self-pay

## 2023-05-26 NOTE — Telephone Encounter (Signed)
-----   Message from Neena Rhymes sent at 05/26/2023  7:34 AM EDT ----- Your potassium and kidney function look good.  Please increase the Furosemide (Lasix) to 2 pills (40mg  daily) through the weekend and then drop down to 1 pill on Monday.  I would like to see you next week if possible, to make sure things are improving.

## 2023-05-26 NOTE — Telephone Encounter (Signed)
Left pt a VM to call office for results

## 2023-05-30 NOTE — Telephone Encounter (Signed)
 Called patient to discuss lab work, no answer, left a message for the patient to call back to discuss or review by MyChart if that is prefered.

## 2023-06-03 ENCOUNTER — Encounter: Payer: Self-pay | Admitting: Family Medicine

## 2023-06-03 ENCOUNTER — Ambulatory Visit (INDEPENDENT_AMBULATORY_CARE_PROVIDER_SITE_OTHER): Payer: Medicare HMO | Admitting: Family Medicine

## 2023-06-03 VITALS — BP 128/82 | HR 95 | Temp 98.2°F | Resp 18 | Ht 70.0 in | Wt 211.2 lb

## 2023-06-03 DIAGNOSIS — R600 Localized edema: Secondary | ICD-10-CM

## 2023-06-03 DIAGNOSIS — K117 Disturbances of salivary secretion: Secondary | ICD-10-CM | POA: Diagnosis not present

## 2023-06-03 LAB — BASIC METABOLIC PANEL
BUN: 17 mg/dL (ref 6–23)
CO2: 28 mEq/L (ref 19–32)
Calcium: 8.8 mg/dL (ref 8.4–10.5)
Chloride: 98 mEq/L (ref 96–112)
Creatinine, Ser: 1.04 mg/dL (ref 0.40–1.50)
GFR: 64.57 mL/min (ref >60.00)
Glucose, Bld: 240 mg/dL — ABNORMAL HIGH (ref 70–99)
Potassium: 4.1 mEq/L (ref 3.5–5.1)
Sodium: 137 mEq/L (ref 135–145)

## 2023-06-03 LAB — BRAIN NATRIURETIC PEPTIDE: Pro B Natriuretic peptide (BNP): 232 pg/mL — ABNORMAL HIGH (ref 0.0–100.0)

## 2023-06-03 MED ORDER — FUROSEMIDE 40 MG PO TABS: 40.0000 mg | ORAL_TABLET | Freq: Every day | ORAL | 1 refills | Status: DC

## 2023-06-03 NOTE — Patient Instructions (Signed)
Follow up as needed or as scheduled We'll notify you of your lab results and make any changes if needed INCREASE the Furosemide (Lasix) to 2 pills daily (40mg ) and 1 of the new prescription (40mg ) Try and elevate your legs when sitting Monitor the drooling and let me know if anything changes.  I think it's just some muscle weakness/relaxation when you're not focused on it Call with any questions or concerns Have a great weekend!

## 2023-06-03 NOTE — Progress Notes (Unsigned)
   Subjective:    Patient ID: Marvin Foster, male    DOB: 20-May-1934, 89 y.o.   MRN: [id number]  HPI LE edema- last week we had him double his Lasix to 40mg  daily through the weekend.  Pt feels that doubling the pills was helpful- had less of a sock line last night when removing shoes and socks.  Tightness and discomfort have improved.  Drooling on L side- sxs started 'several weeks ago'.  Sxs will come and go.  No trouble w/ speech or forming words.  No issues w/ chewing.    Review of Systems For ROS see HPI     Objective:   Physical Exam Vitals reviewed.  Constitutional:      General: He is not in acute distress.    Appearance: Normal appearance. He is obese. He is not ill-appearing.  HENT:     Head: Normocephalic and atraumatic.  Eyes:     Extraocular Movements: Extraocular movements intact.     Conjunctiva/sclera: Conjunctivae normal.  Musculoskeletal:     Right lower leg: Edema (1+ pitting edema) present.     Left lower leg: Edema (1+ pitting edema) present.  Skin:    General: Skin is warm and dry.  Neurological:     General: No focal deficit present.     Mental Status: He is alert and oriented to person, place, and time.     Cranial Nerves: No cranial nerve deficit (no drooling seen during exam, CNs intact).  Psychiatric:        Mood and Affect: Mood normal.        Behavior: Behavior normal.        Thought Content: Thought content normal.           Assessment & Plan:  Bilateral LE edema- pt reports sxs improved w/ higher dose of Lasix but again started swelling when he decreased his dose.  Will check BMP today and if Cr and K+ allow, will plan to increase dose to 40mg  daily.  Encouraged him to elevate his legs while sitting and limit his salt intake.  Will continue to follow.  Drooling- new.  Only L side.  No facial asymmetry or CN defect noted.  No drooling in office today.  I suspect that when he is not paying attention, his mouth goes slack and that is when  he drools.  No difficulty w/ speech or swallowing.  No further work up at this time.

## 2023-06-06 ENCOUNTER — Telehealth: Payer: Self-pay

## 2023-06-06 NOTE — Telephone Encounter (Signed)
Left results on pt VM  

## 2023-06-06 NOTE — Telephone Encounter (Signed)
-----   Message from Neena Rhymes sent at 06/06/2023  4:00 PM EDT ----- Labs are stable.  Continue the higher 40mg  dose of Furosemide (Lasix)

## 2023-06-07 ENCOUNTER — Ambulatory Visit: Payer: Medicare HMO | Attending: Cardiovascular Disease | Admitting: *Deleted

## 2023-06-07 ENCOUNTER — Other Ambulatory Visit: Payer: Self-pay | Admitting: Family Medicine

## 2023-06-07 DIAGNOSIS — I4891 Unspecified atrial fibrillation: Secondary | ICD-10-CM | POA: Diagnosis not present

## 2023-06-07 DIAGNOSIS — Z5181 Encounter for therapeutic drug level monitoring: Secondary | ICD-10-CM | POA: Diagnosis not present

## 2023-06-07 LAB — POCT INR: POC INR: 3.9

## 2023-06-07 NOTE — Patient Instructions (Signed)
Description   *DOES NOT NEED A PRINTOUT*  Do not take any warfarin today then continue taking warfarin 1 tablet daily except for 1/2 a tablet on Tuesdays.  Stay consistent with greens each week (3 per week). Repeat INR in 3 weeks. Call us with any medication changes or concerns # [phone] [facility]

## 2023-06-16 ENCOUNTER — Ambulatory Visit (HOSPITAL_COMMUNITY): Payer: Medicare HMO | Attending: Internal Medicine

## 2023-06-16 DIAGNOSIS — I350 Nonrheumatic aortic (valve) stenosis: Secondary | ICD-10-CM | POA: Diagnosis not present

## 2023-06-16 LAB — ECHOCARDIOGRAM COMPLETE
AR max vel: 0.67 cm2
AV Area VTI: 0.5 cm2
AV Area mean vel: 0.58 cm2
AV Mean grad: 37 mmHg
AV Peak grad: 46.7 mmHg
Ao pk vel: 3.42 m/s
MV M vel: 5.69 m/s
MV Peak grad: 129.3 mmHg
S' Lateral: 3 cm

## 2023-06-20 ENCOUNTER — Encounter: Payer: Self-pay | Admitting: Cardiovascular Disease

## 2023-06-20 ENCOUNTER — Ambulatory Visit: Payer: Medicare HMO | Attending: Cardiovascular Disease | Admitting: Cardiovascular Disease

## 2023-06-20 ENCOUNTER — Encounter (INDEPENDENT_AMBULATORY_CARE_PROVIDER_SITE_OTHER): Payer: Medicare HMO | Admitting: Ophthalmology

## 2023-06-20 VITALS — BP 108/66 | HR 73 | Ht 70.0 in | Wt 208.0 lb

## 2023-06-20 DIAGNOSIS — I10 Essential (primary) hypertension: Secondary | ICD-10-CM

## 2023-06-20 DIAGNOSIS — H35033 Hypertensive retinopathy, bilateral: Secondary | ICD-10-CM | POA: Diagnosis not present

## 2023-06-20 DIAGNOSIS — H43813 Vitreous degeneration, bilateral: Secondary | ICD-10-CM

## 2023-06-20 DIAGNOSIS — H35371 Puckering of macula, right eye: Secondary | ICD-10-CM

## 2023-06-20 DIAGNOSIS — H353112 Nonexudative age-related macular degeneration, right eye, intermediate dry stage: Secondary | ICD-10-CM | POA: Diagnosis not present

## 2023-06-20 DIAGNOSIS — H353221 Exudative age-related macular degeneration, left eye, with active choroidal neovascularization: Secondary | ICD-10-CM

## 2023-06-20 DIAGNOSIS — I350 Nonrheumatic aortic (valve) stenosis: Secondary | ICD-10-CM | POA: Diagnosis not present

## 2023-06-20 NOTE — Patient Instructions (Signed)
Medication Instructions:  NO CHANGES *If you need a refill on your cardiac medications before your next appointment, please call your pharmacy*   Lab Work: NONE If you have labs (blood work) drawn today and your tests are completely normal, you will receive your results only by: MyChart Message (if you have MyChart) OR A paper copy in the mail If you have any lab test that is abnormal or we need to change your treatment, we will call you to review the results.   Testing/Procedures: DUE IN 6 MONTHS BEFORE NEXT VISIT [facility] Your physician has requested that you have an echocardiogram. Echocardiography is a painless test that uses sound waves to create images of your heart. It provides your doctor with information about the size and shape of your heart and how well your heart's chambers and valves are working. This procedure takes approximately one hour. There are no restrictions for this procedure. Please do NOT wear cologne, perfume, aftershave, or lotions (deodorant is allowed). Please arrive 15 minutes prior to your appointment time.    Follow-Up: At [facility], you and your health needs are our priority.  As part of our continuing mission to provide you with exceptional heart care, we have created designated Provider Care Teams.  These Care Teams include your primary Cardiologist (physician) and Advanced Practice Providers (APPs -  Physician Assistants and Nurse Practitioners) who all work together to provide you with the care you need, when you need it.   Your next appointment:   6 month(s)  Provider:   Verne Carrow, MD  - after next echo

## 2023-06-20 NOTE — Progress Notes (Signed)
Structural Heart Clinic Note  Chief Complaint  Patient presents with   Follow-up    Severe aortic stenosis    History of Present Illness: 89 yo male with history of persistent atrial fibrillation, CAD, DM, hyperlipidemia, HTN, hypothyroidism and RBBB who is here today for follow up. I saw him as a new consult in the structural heart clinic in February 2024. He has persistent atrial fibrillation and is on coumadin. He has CAD and had an LAD stent placed in 2006. He has been followed for moderate aortic stenosis by Dr. Anne Fu. Echo January 2024 with LVEF=60-65%. Severe aortic stenosis with mean gradient 36.5 mmHg, AVA 0.9 cm2, DI 0.26, SVI 32. He was feeling well when I met him in February 2024 so we elected to follow his aortic stenosis. Repeat echo in September 2024 with LVEF=55-60%. RV function normal. Mild mitral regurgitation. Severe aortic valve stenosis with mean gradient 37 mmHg, AVA 0.5 cm2, DI 0.18, SVI 21.   He is here today for follow up. He denies any chest pain, dyspnea, palpitations, lower extremity edema, orthopnea, PND, dizziness, near syncope or syncope. He has chronic LE edema. He lives in [place] alone. He is retired from [employer]. He has dentures and no active dental issues.   Primary Care Physician: Sheliah Hatch, MD Primary Cardiologist: Anne Fu Referring Cardiologist: Anne Fu  Past Medical History:  Diagnosis Date   Acute respiratory failure due to COVID-19 [facility]) 07/07/2020   ANEMIA 02/18/2010   Qualifier: Diagnosis of  By: Beverely Low MD, Natalia Leatherwood     Arthritis 12/2005   left hand   Arthritis    Left knee   ASPARTATE AMINOTRANSFERASE, SERUM, ELEVATED 11/04/2008   Qualifier: Diagnosis of  By: Beverely Low MD, Katherine     Atrial fibrillation (HCC) 07/24/2013   Blood transfusion without reported diagnosis    Bursitis of knee    right knee   CAD (coronary artery disease)    Cardiac arrhythmia due to congenital heart disease    Chronic anticoagulation  06/12/2015   Coagulation defect (HCC) 05/28/2020   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   CORONARY ARTERY DISEASE 03/27/2007   Qualifier: Diagnosis of  By: Blossom Hoops MD, Luis     Diabetes mellitus    Diverticulosis    Glaucoma    left eye   Hx of colonic polyps 06/12/2015   Hyperlipidemia    Hypertension    Hypothyroid 09/21/2018   Long term current use of anticoagulant therapy 07/24/2013   Neuropathy    with pain   Pain due to onychomycosis of toenail of left foot 05/28/2020   Permanent atrial fibrillation (HCC)    RBBB    Scrotal mass 03/07/2012   Spinal stenosis 01/20/2005   Tubular adenoma of colon     Past Surgical History:  Procedure Laterality Date   BACK SURGERY     BIOPSY  09/16/2022   Procedure: BIOPSY;  Surgeon: Meryl Dare, MD;  Location: [facility];  Service: Gastroenterology;;   CATARACT EXTRACTION  08/11/2009   right   COLONOSCOPY WITH PROPOFOL N/A 09/16/2022   Procedure: COLONOSCOPY WITH PROPOFOL;  Surgeon: Meryl Dare, MD;  Location: [facility];  Service: Gastroenterology;  Laterality: N/A;   corneal endothelial Left 2013   CORNEAL TRANSPLANT Left    eye   CORONARY ANGIOPLASTY WITH STENT PLACEMENT     ESOPHAGOGASTRODUODENOSCOPY (EGD) WITH PROPOFOL N/A 09/16/2022   Procedure: ESOPHAGOGASTRODUODENOSCOPY (EGD) WITH PROPOFOL;  Surgeon: Meryl Dare, MD;  Location: [facility];  Service: Gastroenterology;  Laterality: N/A;   EYE SURGERY     HOT HEMOSTASIS N/A 09/16/2022   Procedure: HOT HEMOSTASIS (ARGON PLASMA COAGULATION/BICAP);  Surgeon: Meryl Dare, MD;  Location: [facility];  Service: Gastroenterology;  Laterality: N/A;   POLYPECTOMY  09/16/2022   Procedure: POLYPECTOMY;  Surgeon: Meryl Dare, MD;  Location: [facility];  Service: Gastroenterology;;   ROTATOR CUFF REPAIR     SMALL INTESTINE SURGERY     SPINE SURGERY      Current Outpatient Medications  Medication Sig Dispense Refill    amLODipine (NORVASC) 2.5 MG tablet Take 1 tablet by mouth once daily 90 tablet 2   atorvastatin (LIPITOR) 40 MG tablet Take 1 tablet (40 mg total) by mouth at bedtime. 90 tablet 0   Calcium Carbonate-Vitamin D (CALCIUM 600 + D PO) Take 1 tablet by mouth 2 (two) times daily.     cetirizine (ZYRTEC) 10 MG tablet Take 1 tablet (10 mg total) by mouth daily. 30 tablet 11   diclofenac Sodium (VOLTAREN) 1 % GEL Apply 2 g topically 4 (four) times daily. 100 g 1   ferrous sulfate 325 (65 FE) MG tablet Take 325 mg by mouth daily.     fluorometholone (FML) 0.1 % ophthalmic suspension Place 1 drop into both eyes every 30 (thirty) days.      furosemide (LASIX) 40 MG tablet Take 1 tablet (40 mg total) by mouth daily. 90 tablet 1   glucose blood (ONETOUCH VERIO) test strip USE 1 STRIP TO CHECK GLUCOSE TWICE DAILY AS DIRECTED 100 each 0   Lancets (ONETOUCH ULTRASOFT) lancets Use as instructed 100 each 12   levothyroxine (SYNTHROID) 75 MCG tablet Take 1 tablet by mouth once daily 90 tablet 0   magnesium oxide (MAGOX 400) 400 (241.3 Mg) MG tablet Take 1 tablet (400 mg total) by mouth 2 (two) times daily. 60 tablet 11   metFORMIN (GLUCOPHAGE) 1000 MG tablet Take 1 tablet (1,000 mg total) by mouth 2 (two) times daily. 180 tablet 0   metoprolol tartrate (LOPRESSOR) 100 MG tablet Take 1 tablet (100 mg total) by mouth 2 (two) times daily. 180 tablet 0   Multiple Vitamin (MULTIVITAMIN) capsule Take 1 capsule by mouth daily.     Omega-3 Fatty Acids (FISH OIL) 1200 MG CAPS Take 3 capsules by mouth daily.     tamsulosin (FLOMAX) 0.4 MG CAPS capsule Take 1 capsule by mouth once daily 30 capsule 0   timolol (TIMOPTIC) 0.5 % ophthalmic solution Place 1 drop into both eyes daily.      warfarin (COUMADIN) 2.5 MG tablet TAKE 1 TO 1 & 1/2 (ONE & ONE-HALF) TABLETS BY MOUTH ONCE DAILY 120 tablet 0   omeprazole (PRILOSEC) 40 MG capsule Take 1 capsule (40 mg total) by mouth 2 (two) times daily for 14 days. 28 capsule 0   No current  facility-administered medications for this visit.    Allergies  Allergen Reactions   Neomycin-Bacitracin Zn-Polymyx Rash   Penicillins Other (See Comments) and Rash    Pt states this was diagnosed years ago, unsure as to reaction. Will not use.    Enalapril     Stopped 11/2020 due to hyperkalemia due to this   Miconazole Nitrate     Unsure of reaction    Sulfa Antibiotics     Pt not aware of allergy     Social History   Socioeconomic History   Marital status: Divorced    Spouse name: Not on file  Number of children: 5   Years of education: Not on file   Highest education level: 12th grade  Occupational History   Occupation: retired   Occupation: retired   Occupation: Retired from [employer]  Tobacco Use   Smoking status: Never   Smokeless tobacco: Never  Vaping Use   Vaping status: Never Used  Substance and Sexual Activity   Alcohol use: Not Currently    Comment: Very seldom---socially   Drug use: No   Sexual activity: Not Currently    Partners: Female  Other Topics Concern   Not on file  Social History Narrative   Volunteers at [employer]         Social Determinants of Health   Financial Resource Strain: Low Risk  (05/11/2023)   Overall Financial Resource Strain (CARDIA)    Difficulty of Paying Living Expenses: Not hard at all  Food Insecurity: No Food Insecurity (05/11/2023)   Hunger Vital Sign    Worried About Running Out of Food in the Last Year: Never true    Ran Out of Food in the Last Year: Never true  Transportation Needs: No Transportation Needs (05/11/2023)   PRAPARE - Administrator, Civil Service (Medical): No    Lack of Transportation (Non-Medical): No  Physical Activity: Inactive (05/11/2023)   Exercise Vital Sign    Days of Exercise per Week: 0 days    Minutes of Exercise per Session: 0 min  Stress: No Stress Concern Present (05/11/2023)   [employer] of Occupational Health - Occupational Stress Questionnaire    Feeling of  Stress : Not at all  Social Connections: Moderately Isolated (05/11/2023)   Social Connection and Isolation Panel [NHANES]    Frequency of Communication with Friends and Family: More than three times a week    Frequency of Social Gatherings with Friends and Family: Once a week    Attends Religious Services: Never    Database administrator or Organizations: Yes    Attends Engineer, structural: More than 4 times per year    Marital Status: Divorced  Intimate Partner Violence: Not At Risk (05/11/2023)   Humiliation, Afraid, Rape, and Kick questionnaire    Fear of Current or Ex-Partner: No    Emotionally Abused: No    Physically Abused: No    Sexually Abused: No    Family History  Problem Relation Age of Onset   Heart failure Mother        Heart attack age 87   Heart failure Father        Heart attack age 74   Stomach cancer Neg Hx    Colon cancer Neg Hx    Pancreatic cancer Neg Hx    Throat cancer Neg Hx     Review of Systems:  As stated in the HPI and otherwise negative.   BP 108/66   Pulse 73   Ht 5\' 10"  (1.778 m)   Wt 94.3 kg   SpO2 94%   BMI 29.84 kg/m   Physical Examination: General: Well developed, well nourished, NAD  HEENT: OP clear, mucus membranes moist  SKIN: warm, dry. No rashes. Neuro: No focal deficits  Musculoskeletal: Muscle strength 5/5 all ext  Psychiatric: Mood and affect normal  Neck: No JVD, no carotid bruits, no thyromegaly, no lymphadenopathy.  Lungs:Clear bilaterally, no wheezes, rhonci, crackles Cardiovascular: Irregular irregular. Harsh systolic murmur.  Abdomen:Soft. Bowel sounds present. Non-tender.  Extremities: No lower extremity edema. Pulses are 2 + in the bilateral DP/PT.  EKG:  EKG is ordered today. The ekg ordered today demonstrates  EKG Interpretation Date/Time:  Monday June 20 2023 14:27:07 EDT Ventricular Rate:  73 PR Interval:    QRS Duration:  160 QT Interval:  444 QTC Calculation: 489 R Axis:   -5  Text  Interpretation: Atrial fibrillation Right bundle branch block When compared with ECG of 06-Jul-2020 23:58, PREVIOUS ECG IS PRESENT Confirmed by Verne Carrow [phone]) on 06/20/2023 2:38:15 PM    Echo September 2024: 1. Left ventricular ejection fraction, by estimation, is 55 to 60%. The  left ventricle has normal function. The left ventricle has no regional  wall motion abnormalities. Left ventricular diastolic parameters are  indeterminate.   2. Right ventricular systolic function is normal. The right ventricular  size is mildly enlarged. There is mildly elevated pulmonary artery  systolic pressure. The estimated right ventricular systolic pressure is  40.3 mmHg.   3. Left atrial size was mild to moderately dilated.   4. Right atrial size was mildly dilated.   5. The mitral valve is normal in structure. Mild mitral valve  regurgitation. No evidence of mitral stenosis. Moderate mitral annular  calcification.   6. The aortic valve is tricuspid. There is severe calcifcation of the  aortic valve. Aortic valve regurgitation is not visualized. Severe aortic  valve stenosis. Aortic valve area, by VTI measures 0.50 cm. Aortic valve  mean gradient measures 37.0 mmHg.   7. Aortic dilatation noted. There is mild dilatation of the ascending  aorta, measuring 39 mm.   8. The inferior vena cava is dilated in size with >50% respiratory  variability, suggesting right atrial pressure of 8 mmHg.   9. The patient was in atrial fibrillation.   FINDINGS   Left Ventricle: Left ventricular ejection fraction, by estimation, is 55  to 60%. The left ventricle has normal function. The left ventricle has no  regional wall motion abnormalities. The left ventricular internal cavity  size was normal in size. There is   no left ventricular hypertrophy. Left ventricular diastolic parameters  are indeterminate.   Right Ventricle: The right ventricular size is mildly enlarged. No  increase in right  ventricular wall thickness. Right ventricular systolic  function is normal. There is mildly elevated pulmonary artery systolic  pressure. The tricuspid regurgitant  velocity is 2.84 m/s, and with an assumed right atrial pressure of 8 mmHg,  the estimated right ventricular systolic pressure is 40.3 mmHg.   Left Atrium: Left atrial size was mild to moderately dilated.   Right Atrium: Right atrial size was mildly dilated.   Pericardium: There is no evidence of pericardial effusion.   Mitral Valve: The mitral valve is normal in structure. There is mild  calcification of the mitral valve leaflet(s). Moderate mitral annular  calcification. Mild mitral valve regurgitation. No evidence of mitral  valve stenosis.   Tricuspid Valve: The tricuspid valve is normal in structure. Tricuspid  valve regurgitation is mild.   Aortic Valve: The aortic valve is tricuspid. There is severe calcifcation  of the aortic valve. Aortic valve regurgitation is not visualized. Severe  aortic stenosis is present. Aortic valve mean gradient measures 37.0 mmHg.  Aortic valve peak gradient  measures 46.7 mmHg. Aortic valve area, by VTI measures 0.50 cm.   Pulmonic Valve: The pulmonic valve was normal in structure. Pulmonic valve  regurgitation is not visualized.   Aorta: The aortic root is normal in size and structure and aortic  dilatation noted. There is mild dilatation of the ascending aorta,  measuring 39 mm.   Venous: The inferior vena cava is dilated in size with greater than 50%  respiratory variability, suggesting right atrial pressure of 8 mmHg.   IAS/Shunts: No atrial level shunt detected by color flow Doppler.     LEFT VENTRICLE  PLAX 2D  LVIDd:         5.00 cm  LVIDs:         3.00 cm  LV PW:         0.90 cm  LV IVS:        0.90 cm  LVOT diam:     1.90 cm  LV SV:         45  LV SV Index:   21  LVOT Area:     2.84 cm     RIGHT VENTRICLE             IVC  RV Basal diam:  5.20 cm     IVC  diam: 2.10 cm  RV S prime:     12.70 cm/s  TAPSE (M-mode): 2.7 cm   LEFT ATRIUM             Index        RIGHT ATRIUM           Index  LA diam:        5.20 cm 2.43 cm/m   RA Area:     18.50 cm  LA Vol (A2C):   65.0 ml 30.42 ml/m  RA Volume:   53.80 ml  25.18 ml/m  LA Vol (A4C):   76.1 ml 35.62 ml/m  LA Biplane Vol: 71.1 ml 33.28 ml/m   AORTIC VALVE  AV Area (Vmax):    0.67 cm  AV Area (Vmean):   0.58 cm  AV Area (VTI):     0.50 cm  AV Vmax:           341.60 cm/s  AV Vmean:          270.400 cm/s  AV VTI:            0.895 m  AV Peak Grad:      46.7 mmHg  AV Mean Grad:      37.0 mmHg  LVOT Vmax:         81.00 cm/s  LVOT Vmean:        55.000 cm/s  LVOT VTI:          0.159 m  LVOT/AV VTI ratio: 0.18    AORTA  Ao Root diam: 3.40 cm  Ao Asc diam:  3.90 cm   MR Peak grad: 129.3 mmHg    TRICUSPID VALVE  MR Mean grad: 94.5 mmHg     TR Peak grad:   32.3 mmHg  MR Vmax:      568.50 cm/s   TR Vmax:        284.00 cm/s  MR Vmean:     468.5 cm/s  MV E velocity: 113.67 cm/s  SHUNTS                              Systemic VTI:  0.16 m                              Systemic Diam: 1.90 cm    Recent Labs: 01/17/2023: TSH 3.41 04/29/2023: Hemoglobin 11.8; Platelets 199 05/25/2023: ALT 19 06/03/2023: BUN 17; Creatinine, Ser  1.04; Potassium 4.1; Pro B Natriuretic peptide (BNP) 232.0; Sodium 137    Wt Readings from Last 3 Encounters:  06/20/23 94.3 kg  06/03/23 95.8 kg  05/25/23 95.4 kg    Assessment and Plan:   1. Severe Aortic Valve Stenosis: He has severe stage C aortic stenosis. I have personally reviewed the echo images.  The aortic valve leaflets are thickened and calcified with reduced leaflet excursion but the leaflets open on short axis views. NYHA class 1-2 symptoms. I think he would benefit from AVR but at this time he remains asymptomatic and does not wish to consider any procedure. Given advanced age, he is not a good candidate for conventional AVR by surgical approach. I think  he may be a good candidate for TAVR when he becomes symptomatic.   I have reviewed the natural history of aortic stenosis with the patient and their family members  who are present today. We have discussed the limitations of medical therapy and the poor prognosis associated with symptomatic aortic stenosis. We have reviewed potential treatment options, including palliative medical therapy, conventional surgical aortic valve replacement, and transcatheter aortic valve replacement. We discussed treatment options in the context of the patient's specific comorbid medical conditions.   Repeat echo in 6 months. He will call with any change in his symptoms.   Labs/ tests ordered today include:  Orders Placed This Encounter  Procedures   EKG 12-Lead   ECHOCARDIOGRAM COMPLETE   Disposition:   F/U with me in 6 months  Signed, Verne Carrow, MD, [facility] 06/20/2023 2:44 PM    [facility] [address] [place], [place], [place]  [address] Phone: [phone]; Fax: [phone]

## 2023-06-28 ENCOUNTER — Ambulatory Visit: Payer: Medicare HMO | Attending: Cardiology | Admitting: *Deleted

## 2023-06-28 DIAGNOSIS — Z5181 Encounter for therapeutic drug level monitoring: Secondary | ICD-10-CM | POA: Diagnosis not present

## 2023-06-28 DIAGNOSIS — I4891 Unspecified atrial fibrillation: Secondary | ICD-10-CM | POA: Diagnosis not present

## 2023-06-28 LAB — POCT INR: POC INR: 3.4

## 2023-06-28 NOTE — Patient Instructions (Signed)
Description   *DOES NOT NEED A PRINTOUT*  Do not take any warfarin today then START taking warfarin 1 tablet daily except for 1/2 a tablet on Tuesdays and Saturdays.  Stay consistent with greens each week (3 per week). Repeat INR in 3 weeks. Call us with any medication changes or concerns # [phone] Coumadin Clinic

## 2023-07-03 ENCOUNTER — Encounter: Payer: Self-pay | Admitting: Family Medicine

## 2023-07-04 NOTE — Telephone Encounter (Signed)
Patient is asking if you recommend the COVID-19 booster, flu and pneumonia shots and if he can/should do them at the same time.  Please advise I know the flu can be given with either of those however I am unsure about all three

## 2023-07-13 ENCOUNTER — Ambulatory Visit: Payer: Medicare HMO | Admitting: Podiatry

## 2023-07-13 ENCOUNTER — Encounter: Payer: Self-pay | Admitting: Podiatry

## 2023-07-13 DIAGNOSIS — M7751 Other enthesopathy of right foot: Secondary | ICD-10-CM

## 2023-07-13 DIAGNOSIS — M10071 Idiopathic gout, right ankle and foot: Secondary | ICD-10-CM | POA: Diagnosis not present

## 2023-07-13 NOTE — Progress Notes (Signed)
Subjective:  Patient ID: Marvin Foster, male    DOB: 03-03-1934,  MRN: [id number]  Chief Complaint  Patient presents with   Nail Problem    Nail trim     89 y.o. male presents with the above complaint.  Patient presents with follow-up of right first metatarsophalangeal joint gout flare capsulitis.  Patient states is doing better but he still has some pain in the big toe joint.  The injection helped considerably.  Denies any other acute issues.  He wanted to discuss diet options again.   Review of Systems: Negative except as noted in the HPI. Denies N/V/F/Ch.  Past Medical History:  Diagnosis Date   Acute respiratory failure due to COVID-19 (HCC) 07/07/2020   ANEMIA 02/18/2010   Qualifier: Diagnosis of  By: Beverely Low MD, Natalia Leatherwood     Arthritis 12/2005   left hand   Arthritis    Left knee   ASPARTATE AMINOTRANSFERASE, SERUM, ELEVATED 11/04/2008   Qualifier: Diagnosis of  By: Beverely Low MD, Katherine     Atrial fibrillation (HCC) 07/24/2013   Blood transfusion without reported diagnosis    Bursitis of knee    right knee   CAD (coronary artery disease)    Cardiac arrhythmia due to congenital heart disease    Chronic anticoagulation 06/12/2015   Coagulation defect (HCC) 05/28/2020   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   CORONARY ARTERY DISEASE 03/27/2007   Qualifier: Diagnosis of  By: Blossom Hoops MD, Luis     Diabetes mellitus    Diverticulosis    Glaucoma    left eye   Hx of colonic polyps 06/12/2015   Hyperlipidemia    Hypertension    Hypothyroid 09/21/2018   Long term current use of anticoagulant therapy 07/24/2013   Neuropathy    with pain   Pain due to onychomycosis of toenail of left foot 05/28/2020   Permanent atrial fibrillation (HCC)    RBBB    Scrotal mass 03/07/2012   Spinal stenosis 01/20/2005   Tubular adenoma of colon     Current Outpatient Medications:    amLODipine (NORVASC) 2.5 MG tablet, Take 1 tablet by mouth once  daily, Disp: 90 tablet, Rfl: 2   atorvastatin (LIPITOR) 40 MG tablet, Take 1 tablet (40 mg total) by mouth at bedtime., Disp: 90 tablet, Rfl: 0   Calcium Carbonate-Vitamin D (CALCIUM 600 + D PO), Take 1 tablet by mouth 2 (two) times daily., Disp: , Rfl:    cetirizine (ZYRTEC) 10 MG tablet, Take 1 tablet (10 mg total) by mouth daily., Disp: 30 tablet, Rfl: 11   diclofenac Sodium (VOLTAREN) 1 % GEL, Apply 2 g topically 4 (four) times daily., Disp: 100 g, Rfl: 1   ferrous sulfate 325 (65 FE) MG tablet, Take 325 mg by mouth daily., Disp: , Rfl:    fluorometholone (FML) 0.1 % ophthalmic suspension, Place 1 drop into both eyes every 30 (thirty) days. , Disp: , Rfl:    furosemide (LASIX) 40 MG tablet, Take 1 tablet (40 mg total) by mouth daily., Disp: 90 tablet, Rfl: 1   glucose blood (ONETOUCH VERIO) test strip, USE 1 STRIP TO CHECK GLUCOSE TWICE DAILY AS DIRECTED, Disp: 100 each, Rfl: 0   Lancets (ONETOUCH ULTRASOFT) lancets, Use as instructed, Disp: 100 each, Rfl: 12   levothyroxine (SYNTHROID) 75 MCG tablet, Take 1 tablet by mouth once daily, Disp: 90 tablet, Rfl: 0   magnesium oxide (MAGOX 400) 400 (241.3 Mg) MG tablet, Take 1 tablet (400 mg  total) by mouth 2 (two) times daily., Disp: 60 tablet, Rfl: 11   metFORMIN (GLUCOPHAGE) 1000 MG tablet, Take 1 tablet (1,000 mg total) by mouth 2 (two) times daily., Disp: 180 tablet, Rfl: 0   metoprolol tartrate (LOPRESSOR) 100 MG tablet, Take 1 tablet (100 mg total) by mouth 2 (two) times daily., Disp: 180 tablet, Rfl: 0   Multiple Vitamin (MULTIVITAMIN) capsule, Take 1 capsule by mouth daily., Disp: , Rfl:    Omega-3 Fatty Acids (FISH OIL) 1200 MG CAPS, Take 3 capsules by mouth daily., Disp: , Rfl:    omeprazole (PRILOSEC) 40 MG capsule, Take 1 capsule (40 mg total) by mouth 2 (two) times daily for 14 days., Disp: 28 capsule, Rfl: 0   tamsulosin (FLOMAX) 0.4 MG CAPS capsule, Take 1 capsule by mouth once daily, Disp: 30 capsule, Rfl: 0   timolol (TIMOPTIC) 0.5  % ophthalmic solution, Place 1 drop into both eyes daily. , Disp: , Rfl:    warfarin (COUMADIN) 2.5 MG tablet, TAKE 1 TO 1 & 1/2 (ONE & ONE-HALF) TABLETS BY MOUTH ONCE DAILY, Disp: 120 tablet, Rfl: 0  Social History   Tobacco Use  Smoking Status Never  Smokeless Tobacco Never    Allergies  Allergen Reactions   Neomycin-Bacitracin Zn-Polymyx Rash   Penicillins Other (See Comments) and Rash    Pt states this was diagnosed years ago, unsure as to reaction. Will not use.    Enalapril     Stopped 11/2020 due to hyperkalemia due to this   Miconazole Nitrate     Unsure of reaction    Sulfa Antibiotics     Pt not aware of allergy    Objective:  There were no vitals filed for this visit. There is no height or weight on file to calculate BMI. Constitutional Well developed. Well nourished.  Vascular Dorsalis pedis pulses palpable bilaterally. Posterior tibial pulses palpable bilaterally. Capillary refill normal to all digits.  No cyanosis or clubbing noted. Pedal hair growth normal.  Neurologic Normal speech. Oriented to person, place, and time. Epicritic sensation to light touch grossly present bilaterally.  Dermatologic Nails well groomed and normal in appearance. No open wounds. No skin lesions.  Orthopedic: Pain on palpation right first metatarsophalangeal joint.  No redness noted.  No erythema noted.  Some swelling still appreciated pain with range of motion of the big toe joint   Radiographs: None Assessment:   No diagnosis found.  Plan:  Patient was evaluated and treated and all questions answered.  Right first MTP capsulitis with underlying gout flare -All questions and concerns were discussed with the patient extensive detail given the amount of pain that is having a benefit from a steroid injection to help decrease inflammatory component associate with pain.  Patient agrees with following the procedure steroid injection -A steroid injection was performed at right first  MTP using 1% plain Lidocaine and 10 mg of Kenalog. This was well tolerated. -I also discussed diet for gout as well in extensive detail to decrease appearing levels in the blood.  He states understanding will work on   No follow-ups on file.

## 2023-07-19 ENCOUNTER — Ambulatory Visit: Payer: Medicare HMO | Attending: Cardiology

## 2023-07-19 ENCOUNTER — Other Ambulatory Visit: Payer: Self-pay | Admitting: Family Medicine

## 2023-07-19 ENCOUNTER — Other Ambulatory Visit: Payer: Self-pay | Admitting: Cardiology

## 2023-07-19 DIAGNOSIS — I4891 Unspecified atrial fibrillation: Secondary | ICD-10-CM | POA: Diagnosis not present

## 2023-07-19 DIAGNOSIS — Z5181 Encounter for therapeutic drug level monitoring: Secondary | ICD-10-CM

## 2023-07-19 LAB — POCT INR: INR: 2.4 (ref 2.0–3.0)

## 2023-07-19 NOTE — Telephone Encounter (Signed)
Warfarin 2.5mg  refill AFIB Last INR 07/19/23 Last OV 06/20/23

## 2023-07-19 NOTE — Patient Instructions (Signed)
*  DOES NOT NEED A PRINTOUT*  Continue taking warfarin 1 tablet daily except for 1/2 a tablet on Tuesdays and Saturdays.  Stay consistent with greens each week (3 per week). Repeat INR in 4 weeks. Call us with any medication changes or concerns # [phone] [facility]

## 2023-07-20 ENCOUNTER — Encounter (INDEPENDENT_AMBULATORY_CARE_PROVIDER_SITE_OTHER): Payer: Medicare HMO | Admitting: Ophthalmology

## 2023-07-20 ENCOUNTER — Ambulatory Visit: Payer: Medicare HMO | Admitting: Family Medicine

## 2023-07-20 ENCOUNTER — Encounter: Payer: Self-pay | Admitting: Family Medicine

## 2023-07-20 VITALS — BP 118/64 | HR 50 | Temp 98.3°F | Ht 68.0 in | Wt 207.5 lb

## 2023-07-20 DIAGNOSIS — E1139 Type 2 diabetes mellitus with other diabetic ophthalmic complication: Secondary | ICD-10-CM

## 2023-07-20 DIAGNOSIS — Z7984 Long term (current) use of oral hypoglycemic drugs: Secondary | ICD-10-CM | POA: Diagnosis not present

## 2023-07-20 DIAGNOSIS — H353112 Nonexudative age-related macular degeneration, right eye, intermediate dry stage: Secondary | ICD-10-CM

## 2023-07-20 DIAGNOSIS — I10 Essential (primary) hypertension: Secondary | ICD-10-CM | POA: Diagnosis not present

## 2023-07-20 DIAGNOSIS — H353221 Exudative age-related macular degeneration, left eye, with active choroidal neovascularization: Secondary | ICD-10-CM

## 2023-07-20 DIAGNOSIS — H35033 Hypertensive retinopathy, bilateral: Secondary | ICD-10-CM

## 2023-07-20 DIAGNOSIS — Z0001 Encounter for general adult medical examination with abnormal findings: Secondary | ICD-10-CM

## 2023-07-20 DIAGNOSIS — Z23 Encounter for immunization: Secondary | ICD-10-CM | POA: Diagnosis not present

## 2023-07-20 DIAGNOSIS — H47212 Primary optic atrophy, left eye: Secondary | ICD-10-CM

## 2023-07-20 DIAGNOSIS — H43813 Vitreous degeneration, bilateral: Secondary | ICD-10-CM | POA: Diagnosis not present

## 2023-07-20 DIAGNOSIS — Z0000 Encounter for general adult medical examination without abnormal findings: Secondary | ICD-10-CM

## 2023-07-20 LAB — CBC WITH DIFFERENTIAL/PLATELET
Basophils Absolute: 0.1 10*3/uL (ref 0.0–0.1)
Basophils Relative: 0.6 % (ref 0.0–3.0)
Eosinophils Absolute: 0.4 10*3/uL (ref 0.0–0.7)
Eosinophils Relative: 4.4 % (ref 0.0–5.0)
HCT: 39.2 % (ref 39.0–52.0)
Hemoglobin: 12.7 g/dL — ABNORMAL LOW (ref 13.0–17.0)
Lymphocytes Relative: 26.8 % (ref 12.0–46.0)
Lymphs Abs: 2.3 10*3/uL (ref 0.7–4.0)
MCHC: 32.5 g/dL (ref 30.0–36.0)
MCV: 93.3 fL (ref 78.0–100.0)
Monocytes Absolute: 0.7 10*3/uL (ref 0.1–1.0)
Monocytes Relative: 8.5 % (ref 3.0–12.0)
Neutro Abs: 5.2 10*3/uL (ref 1.4–7.7)
Neutrophils Relative %: 59.7 % (ref 43.0–77.0)
Platelets: 184 10*3/uL (ref 150.0–400.0)
RBC: 4.2 Mil/uL — ABNORMAL LOW (ref 4.22–5.81)
RDW: 14.9 % (ref 11.5–15.5)
WBC: 8.7 10*3/uL (ref 4.0–10.5)

## 2023-07-20 LAB — HEMOGLOBIN A1C: Hgb A1c MFr Bld: 6.9 % — ABNORMAL HIGH (ref 4.6–6.5)

## 2023-07-20 NOTE — Patient Instructions (Signed)
Follow up in 6 months to recheck blood pressure, cholesterol, and sugar We'll notify you of your lab results and make any changes if needed Continue to work on healthy diet and regular physical activity as able Call with any questions or concerns Stay Safe!  Stay Healthy! Happy Fall!!!

## 2023-07-20 NOTE — Progress Notes (Signed)
Subjective:    Patient ID: Marvin Foster, male    DOB: May 03, 1934, 89 y.o.   MRN: [id number]  HPI CPE- UTD on eye exam, PNA, Tdap.  Flu shot given today  Patient Care Team    Relationship Specialty Notifications Start End  Sheliah Hatch, MD PCP - General   09/23/10   Jake Bathe, MD PCP - Cardiology Cardiology Admissions 11/01/18   Jake Bathe, MD Consulting Physician Cardiology  05/09/14   [facility]    01/26/17   [facility]    01/26/17   [facility]  01/26/17   Helane Gunther, DPM Consulting Physician Podiatry  02/24/18   Sedalia Muta, PT Physical Therapist Physical Therapy  07/20/18   Erroll Luna, [facility] (Inactive)  Pharmacist  09/09/21    Comment: [phone]    Health Maintenance  Topic Date Due   INFLUENZA VACCINE  05/12/2023   HEMOGLOBIN A1C  07/19/2023   OPHTHALMOLOGY EXAM  09/28/2023   Medicare Annual Wellness (AWV)  05/10/2024   FOOT EXAM  07/19/2024   DTaP/Tdap/Td (2 - Td or Tdap) 08/28/2024   Pneumonia Vaccine 23+ Years old  Completed   Zoster Vaccines- Shingrix  Completed   HPV VACCINES  Aged Out   COVID-19 Vaccine  Discontinued      Review of Systems Patient reports no vision/ hearing changes, adenopathy,fever, weight change,  persistant/recurrent hoarseness , swallowing issues, chest pain, palpitations, edema, persistant/recurrent cough, hemoptysis, dyspnea (rest/exertional/paroxysmal nocturnal), gastrointestinal bleeding (melena, rectal bleeding), abdominal pain, significant heartburn, bowel changes, GU symptoms (dysuria, hematuria, incontinence), Gyn symptoms (abnormal  bleeding, pain),  syncope, focal weakness, memory loss, skin/hair/nail changes, abnormal bruising or bleeding, anxiety, or depression.   + neuropathy    Objective:   Physical Exam General Appearance:    Alert, cooperative, no distress, appears stated age, obese  Head:    Normocephalic, without obvious abnormality, atraumatic  Eyes:    PERRL,  conjunctiva/corneas clear, EOM's intact both eyes       Ears:    Normal TM's and external ear canals, both ears  Nose:   Nares normal, septum midline, mucosa normal, no drainage   or sinus tenderness  Throat:   Lips, mucosa, and tongue normal; teeth and gums normal  Neck:   Supple, symmetrical, trachea midline, no adenopathy;       thyroid:  No enlargement/tenderness/nodules  Back:     Symmetric, no curvature, ROM normal, no CVA tenderness  Lungs:     Clear to auscultation bilaterally, respirations unlabored  Chest wall:    No tenderness or deformity  Heart:    Irregular S1 and S2   Abdomen:     Soft, non-tender, bowel sounds active all four quadrants,    no masses, no organomegaly  Genitalia:    deferred  Rectal:    Extremities:   Extremities normal, atraumatic, no cyanosis.  Bilateral LE edema  Pulses:   2+ and symmetric all extremities  Skin:   Skin color, texture, turgor normal, no rashes or lesions  Lymph nodes:   Cervical, supraclavicular, and axillary nodes normal  Neurologic:   CNII-XII intact. Normal strength, sensation and reflexes      throughout          Assessment & Plan:

## 2023-07-21 ENCOUNTER — Ambulatory Visit: Payer: Medicare HMO | Admitting: Physician Assistant

## 2023-07-21 ENCOUNTER — Encounter: Payer: Self-pay | Admitting: Physician Assistant

## 2023-07-21 VITALS — BP 104/60 | HR 75 | Ht 67.0 in | Wt 208.2 lb

## 2023-07-21 DIAGNOSIS — I350 Nonrheumatic aortic (valve) stenosis: Secondary | ICD-10-CM | POA: Diagnosis not present

## 2023-07-21 DIAGNOSIS — D509 Iron deficiency anemia, unspecified: Secondary | ICD-10-CM | POA: Diagnosis not present

## 2023-07-21 DIAGNOSIS — Z7901 Long term (current) use of anticoagulants: Secondary | ICD-10-CM | POA: Diagnosis not present

## 2023-07-21 DIAGNOSIS — I4891 Unspecified atrial fibrillation: Secondary | ICD-10-CM

## 2023-07-21 DIAGNOSIS — R195 Other fecal abnormalities: Secondary | ICD-10-CM | POA: Diagnosis not present

## 2023-07-21 LAB — HEPATIC FUNCTION PANEL
ALT: 25 U/L (ref 0–53)
AST: 24 U/L (ref 0–37)
Albumin: 4.2 g/dL (ref 3.5–5.2)
Alkaline Phosphatase: 54 U/L (ref 39–117)
Bilirubin, Direct: 0.3 mg/dL (ref 0.0–0.3)
Total Bilirubin: 1 mg/dL (ref 0.2–1.2)
Total Protein: 6.9 g/dL (ref 6.0–8.3)

## 2023-07-21 LAB — LIPID PANEL
Cholesterol: 99 mg/dL (ref 0–200)
HDL: 32.8 mg/dL — ABNORMAL LOW (ref >39.00)
LDL Cholesterol: 40 mg/dL (ref 0–99)
NonHDL: 65.88
Total CHOL/HDL Ratio: 3
Triglycerides: 131 mg/dL (ref 0.0–149.0)
VLDL: 26.2 mg/dL (ref 0.0–40.0)

## 2023-07-21 LAB — BASIC METABOLIC PANEL
BUN: 27 mg/dL — ABNORMAL HIGH (ref 6–23)
CO2: 33 meq/L — ABNORMAL HIGH (ref 19–32)
Calcium: 9.9 mg/dL (ref 8.4–10.5)
Chloride: 99 meq/L (ref 96–112)
Creatinine, Ser: 1.13 mg/dL (ref 0.40–1.50)
GFR: 58.39 mL/min — ABNORMAL LOW (ref >60.00)
Glucose, Bld: 159 mg/dL — ABNORMAL HIGH (ref 70–99)
Potassium: 5.1 meq/L (ref 3.5–5.1)
Sodium: 139 meq/L (ref 135–145)

## 2023-07-21 LAB — TSH: TSH: 1.07 u[IU]/mL (ref 0.35–5.50)

## 2023-07-21 NOTE — Progress Notes (Signed)
Addendum: Reviewed and agree with assessment and management plan. Kadijah Shamoon M, MD  

## 2023-07-21 NOTE — Progress Notes (Signed)
Chief Complaint: Hemoccult positive stool  HPI:    Mr. Chong is an 89 year old male with a past medical history as listed below including CAD on Coumadin, severe aortic stenosis (06/16/2023 echo with LVEF 55-60% but severe aortic stenosis) and multiple others, known to Dr. Rhea Belton, who was referred to me by Sheliah Hatch, MD for a complaint of Hemoccult positive stool.      11/16/2018 colonoscopy for personal history of adenomatous polyps with 2 to 3-4 mm polyps in the descending and transverse colon, diverticulosis in the sigmoid and descending colon internal hemorrhoids.    11/26/2021 echo with LVEF 60-65%, moderate to severe aortic valve stenosis, aortic valve area by VTI measures 1.05 cm, aortic valve mean gradient measures 31 point 0 aortic valve V-max 3.31.    05/28/2022 CBC with a hemoglobin of 10.7 (12.2 on 12/10/2021).  Hepatic function panel normal.    07/01/2022 fecal occult blood positive.    08/02/2022 patient seen in clinic by me and discussed that he had anemia with Hemoccult positive stools.  He had always been anemic for years.  Noticed a weight loss.  At that time recommended an EGD and colonoscopy at the hospital given severe aortic stenosis.    09/16/2022 EGD with normal esophagus, gastritis and 2 nonbleeding angioectasias in the duodenum treated with APC as well as nonbleeding duodenal diverticulum.    09/16/2022 colonoscopy with five 7-9 mm polyps in the descending, transverse, ascending colon with mild diverticulosis in the left colon as well as internal hemorrhoids.    05/10/2023 Hemoccult positive.    07/20/2023 CBC with a hemoglobin of 12.7 (11.8 on 04/29/2023).    Today, patient presents to clinic accompanied by his daughter.  She tells me they are here because the last day of June they were on a cruise and found a lot of bright red blood in the toilet and kind of dripping down his leg, after that they took him to primary care who ended up doing labs and saw dip in hemoglobin,  he has been on iron 325 mg since and has had an increase in hemoglobin over the past 3 months.  No further bleeding.  Just want to make sure everything is okay.    Nuys fever, chills, weight loss, change in bowel habits, abdominal pain, nausea, vomiting, heartburn or reflux.  Past Medical History:  Diagnosis Date   Acute respiratory failure due to COVID-19 [facility]) 07/07/2020   ANEMIA 02/18/2010   Qualifier: Diagnosis of  By: Beverely Low MD, Natalia Leatherwood     Arthritis 12/2005   left hand   Arthritis    Left knee   ASPARTATE AMINOTRANSFERASE, SERUM, ELEVATED 11/04/2008   Qualifier: Diagnosis of  By: Beverely Low MD, Katherine     Atrial fibrillation (HCC) 07/24/2013   Blood transfusion without reported diagnosis    Bursitis of knee    right knee   CAD (coronary artery disease)    Cardiac arrhythmia due to congenital heart disease    Chronic anticoagulation 06/12/2015   Coagulation defect (HCC) 05/28/2020   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   CORONARY ARTERY DISEASE 03/27/2007   Qualifier: Diagnosis of  By: Blossom Hoops MD, Luis     Diabetes mellitus    Diverticulosis    Glaucoma    left eye   Hx of colonic polyps 06/12/2015   Hyperlipidemia    Hypertension    Hypothyroid 09/21/2018   Long term current use of anticoagulant therapy 07/24/2013   Neuropathy  with pain   Pain due to onychomycosis of toenail of left foot 05/28/2020   Permanent atrial fibrillation (HCC)    RBBB    Scrotal mass 03/07/2012   Spinal stenosis 01/20/2005   Tubular adenoma of colon     Past Surgical History:  Procedure Laterality Date   BACK SURGERY     BIOPSY  09/16/2022   Procedure: BIOPSY;  Surgeon: Meryl Dare, MD;  Location: [facility];  Service: Gastroenterology;;   CATARACT EXTRACTION  08/11/2009   right   COLONOSCOPY WITH PROPOFOL N/A 09/16/2022   Procedure: COLONOSCOPY WITH PROPOFOL;  Surgeon: Meryl Dare, MD;  Location: [facility];  Service:  Gastroenterology;  Laterality: N/A;   corneal endothelial Left 2013   CORNEAL TRANSPLANT Left    eye   CORONARY ANGIOPLASTY WITH STENT PLACEMENT     ESOPHAGOGASTRODUODENOSCOPY (EGD) WITH PROPOFOL N/A 09/16/2022   Procedure: ESOPHAGOGASTRODUODENOSCOPY (EGD) WITH PROPOFOL;  Surgeon: Meryl Dare, MD;  Location: [facility];  Service: Gastroenterology;  Laterality: N/A;   EYE SURGERY     HOT HEMOSTASIS N/A 09/16/2022   Procedure: HOT HEMOSTASIS (ARGON PLASMA COAGULATION/BICAP);  Surgeon: Meryl Dare, MD;  Location: [facility];  Service: Gastroenterology;  Laterality: N/A;   POLYPECTOMY  09/16/2022   Procedure: POLYPECTOMY;  Surgeon: Meryl Dare, MD;  Location: [facility];  Service: Gastroenterology;;   ROTATOR CUFF REPAIR     SMALL INTESTINE SURGERY     SPINE SURGERY      Current Outpatient Medications  Medication Sig Dispense Refill   amLODipine (NORVASC) 2.5 MG tablet Take 1 tablet by mouth once daily 90 tablet 2   atorvastatin (LIPITOR) 40 MG tablet Take 1 tablet (40 mg total) by mouth at bedtime. 90 tablet 0   Calcium Carbonate-Vitamin D (CALCIUM 600 + D PO) Take 1 tablet by mouth 2 (two) times daily.     cetirizine (ZYRTEC) 10 MG tablet Take 1 tablet (10 mg total) by mouth daily. 30 tablet 11   diclofenac Sodium (VOLTAREN) 1 % GEL Apply 2 g topically 4 (four) times daily. 100 g 1   ferrous sulfate 325 (65 FE) MG tablet Take 325 mg by mouth daily.     fluorometholone (FML) 0.1 % ophthalmic suspension Place 1 drop into both eyes every 30 (thirty) days.      furosemide (LASIX) 40 MG tablet Take 1 tablet (40 mg total) by mouth daily. 90 tablet 1   glucose blood (ONETOUCH VERIO) test strip USE 1 STRIP TO CHECK GLUCOSE TWICE DAILY AS DIRECTED 100 each 0   Lancets (ONETOUCH ULTRASOFT) lancets Use as instructed 100 each 12   levothyroxine (SYNTHROID) 75 MCG tablet Take 1 tablet by mouth once daily 90 tablet 0   magnesium oxide (MAGOX 400) 400 (241.3 Mg) MG tablet Take 1  tablet (400 mg total) by mouth 2 (two) times daily. 60 tablet 11   metFORMIN (GLUCOPHAGE) 1000 MG tablet Take 1 tablet (1,000 mg total) by mouth 2 (two) times daily. 180 tablet 0   metoprolol tartrate (LOPRESSOR) 100 MG tablet Take 1 tablet (100 mg total) by mouth 2 (two) times daily. 180 tablet 0   Multiple Vitamin (MULTIVITAMIN) capsule Take 1 capsule by mouth daily.     Omega-3 Fatty Acids (FISH OIL) 1200 MG CAPS Take 3 capsules by mouth daily.     omeprazole (PRILOSEC) 40 MG capsule Take 1 capsule (40 mg total) by mouth 2 (two) times daily for 14 days. 28 capsule 0   tamsulosin (FLOMAX) 0.4  MG CAPS capsule Take 1 capsule by mouth once daily 30 capsule 0   timolol (TIMOPTIC) 0.5 % ophthalmic solution Place 1 drop into both eyes daily.      warfarin (COUMADIN) 2.5 MG tablet TAKE 1/2 TO 1 (HALF TO ONE) TABLET  BY MOUTH ONCE DAILY OR AS DIRECTED BY ANTICOAGULATION CLINIC. 100 tablet 1   No current facility-administered medications for this visit.    Allergies as of 07/21/2023 - Review Complete 07/20/2023  Allergen Reaction Noted   Neomycin-bacitracin zn-polymyx Rash    Penicillins Other (See Comments) and Rash 05/21/2020   Enalapril  11/27/2020   Miconazole nitrate  06/28/2022   Sulfa antibiotics  12/10/2020    Family History  Problem Relation Age of Onset   Heart failure Mother        Heart attack age 67   Heart failure Father        Heart attack age 70   Stomach cancer Neg Hx    Colon cancer Neg Hx    Pancreatic cancer Neg Hx    Throat cancer Neg Hx     Social History   Socioeconomic History   Marital status: Divorced    Spouse name: Not on file   Number of children: 5   Years of education: Not on file   Highest education level: 12th grade  Occupational History   Occupation: retired   Occupation: retired   Occupation: Retired from [employer]  Tobacco Use   Smoking status: Never   Smokeless tobacco: Never  Vaping Use   Vaping status: Never Used  Substance and Sexual  Activity   Alcohol use: Not Currently    Comment: Very seldom---socially   Drug use: No   Sexual activity: Not Currently    Partners: Female  Other Topics Concern   Not on file  Social History Narrative   Volunteers at [employer]         Social Determinants of Health   Financial Resource Strain: Low Risk  (05/11/2023)   Overall Financial Resource Strain (CARDIA)    Difficulty of Paying Living Expenses: Not hard at all  Food Insecurity: No Food Insecurity (05/11/2023)   Hunger Vital Sign    Worried About Running Out of Food in the Last Year: Never true    Ran Out of Food in the Last Year: Never true  Transportation Needs: No Transportation Needs (05/11/2023)   PRAPARE - Administrator, Civil Service (Medical): No    Lack of Transportation (Non-Medical): No  Physical Activity: Inactive (05/11/2023)   Exercise Vital Sign    Days of Exercise per Week: 0 days    Minutes of Exercise per Session: 0 min  Stress: No Stress Concern Present (05/11/2023)   [employer] of Occupational Health - Occupational Stress Questionnaire    Feeling of Stress : Not at all  Social Connections: Moderately Isolated (05/11/2023)   Social Connection and Isolation Panel [NHANES]    Frequency of Communication with Friends and Family: More than three times a week    Frequency of Social Gatherings with Friends and Family: Once a week    Attends Religious Services: Never    Database administrator or Organizations: Yes    Attends Engineer, structural: More than 4 times per year    Marital Status: Divorced  Intimate Partner Violence: Not At Risk (05/11/2023)   Humiliation, Afraid, Rape, and Kick questionnaire    Fear of Current or Ex-Partner: No    Emotionally Abused: No  Physically Abused: No    Sexually Abused: No    Review of Systems:    Constitutional: No weight loss, fever or chills Cardiovascular: No chest pain  Respiratory: No SOB Gastrointestinal: See HPI and  otherwise negative   Physical Exam:  Vital signs: BP 104/60 (BP Location: Left Arm, Patient Position: Sitting, Cuff Size: Normal)   Pulse 75   Ht 5\' 7"  (1.702 m) Comment: height measured without shoes  Wt 208 lb 4 oz (94.5 kg)   BMI 32.62 kg/m    Constitutional:   Pleasant elderly Caucasian male appears to be in NAD, Well developed, Well nourished, alert and cooperative Respiratory: Respirations even and unlabored. Lungs clear to auscultation bilaterally.   No wheezes, crackles, or rhonchi.  Cardiovascular: Normal S1, S2. No MRG. Regular rate and rhythm. No peripheral edema, cyanosis or pallor.  Gastrointestinal:  Soft, nondistended, nontender. No rebound or guarding. Normal bowel sounds. No appreciable masses or hepatomegaly. Rectal:  Not performed.  Psychiatric: Demonstrates good judgement and reason without abnormal affect or behaviors.  RELEVANT LABS AND IMAGING: CBC    Component Value Date/Time   WBC 8.7 07/20/2023 1409   RBC 4.20 (L) 07/20/2023 1409   HGB 12.7 (L) 07/20/2023 1409   HGB 12.7 (L) 11/13/2020 1108   HCT 39.2 07/20/2023 1409   HCT 38.8 11/13/2020 1108   PLT 184.0 07/20/2023 1409   PLT 139 (L) 11/13/2020 1108   MCV 93.3 07/20/2023 1409   MCV 93 11/13/2020 1108   MCH 30.7 04/29/2023 1403   MCHC 32.5 07/20/2023 1409   RDW 14.9 07/20/2023 1409   RDW 13.1 11/13/2020 1108   LYMPHSABS 2.3 07/20/2023 1409   MONOABS 0.7 07/20/2023 1409   EOSABS 0.4 07/20/2023 1409   BASOSABS 0.1 07/20/2023 1409    CMP     Component Value Date/Time   NA 137 06/03/2023 1348   NA 142 11/14/2020 1233   K 4.1 06/03/2023 1348   CL 98 06/03/2023 1348   CO2 28 06/03/2023 1348   GLUCOSE 240 (H) 06/03/2023 1348   BUN 17 06/03/2023 1348   BUN 16 11/14/2020 1233   CREATININE 1.04 06/03/2023 1348   CREATININE 1.26 (H) 04/29/2023 1403   CALCIUM 8.8 06/03/2023 1348   PROT 7.0 05/25/2023 1156   ALBUMIN 4.0 05/25/2023 1156   AST 22 05/25/2023 1156   ALT 19 05/25/2023 1156   ALKPHOS  55 05/25/2023 1156   BILITOT 0.8 05/25/2023 1156   GFRNONAA 73 11/14/2020 1233   GFRAA 85 11/14/2020 1233    Assessment: 1.  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  Rectal bleeding: See above 3.  Severe aortic stenosis 4.  Chronic anticoagulation for CAD: On Coumadin  Plan: 1.  Discussed that there is no need for repeat EGD and colonoscopy at this time as they were recently done.  Patient has had no further acute signs of bleeding that anyone has seen and denies any GI complaints or concerns today.  His hemoglobin has responded well to being on iron daily.  I would recommend he continue this. 2.  PCP should continue to monitor hemoglobin every 2  to 3 months to ensure it is stable. 3.  Told patient and his family to call us if he has increased episodes of bleeding or they are concerned. 4.  Patient to follow in clinic with [place] as needed.  Hyacinth Meeker, PA-C [facility] 07/21/2023, 10:01 AM  Cc: Sheliah Hatch, MD

## 2023-07-21 NOTE — Patient Instructions (Signed)
Continue iron.  _______________________________________________________  If your blood pressure at your visit was 140/90 or greater, please contact your primary care physician to follow up on this.  _______________________________________________________  If you are age 87 or older, your body mass index should be between 23-30. Your Body mass index is 32.62 kg/m. If this is out of the aforementioned range listed, please consider follow up with your Primary Care Provider.  If you are age 76 or younger, your body mass index should be between 19-25. Your Body mass index is 32.62 kg/m. If this is out of the aformentioned range listed, please consider follow up with your Primary Care Provider.   ________________________________________________________  The [facility] providers would like to encourage you to use [facility] to communicate with providers for non-urgent requests or questions.  Due to long hold times on the telephone, sending your provider a message by [facility] may be a faster and more efficient way to get a response.  Please allow 48 business hours for a response.  Please remember that this is for non-urgent requests.  _______________________________________________________   It was a pleasure to see you today!  Thank you for trusting me with your gastrointestinal care!    Hyacinth Meeker, PA-C

## 2023-07-22 ENCOUNTER — Telehealth: Payer: Self-pay

## 2023-07-22 NOTE — Telephone Encounter (Signed)
-----   Message from Neena Rhymes sent at 07/22/2023  3:08 PM EDT ----- Labs are stable and look good!  No changes at this time

## 2023-07-22 NOTE — Telephone Encounter (Signed)
Spoke to patient, he is aware of his lab results

## 2023-07-26 NOTE — Assessment & Plan Note (Signed)
Chronic problem.  Hx of good control.  UTD on eye exam, foot exam.  Check labs.  Adjust meds prn

## 2023-07-26 NOTE — Assessment & Plan Note (Signed)
Pt's PE unchanged from previous.  UTD on eye exam, Tdap, PNA.  Flu shot given today.  No longer doing colonoscopy due to age.  Check labs.  Anticipatory guidance provided.

## 2023-07-27 ENCOUNTER — Other Ambulatory Visit: Payer: Medicare HMO

## 2023-08-02 ENCOUNTER — Other Ambulatory Visit: Payer: Self-pay | Admitting: Family Medicine

## 2023-08-03 ENCOUNTER — Other Ambulatory Visit: Payer: Self-pay

## 2023-08-08 ENCOUNTER — Other Ambulatory Visit: Payer: Self-pay | Admitting: Family Medicine

## 2023-08-16 ENCOUNTER — Other Ambulatory Visit: Payer: Medicare HMO

## 2023-08-16 ENCOUNTER — Ambulatory Visit: Payer: Medicare HMO | Attending: Cardiovascular Disease | Admitting: *Deleted

## 2023-08-16 DIAGNOSIS — Z5181 Encounter for therapeutic drug level monitoring: Secondary | ICD-10-CM

## 2023-08-16 DIAGNOSIS — I4891 Unspecified atrial fibrillation: Secondary | ICD-10-CM | POA: Diagnosis not present

## 2023-08-16 LAB — POCT INR: INR: 2.4 (ref 2.0–3.0)

## 2023-08-16 NOTE — Patient Instructions (Signed)
Description   *DOES NOT NEED A PRINTOUT*  Continue taking warfarin 1 tablet daily except for 1/2 a tablet on Tuesdays and Saturdays.  Stay consistent with greens each week (3 per week). Repeat INR in 4 weeks. Call us with any medication changes or concerns # [phone] [facility]

## 2023-08-17 ENCOUNTER — Encounter (INDEPENDENT_AMBULATORY_CARE_PROVIDER_SITE_OTHER): Payer: Medicare HMO | Admitting: Ophthalmology

## 2023-08-17 DIAGNOSIS — H353221 Exudative age-related macular degeneration, left eye, with active choroidal neovascularization: Secondary | ICD-10-CM | POA: Diagnosis not present

## 2023-08-17 DIAGNOSIS — I10 Essential (primary) hypertension: Secondary | ICD-10-CM

## 2023-08-17 DIAGNOSIS — H43813 Vitreous degeneration, bilateral: Secondary | ICD-10-CM | POA: Diagnosis not present

## 2023-08-17 DIAGNOSIS — H353112 Nonexudative age-related macular degeneration, right eye, intermediate dry stage: Secondary | ICD-10-CM | POA: Diagnosis not present

## 2023-08-17 DIAGNOSIS — H35033 Hypertensive retinopathy, bilateral: Secondary | ICD-10-CM

## 2023-09-06 ENCOUNTER — Other Ambulatory Visit: Payer: Self-pay | Admitting: Family Medicine

## 2023-09-07 ENCOUNTER — Ambulatory Visit: Payer: Medicare HMO

## 2023-09-07 DIAGNOSIS — E119 Type 2 diabetes mellitus without complications: Secondary | ICD-10-CM

## 2023-09-07 DIAGNOSIS — M7751 Other enthesopathy of right foot: Secondary | ICD-10-CM

## 2023-09-07 DIAGNOSIS — I878 Other specified disorders of veins: Secondary | ICD-10-CM

## 2023-09-07 NOTE — Progress Notes (Signed)
Patient presents to the office today for diabetic shoe and insole measuring.  Patient was measured with brannock device to determine size and width for 1 pair of extra depth shoes and foam casted for 3 pair of insoles.   Documentation of medical necessity will be sent to patient's treating diabetic doctor to verify and sign.   Patient's diabetic provider: Neena Rhymes MD   Shoes and insoles will be ordered at that time and patient will be notified for an appointment for fitting when they arrive.   Shoe size (per patient): 8 Brannock measurement: 7 Patient shoe selection- Shoe choice:   [id number] / [id number] Shoe size ordered: 86E Marvin Foster Cped, CFo, CFm  Financials signed

## 2023-09-12 ENCOUNTER — Telehealth: Payer: Self-pay | Admitting: Family Medicine

## 2023-09-12 NOTE — Telephone Encounter (Signed)
Who is calling: [facility] (fax)  Where are they calling from: Sent a fax  Callback number: [employer] - [phone]  What type of DME (Durable Medical Equipment) would you like your provider to order:  Therapeutic Footwear  Who and where does the order need to be sent:  Needs provider signature and faxed to [phone]  Comments:    Last visit with PCP (>3 months requires an appointment for insurance to cover the cost.  Please schedule patient for visit to discuss medical necessity for DME)  Last OV was 07/20/23

## 2023-09-13 ENCOUNTER — Other Ambulatory Visit: Payer: Self-pay | Admitting: Family Medicine

## 2023-09-13 NOTE — Telephone Encounter (Signed)
Placed in folder at nurse station

## 2023-09-13 NOTE — Telephone Encounter (Signed)
 Forms completed and returned to [place]

## 2023-09-13 NOTE — Telephone Encounter (Signed)
Faxed

## 2023-09-14 ENCOUNTER — Encounter (INDEPENDENT_AMBULATORY_CARE_PROVIDER_SITE_OTHER): Payer: Medicare HMO | Admitting: Ophthalmology

## 2023-09-14 DIAGNOSIS — I10 Essential (primary) hypertension: Secondary | ICD-10-CM | POA: Diagnosis not present

## 2023-09-14 DIAGNOSIS — H353112 Nonexudative age-related macular degeneration, right eye, intermediate dry stage: Secondary | ICD-10-CM | POA: Diagnosis not present

## 2023-09-14 DIAGNOSIS — H35033 Hypertensive retinopathy, bilateral: Secondary | ICD-10-CM | POA: Diagnosis not present

## 2023-09-14 DIAGNOSIS — H353221 Exudative age-related macular degeneration, left eye, with active choroidal neovascularization: Secondary | ICD-10-CM | POA: Diagnosis not present

## 2023-09-14 DIAGNOSIS — H43813 Vitreous degeneration, bilateral: Secondary | ICD-10-CM | POA: Diagnosis not present

## 2023-09-15 NOTE — Telephone Encounter (Signed)
Received fax from [facility]. Labeled & placed in provider bin. Believe this is a duplicate order

## 2023-09-15 NOTE — Telephone Encounter (Signed)
FAXED

## 2023-09-15 NOTE — Telephone Encounter (Signed)
 Forms completed and returned to [place]

## 2023-09-15 NOTE — Telephone Encounter (Signed)
Placed in folder . This looks like a new order.

## 2023-09-18 ENCOUNTER — Other Ambulatory Visit: Payer: Self-pay | Admitting: Family Medicine

## 2023-09-20 ENCOUNTER — Ambulatory Visit: Payer: Medicare HMO | Attending: Cardiovascular Disease | Admitting: *Deleted

## 2023-09-20 DIAGNOSIS — Z5181 Encounter for therapeutic drug level monitoring: Secondary | ICD-10-CM

## 2023-09-20 DIAGNOSIS — I4891 Unspecified atrial fibrillation: Secondary | ICD-10-CM | POA: Diagnosis not present

## 2023-09-20 LAB — POCT INR: INR: 2.2 (ref 2.0–3.0)

## 2023-09-20 NOTE — Patient Instructions (Signed)
Description   *DOES NOT NEED A PRINTOUT*  Continue taking warfarin 1 tablet daily except for 1/2 a tablet on Tuesdays and Saturdays.  Stay consistent with greens each week (3 per week). Repeat INR in 6 weeks. Call us with any medication changes or concerns # [phone] [facility]

## 2023-09-26 ENCOUNTER — Other Ambulatory Visit: Payer: Self-pay | Admitting: Family Medicine

## 2023-09-27 ENCOUNTER — Other Ambulatory Visit: Payer: Self-pay | Admitting: Family Medicine

## 2023-09-30 ENCOUNTER — Telehealth: Payer: Self-pay

## 2023-09-30 NOTE — Telephone Encounter (Signed)
Called pt to schedule diabetid shoe pick up left vm

## 2023-10-02 ENCOUNTER — Other Ambulatory Visit: Payer: Self-pay | Admitting: Family Medicine

## 2023-10-11 ENCOUNTER — Other Ambulatory Visit: Payer: Self-pay | Admitting: Family Medicine

## 2023-10-11 DIAGNOSIS — Z947 Corneal transplant status: Secondary | ICD-10-CM | POA: Diagnosis not present

## 2023-10-11 DIAGNOSIS — H353221 Exudative age-related macular degeneration, left eye, with active choroidal neovascularization: Secondary | ICD-10-CM | POA: Diagnosis not present

## 2023-10-11 DIAGNOSIS — H401123 Primary open-angle glaucoma, left eye, severe stage: Secondary | ICD-10-CM | POA: Diagnosis not present

## 2023-10-11 DIAGNOSIS — H18513 Endothelial corneal dystrophy, bilateral: Secondary | ICD-10-CM | POA: Diagnosis not present

## 2023-10-11 DIAGNOSIS — H35371 Puckering of macula, right eye: Secondary | ICD-10-CM | POA: Diagnosis not present

## 2023-10-11 DIAGNOSIS — H401112 Primary open-angle glaucoma, right eye, moderate stage: Secondary | ICD-10-CM | POA: Diagnosis not present

## 2023-10-11 LAB — HM DIABETES EYE EXAM

## 2023-10-14 ENCOUNTER — Ambulatory Visit (INDEPENDENT_AMBULATORY_CARE_PROVIDER_SITE_OTHER): Payer: Medicare HMO | Admitting: Family Medicine

## 2023-10-14 ENCOUNTER — Encounter (INDEPENDENT_AMBULATORY_CARE_PROVIDER_SITE_OTHER): Payer: Medicare HMO | Admitting: Ophthalmology

## 2023-10-14 ENCOUNTER — Encounter: Payer: Self-pay | Admitting: Family Medicine

## 2023-10-14 VITALS — BP 98/54 | HR 100 | Temp 99.7°F | Ht 67.0 in | Wt 205.4 lb

## 2023-10-14 DIAGNOSIS — H353221 Exudative age-related macular degeneration, left eye, with active choroidal neovascularization: Secondary | ICD-10-CM

## 2023-10-14 DIAGNOSIS — H353112 Nonexudative age-related macular degeneration, right eye, intermediate dry stage: Secondary | ICD-10-CM

## 2023-10-14 DIAGNOSIS — H43813 Vitreous degeneration, bilateral: Secondary | ICD-10-CM

## 2023-10-14 DIAGNOSIS — M109 Gout, unspecified: Secondary | ICD-10-CM | POA: Diagnosis not present

## 2023-10-14 DIAGNOSIS — H35371 Puckering of macula, right eye: Secondary | ICD-10-CM | POA: Diagnosis not present

## 2023-10-14 DIAGNOSIS — H35033 Hypertensive retinopathy, bilateral: Secondary | ICD-10-CM | POA: Diagnosis not present

## 2023-10-14 DIAGNOSIS — I10 Essential (primary) hypertension: Secondary | ICD-10-CM

## 2023-10-14 MED ORDER — PREDNISONE 10 MG PO TABS: ORAL_TABLET | ORAL | 0 refills | Status: DC

## 2023-10-14 MED ORDER — ALLOPURINOL 100 MG PO TABS: 100.0000 mg | ORAL_TABLET | Freq: Every day | ORAL | 6 refills | Status: DC

## 2023-10-14 NOTE — Progress Notes (Signed)
   Subjective:    Patient ID: Marvin Foster, male    DOB: 02-14-1934, 89 y.o.   MRN: [id number]  HPI Pain in toes- 'i have gout in both toes'.  Pt reports sxs have been present in R foot for 'awhile' but the L foot just started this morning.  Has had multiple injections in the R foot per podiatry.  + redness, swollen, painful to touch.  Pt is on blood thinners and can't take NSAIDs.     Review of Systems For ROS see HPI     Objective:   Physical Exam Vitals reviewed.  Constitutional:      General: He is not in acute distress.    Appearance: Normal appearance. He is not ill-appearing.  HENT:     Head: Normocephalic and atraumatic.  Cardiovascular:     Rate and Rhythm: Normal rate. Rhythm irregular.     Pulses: Normal pulses.  Musculoskeletal:        General: Swelling (over MTP joints bilaterally, L>R) and tenderness (over MTP joints bilaterally, L>R) present.  Skin:    Findings: Erythema (over MTP joints bilaterally) present.  Neurological:     Mental Status: He is alert. Mental status is at baseline.  Psychiatric:        Mood and Affect: Mood normal.        Behavior: Behavior normal.        Thought Content: Thought content normal.           Assessment & Plan:  Gout- deteriorated.  Pt w/ flare in each MTP joint.  Due to use of blood thinner will start Prednisone  rather than NSAIDs.  Cautioned pt and daughter this will cause his sugars to go up and to be mindful of his eating.  Will also start Allopurinol  as he is in the midst of a flare and this won't trigger one.  Reviewed supportive care and red flags that should prompt return.  Pt expressed understanding and is in agreement w/ plan.

## 2023-10-14 NOTE — Patient Instructions (Signed)
 Follow up as needed or as scheduled Make sure you are drinking lots of water  Try and follow a low purine diet (see attached) START the Allopurinol  once daily START the Prednisone  as directed- 3 pills at the same time x3 days, then 2 pills at the same time x3 days, then 1 pill daily.  Take w/ food  ICE and elevate your feet for relief of pain and swelling Call with any questions or concerns Stay Safe!  Stay Healthy! Happy New Year!!  Low-Purine Eating Plan A low-purine eating plan involves making food choices to limit your purine intake. Purine is a kind of uric acid. Too much uric acid in your blood can cause certain conditions, such as gout and kidney stones. Eating a low-purine diet may help control these conditions. What are tips for following this plan? Shopping Avoid buying products that contain high-fructose corn syrup. Check for this on food labels. It is commonly found in many processed foods and soft drinks. Be sure to check for it in baked goods such as cookies, canned fruits, and cereals and cereal bars. Avoid buying veal, chicken breast with skin, lamb, and organ meats such as liver. These types of meats tend to have the highest purine content. Choose dairy products. These may lower uric acid levels. Avoid certain types of fish. Not all fish and seafood have high purine content. Examples with high purine content include anchovies, trout, tuna, sardines, and salmon. Avoid buying beverages that contain alcohol, particularly beer and hard liquor. Alcohol can affect the way your body gets rid of uric acid. Meal planning  Learn which foods do or do not affect you. If you find out that a food tends to cause your gout symptoms to flare up, avoid eating that food. You can enjoy foods that do not cause problems. If you have any questions about a food item, talk with your dietitian or health care provider. Reduce the overall amount of meat in your diet. When you do eat meat, choose ones with  lower purine content. Include plenty of fruits and vegetables. Although some vegetables may have a high purine content--such as asparagus, mushrooms, spinach, or cauliflower--it has been shown that these do not contribute to uric acid blood levels as much. Consume at least 1 dairy serving a day. This has been shown to decrease uric acid levels. General information If you drink alcohol: Limit how much you have to: 0-1 drink a day for women who are not pregnant. 0-2 drinks a day for men. Know how much alcohol is in a drink. In the [place] one drink equals one 12 oz bottle of beer (355 mL), one 5 oz glass of wine (148 mL), or one 1 oz glass of hard liquor (44 mL). Drink plenty of water . Try to drink enough to keep your urine pale yellow. Fluids can help remove uric acid from your body. Work with your health care provider and dietitian to develop a plan to achieve or maintain a healthy weight. Losing weight may help reduce uric acid in your blood. What foods are recommended? The following are some types of foods that are good choices when limiting purine intake: Fresh or frozen fruits and vegetables. Whole grains, breads, cereals, and pasta. Rice. Beans, peas, legumes. Nuts and seeds. Dairy products. Fats and oils. The items listed above may not be a complete list. Talk with a dietitian about what dietary choices are best for you. What foods are not recommended? Limit your intake of foods high in purines,  including: Beer and other alcohol. Meat-based gravy or sauce. Canned or fresh fish, such as: Anchovies, sardines, herring, salmon, and tuna. Mussels and scallops. Codfish, trout, and haddock. Bacon, veal, chicken breast with skin, and lamb. Organ meats, such as: Liver or kidney. Tripe. Sweetbreads (thymus gland or pancreas). Wild education officer, environmental. Yeast or yeast extract supplements. Drinks sweetened with high-fructose corn syrup, such as soda. Processed foods made with high-fructose corn  syrup. The items listed above may not be a complete list of foods and beverages you should limit. Contact a dietitian for more information. Summary Eating a low-purine diet may help control conditions caused by too much uric acid in the body, such as gout or kidney stones. Choose low-purine foods, limit alcohol, and limit high-fructose corn syrup. You will learn over time which foods do or do not affect you. If you find out that a food tends to cause your gout symptoms to flare up, avoid eating that food. This information is not intended to replace advice given to you by your health care provider. Make sure you discuss any questions you have with your health care provider. Document Revised: 09/10/2021 Document Reviewed: 09/10/2021 Elsevier Patient Education  2024 [employer].

## 2023-10-18 ENCOUNTER — Other Ambulatory Visit: Payer: Self-pay | Admitting: Family Medicine

## 2023-10-18 DIAGNOSIS — E1169 Type 2 diabetes mellitus with other specified complication: Secondary | ICD-10-CM

## 2023-10-30 ENCOUNTER — Other Ambulatory Visit: Payer: Self-pay | Admitting: Cardiology

## 2023-11-01 ENCOUNTER — Ambulatory Visit: Payer: Medicare HMO | Attending: Cardiology

## 2023-11-01 DIAGNOSIS — I4891 Unspecified atrial fibrillation: Secondary | ICD-10-CM | POA: Diagnosis not present

## 2023-11-01 DIAGNOSIS — Z5181 Encounter for therapeutic drug level monitoring: Secondary | ICD-10-CM | POA: Diagnosis not present

## 2023-11-01 LAB — POCT INR: INR: 1.4 — AB (ref 2.0–3.0)

## 2023-11-01 NOTE — Patient Instructions (Signed)
*  DOES NOT NEED A PRINTOUT* Take 1.5 tablets today only then  Continue taking warfarin 1 tablet daily except for 1/2 a tablet on Tuesdays and Saturdays.  Stay consistent with greens each week (3 per week). Repeat INR in 3 weeks. Call us with any medication changes or concerns # [phone] [facility].  Allopurinol Daily started 1/3

## 2023-11-02 ENCOUNTER — Telehealth: Payer: Self-pay

## 2023-11-02 ENCOUNTER — Ambulatory Visit (INDEPENDENT_AMBULATORY_CARE_PROVIDER_SITE_OTHER): Payer: Medicare HMO

## 2023-11-02 DIAGNOSIS — M2011 Hallux valgus (acquired), right foot: Secondary | ICD-10-CM | POA: Diagnosis not present

## 2023-11-02 DIAGNOSIS — M2141 Flat foot [pes planus] (acquired), right foot: Secondary | ICD-10-CM | POA: Diagnosis not present

## 2023-11-02 DIAGNOSIS — M2142 Flat foot [pes planus] (acquired), left foot: Secondary | ICD-10-CM | POA: Diagnosis not present

## 2023-11-02 DIAGNOSIS — M2012 Hallux valgus (acquired), left foot: Secondary | ICD-10-CM | POA: Diagnosis not present

## 2023-11-02 DIAGNOSIS — E119 Type 2 diabetes mellitus without complications: Secondary | ICD-10-CM

## 2023-11-02 NOTE — Telephone Encounter (Signed)
Sent to PCP ?

## 2023-11-02 NOTE — Progress Notes (Signed)

## 2023-11-04 NOTE — Telephone Encounter (Signed)
Rus Pavel (son) is going to pick up today. Placed in file cabinet

## 2023-11-04 NOTE — Telephone Encounter (Signed)
Forms completed and returned to [place]

## 2023-11-05 ENCOUNTER — Other Ambulatory Visit: Payer: Self-pay | Admitting: Family Medicine

## 2023-11-11 ENCOUNTER — Encounter (INDEPENDENT_AMBULATORY_CARE_PROVIDER_SITE_OTHER): Payer: Medicare HMO | Admitting: Ophthalmology

## 2023-11-11 ENCOUNTER — Other Ambulatory Visit: Payer: Self-pay | Admitting: Family Medicine

## 2023-11-11 DIAGNOSIS — I10 Essential (primary) hypertension: Secondary | ICD-10-CM | POA: Diagnosis not present

## 2023-11-11 DIAGNOSIS — H353112 Nonexudative age-related macular degeneration, right eye, intermediate dry stage: Secondary | ICD-10-CM

## 2023-11-11 DIAGNOSIS — H353221 Exudative age-related macular degeneration, left eye, with active choroidal neovascularization: Secondary | ICD-10-CM

## 2023-11-11 DIAGNOSIS — H43813 Vitreous degeneration, bilateral: Secondary | ICD-10-CM | POA: Diagnosis not present

## 2023-11-11 DIAGNOSIS — H35033 Hypertensive retinopathy, bilateral: Secondary | ICD-10-CM | POA: Diagnosis not present

## 2023-11-11 DIAGNOSIS — H35372 Puckering of macula, left eye: Secondary | ICD-10-CM | POA: Diagnosis not present

## 2023-11-15 ENCOUNTER — Encounter: Payer: Self-pay | Admitting: Family Medicine

## 2023-11-15 DIAGNOSIS — E785 Hyperlipidemia, unspecified: Secondary | ICD-10-CM

## 2023-11-16 MED ORDER — CETIRIZINE HCL 10 MG PO TABS: 10.0000 mg | ORAL_TABLET | Freq: Every day | ORAL | 1 refills | Status: DC

## 2023-11-16 MED ORDER — ALLOPURINOL 100 MG PO TABS: 100.0000 mg | ORAL_TABLET | Freq: Every day | ORAL | 1 refills | Status: DC

## 2023-11-16 MED ORDER — TAMSULOSIN HCL 0.4 MG PO CAPS: 0.4000 mg | ORAL_CAPSULE | Freq: Every day | ORAL | 1 refills | Status: DC

## 2023-11-16 NOTE — Telephone Encounter (Signed)
 Printed copy back out and placed in your sign folder

## 2023-11-16 NOTE — Telephone Encounter (Signed)
 Patient Daughter is requesting 90 day supplies of meds in order to lessen the frequency of trips to the pharmacy, I have pended them below as 90 day rx please ensure this is appropriate.  Looks like only 3 are not 90 day supplies at this time

## 2023-11-22 ENCOUNTER — Ambulatory Visit: Payer: Medicare HMO | Attending: Cardiovascular Disease | Admitting: *Deleted

## 2023-11-22 DIAGNOSIS — I4891 Unspecified atrial fibrillation: Secondary | ICD-10-CM | POA: Diagnosis not present

## 2023-11-22 DIAGNOSIS — Z5181 Encounter for therapeutic drug level monitoring: Secondary | ICD-10-CM

## 2023-11-22 LAB — POCT INR: INR: 1.7 — AB (ref 2.0–3.0)

## 2023-11-22 NOTE — Patient Instructions (Signed)
Description   *DOES NOT NEED A PRINTOUT* Take 1 tablet today then START taking warfarin 1 tablet daily except for 1/2 tablet on  Saturdays.  Stay consistent with greens each week (3 per week). Repeat INR in 3 weeks. Call us with any medication changes or concerns # [phone] [facility].  Allopurinol Daily started 1/3

## 2023-11-30 ENCOUNTER — Other Ambulatory Visit: Payer: Self-pay | Admitting: Family Medicine

## 2023-11-30 ENCOUNTER — Encounter: Payer: Self-pay | Admitting: Family Medicine

## 2023-12-02 ENCOUNTER — Encounter: Payer: Self-pay | Admitting: Family Medicine

## 2023-12-02 ENCOUNTER — Ambulatory Visit (INDEPENDENT_AMBULATORY_CARE_PROVIDER_SITE_OTHER): Payer: Medicare HMO | Admitting: Family Medicine

## 2023-12-02 VITALS — BP 110/64 | HR 66 | Temp 99.0°F | Ht 67.0 in | Wt 210.0 lb

## 2023-12-02 DIAGNOSIS — G8929 Other chronic pain: Secondary | ICD-10-CM

## 2023-12-02 DIAGNOSIS — M25562 Pain in left knee: Secondary | ICD-10-CM

## 2023-12-02 DIAGNOSIS — M79674 Pain in right toe(s): Secondary | ICD-10-CM

## 2023-12-02 MED ORDER — DICLOFENAC SODIUM 1 % EX GEL: 2.0000 g | Freq: Four times a day (QID) | CUTANEOUS | 1 refills | Status: AC

## 2023-12-02 NOTE — Patient Instructions (Signed)
Follow up as needed or as scheduled APPLY the Diclofenac (Voltaren) gel to the foot as needed for pain and swelling We'll call you to schedule your Podiatry appt I don't think this is gout- I think it's more the deformed joint Call and schedule w/ Ortho for the knee Call with any questions or concerns Stay Safe!  Stay Healthy!

## 2023-12-02 NOTE — Progress Notes (Signed)
   Subjective:    Patient ID: Marvin Foster, male    DOB: 16-Jul-1934, 89 y.o.   MRN: [id number]  HPI R great toe pain- daughter reports he has had pain in R great toe since June.  Currently on Allopurinol.  Pt denies redness or warmth of R great toe.  Toe has known deformity.  He states that once he gets his socks and shoes on, toe no longer hurts.  L knee pain- ongoing.  Did [employer] w/o relief as a last ditch effort.  Has had gel shots, cortisone shots.  Has been to Ortho in the past.   Review of Systems For ROS see HPI     Objective:   Physical Exam Vitals reviewed.  Constitutional:      General: He is not in acute distress.    Appearance: Normal appearance. He is not ill-appearing.  HENT:     Head: Normocephalic and atraumatic.  Musculoskeletal:        General: Deformity (R great toe angles laterally and crosses under 2nd toe w/ prominent bunion) present.  Skin:    General: Skin is warm and dry.     Findings: No erythema.  Neurological:     Mental Status: He is alert. Mental status is at baseline.           Assessment & Plan:  Pain of R great toe- new.  Pt w/ known deformity and hx of gout.  This is not consistent w/ his previous gout episodes and suspect that this is more joint pain than gout pain.  Area is not red or warm or swollen like it has been in the past.  Will refer to Podiatry for complete evaluation an pt to apply Voltaren gel PRN.  Pt expressed understanding and is in agreement w/ plan.   L chronic knee pain- new to provider, ongoing for pt.  Encouraged them to f/u w/ Ortho.  Pt expressed understanding and is in agreement w/ plan.

## 2023-12-07 ENCOUNTER — Ambulatory Visit: Payer: Medicare HMO | Admitting: Podiatry

## 2023-12-07 DIAGNOSIS — M7751 Other enthesopathy of right foot: Secondary | ICD-10-CM | POA: Diagnosis not present

## 2023-12-07 NOTE — Progress Notes (Unsigned)
 Subjective:  Patient ID: Marvin Foster, male    DOB: 29-May-1934,  MRN: [id number]  Chief Complaint  Patient presents with   Foot Pain    89 y.o. male presents with the above complaint.  Patient presents with follow-up of right first metatarsophalangeal joint gout flare capsulitis.  Patient states is doing better but he still has some pain in the big toe joint.  The injection helped considerably.  Denies any other acute issues.  He wanted to discuss diet options again.   Review of Systems: Negative except as noted in the HPI. Denies N/V/F/Ch.  Past Medical History:  Diagnosis Date   Acute respiratory failure due to COVID-19 [facility]) 07/07/2020   ANEMIA 02/18/2010   Qualifier: Diagnosis of  By: Beverely Low MD, Natalia Leatherwood     Arthritis 12/2005   left hand   Arthritis    Left knee   ASPARTATE AMINOTRANSFERASE, SERUM, ELEVATED 11/04/2008   Qualifier: Diagnosis of  By: Beverely Low MD, Katherine     Atrial fibrillation (HCC) 07/24/2013   Blood transfusion without reported diagnosis    Bursitis of knee    right knee   CAD (coronary artery disease)    Cardiac arrhythmia due to congenital heart disease    Chronic anticoagulation 06/12/2015   Coagulation defect (HCC) 05/28/2020   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   CORONARY ARTERY DISEASE 03/27/2007   Qualifier: Diagnosis of  By: Blossom Hoops MD, Luis     Diabetes mellitus    Diverticulosis    Glaucoma    left eye   Hx of colonic polyps 06/12/2015   Hyperlipidemia    Hypertension    Hypothyroid 09/21/2018   Long term current use of anticoagulant therapy 07/24/2013   Neuropathy    with pain   Pain due to onychomycosis of toenail of left foot 05/28/2020   Permanent atrial fibrillation (HCC)    RBBB    Scrotal mass 03/07/2012   Spinal stenosis 01/20/2005   Tubular adenoma of colon     Current Outpatient Medications:    allopurinol (ZYLOPRIM) 100 MG tablet, Take 1 tablet (100 mg total) by mouth daily.,  Disp: 90 tablet, Rfl: 1   amLODipine (NORVASC) 2.5 MG tablet, Take 1 tablet by mouth once daily, Disp: 90 tablet, Rfl: 2   atorvastatin (LIPITOR) 40 MG tablet, TAKE 1 TABLET BY MOUTH AT BEDTIME, Disp: 90 tablet, Rfl: 0   Calcium Carbonate-Vitamin D (CALCIUM 600 + D PO), Take 1 tablet by mouth 2 (two) times daily., Disp: , Rfl:    cetirizine (ZYRTEC) 10 MG tablet, Take 1 tablet (10 mg total) by mouth daily., Disp: 90 tablet, Rfl: 1   diclofenac Sodium (VOLTAREN) 1 % GEL, Apply 2 g topically 4 (four) times daily., Disp: 100 g, Rfl: 1   ferrous sulfate 325 (65 FE) MG tablet, Take 325 mg by mouth daily., Disp: , Rfl:    fluorometholone (FML) 0.1 % ophthalmic suspension, Place 1 drop into both eyes every 30 (thirty) days. , Disp: , Rfl:    furosemide (LASIX) 40 MG tablet, Take 1 tablet by mouth once daily, Disp: 90 tablet, Rfl: 0   glucose blood (ONETOUCH VERIO) test strip, USE 1 STRIP TO CHECK GLUCOSE TWICE DAILY AS DIRECTED, Disp: 100 each, Rfl: 0   Lancets (ONETOUCH ULTRASOFT) lancets, Use as instructed, Disp: 100 each, Rfl: 12   latanoprost (XALATAN) 0.005 % ophthalmic solution, SMARTSIG:In Eye(s), Disp: , Rfl:    levothyroxine (SYNTHROID) 75 MCG tablet, Take 1 tablet by  mouth once daily, Disp: 90 tablet, Rfl: 0   magnesium oxide (MAGOX 400) 400 (241.3 Mg) MG tablet, Take 1 tablet (400 mg total) by mouth 2 (two) times daily., Disp: 60 tablet, Rfl: 11   metFORMIN (GLUCOPHAGE) 1000 MG tablet, Take 1 tablet by mouth twice daily, Disp: 180 tablet, Rfl: 0   metoprolol tartrate (LOPRESSOR) 100 MG tablet, Take 1 tablet by mouth twice daily, Disp: 180 tablet, Rfl: 0   Multiple Vitamin (MULTIVITAMIN) capsule, Take 1 capsule by mouth daily., Disp: , Rfl:    Omega-3 Fatty Acids (FISH OIL) 1200 MG CAPS, Take 3 capsules by mouth daily., Disp: , Rfl:    omeprazole (PRILOSEC) 40 MG capsule, Take 1 capsule (40 mg total) by mouth 2 (two) times daily for 14 days., Disp: 28 capsule, Rfl: 0   predniSONE (DELTASONE) 10  MG tablet, 3 tabs x3 days and then 2 tabs x3 days and then 1 tab x3 days.  Take w/ food., Disp: 18 tablet, Rfl: 0   tamsulosin (FLOMAX) 0.4 MG CAPS capsule, Take 1 capsule (0.4 mg total) by mouth daily., Disp: 90 capsule, Rfl: 1   timolol (TIMOPTIC) 0.5 % ophthalmic solution, Place 1 drop into both eyes daily. , Disp: , Rfl:    warfarin (COUMADIN) 2.5 MG tablet, TAKE 1/2 TO 1 (HALF TO ONE) TABLET  BY MOUTH ONCE DAILY OR AS DIRECTED BY ANTICOAGULATION CLINIC., Disp: 100 tablet, Rfl: 1  Social History   Tobacco Use  Smoking Status Never  Smokeless Tobacco Never    Allergies  Allergen Reactions   Neomycin-Bacitracin Zn-Polymyx Rash   Penicillins Other (See Comments) and Rash    Pt states this was diagnosed years ago, unsure as to reaction. Will not use.    Enalapril     Stopped 11/2020 due to hyperkalemia due to this   Miconazole Nitrate     Unsure of reaction    Sulfa Antibiotics     Pt not aware of allergy    Objective:  There were no vitals filed for this visit. There is no height or weight on file to calculate BMI. Constitutional Well developed. Well nourished.  Vascular Dorsalis pedis pulses palpable bilaterally. Posterior tibial pulses palpable bilaterally. Capillary refill normal to all digits.  No cyanosis or clubbing noted. Pedal hair growth normal.  Neurologic Normal speech. Oriented to person, place, and time. Epicritic sensation to light touch grossly present bilaterally.  Dermatologic Nails well groomed and normal in appearance. No open wounds. No skin lesions.  Orthopedic: Pain on palpation right first metatarsophalangeal joint.  No redness noted.  No erythema noted.  Some swelling still appreciated pain with range of motion of the big toe joint   Radiographs: None Assessment:   No diagnosis found.  Plan:  Patient was evaluated and treated and all questions answered.  Right first MTP capsulitis with underlying gout flare -All questions and concerns were  discussed with the patient extensive detail given the amount of pain that is having a benefit from a steroid injection to help decrease inflammatory component associate with pain.  Patient agrees with following the procedure steroid injection -A steroid injection was performed at right first MTP using 1% plain Lidocaine and 10 mg of Kenalog. This was well tolerated. -I also discussed diet for gout as well in extensive detail to decrease appearing levels in the blood.  He states understanding will work on   No follow-ups on file.

## 2023-12-09 ENCOUNTER — Encounter (INDEPENDENT_AMBULATORY_CARE_PROVIDER_SITE_OTHER): Payer: Medicare HMO | Admitting: Ophthalmology

## 2023-12-09 DIAGNOSIS — H35033 Hypertensive retinopathy, bilateral: Secondary | ICD-10-CM | POA: Diagnosis not present

## 2023-12-09 DIAGNOSIS — H43813 Vitreous degeneration, bilateral: Secondary | ICD-10-CM

## 2023-12-09 DIAGNOSIS — H353112 Nonexudative age-related macular degeneration, right eye, intermediate dry stage: Secondary | ICD-10-CM

## 2023-12-09 DIAGNOSIS — H353221 Exudative age-related macular degeneration, left eye, with active choroidal neovascularization: Secondary | ICD-10-CM

## 2023-12-09 DIAGNOSIS — I10 Essential (primary) hypertension: Secondary | ICD-10-CM

## 2023-12-09 DIAGNOSIS — H401123 Primary open-angle glaucoma, left eye, severe stage: Secondary | ICD-10-CM | POA: Diagnosis not present

## 2023-12-09 DIAGNOSIS — H401112 Primary open-angle glaucoma, right eye, moderate stage: Secondary | ICD-10-CM | POA: Diagnosis not present

## 2023-12-13 ENCOUNTER — Ambulatory Visit: Payer: Medicare HMO | Attending: Cardiovascular Disease

## 2023-12-13 DIAGNOSIS — I4891 Unspecified atrial fibrillation: Secondary | ICD-10-CM | POA: Diagnosis not present

## 2023-12-13 DIAGNOSIS — Z5181 Encounter for therapeutic drug level monitoring: Secondary | ICD-10-CM | POA: Diagnosis not present

## 2023-12-13 LAB — POCT INR: INR: 1.9 — AB (ref 2.0–3.0)

## 2023-12-13 NOTE — Patient Instructions (Signed)
*  DOES NOT NEED A PRINTOUT* Take 1.5 tablets today then continue 1 tablet daily except for 1/2 tablet on  Saturdays.  Stay consistent with greens each week (3 per week). Repeat INR in 3 weeks. Call us with any medication changes or concerns # [phone] [facility].  Allopurinol Daily started 1/3

## 2023-12-17 ENCOUNTER — Other Ambulatory Visit: Payer: Self-pay | Admitting: Family Medicine

## 2023-12-19 ENCOUNTER — Ambulatory Visit (HOSPITAL_COMMUNITY): Payer: Medicare HMO | Attending: Cardiovascular Disease

## 2023-12-19 DIAGNOSIS — I350 Nonrheumatic aortic (valve) stenosis: Secondary | ICD-10-CM | POA: Diagnosis not present

## 2023-12-19 LAB — ECHOCARDIOGRAM COMPLETE
AR max vel: 0.65 cm2
AV Area VTI: 0.62 cm2
AV Area mean vel: 0.63 cm2
AV Mean grad: 27 mmHg
AV Peak grad: 48.2 mmHg
Ao pk vel: 3.47 m/s
MV M vel: 5.42 m/s
MV Peak grad: 117.5 mmHg
S' Lateral: 2.9 cm

## 2023-12-19 MED ORDER — METFORMIN HCL 1000 MG PO TABS
1000.0000 mg | ORAL_TABLET | Freq: Two times a day (BID) | ORAL | 0 refills | Status: DC
Start: 2023-12-19 — End: 2024-03-19

## 2023-12-19 MED ORDER — LEVOTHYROXINE SODIUM 75 MCG PO TABS: 75.0000 ug | ORAL_TABLET | Freq: Every day | ORAL | 0 refills | Status: DC

## 2023-12-23 ENCOUNTER — Encounter: Payer: Self-pay | Admitting: Cardiovascular Disease

## 2023-12-23 ENCOUNTER — Ambulatory Visit: Payer: Medicare HMO | Attending: Cardiology | Admitting: Cardiovascular Disease

## 2023-12-23 VITALS — BP 110/62 | HR 77 | Resp 16 | Ht 67.0 in | Wt 204.0 lb

## 2023-12-23 DIAGNOSIS — I350 Nonrheumatic aortic (valve) stenosis: Secondary | ICD-10-CM | POA: Diagnosis not present

## 2023-12-23 NOTE — Progress Notes (Signed)
 Structural Heart Clinic Note  Chief Complaint  Patient presents with   Follow-up    Severe aortic stenosis   History of Present Illness: 89 yo male with history of persistent atrial fibrillation, CAD, DM, hyperlipidemia, HTN, hypothyroidism and RBBB who is here today for follow up. I saw him as a new consult in the structural heart clinic in February 2024. He has persistent atrial fibrillation and is on coumadin. He has CAD and had an LAD stent placed in 2006. He has been followed for moderate aortic stenosis by Dr. Anne Fu. Echo January 2024 with LVEF=60-65%. Severe aortic stenosis with mean gradient 36.5 mmHg, AVA 0.9 cm2, DI 0.26, SVI 32. He was feeling well when I met him in February 2024 so we elected to follow his aortic stenosis. Repeat echo in September 2024 with LVEF=55-60%. RV function normal. Mild mitral regurgitation. Severe aortic valve stenosis with mean gradient 37 mmHg, AVA 0.5 cm2, DI 0.18, SVI 21. He was feeling well when last seen in our office in September 2024. Echo 12/19/23 with LVEF=60-65%. Mild to moderate MR. Low flow, low gradient severe AS with mean gradient 25 mmHg, AVA 0.58 cm2, DI 0.2, SVI 26.   He is here today for follow up. The patient denies any chest pain, dyspnea, palpitations, lower extremity edema, orthopnea, PND, dizziness, near syncope or syncope. Chronic LE edema unchanged.   He lives in [place] alone. He is retired from [employer]. He has dentures and no active dental issues.   Primary Care Physician: Sheliah Hatch, MD Primary Cardiologist: Anne Fu Referring Cardiologist: Anne Fu  Past Medical History:  Diagnosis Date   Acute respiratory failure due to COVID-19 [facility]) 07/07/2020   ANEMIA 02/18/2010   Qualifier: Diagnosis of  By: Beverely Low MD, Natalia Leatherwood     Arthritis 12/2005   left hand   Arthritis    Left knee   ASPARTATE AMINOTRANSFERASE, SERUM, ELEVATED 11/04/2008   Qualifier: Diagnosis of  By: Beverely Low MD, Katherine     Atrial  fibrillation (HCC) 07/24/2013   Blood transfusion without reported diagnosis    Bursitis of knee    right knee   CAD (coronary artery disease)    Cardiac arrhythmia due to congenital heart disease    Chronic anticoagulation 06/12/2015   Coagulation defect (HCC) 05/28/2020   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   CORONARY ARTERY DISEASE 03/27/2007   Qualifier: Diagnosis of  By: Blossom Hoops MD, Luis     Diabetes mellitus    Diverticulosis    Glaucoma    left eye   Hx of colonic polyps 06/12/2015   Hyperlipidemia    Hypertension    Hypothyroid 09/21/2018   Long term current use of anticoagulant therapy 07/24/2013   Neuropathy    with pain   Pain due to onychomycosis of toenail of left foot 05/28/2020   Permanent atrial fibrillation (HCC)    RBBB    Scrotal mass 03/07/2012   Spinal stenosis 01/20/2005   Tubular adenoma of colon     Past Surgical History:  Procedure Laterality Date   BACK SURGERY     BIOPSY  09/16/2022   Procedure: BIOPSY;  Surgeon: Meryl Dare, MD;  Location: [facility];  Service: Gastroenterology;;   CATARACT EXTRACTION  08/11/2009   right   COLONOSCOPY WITH PROPOFOL N/A 09/16/2022   Procedure: COLONOSCOPY WITH PROPOFOL;  Surgeon: Meryl Dare, MD;  Location: [facility];  Service: Gastroenterology;  Laterality: N/A;   corneal endothelial Left 2013  CORNEAL TRANSPLANT Left    eye   CORONARY ANGIOPLASTY WITH STENT PLACEMENT     ESOPHAGOGASTRODUODENOSCOPY (EGD) WITH PROPOFOL N/A 09/16/2022   Procedure: ESOPHAGOGASTRODUODENOSCOPY (EGD) WITH PROPOFOL;  Surgeon: Meryl Dare, MD;  Location: [facility];  Service: Gastroenterology;  Laterality: N/A;   EYE SURGERY     HOT HEMOSTASIS N/A 09/16/2022   Procedure: HOT HEMOSTASIS (ARGON PLASMA COAGULATION/BICAP);  Surgeon: Meryl Dare, MD;  Location: [facility];  Service: Gastroenterology;  Laterality: N/A;   POLYPECTOMY  09/16/2022   Procedure: POLYPECTOMY;   Surgeon: Meryl Dare, MD;  Location: [facility];  Service: Gastroenterology;;   ROTATOR CUFF REPAIR     SMALL INTESTINE SURGERY     SPINE SURGERY      Current Outpatient Medications  Medication Sig Dispense Refill   allopurinol (ZYLOPRIM) 100 MG tablet Take 1 tablet (100 mg total) by mouth daily. 90 tablet 1   amLODipine (NORVASC) 2.5 MG tablet Take 1 tablet by mouth once daily 90 tablet 2   atorvastatin (LIPITOR) 40 MG tablet TAKE 1 TABLET BY MOUTH AT BEDTIME 90 tablet 0   Calcium Carbonate-Vitamin D (CALCIUM 600 + D PO) Take 1 tablet by mouth 2 (two) times daily.     cetirizine (ZYRTEC) 10 MG tablet Take 1 tablet (10 mg total) by mouth daily. 90 tablet 1   diclofenac Sodium (VOLTAREN) 1 % GEL Apply 2 g topically 4 (four) times daily. 100 g 1   ferrous sulfate 325 (65 FE) MG tablet Take 325 mg by mouth daily.     fluorometholone (FML) 0.1 % ophthalmic suspension Place 1 drop into both eyes every 30 (thirty) days.      furosemide (LASIX) 40 MG tablet Take 1 tablet by mouth once daily 90 tablet 0   glucose blood (ONETOUCH VERIO) test strip USE 1 STRIP TO CHECK GLUCOSE TWICE DAILY AS DIRECTED 100 each 0   Lancets (ONETOUCH ULTRASOFT) lancets Use as instructed 100 each 12   latanoprost (XALATAN) 0.005 % ophthalmic solution SMARTSIG:In Eye(s)     levothyroxine (SYNTHROID) 75 MCG tablet Take 1 tablet (75 mcg total) by mouth daily. 90 tablet 0   magnesium oxide (MAGOX 400) 400 (241.3 Mg) MG tablet Take 1 tablet (400 mg total) by mouth 2 (two) times daily. 60 tablet 11   metFORMIN (GLUCOPHAGE) 1000 MG tablet Take 1 tablet (1,000 mg total) by mouth 2 (two) times daily. 180 tablet 0   metoprolol tartrate (LOPRESSOR) 100 MG tablet Take 1 tablet by mouth twice daily 180 tablet 0   Multiple Vitamin (MULTIVITAMIN) capsule Take 1 capsule by mouth daily.     Omega-3 Fatty Acids (FISH OIL) 1200 MG CAPS Take 3 capsules by mouth daily.     omeprazole (PRILOSEC) 40 MG capsule Take 1 capsule (40 mg  total) by mouth 2 (two) times daily for 14 days. 28 capsule 0   predniSONE (DELTASONE) 10 MG tablet 3 tabs x3 days and then 2 tabs x3 days and then 1 tab x3 days.  Take w/ food. 18 tablet 0   tamsulosin (FLOMAX) 0.4 MG CAPS capsule Take 1 capsule (0.4 mg total) by mouth daily. 90 capsule 1   timolol (TIMOPTIC) 0.5 % ophthalmic solution Place 1 drop into both eyes daily.      warfarin (COUMADIN) 2.5 MG tablet TAKE 1/2 TO 1 (HALF TO ONE) TABLET  BY MOUTH ONCE DAILY OR AS DIRECTED BY ANTICOAGULATION CLINIC. 100 tablet 1   No current facility-administered medications for this visit.  Allergies  Allergen Reactions   Neomycin-Bacitracin Zn-Polymyx Rash   Penicillins Other (See Comments) and Rash    Pt states this was diagnosed years ago, unsure as to reaction. Will not use.    Enalapril     Stopped 11/2020 due to hyperkalemia due to this   Miconazole Nitrate     Unsure of reaction    Sulfa Antibiotics     Pt not aware of allergy     Social History   Socioeconomic History   Marital status: Divorced    Spouse name: Not on file   Number of children: 5   Years of education: Not on file   Highest education level: 12th grade  Occupational History   Occupation: retired   Occupation: retired   Occupation: Retired from [employer]  Tobacco Use   Smoking status: Never   Smokeless tobacco: Never  Vaping Use   Vaping status: Never Used  Substance and Sexual Activity   Alcohol use: Not Currently    Comment: Very seldom---socially   Drug use: No   Sexual activity: Not Currently    Partners: Female  Other Topics Concern   Not on file  Social History Narrative   Volunteers at [employer]         Social Drivers of Health   Financial Resource Strain: Low Risk  (05/11/2023)   Overall Financial Resource Strain (CARDIA)    Difficulty of Paying Living Expenses: Not hard at all  Food Insecurity: No Food Insecurity (05/11/2023)   Hunger Vital Sign    Worried About Running Out of Food in  the Last Year: Never true    Ran Out of Food in the Last Year: Never true  Transportation Needs: No Transportation Needs (05/11/2023)   PRAPARE - Administrator, Civil Service (Medical): No    Lack of Transportation (Non-Medical): No  Physical Activity: Inactive (05/11/2023)   Exercise Vital Sign    Days of Exercise per Week: 0 days    Minutes of Exercise per Session: 0 min  Stress: No Stress Concern Present (05/11/2023)   [employer] of Occupational Health - Occupational Stress Questionnaire    Feeling of Stress : Not at all  Social Connections: Moderately Isolated (05/11/2023)   Social Connection and Isolation Panel [NHANES]    Frequency of Communication with Friends and Family: More than three times a week    Frequency of Social Gatherings with Friends and Family: Once a week    Attends Religious Services: Never    Database administrator or Organizations: Yes    Attends Engineer, structural: More than 4 times per year    Marital Status: Divorced  Intimate Partner Violence: Not At Risk (05/11/2023)   Humiliation, Afraid, Rape, and Kick questionnaire    Fear of Current or Ex-Partner: No    Emotionally Abused: No    Physically Abused: No    Sexually Abused: No    Family History  Problem Relation Age of Onset   Heart failure Mother        Heart attack age 66   Heart failure Father        Heart attack age 14   Stomach cancer Neg Hx    Colon cancer Neg Hx    Pancreatic cancer Neg Hx    Throat cancer Neg Hx     Review of Systems:  As stated in the HPI and otherwise negative.   BP 110/62 (BP Location: Left Arm, Patient Position: Sitting, Cuff Size:  Large)   Pulse 77   Resp 16   Ht 5\' 7"  (1.702 m)   Wt 92.5 kg   SpO2 95%   BMI 31.95 kg/m   Physical Examination:  General: Well developed, well nourished, NAD  HEENT: OP clear, mucus membranes moist  SKIN: warm, dry. No rashes. Neuro: No focal deficits  Musculoskeletal: Muscle strength 5/5 all ext   Psychiatric: Mood and affect normal  Neck: No JVD, no carotid bruits, no thyromegaly, no lymphadenopathy.  Lungs:Clear bilaterally, no wheezes, rhonci, crackles Cardiovascular: Regular rate and rhythm. Systolic murmur.  Abdomen:Soft. Bowel sounds present. Non-tender.  Extremities: 1+ bilateral lower extremity edema. Pulses are 2 + in the bilateral DP/PT.  EKG:  EKG is not ordered today. The ekg ordered today demonstrates  Echo March 2025: 1. Left ventricular ejection fraction, by estimation, is 60 to 65%. The  left ventricle has normal function. There is mild left ventricular  hypertrophy. Left ventricular diastolic parameters are indeterminate.   2. Right ventricular systolic function is low normal. The right  ventricular size is normal.   3. Left atrial size was mildly dilated.   4. Right atrial size was mildly dilated.   5. Mild to moderate mitral valve regurgitation.   6. AV is thickened, calcified with restricted motion. Diffiuclt to  discern individual leaflets. Peak and mean gradients through the valve are  41 and 25 mm Hg respectively. AVA (by VTI) is 0.58 cm2. DVI is 0.2 Overall  consistent with low flow, severe AS.   (SVI is 26). [name] Kitchen Aortic valve regurgitation is not visualized.   7. Aortic dilatation noted. There is mild dilatation of the ascending  aorta, measuring 41 mm.   8. The inferior vena cava is normal in size with greater than 50%  respiratory variability, suggesting right atrial pressure of 3 mmHg.   FINDINGS   Left Ventricle: Left ventricular ejection fraction, by estimation, is 60  to 65%. The left ventricle has normal function. The left ventricular  internal cavity size was normal in size. There is mild left ventricular  hypertrophy. Left ventricular diastolic   parameters are indeterminate.   Right Ventricle: The right ventricular size is normal. Right vetricular  wall thickness was not assessed. Right ventricular systolic function is  low normal.    Left Atrium: Left atrial size was mildly dilated.   Right Atrium: Right atrial size was mildly dilated.   Pericardium: There is no evidence of pericardial effusion.   Mitral Valve: There is mild thickening of the mitral valve leaflet(s).  There is mild calcification of the mitral valve leaflet(s). Mild mitral  annular calcification. Mild to moderate mitral valve regurgitation.   Tricuspid Valve: The tricuspid valve is normal in structure. Tricuspid  valve regurgitation is mild.   Aortic Valve: AV is thickened, calcified with restricted motion. Diffiuclt  to discern individual leaflets. Peak and mean gradients through the valve  are 41 and 25 mm Hg respectively. AVA (by VTI) is 0.58 cm2. DVI is 0.2  Overall consistent with low flow,  severe AS. (SVI is 26). Aortic valve regurgitation is not visualized.  Aortic valve mean gradient measures 27.0 mmHg. Aortic valve peak gradient  measures 48.2 mmHg. Aortic valve area, by VTI measures 0.62 cm.   Pulmonic Valve: The pulmonic valve was grossly normal. Pulmonic valve  regurgitation is not visualized.   Aorta: The aortic root is normal in size and structure and aortic  dilatation noted. There is mild dilatation of the ascending aorta,  measuring 41 mm.  Venous: The inferior vena cava is normal in size with greater than 50%  respiratory variability, suggesting right atrial pressure of 3 mmHg.   IAS/Shunts: No atrial level shunt detected by color flow Doppler.     LEFT VENTRICLE  PLAX 2D  LVIDd:         5.00 cm  LVIDs:         2.90 cm  LV PW:         1.00 cm  LV IVS:        1.10 cm  LVOT diam:     2.00 cm  LV SV:         54  LV SV Index:   26  LVOT Area:     3.14 cm     RIGHT VENTRICLE  RV S prime:     9.57 cm/s  TAPSE (M-mode): 1.2 cm  RVSP:           24.9 mmHg   LEFT ATRIUM             Index        RIGHT ATRIUM           Index  LA diam:        5.20 cm 2.52 cm/m   RA Pressure: 3.00 mmHg  LA Vol (A2C):   76.9 ml  37.25 ml/m  RA Area:     19.60 cm  LA Vol (A4C):   60.1 ml 29.11 ml/m  RA Volume:   59.00 ml  28.58 ml/m  LA Biplane Vol: 70.1 ml 33.95 ml/m   AORTIC VALVE  AV Area (Vmax):    0.65 cm  AV Area (Vmean):   0.63 cm  AV Area (VTI):     0.62 cm  AV Vmax:           347.00 cm/s  AV Vmean:          237.000 cm/s  AV VTI:            0.870 m  AV Peak Grad:      48.2 mmHg  AV Mean Grad:      27.0 mmHg  LVOT Vmax:         71.90 cm/s  LVOT Vmean:        47.800 cm/s  LVOT VTI:          0.173 m  LVOT/AV VTI ratio: 0.20    AORTA  Ao Root diam: 3.10 cm  Ao Asc diam:  4.10 cm   MR Peak grad: 117.5 mmHg  TRICUSPID VALVE  MR Mean grad: 70.0 mmHg   TR Peak grad:   21.9 mmHg  MR Vmax:      542.00 cm/s TR Vmax:        234.00 cm/s  MR Vmean:     393.0 cm/s  Estimated RAP:  3.00 mmHg                            RVSP:           24.9 mmHg                              SHUNTS                            Systemic VTI:  0.17 m  Systemic Diam: 2.00 cm    Recent Labs: 06/03/2023: Pro B Natriuretic peptide (BNP) 232.0 07/20/2023: ALT 25; BUN 27; Creatinine, Ser 1.13; Hemoglobin 12.7; Platelets 184.0; Potassium 5.1; Sodium 139; TSH 1.07    Wt Readings from Last 3 Encounters:  12/23/23 92.5 kg  12/02/23 95.3 kg  10/14/23 93.2 kg    Assessment and Plan:   1. Severe Aortic Valve Stenosis: He has severe low flow/low gradient aortic stenosis. He has had severe AS for over a year now. I have personally reviewed his echo images. The leaflets are thickened and calcified with limited leaflet excursion. NYHA class 1. I discussed proceeding with his TAVR workup given the fact that he has has paradoxical low flow/low gradient AS for 1-2 years. He is feeling well and would like to delay TAVR workup until July 2025. He is going on a cruise in June with his family. I think it is reasonable to wait until then.Given advanced age, he is not a good candidate for surgical AVR. He may be a candidate  for TAVR.   I have reviewed the natural history of aortic stenosis with the patient and their family members  who are present today. We have discussed the limitations of medical therapy and the poor prognosis associated with symptomatic aortic stenosis. We have reviewed potential treatment options, including palliative medical therapy, conventional surgical aortic valve replacement, and transcatheter aortic valve replacement. We discussed treatment options in the context of the patient's specific comorbid medical conditions.   I will see him back in July 2025 and we will most likely begin his TAVR workup then. He will call back with any changes in his clinical status prior to then.    Labs/ tests ordered today include:  Orders Placed This Encounter  Procedures   EKG 12-Lead   Disposition:   F/U with me in 4 months  Signed, Verne Carrow, MD, [facility] 12/23/2023 11:54 AM    [facility] [address] [place], [place], [place]  [address] Phone: [phone]; Fax: [phone]

## 2023-12-23 NOTE — Patient Instructions (Signed)
 We will arrange your next appointment w Dr. Clifton James for July 2025.   Medication Instructions:  No changes *If you need a refill on your cardiac medications before your next appointment, please call your pharmacy*   Lab Work: none If you have labs (blood work) drawn today and your tests are completely normal, you will receive your results only by: MyChart Message (if you have MyChart) OR A paper copy in the mail If you have any lab test that is abnormal or we need to change your treatment, we will call you to review the results.   Testing/Procedures: none   Follow-Up: At [facility], you and your health needs are our priority.  As part of our continuing mission to provide you with exceptional heart care, we have created designated Provider Care Teams.  These Care Teams include your primary Cardiologist (physician) and Advanced Practice Providers (APPs -  Physician Assistants and Nurse Practitioners) who all work together to provide you with the care you need, when you need it.   Your next appointment:   4 month(s)  Provider:   Verne Carrow, MD

## 2024-01-03 ENCOUNTER — Ambulatory Visit: Attending: Cardiology | Admitting: *Deleted

## 2024-01-03 ENCOUNTER — Other Ambulatory Visit: Payer: Self-pay | Admitting: Family Medicine

## 2024-01-03 DIAGNOSIS — I4891 Unspecified atrial fibrillation: Secondary | ICD-10-CM

## 2024-01-03 DIAGNOSIS — Z5181 Encounter for therapeutic drug level monitoring: Secondary | ICD-10-CM

## 2024-01-03 LAB — POCT INR: INR: 2.6 (ref 2.0–3.0)

## 2024-01-03 NOTE — Patient Instructions (Signed)
 Description   *DOES NOT NEED A PRINTOUT* Continue taking 1 tablet daily except for 1/2 tablet on  Saturdays.  Stay consistent with greens each week (3 per week). Repeat INR in 4 weeks. Call us with any medication changes or concerns # [phone] Coumadin Clinic.  Allopurinol Daily started 1/3

## 2024-01-06 ENCOUNTER — Encounter (INDEPENDENT_AMBULATORY_CARE_PROVIDER_SITE_OTHER): Payer: Medicare HMO | Admitting: Ophthalmology

## 2024-01-06 DIAGNOSIS — H43813 Vitreous degeneration, bilateral: Secondary | ICD-10-CM | POA: Diagnosis not present

## 2024-01-06 DIAGNOSIS — I10 Essential (primary) hypertension: Secondary | ICD-10-CM | POA: Diagnosis not present

## 2024-01-06 DIAGNOSIS — H353112 Nonexudative age-related macular degeneration, right eye, intermediate dry stage: Secondary | ICD-10-CM | POA: Diagnosis not present

## 2024-01-06 DIAGNOSIS — H353221 Exudative age-related macular degeneration, left eye, with active choroidal neovascularization: Secondary | ICD-10-CM | POA: Diagnosis not present

## 2024-01-06 DIAGNOSIS — H35033 Hypertensive retinopathy, bilateral: Secondary | ICD-10-CM | POA: Diagnosis not present

## 2024-01-08 ENCOUNTER — Other Ambulatory Visit: Payer: Self-pay | Admitting: Family Medicine

## 2024-01-08 DIAGNOSIS — E1169 Type 2 diabetes mellitus with other specified complication: Secondary | ICD-10-CM

## 2024-01-11 ENCOUNTER — Ambulatory Visit: Payer: Medicare HMO | Attending: Cardiology | Admitting: Cardiology

## 2024-01-11 ENCOUNTER — Encounter: Payer: Self-pay | Admitting: Cardiology

## 2024-01-11 VITALS — BP 110/60 | HR 72 | Ht 68.0 in | Wt 205.4 lb

## 2024-01-11 DIAGNOSIS — I350 Nonrheumatic aortic (valve) stenosis: Secondary | ICD-10-CM

## 2024-01-11 NOTE — Patient Instructions (Signed)
 Medication Instructions:  Your physician recommends that you continue on your current medications as directed. Please refer to the Current Medication list given to you today.  *If you need a refill on your cardiac medications before your next appointment, please call your pharmacy*  Lab Work: NONE If you have labs (blood work) drawn today and your tests are completely normal, you will receive your results only by: MyChart Message (if you have MyChart) OR A paper copy in the mail If you have any lab test that is abnormal or we need to change your treatment, we will call you to review the results.  Testing/Procedures: NONE  Follow-Up: At [facility], you and your health needs are our priority.  As part of our continuing mission to provide you with exceptional heart care, our providers are all part of one team.  This team includes your primary Cardiologist (physician) and Advanced Practice Providers or APPs (Physician Assistants and Nurse Practitioners) who all work together to provide you with the care you need, when you need it.  Your next appointment:   1 year(s)  Provider:   Donato Schultz, MD   We recommend signing up for the patient portal called "MyChart".  Sign up information is provided on this After Visit Summary.  MyChart is used to connect with patients for Virtual Visits (Telemedicine).  Patients are able to view lab/test results, encounter notes, upcoming appointments, etc.  Non-urgent messages can be sent to your provider as well.   To learn more about what you can do with MyChart, go to [email or web address].   Other Instructions       1st Floor: - Lobby - Registration  - Pharmacy  - Lab - Cafe  2nd Floor: - PV Lab - Diagnostic Testing (echo, CT, nuclear med)  3rd Floor: - Vacant  4th Floor: - TCTS (cardiothoracic surgery) - AFib Clinic - Structural Heart Clinic - Vascular Surgery  - Vascular Ultrasound  5th Floor: - HeartCare Cardiology  (general and EP) - Clinical Pharmacy for coumadin, hypertension, lipid, weight-loss medications, and med management appointments    Valet parking services will be available as well.

## 2024-01-11 NOTE — Progress Notes (Signed)
  Cardiology Office Note:  .   Date:  01/11/2024  ID:  Marvin Foster, DOB [birth year], MRN [id number] PCP: Sheliah Hatch, MD  [facility] Providers Cardiologist:  Donato Schultz, MD    History of Present Illness: .   Marvin Foster is a 89 y.o. male Discussed the use of AI scribe software for clinical note transcription with the patient, who gave verbal consent to proceed.  History of Present Illness Marvin Foster is an 89 year old male with aortic stenosis who presents for follow-up.  He has been diagnosed with severe low flow, low gradient aortic stenosis for over a year.  No chest pain or shortness of breath. He is currently asymptomatic with no reported limitations in his daily activities.    Studies Reviewed: .        Results   Risk Assessment/Calculations:            Physical Exam:   VS:  BP 110/60   Pulse 72   Ht 5\' 8"  (1.727 m)   Wt 205 lb 6.4 oz (93.2 kg)   SpO2 96%   BMI 31.23 kg/m    Wt Readings from Last 3 Encounters:  01/11/24 205 lb 6.4 oz (93.2 kg)  12/23/23 204 lb (92.5 kg)  12/02/23 210 lb (95.3 kg)    GEN: Well nourished, well developed in no acute distress NECK: No JVD; No carotid bruits CARDIAC: RRR, 3/6 SM, no rubs, no gallops RESPIRATORY:  Clear to auscultation without rales, wheezing or rhonchi  ABDOMEN: Soft, non-tender, non-distended EXTREMITIES:  No edema; No deformity   ASSESSMENT AND PLAN: .    Assessment and Plan Assessment & Plan Severe aortic valve stenosis He has severe low flow, low gradient aortic stenosis, present for over a year. Currently asymptomatic with no chest pain or dyspnea. The condition poses no immediate emergency for treatment, but could lead to symptoms such as dyspnea, reduced energy, and potential heart failure over time. He plans a cruise in June and prefers to delay further TAVR workup until after the trip. The structural clinic will evaluate him in July. The analogy of a garden hose was used to  explain the valve tightness and resulting heart murmur. - Schedule follow-up with structural clinic in July for further TAVR workup. - Monitor for any new symptoms such as chest pain or dyspnea. - Reassess the need for TAVR post-cruise.           Signed, Donato Schultz, MD

## 2024-01-16 ENCOUNTER — Other Ambulatory Visit: Payer: Self-pay | Admitting: Family Medicine

## 2024-01-16 DIAGNOSIS — E1169 Type 2 diabetes mellitus with other specified complication: Secondary | ICD-10-CM

## 2024-01-18 ENCOUNTER — Ambulatory Visit (INDEPENDENT_AMBULATORY_CARE_PROVIDER_SITE_OTHER): Payer: Medicare HMO | Admitting: Family Medicine

## 2024-01-18 ENCOUNTER — Encounter: Payer: Self-pay | Admitting: Family Medicine

## 2024-01-18 VITALS — BP 120/58 | HR 98 | Temp 98.6°F | Ht 68.0 in | Wt 207.2 lb

## 2024-01-18 DIAGNOSIS — E1139 Type 2 diabetes mellitus with other diabetic ophthalmic complication: Secondary | ICD-10-CM

## 2024-01-18 DIAGNOSIS — E785 Hyperlipidemia, unspecified: Secondary | ICD-10-CM | POA: Diagnosis not present

## 2024-01-18 DIAGNOSIS — I10 Essential (primary) hypertension: Secondary | ICD-10-CM | POA: Diagnosis not present

## 2024-01-18 DIAGNOSIS — E039 Hypothyroidism, unspecified: Secondary | ICD-10-CM | POA: Diagnosis not present

## 2024-01-18 DIAGNOSIS — E1169 Type 2 diabetes mellitus with other specified complication: Secondary | ICD-10-CM

## 2024-01-18 DIAGNOSIS — Z7984 Long term (current) use of oral hypoglycemic drugs: Secondary | ICD-10-CM | POA: Diagnosis not present

## 2024-01-18 LAB — HEPATIC FUNCTION PANEL
ALT: 35 U/L (ref 0–53)
AST: 35 U/L (ref 0–37)
Albumin: 4.3 g/dL (ref 3.5–5.2)
Alkaline Phosphatase: 69 U/L (ref 39–117)
Bilirubin, Direct: 0.3 mg/dL (ref 0.0–0.3)
Total Bilirubin: 0.9 mg/dL (ref 0.2–1.2)
Total Protein: 6.9 g/dL (ref 6.0–8.3)

## 2024-01-18 LAB — CBC WITH DIFFERENTIAL/PLATELET
Basophils Absolute: 0.1 10*3/uL (ref 0.0–0.1)
Basophils Relative: 1.1 % (ref 0.0–3.0)
Eosinophils Absolute: 0.3 10*3/uL (ref 0.0–0.7)
Eosinophils Relative: 4.3 % (ref 0.0–5.0)
HCT: 35.8 % — ABNORMAL LOW (ref 39.0–52.0)
Hemoglobin: 12 g/dL — ABNORMAL LOW (ref 13.0–17.0)
Lymphocytes Relative: 29.3 % (ref 12.0–46.0)
Lymphs Abs: 1.9 10*3/uL (ref 0.7–4.0)
MCHC: 33.4 g/dL (ref 30.0–36.0)
MCV: 95.7 fl (ref 78.0–100.0)
Monocytes Absolute: 0.6 10*3/uL (ref 0.1–1.0)
Monocytes Relative: 8.9 % (ref 3.0–12.0)
Neutro Abs: 3.7 10*3/uL (ref 1.4–7.7)
Neutrophils Relative %: 56.4 % (ref 43.0–77.0)
Platelets: 169 10*3/uL (ref 150.0–400.0)
RBC: 3.74 Mil/uL — ABNORMAL LOW (ref 4.22–5.81)
RDW: 15.9 % — ABNORMAL HIGH (ref 11.5–15.5)
WBC: 6.5 10*3/uL (ref 4.0–10.5)

## 2024-01-18 LAB — BASIC METABOLIC PANEL WITH GFR
BUN: 24 mg/dL — ABNORMAL HIGH (ref 6–23)
CO2: 29 meq/L (ref 19–32)
Calcium: 9.5 mg/dL (ref 8.4–10.5)
Chloride: 101 meq/L (ref 96–112)
Creatinine, Ser: 1.12 mg/dL (ref 0.40–1.50)
GFR: 58.82 mL/min — ABNORMAL LOW (ref >60.00)
Glucose, Bld: 135 mg/dL — ABNORMAL HIGH (ref 70–99)
Potassium: 4.7 meq/L (ref 3.5–5.1)
Sodium: 139 meq/L (ref 135–145)

## 2024-01-18 LAB — LIPID PANEL
Cholesterol: 91 mg/dL (ref 0–200)
HDL: 29 mg/dL — ABNORMAL LOW (ref >39.00)
LDL Cholesterol: 38 mg/dL (ref 0–99)
NonHDL: 62.4
Total CHOL/HDL Ratio: 3
Triglycerides: 121 mg/dL (ref 0.0–149.0)
VLDL: 24.2 mg/dL (ref 0.0–40.0)

## 2024-01-18 LAB — TSH: TSH: 1.17 u[IU]/mL (ref 0.35–5.50)

## 2024-01-18 LAB — HEMOGLOBIN A1C: Hgb A1c MFr Bld: 6.8 % — ABNORMAL HIGH (ref 4.6–6.5)

## 2024-01-18 NOTE — Assessment & Plan Note (Signed)
 Chronic problem.  On Lipitor 40mg  daily w/o difficulty.  Check labs.  Adjust meds prn  ?

## 2024-01-18 NOTE — Assessment & Plan Note (Signed)
 Chronic problem.  Currently well controlled on Amlodipine 2.5mg  daily, Lasix 40mg  daily, and Metoprolol 100mg  BID.  Asymptomatic.  Check labs due to diuretic use but no anticipated med changes.

## 2024-01-18 NOTE — Assessment & Plan Note (Signed)
Chronic problem.  On Levothyroxine 75mcg daily.  Check labs.  Adjust meds prn  

## 2024-01-18 NOTE — Progress Notes (Signed)
   Subjective:    Patient ID: Marvin Foster, male    DOB: [birth year], 89 y.o.   MRN: [id number]  HPI DM- chronic problem, on Metformin 1000mg  BID.  UTD on eye exam, foot exam.  Denies symptomatic lows.  No changes to known neuropathy.  HTN- chronic problem, on Amlodipine 2.5mg  daily, Lasix40mg  daily, Metoprolol 100mg  BID w/ good control.  No CP, SOB, HA's.  Hypothyroid- chronic problem, on Levothyroxine daily.  Reports energy is stable.  No changes to skin/hair/nails.  Hyperlipidemia- chronic problem, on Lipitor 40mg  daily.  No abd pain, N/V.   Review of Systems For ROS see HPI    Objective:   Physical Exam Vitals reviewed.  Constitutional:      General: He is not in acute distress.    Appearance: Normal appearance. He is well-developed. He is not ill-appearing.  HENT:     Head: Normocephalic and atraumatic.  Eyes:     Extraocular Movements: Extraocular movements intact.     Conjunctiva/sclera: Conjunctivae normal.     Pupils: Pupils are equal, round, and reactive to light.  Neck:     Thyroid: No thyromegaly.  Cardiovascular:     Rate and Rhythm: Normal rate. Rhythm irregular.     Pulses: Normal pulses.     Heart sounds: Murmur (II-III/VI SEM) heard.  Pulmonary:     Effort: Pulmonary effort is normal. No respiratory distress.     Breath sounds: Normal breath sounds.  Abdominal:     General: Bowel sounds are normal. There is no distension.     Palpations: Abdomen is soft.  Musculoskeletal:     Cervical back: Normal range of motion and neck supple.     Right lower leg: No edema.     Left lower leg: No edema.  Lymphadenopathy:     Cervical: No cervical adenopathy.  Skin:    General: Skin is warm and dry.  Neurological:     General: No focal deficit present.     Mental Status: He is alert and oriented to person, place, and time.     Cranial Nerves: No cranial nerve deficit.  Psychiatric:        Mood and Affect: Mood normal.        Behavior: Behavior normal.            Assessment & Plan:

## 2024-01-18 NOTE — Patient Instructions (Signed)
 Schedule your complete physical in 6 months We'll notify you of your lab results and make any changes if needed Keep up the good work on healthy diet and walk as you are able Call with any questions or concerns Stay Safe!  Stay Healthy! Happy Spring!!!

## 2024-01-18 NOTE — Assessment & Plan Note (Signed)
 Chronic problem.  On Metformin 1000mg  BID w/o difficulty.  UTD on eye exam, foot exam.  Currently asymptomatic w/ exception of stable neuropathy.  Check labs.  Adjust meds prn

## 2024-01-19 ENCOUNTER — Telehealth: Payer: Self-pay

## 2024-01-19 NOTE — Telephone Encounter (Signed)
 Pt has reviewed via MyChart

## 2024-01-19 NOTE — Telephone Encounter (Signed)
-----   Message from Neena Rhymes sent at 01/18/2024  3:54 PM EDT ----- Labs are stable and overall look good!  No changes at this time

## 2024-01-27 ENCOUNTER — Other Ambulatory Visit: Payer: Self-pay | Admitting: Family Medicine

## 2024-01-31 ENCOUNTER — Ambulatory Visit: Attending: Cardiology

## 2024-01-31 DIAGNOSIS — I4891 Unspecified atrial fibrillation: Secondary | ICD-10-CM

## 2024-01-31 DIAGNOSIS — Z5181 Encounter for therapeutic drug level monitoring: Secondary | ICD-10-CM

## 2024-01-31 LAB — POCT INR: INR: 1.5 — AB (ref 2.0–3.0)

## 2024-01-31 NOTE — Patient Instructions (Signed)
*  DOES NOT NEED A PRINTOUT* Take 2 tablets today only then Continue taking 1 tablet daily except for 1/2 tablet on  Saturdays.  Stay consistent with greens each week (3 per week). Repeat INR in 3 weeks. Call us  with any medication changes or concerns # [phone] Coumadin  Clinic.  Allopurinol  Daily started 1/3

## 2024-02-03 ENCOUNTER — Encounter (INDEPENDENT_AMBULATORY_CARE_PROVIDER_SITE_OTHER): Admitting: Ophthalmology

## 2024-02-03 DIAGNOSIS — H353221 Exudative age-related macular degeneration, left eye, with active choroidal neovascularization: Secondary | ICD-10-CM

## 2024-02-03 DIAGNOSIS — H35033 Hypertensive retinopathy, bilateral: Secondary | ICD-10-CM | POA: Diagnosis not present

## 2024-02-03 DIAGNOSIS — I10 Essential (primary) hypertension: Secondary | ICD-10-CM | POA: Diagnosis not present

## 2024-02-03 DIAGNOSIS — H353112 Nonexudative age-related macular degeneration, right eye, intermediate dry stage: Secondary | ICD-10-CM

## 2024-02-03 DIAGNOSIS — H35371 Puckering of macula, right eye: Secondary | ICD-10-CM | POA: Diagnosis not present

## 2024-02-03 DIAGNOSIS — H43813 Vitreous degeneration, bilateral: Secondary | ICD-10-CM | POA: Diagnosis not present

## 2024-02-22 ENCOUNTER — Ambulatory Visit: Attending: Cardiology

## 2024-02-22 DIAGNOSIS — M1712 Unilateral primary osteoarthritis, left knee: Secondary | ICD-10-CM | POA: Diagnosis not present

## 2024-02-22 DIAGNOSIS — I4891 Unspecified atrial fibrillation: Secondary | ICD-10-CM

## 2024-02-22 DIAGNOSIS — Z5181 Encounter for therapeutic drug level monitoring: Secondary | ICD-10-CM | POA: Diagnosis not present

## 2024-02-22 LAB — POCT INR: INR: 2.2 (ref 2.0–3.0)

## 2024-02-22 NOTE — Patient Instructions (Signed)
 Description   *DOES NOT NEED A PRINTOUT* Continue taking 1 tablet daily except for 1/2 tablet on  Saturdays.  Stay consistent with greens each week (3 per week). Repeat INR in 4 weeks. Call us with any medication changes or concerns # [phone] Coumadin Clinic.  Allopurinol Daily started 1/3

## 2024-03-03 ENCOUNTER — Other Ambulatory Visit: Payer: Self-pay | Admitting: Family Medicine

## 2024-03-03 ENCOUNTER — Other Ambulatory Visit: Payer: Self-pay | Admitting: Cardiology

## 2024-03-06 NOTE — Telephone Encounter (Signed)
 Refill request for warfarin:  Last INR was 2.2 on 02/22/24 Next INR due 03/21/24 LOV was 01/11/24  Refill approved.

## 2024-03-08 ENCOUNTER — Encounter (INDEPENDENT_AMBULATORY_CARE_PROVIDER_SITE_OTHER): Admitting: Ophthalmology

## 2024-03-08 DIAGNOSIS — I10 Essential (primary) hypertension: Secondary | ICD-10-CM | POA: Diagnosis not present

## 2024-03-08 DIAGNOSIS — H35033 Hypertensive retinopathy, bilateral: Secondary | ICD-10-CM

## 2024-03-08 DIAGNOSIS — H43813 Vitreous degeneration, bilateral: Secondary | ICD-10-CM

## 2024-03-08 DIAGNOSIS — H353112 Nonexudative age-related macular degeneration, right eye, intermediate dry stage: Secondary | ICD-10-CM | POA: Diagnosis not present

## 2024-03-08 DIAGNOSIS — H353221 Exudative age-related macular degeneration, left eye, with active choroidal neovascularization: Secondary | ICD-10-CM | POA: Diagnosis not present

## 2024-03-16 DIAGNOSIS — M1712 Unilateral primary osteoarthritis, left knee: Secondary | ICD-10-CM | POA: Diagnosis not present

## 2024-03-18 ENCOUNTER — Other Ambulatory Visit: Payer: Self-pay | Admitting: Family Medicine

## 2024-03-21 ENCOUNTER — Ambulatory Visit: Attending: Cardiology

## 2024-03-21 DIAGNOSIS — Z5181 Encounter for therapeutic drug level monitoring: Secondary | ICD-10-CM

## 2024-03-21 DIAGNOSIS — I4891 Unspecified atrial fibrillation: Secondary | ICD-10-CM

## 2024-03-21 LAB — POCT INR: INR: 2.7 (ref 2.0–3.0)

## 2024-03-21 NOTE — Patient Instructions (Signed)
 Description   *DOES NOT NEED A PRINTOUT* Continue taking 1 tablet daily except for 1/2 tablet on  Saturdays.   Stay consistent with greens each week (3 per week).  Repeat INR in 5 weeks.  Call us  with any medication changes or concerns # [phone] Coumadin  Clinic.   Allopurinol  Daily started 1/3

## 2024-04-06 ENCOUNTER — Telehealth: Payer: Self-pay | Admitting: Cardiology

## 2024-04-06 NOTE — Telephone Encounter (Signed)
 Pt's daughter is requesting a callback regarding pt feeling very fatigue and weak lately. She stated she was advised by MD to contact the office if these things started happening. Please advise

## 2024-04-06 NOTE — Telephone Encounter (Signed)
 Spoke with Marvin Foster per DPR. She's her dad has stated he is ready to come in to talk about a TAVR. Marvin Foster he has a appointment to come in and talk to Dr Verlin about that next month. Gave her dates and times for that appointment and another appointment set with us . Marvin Foster stated understanding.

## 2024-04-07 ENCOUNTER — Other Ambulatory Visit: Payer: Self-pay | Admitting: Family Medicine

## 2024-04-11 ENCOUNTER — Encounter (INDEPENDENT_AMBULATORY_CARE_PROVIDER_SITE_OTHER): Admitting: Ophthalmology

## 2024-04-11 DIAGNOSIS — H353221 Exudative age-related macular degeneration, left eye, with active choroidal neovascularization: Secondary | ICD-10-CM

## 2024-04-11 DIAGNOSIS — I10 Essential (primary) hypertension: Secondary | ICD-10-CM

## 2024-04-11 DIAGNOSIS — H35371 Puckering of macula, right eye: Secondary | ICD-10-CM | POA: Diagnosis not present

## 2024-04-11 DIAGNOSIS — H35033 Hypertensive retinopathy, bilateral: Secondary | ICD-10-CM

## 2024-04-11 DIAGNOSIS — H353112 Nonexudative age-related macular degeneration, right eye, intermediate dry stage: Secondary | ICD-10-CM | POA: Diagnosis not present

## 2024-04-11 DIAGNOSIS — H43813 Vitreous degeneration, bilateral: Secondary | ICD-10-CM | POA: Diagnosis not present

## 2024-04-12 ENCOUNTER — Encounter: Payer: Self-pay | Admitting: Family Medicine

## 2024-04-12 ENCOUNTER — Ambulatory Visit: Admitting: Family Medicine

## 2024-04-12 ENCOUNTER — Telehealth: Payer: Self-pay

## 2024-04-12 VITALS — BP 120/64 | HR 74 | Temp 99.5°F | Ht 68.0 in | Wt 207.4 lb

## 2024-04-12 DIAGNOSIS — R350 Frequency of micturition: Secondary | ICD-10-CM

## 2024-04-12 LAB — POCT URINALYSIS DIPSTICK
Bilirubin, UA: NEGATIVE
Blood, UA: NEGATIVE
Glucose, UA: NEGATIVE
Ketones, UA: NEGATIVE
Leukocytes, UA: NEGATIVE
Nitrite, UA: NEGATIVE
Protein, UA: NEGATIVE
Spec Grav, UA: 1.01 (ref 1.010–1.025)
Urobilinogen, UA: 0.2 U/dL
pH, UA: 6 (ref 5.0–8.0)

## 2024-04-12 MED ORDER — TOLTERODINE TARTRATE 1 MG PO TABS: 1.0000 mg | ORAL_TABLET | Freq: Two times a day (BID) | ORAL | 3 refills | Status: DC

## 2024-04-12 NOTE — Patient Instructions (Addendum)
 Follow up as needed or as scheduled START the Tolterodine twice daily for overactive bladder CONTINUE the Tamsulosin  (Flomax ) Call with any questions or concerns Happy 4th!!!

## 2024-04-12 NOTE — Telephone Encounter (Signed)
 ERROR

## 2024-04-12 NOTE — Progress Notes (Signed)
   Subjective:    Patient ID: Marvin Foster, male    DOB: 28-May-1934, 89 y.o.   MRN: [id number]  HPI Urine frequency- occurring during the day.  Son feels sxs started ~2 months ago.  He feels that he is emptying completely.  Has good water intake.  No burning w/ urination.  Does have urgency and at times will have accidents.  No odor, discoloration, blood.  Frequency varies.  No recent changes to lasix .  Already on Flomax .  Not having nighttime sxs   Review of Systems For ROS see HPI     Objective:   Physical Exam Vitals reviewed.  Constitutional:      General: He is not in acute distress.    Appearance: Normal appearance. He is not ill-appearing.  HENT:     Head: Normocephalic and atraumatic.  Abdominal:     Tenderness: There is no abdominal tenderness. There is no right CVA tenderness, left CVA tenderness or guarding.  Skin:    General: Skin is warm and dry.  Neurological:     General: No focal deficit present.     Mental Status: He is alert and oriented to person, place, and time.  Psychiatric:        Mood and Affect: Mood normal.        Behavior: Behavior normal.        Thought Content: Thought content normal.           Assessment & Plan:  Urinary frequency- new.  Pt's sxs seem isolated to frequency and not consistent w/ infxn.  UA unremarkable but will send for cx to be sure.  Suspect this is neurogenic/OAB.  Will start low dose Detrol  and titrate.  Pt already on Flomax  so hopefully these will act synergistically and improve sxs.  Pt expressed understanding and is in agreement w/ plan.

## 2024-04-14 LAB — URINE CULTURE
MICRO NUMBER:: 16660506
Result:: NO GROWTH
SPECIMEN QUALITY:: ADEQUATE

## 2024-04-15 ENCOUNTER — Ambulatory Visit: Payer: Self-pay | Admitting: Family Medicine

## 2024-04-16 NOTE — Progress Notes (Signed)
Called and unable to leave vm

## 2024-04-18 NOTE — Progress Notes (Signed)
 Pt has been notified.

## 2024-04-25 ENCOUNTER — Other Ambulatory Visit: Payer: Self-pay | Admitting: Family Medicine

## 2024-04-25 ENCOUNTER — Ambulatory Visit: Attending: Cardiology

## 2024-04-25 DIAGNOSIS — I4891 Unspecified atrial fibrillation: Secondary | ICD-10-CM

## 2024-04-25 DIAGNOSIS — E1169 Type 2 diabetes mellitus with other specified complication: Secondary | ICD-10-CM

## 2024-04-25 DIAGNOSIS — Z5181 Encounter for therapeutic drug level monitoring: Secondary | ICD-10-CM

## 2024-04-25 LAB — POCT INR: INR: 2.5 (ref 2.0–3.0)

## 2024-04-25 NOTE — Progress Notes (Signed)
Please see anticoagulation encounter.

## 2024-04-25 NOTE — Patient Instructions (Signed)
 Description   *DOES NOT NEED A PRINTOUT* Continue taking 1 tablet daily except for 1/2 tablet on  Saturdays.   Stay consistent with greens each week (3 per week).  Repeat INR in 6 weeks.  Call us  with any medication changes or concerns # [phone] Coumadin  Clinic.   Allopurinol  Daily started 1/3

## 2024-04-29 ENCOUNTER — Other Ambulatory Visit: Payer: Self-pay | Admitting: Family Medicine

## 2024-05-09 ENCOUNTER — Other Ambulatory Visit: Payer: Self-pay

## 2024-05-09 ENCOUNTER — Encounter: Payer: Self-pay | Admitting: Cardiovascular Disease

## 2024-05-09 ENCOUNTER — Ambulatory Visit: Attending: Cardiology | Admitting: Cardiovascular Disease

## 2024-05-09 VITALS — BP 104/60 | HR 67 | Ht 68.0 in | Wt 208.4 lb

## 2024-05-09 DIAGNOSIS — Z01812 Encounter for preprocedural laboratory examination: Secondary | ICD-10-CM | POA: Diagnosis not present

## 2024-05-09 DIAGNOSIS — I350 Nonrheumatic aortic (valve) stenosis: Secondary | ICD-10-CM

## 2024-05-09 NOTE — Progress Notes (Signed)
 Structural Heart Clinic Note  Chief Complaint  Patient presents with   Follow-up    Aortic stenosis    History of Present Illness: 89 yo male with history of persistent atrial fibrillation, CAD, DM, hyperlipidemia, HTN, hypothyroidism, RBBB and aortic stenosis who is here today for follow up. I saw him as a new consult in the structural heart clinic in February 2024. He has persistent atrial fibrillation and is on coumadin . He has CAD and had an LAD stent placed in 2006. Echo January 2024 with normal LV systolic function and severe aortic stenosis. He was feeling well when I met him in February 2024 so we elected to follow his aortic stenosis. Repeat echocardiogram in September 2024 and March 2025 with severe low flow, low gradient aortic stenosis but he remained asymptomatic so we elected to continue following his aortic stenosis. Echo 12/19/23 with LVEF=60-65%. Mild to moderate MR. Low flow, low gradient severe AS with mean gradient 25 mmHg, AVA 0.58 cm2, DI 0.2, SVI 26.   He is here today for follow up. He has progressive dyspnea on exertion. The patient denies any chest pain, palpitations, orthopnea, PND, dizziness, near syncope or syncope. He has chronic lower extremity edema.   He lives in [place] alone. He is retired from [employer]. He has dentures and no active dental issues.   Primary Care Physician: [name] BRAVO, MD Primary Cardiologist: Jeffrie Referring Cardiologist: Jeffrie  Past Medical History:  Diagnosis Date   Acute respiratory failure due to COVID-19 [facility]) 07/07/2020   ANEMIA 02/18/2010   Qualifier: Diagnosis of  By: Mahlon MD, Comer     Arthritis 12/2005   left hand   Arthritis    Left knee   ASPARTATE AMINOTRANSFERASE, SERUM, ELEVATED 11/04/2008   Qualifier: Diagnosis of  By: Mahlon MD, Katherine     Atrial fibrillation (HCC) 07/24/2013   Blood transfusion without reported diagnosis    Bursitis of knee    right knee   CAD (coronary artery  disease)    Cardiac arrhythmia due to congenital heart disease    Chronic anticoagulation 06/12/2015   Coagulation defect (HCC) 05/28/2020   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   CORONARY ARTERY DISEASE 03/27/2007   Qualifier: Diagnosis of  By: Tita MD, Luis     Diabetes mellitus    Diverticulosis    Glaucoma    left eye   Hx of colonic polyps 06/12/2015   Hyperlipidemia    Hypertension    Hypothyroid 09/21/2018   Long term current use of anticoagulant therapy 07/24/2013   Neuropathy    with pain   Pain due to onychomycosis of toenail of left foot 05/28/2020   Permanent atrial fibrillation (HCC)    RBBB    Scrotal mass 03/07/2012   Spinal stenosis 01/20/2005   Tubular adenoma of colon     Past Surgical History:  Procedure Laterality Date   BACK SURGERY     BIOPSY  09/16/2022   Procedure: BIOPSY;  Surgeon: [name], MD;  Location: [facility];  Service: Gastroenterology;;   CATARACT EXTRACTION  08/11/2009   right   COLONOSCOPY WITH PROPOFOL  N/A 09/16/2022   Procedure: COLONOSCOPY WITH PROPOFOL ;  Surgeon: [name], MD;  Location: [facility];  Service: Gastroenterology;  Laterality: N/A;   corneal endothelial Left 2013   CORNEAL TRANSPLANT Left    eye   CORONARY ANGIOPLASTY WITH STENT PLACEMENT     ESOPHAGOGASTRODUODENOSCOPY (EGD) WITH PROPOFOL  N/A 09/16/2022  Procedure: ESOPHAGOGASTRODUODENOSCOPY (EGD) WITH PROPOFOL ;  Surgeon: [name], MD;  Location: [facility];  Service: Gastroenterology;  Laterality: N/A;   EYE SURGERY     HOT HEMOSTASIS N/A 09/16/2022   Procedure: HOT HEMOSTASIS (ARGON PLASMA COAGULATION/BICAP);  Surgeon: [name], MD;  Location: [facility];  Service: Gastroenterology;  Laterality: N/A;   POLYPECTOMY  09/16/2022   Procedure: POLYPECTOMY;  Surgeon: [name], MD;  Location: [facility];  Service: Gastroenterology;;   ROTATOR CUFF REPAIR     SMALL INTESTINE SURGERY      SPINE SURGERY      Current Outpatient Medications  Medication Sig Dispense Refill   allopurinol  (ZYLOPRIM ) 100 MG tablet Take 1 tablet (100 mg total) by mouth daily. 90 tablet 1   amLODipine  (NORVASC ) 2.5 MG tablet Take 1 tablet by mouth once daily 90 tablet 2   atorvastatin  (LIPITOR) 40 MG tablet TAKE 1 TABLET BY MOUTH AT BEDTIME 90 tablet 0   BESIVANCE 0.6 % SUSP Place 1 drop into the left eye 4 (four) times daily. Pt use drop after each monthly eye injection.     Calcium  Carbonate-Vitamin D (CALCIUM  600 + D PO) Take 1 tablet by mouth 2 (two) times daily.     diclofenac  Sodium (VOLTAREN ) 1 % GEL Apply 2 g topically 4 (four) times daily. 100 g 1   ferrous sulfate 325 (65 FE) MG tablet Take 325 mg by mouth daily.     fluorometholone  (FML) 0.1 % ophthalmic suspension Place 1 drop into both eyes every 30 (thirty) days.      furosemide  (LASIX ) 40 MG tablet Take 1 tablet by mouth once daily 90 tablet 0   Lancets (ONETOUCH ULTRASOFT) lancets Use as instructed 100 each 12   latanoprost  (XALATAN ) 0.005 % ophthalmic solution SMARTSIG:In Eye(s)     levothyroxine  (SYNTHROID ) 75 MCG tablet Take 1 tablet (75 mcg total) by mouth daily. 90 tablet 0   magnesium  oxide (MAGOX 400) 400 (241.3 Mg) MG tablet Take 1 tablet (400 mg total) by mouth 2 (two) times daily. 60 tablet 11   metFORMIN  (GLUCOPHAGE ) 1000 MG tablet Take 1 tablet by mouth twice daily 180 tablet 0   metoprolol  tartrate (LOPRESSOR ) 100 MG tablet Take 1 tablet by mouth twice daily 180 tablet 0   Multiple Vitamin (MULTIVITAMIN) capsule Take 1 capsule by mouth daily.     Omega-3 Fatty Acids (FISH OIL) 1200 MG CAPS Take 3 capsules by mouth daily.     ONETOUCH VERIO test strip USE 1 STRIP TO CHECK GLUCOSE TWICE DAILY AS DIRECTED 100 each 0   tamsulosin  (FLOMAX ) 0.4 MG CAPS capsule Take 1 capsule (0.4 mg total) by mouth daily. 90 capsule 1   timolol  (TIMOPTIC ) 0.5 % ophthalmic solution Place 1 drop into both eyes daily.      tolterodine  (DETROL ) 1  MG tablet Take 1 tablet (1 mg total) by mouth 2 (two) times daily. 60 tablet 3   warfarin (COUMADIN ) 2.5 MG tablet TAKE 1/2 TO 1 (ONE-HALF TO ONE) TABLET BY MOUTH ONCE DAILY OR  AS  DIRECTED  BY  ANTICOAGULATION  CLINIC 100 tablet 1   cetirizine  (ZYRTEC ) 10 MG tablet Take 1 tablet (10 mg total) by mouth daily. (Patient not taking: Reported on 05/09/2024) 90 tablet 1   omeprazole  (PRILOSEC) 40 MG capsule Take 1 capsule (40 mg total) by mouth 2 (two) times daily for 14 days. (Patient not taking: Reported on 05/09/2024) 28 capsule 0   No current facility-administered medications for this visit.  Allergies  Allergen Reactions   Neomycin-Bacitracin Zn-Polymyx Rash   Penicillins Other (See Comments) and Rash    Pt states this was diagnosed years ago, unsure as to reaction. Will not use.    Enalapril      Stopped 11/2020 due to hyperkalemia due to this   Miconazole Nitrate     Unsure of reaction    Sulfa Antibiotics     Pt not aware of allergy     Social History   Socioeconomic History   Marital status: Divorced    Spouse name: Not on file   Number of children: 5   Years of education: Not on file   Highest education level: 12th grade  Occupational History   Occupation: retired   Occupation: retired   Occupation: Retired from [employer]  Tobacco Use   Smoking status: Never   Smokeless tobacco: Never  Vaping Use   Vaping status: Never Used  Substance and Sexual Activity   Alcohol use: Not Currently    Comment: Very seldom---socially   Drug use: No   Sexual activity: Not Currently    Partners: Female  Other Topics Concern   Not on file  Social History Narrative   Volunteers at [employer]         Social Drivers of Health   Financial Resource Strain: Low Risk  (01/17/2024)   Overall Financial Resource Strain (CARDIA)    Difficulty of Paying Living Expenses: Not hard at all  Food Insecurity: No Food Insecurity (01/17/2024)   Hunger Vital Sign    Worried About Running Out of  Food in the Last Year: Never true    Ran Out of Food in the Last Year: Never true  Transportation Needs: No Transportation Needs (01/17/2024)   PRAPARE - Administrator, Civil Service (Medical): No    Lack of Transportation (Non-Medical): No  Physical Activity: Inactive (01/17/2024)   Exercise Vital Sign    Days of Exercise per Week: 0 days    Minutes of Exercise per Session: 0 min  Stress: No Stress Concern Present (01/17/2024)   [employer] of Occupational Health - Occupational Stress Questionnaire    Feeling of Stress : Not at all  Social Connections: Moderately Isolated (01/17/2024)   Social Connection and Isolation Panel    Frequency of Communication with Friends and Family: More than three times a week    Frequency of Social Gatherings with Friends and Family: Three times a week    Attends Religious Services: Never    Active Member of Clubs or Organizations: No    Attends Engineer, structural: More than 4 times per year    Marital Status: Divorced  Intimate Partner Violence: Not At Risk (05/11/2023)   Humiliation, Afraid, Rape, and Kick questionnaire    Fear of Current or Ex-Partner: No    Emotionally Abused: No    Physically Abused: No    Sexually Abused: No    Family History  Problem Relation Age of Onset   Heart failure Mother        Heart attack age 51   Heart failure Father        Heart attack age 73   Stomach cancer Neg Hx    Colon cancer Neg Hx    Pancreatic cancer Neg Hx    Throat cancer Neg Hx     Review of Systems:  As stated in the HPI and otherwise negative.   BP 104/60   Pulse 67   Ht 5' 8 (  1.727 m)   Wt 208 lb 6.4 oz (94.5 kg)   SpO2 97%   BMI 31.69 kg/m   Physical Examination:  General: Well developed, well nourished, NAD  HEENT: OP clear, mucus membranes moist  SKIN: warm, dry. No rashes. Neuro: No focal deficits  Musculoskeletal: Muscle strength 5/5 all ext  Psychiatric: Mood and affect normal  Neck: No JVD, no  carotid bruits, no thyromegaly, no lymphadenopathy.  Lungs:Clear bilaterally, no wheezes, rhonci, crackles Cardiovascular: Regular rate and rhythm. Harsh systolic murmur.  Abdomen:Soft. Bowel sounds present. Non-tender.  Extremities: 1+ bilateral lower extremity edema. Pulses are 2 + in the bilateral DP/PT.  EKG:  EKG is ordered today. The ekg ordered today demonstrates EKG Interpretation Date/Time:  Wednesday May 09 2024 12:22:41 EDT Ventricular Rate:  52 PR Interval:    QRS Duration:  156 QT Interval:  468 QTC Calculation: 435 R Axis:   9  Text Interpretation: Atrial fibrillation with slow ventricular response Right bundle branch block When compared with ECG of 23-Dec-2023 11:25, No significant change was found Confirmed by Verlin Bruckner [phone]) on 05/09/2024 1:38:42 PM   Echo March 2025: 1. Left ventricular ejection fraction, by estimation, is 60 to 65%. The  left ventricle has normal function. There is mild left ventricular  hypertrophy. Left ventricular diastolic parameters are indeterminate.   2. Right ventricular systolic function is low normal. The right  ventricular size is normal.   3. Left atrial size was mildly dilated.   4. Right atrial size was mildly dilated.   5. Mild to moderate mitral valve regurgitation.   6. AV is thickened, calcified with restricted motion. Diffiuclt to  discern individual leaflets. Peak and mean gradients through the valve are  41 and 25 mm Hg respectively. AVA (by VTI) is 0.58 cm2. DVI is 0.2 Overall  consistent with low flow, severe AS.   (SVI is 26). SABRA Aortic valve regurgitation is not visualized.   7. Aortic dilatation noted. There is mild dilatation of the ascending  aorta, measuring 41 mm.   8. The inferior vena cava is normal in size with greater than 50%  respiratory variability, suggesting right atrial pressure of 3 mmHg.   FINDINGS   Left Ventricle: Left ventricular ejection fraction, by estimation, is 60  to 65%. The  left ventricle has normal function. The left ventricular  internal cavity size was normal in size. There is mild left ventricular  hypertrophy. Left ventricular diastolic   parameters are indeterminate.   Right Ventricle: The right ventricular size is normal. Right vetricular  wall thickness was not assessed. Right ventricular systolic function is  low normal.   Left Atrium: Left atrial size was mildly dilated.   Right Atrium: Right atrial size was mildly dilated.   Pericardium: There is no evidence of pericardial effusion.   Mitral Valve: There is mild thickening of the mitral valve leaflet(s).  There is mild calcification of the mitral valve leaflet(s). Mild mitral  annular calcification. Mild to moderate mitral valve regurgitation.   Tricuspid Valve: The tricuspid valve is normal in structure. Tricuspid  valve regurgitation is mild.   Aortic Valve: AV is thickened, calcified with restricted motion. Diffiuclt  to discern individual leaflets. Peak and mean gradients through the valve  are 41 and 25 mm Hg respectively. AVA (by VTI) is 0.58 cm2. DVI is 0.2  Overall consistent with low flow,  severe AS. (SVI is 26). Aortic valve regurgitation is not visualized.  Aortic valve mean gradient measures 27.0 mmHg. Aortic valve  peak gradient  measures 48.2 mmHg. Aortic valve area, by VTI measures 0.62 cm.   Pulmonic Valve: The pulmonic valve was grossly normal. Pulmonic valve  regurgitation is not visualized.   Aorta: The aortic root is normal in size and structure and aortic  dilatation noted. There is mild dilatation of the ascending aorta,  measuring 41 mm.   Venous: The inferior vena cava is normal in size with greater than 50%  respiratory variability, suggesting right atrial pressure of 3 mmHg.   IAS/Shunts: No atrial level shunt detected by color flow Doppler.     LEFT VENTRICLE  PLAX 2D  LVIDd:         5.00 cm  LVIDs:         2.90 cm  LV PW:         1.00 cm  LV IVS:         1.10 cm  LVOT diam:     2.00 cm  LV SV:         54  LV SV Index:   26  LVOT Area:     3.14 cm     RIGHT VENTRICLE  RV S prime:     9.57 cm/s  TAPSE (M-mode): 1.2 cm  RVSP:           24.9 mmHg   LEFT ATRIUM             Index        RIGHT ATRIUM           Index  LA diam:        5.20 cm 2.52 cm/m   RA Pressure: 3.00 mmHg  LA Vol (A2C):   76.9 ml 37.25 ml/m  RA Area:     19.60 cm  LA Vol (A4C):   60.1 ml 29.11 ml/m  RA Volume:   59.00 ml  28.58 ml/m  LA Biplane Vol: 70.1 ml 33.95 ml/m   AORTIC VALVE  AV Area (Vmax):    0.65 cm  AV Area (Vmean):   0.63 cm  AV Area (VTI):     0.62 cm  AV Vmax:           347.00 cm/s  AV Vmean:          237.000 cm/s  AV VTI:            0.870 m  AV Peak Grad:      48.2 mmHg  AV Mean Grad:      27.0 mmHg  LVOT Vmax:         71.90 cm/s  LVOT Vmean:        47.800 cm/s  LVOT VTI:          0.173 m  LVOT/AV VTI ratio: 0.20    AORTA  Ao Root diam: 3.10 cm  Ao Asc diam:  4.10 cm   MR Peak grad: 117.5 mmHg  TRICUSPID VALVE  MR Mean grad: 70.0 mmHg   TR Peak grad:   21.9 mmHg  MR Vmax:      542.00 cm/s TR Vmax:        234.00 cm/s  MR Vmean:     393.0 cm/s  Estimated RAP:  3.00 mmHg                            RVSP:           24.9 mmHg  SHUNTS                            Systemic VTI:  0.17 m                            Systemic Diam: 2.00 cm    Recent Labs: 06/03/2023: Pro B Natriuretic peptide (BNP) 232.0 01/18/2024: ALT 35; BUN 24; Creatinine, Ser 1.12; Hemoglobin 12.0; Platelets 169.0; Potassium 4.7; Sodium 139; TSH 1.17    Wt Readings from Last 3 Encounters:  05/09/24 208 lb 6.4 oz (94.5 kg)  04/12/24 207 lb 6 oz (94.1 kg)  01/18/24 207 lb 4 oz (94 kg)    Assessment and Plan:   1. Severe Aortic Valve Stenosis: He has severe low flow/low gradient aortic stenosis by echo in March 2025. We have been following his aortic stenosis with serial echocardiograms since he has been asymptomatic. NYHA class 2. At his  last visit, we discussed moving forward with TAVR workup since he has had severe AS for several years. He wanted to delay TAVR workup until now so he could go on a cruise with his family in June 2025. He is now ready to proceed with TAVR workup. Given advanced age, he is not a good candidate for surgical AVR. He may be a candidate for TAVR.     I have reviewed the natural history of aortic stenosis with the patient and their family members  who are present today. We have discussed the limitations of medical therapy and the poor prognosis associated with symptomatic aortic stenosis. We have reviewed potential treatment options, including palliative medical therapy, conventional surgical aortic valve replacement, and transcatheter aortic valve replacement. We discussed treatment options in the context of the patient's specific comorbid medical conditions.   He would like to proceed with planning for TAVR. I will arrange a right and left heart catheterization at [facility] 05/16/24 at 1pm. Risks and benefits of the cath procedure and the valve procedure are reviewed with the patient. After the cath, he will have a cardiac CT, CTA of the chest/abdomen and pelvis and will then be referred to see one of the CT surgeons on our TAVR team.   Baseline RBBB. Increased risk of pacemaker post procedure reviewed with pt and family.   Labs/ tests ordered today include:  Orders Placed This Encounter  Procedures   CBC   Basic metabolic panel with GFR   EKG [id number]-Ozji   Disposition:   F/U with me in 4 months  Signed, Lonni Cash, MD, [facility] 05/09/2024 1:38 PM    [facility] [address] [place], [place], [place]  [address] Phone: [phone]; Fax: [phone]

## 2024-05-09 NOTE — Progress Notes (Addendum)
 Pre Surgical Assessment: 5 M Walk Test  35M=16.39ft  5 Meter Walk Test- trial 1: 10.19 seconds 5 Meter Walk Test- trial 2: 10.98 seconds 5 Meter Walk Test- trial 3: 8.56 seconds 5 Meter Walk Test Average: 9.91 seconds    STS score Procedure Type: Isolated AVR Perioperative Outcome Estimate % Operative Mortality 6.06% Morbidity & Mortality 18% Stroke 3.22% Renal Failure 4.7% Reoperation 3.5% Prolonged Ventilation 6.53% Deep Sternal Wound Infection 0.056% Long Hospital Stay (>14 days) 10.4% Short Hospital Stay (<6 days)* 19.2%

## 2024-05-09 NOTE — Patient Instructions (Addendum)
 Medication Instructions:  No changes *If you need a refill on your cardiac medications before your next appointment, please call your pharmacy*  Lab Work: Today: first floor [employer]:  cbc, bmet   Testing/Procedures: Your physician has requested that you have a cardiac catheterization. Cardiac catheterization is used to diagnose and/or treat various heart conditions. Doctors may recommend this procedure for a number of different reasons. The most common reason is to evaluate chest pain. Chest pain can be a symptom of coronary artery disease (CAD), and cardiac catheterization can show whether plaque is narrowing or blocking your heart's arteries. This procedure is also used to evaluate the valves, as well as measure the blood flow and oxygen levels in different parts of your heart. For further information please visit [email or web address] Please follow instruction sheet, as given.   Follow-Up: Per Structural Heart Team        Cardiac/Peripheral Catheterization   You are scheduled for a Cardiac Catheterization on Wednesday, August 6 with Dr. Lonni Cash.  1. Please arrive at the [facility] (Main Entrance A) at [facility]: [address] [place], [place] [address] at 11:00 AM (This time is TWO hour(s) before your procedure to ensure your preparation).   Free valet parking service is available. You will check in at ADMITTING. The support person will be asked to wait in the waiting room.  It is OK to have someone drop you off and come back when you are ready to be discharged.        Special note: Every effort is made to have your procedure done on time. Please understand that emergencies sometimes delay scheduled procedures.  2. Diet: Light meals may be eaten up to 6 hours before scheduled procedures from 12N and after; please stop eating at 7:00 AM (Light meal consist of plain toast, fruit, light soups, crackers)  3. Hydration: REGULAR: You need to be well hydrated  before your procedure time. You may drink approved liquids (see below) until you leave for the hospital. On the way to the hospital, please drink a 16-oz (1 plastic bottle) of water.      (List of approved liquids water, clear juice, clear tea, black coffee, fruit juices, non-citric and without pulp, carbonated beverages, Gatorade, Kool -Aid, plain Jello-O and plain ice popsicles)  4. Labs: You will need to have blood drawn today.  You do not need to be fasting.  5. Medication instructions in preparation for your procedure:   Contrast Allergy: No  No Coumadin  after tomorrow 05/10/24.  You will restart after catheterization on 8/6 and come to Coumadin  Clinic for INR check on 05/23/24.  No Lasix  day of procedure  No metformin  after Tuesday Aug 5.  Do not restart until Saturday Aug 9.  On the morning of your procedure, take Aspirin 81 mg and any morning medicines NOT listed above.  You may use sips of water.  6. Plan to go home the same day, you will only stay overnight if medically necessary. 7. You MUST have a responsible adult to drive you home. 8. An adult MUST be with you the first 24 hours after you arrive home. 9. Bring a current list of your medications, and the last time and date medication taken. 10. Bring ID and current insurance cards. 11.Please wear clothes that are easy to get on and off and wear slip-on shoes.  Thank you for allowing us  to care for you!   -- [facility]

## 2024-05-09 NOTE — H&P (View-Only) (Signed)
 Structural Heart Clinic Note  Chief Complaint  Patient presents with   Follow-up    Aortic stenosis    History of Present Illness: 89 yo male with history of persistent atrial fibrillation, CAD, DM, hyperlipidemia, HTN, hypothyroidism, RBBB and aortic stenosis who is here today for follow up. I saw him as a new consult in the structural heart clinic in February 2024. He has persistent atrial fibrillation and is on coumadin . He has CAD and had an LAD stent placed in 2006. Echo January 2024 with normal LV systolic function and severe aortic stenosis. He was feeling well when I met him in February 2024 so we elected to follow his aortic stenosis. Repeat echocardiogram in September 2024 and March 2025 with severe low flow, low gradient aortic stenosis but he remained asymptomatic so we elected to continue following his aortic stenosis. Echo 12/19/23 with LVEF=60-65%. Mild to moderate MR. Low flow, low gradient severe AS with mean gradient 25 mmHg, AVA 0.58 cm2, DI 0.2, SVI 26.   He is here today for follow up. He has progressive dyspnea on exertion. The patient denies any chest pain, palpitations, orthopnea, PND, dizziness, near syncope or syncope. He has chronic lower extremity edema.   He lives in [place] alone. He is retired from [employer]. He has dentures and no active dental issues.   Primary Care Physician: [name] BRAVO, MD Primary Cardiologist: Jeffrie Referring Cardiologist: Jeffrie  Past Medical History:  Diagnosis Date   Acute respiratory failure due to COVID-19 [facility]) 07/07/2020   ANEMIA 02/18/2010   Qualifier: Diagnosis of  By: Mahlon MD, Comer     Arthritis 12/2005   left hand   Arthritis    Left knee   ASPARTATE AMINOTRANSFERASE, SERUM, ELEVATED 11/04/2008   Qualifier: Diagnosis of  By: Mahlon MD, Katherine     Atrial fibrillation (HCC) 07/24/2013   Blood transfusion without reported diagnosis    Bursitis of knee    right knee   CAD (coronary artery  disease)    Cardiac arrhythmia due to congenital heart disease    Chronic anticoagulation 06/12/2015   Coagulation defect (HCC) 05/28/2020   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   CORONARY ARTERY DISEASE 03/27/2007   Qualifier: Diagnosis of  By: Tita MD, Luis     Diabetes mellitus    Diverticulosis    Glaucoma    left eye   Hx of colonic polyps 06/12/2015   Hyperlipidemia    Hypertension    Hypothyroid 09/21/2018   Long term current use of anticoagulant therapy 07/24/2013   Neuropathy    with pain   Pain due to onychomycosis of toenail of left foot 05/28/2020   Permanent atrial fibrillation (HCC)    RBBB    Scrotal mass 03/07/2012   Spinal stenosis 01/20/2005   Tubular adenoma of colon     Past Surgical History:  Procedure Laterality Date   BACK SURGERY     BIOPSY  09/16/2022   Procedure: BIOPSY;  Surgeon: [name], MD;  Location: [facility];  Service: Gastroenterology;;   CATARACT EXTRACTION  08/11/2009   right   COLONOSCOPY WITH PROPOFOL  N/A 09/16/2022   Procedure: COLONOSCOPY WITH PROPOFOL ;  Surgeon: [name], MD;  Location: [facility];  Service: Gastroenterology;  Laterality: N/A;   corneal endothelial Left 2013   CORNEAL TRANSPLANT Left    eye   CORONARY ANGIOPLASTY WITH STENT PLACEMENT     ESOPHAGOGASTRODUODENOSCOPY (EGD) WITH PROPOFOL  N/A 09/16/2022  Procedure: ESOPHAGOGASTRODUODENOSCOPY (EGD) WITH PROPOFOL ;  Surgeon: [name], MD;  Location: [facility];  Service: Gastroenterology;  Laterality: N/A;   EYE SURGERY     HOT HEMOSTASIS N/A 09/16/2022   Procedure: HOT HEMOSTASIS (ARGON PLASMA COAGULATION/BICAP);  Surgeon: [name], MD;  Location: [facility];  Service: Gastroenterology;  Laterality: N/A;   POLYPECTOMY  09/16/2022   Procedure: POLYPECTOMY;  Surgeon: [name], MD;  Location: [facility];  Service: Gastroenterology;;   ROTATOR CUFF REPAIR     SMALL INTESTINE SURGERY      SPINE SURGERY      Current Outpatient Medications  Medication Sig Dispense Refill   allopurinol  (ZYLOPRIM ) 100 MG tablet Take 1 tablet (100 mg total) by mouth daily. 90 tablet 1   amLODipine  (NORVASC ) 2.5 MG tablet Take 1 tablet by mouth once daily 90 tablet 2   atorvastatin  (LIPITOR) 40 MG tablet TAKE 1 TABLET BY MOUTH AT BEDTIME 90 tablet 0   BESIVANCE 0.6 % SUSP Place 1 drop into the left eye 4 (four) times daily. Pt use drop after each monthly eye injection.     Calcium  Carbonate-Vitamin D (CALCIUM  600 + D PO) Take 1 tablet by mouth 2 (two) times daily.     diclofenac  Sodium (VOLTAREN ) 1 % GEL Apply 2 g topically 4 (four) times daily. 100 g 1   ferrous sulfate 325 (65 FE) MG tablet Take 325 mg by mouth daily.     fluorometholone  (FML) 0.1 % ophthalmic suspension Place 1 drop into both eyes every 30 (thirty) days.      furosemide  (LASIX ) 40 MG tablet Take 1 tablet by mouth once daily 90 tablet 0   Lancets (ONETOUCH ULTRASOFT) lancets Use as instructed 100 each 12   latanoprost  (XALATAN ) 0.005 % ophthalmic solution SMARTSIG:In Eye(s)     levothyroxine  (SYNTHROID ) 75 MCG tablet Take 1 tablet (75 mcg total) by mouth daily. 90 tablet 0   magnesium  oxide (MAGOX 400) 400 (241.3 Mg) MG tablet Take 1 tablet (400 mg total) by mouth 2 (two) times daily. 60 tablet 11   metFORMIN  (GLUCOPHAGE ) 1000 MG tablet Take 1 tablet by mouth twice daily 180 tablet 0   metoprolol  tartrate (LOPRESSOR ) 100 MG tablet Take 1 tablet by mouth twice daily 180 tablet 0   Multiple Vitamin (MULTIVITAMIN) capsule Take 1 capsule by mouth daily.     Omega-3 Fatty Acids (FISH OIL) 1200 MG CAPS Take 3 capsules by mouth daily.     ONETOUCH VERIO test strip USE 1 STRIP TO CHECK GLUCOSE TWICE DAILY AS DIRECTED 100 each 0   tamsulosin  (FLOMAX ) 0.4 MG CAPS capsule Take 1 capsule (0.4 mg total) by mouth daily. 90 capsule 1   timolol  (TIMOPTIC ) 0.5 % ophthalmic solution Place 1 drop into both eyes daily.      tolterodine  (DETROL ) 1  MG tablet Take 1 tablet (1 mg total) by mouth 2 (two) times daily. 60 tablet 3   warfarin (COUMADIN ) 2.5 MG tablet TAKE 1/2 TO 1 (ONE-HALF TO ONE) TABLET BY MOUTH ONCE DAILY OR  AS  DIRECTED  BY  ANTICOAGULATION  CLINIC 100 tablet 1   cetirizine  (ZYRTEC ) 10 MG tablet Take 1 tablet (10 mg total) by mouth daily. (Patient not taking: Reported on 05/09/2024) 90 tablet 1   omeprazole  (PRILOSEC) 40 MG capsule Take 1 capsule (40 mg total) by mouth 2 (two) times daily for 14 days. (Patient not taking: Reported on 05/09/2024) 28 capsule 0   No current facility-administered medications for this visit.  Allergies  Allergen Reactions   Neomycin-Bacitracin Zn-Polymyx Rash   Penicillins Other (See Comments) and Rash    Pt states this was diagnosed years ago, unsure as to reaction. Will not use.    Enalapril      Stopped 11/2020 due to hyperkalemia due to this   Miconazole Nitrate     Unsure of reaction    Sulfa Antibiotics     Pt not aware of allergy     Social History   Socioeconomic History   Marital status: Divorced    Spouse name: Not on file   Number of children: 5   Years of education: Not on file   Highest education level: 12th grade  Occupational History   Occupation: retired   Occupation: retired   Occupation: Retired from [employer]  Tobacco Use   Smoking status: Never   Smokeless tobacco: Never  Vaping Use   Vaping status: Never Used  Substance and Sexual Activity   Alcohol use: Not Currently    Comment: Very seldom---socially   Drug use: No   Sexual activity: Not Currently    Partners: Female  Other Topics Concern   Not on file  Social History Narrative   Volunteers at [employer]         Social Drivers of Health   Financial Resource Strain: Low Risk  (01/17/2024)   Overall Financial Resource Strain (CARDIA)    Difficulty of Paying Living Expenses: Not hard at all  Food Insecurity: No Food Insecurity (01/17/2024)   Hunger Vital Sign    Worried About Running Out of  Food in the Last Year: Never true    Ran Out of Food in the Last Year: Never true  Transportation Needs: No Transportation Needs (01/17/2024)   PRAPARE - Administrator, Civil Service (Medical): No    Lack of Transportation (Non-Medical): No  Physical Activity: Inactive (01/17/2024)   Exercise Vital Sign    Days of Exercise per Week: 0 days    Minutes of Exercise per Session: 0 min  Stress: No Stress Concern Present (01/17/2024)   [employer] of Occupational Health - Occupational Stress Questionnaire    Feeling of Stress : Not at all  Social Connections: Moderately Isolated (01/17/2024)   Social Connection and Isolation Panel    Frequency of Communication with Friends and Family: More than three times a week    Frequency of Social Gatherings with Friends and Family: Three times a week    Attends Religious Services: Never    Active Member of Clubs or Organizations: No    Attends Engineer, structural: More than 4 times per year    Marital Status: Divorced  Intimate Partner Violence: Not At Risk (05/11/2023)   Humiliation, Afraid, Rape, and Kick questionnaire    Fear of Current or Ex-Partner: No    Emotionally Abused: No    Physically Abused: No    Sexually Abused: No    Family History  Problem Relation Age of Onset   Heart failure Mother        Heart attack age 51   Heart failure Father        Heart attack age 73   Stomach cancer Neg Hx    Colon cancer Neg Hx    Pancreatic cancer Neg Hx    Throat cancer Neg Hx     Review of Systems:  As stated in the HPI and otherwise negative.   BP 104/60   Pulse 67   Ht 5' 8 (  1.727 m)   Wt 208 lb 6.4 oz (94.5 kg)   SpO2 97%   BMI 31.69 kg/m   Physical Examination:  General: Well developed, well nourished, NAD  HEENT: OP clear, mucus membranes moist  SKIN: warm, dry. No rashes. Neuro: No focal deficits  Musculoskeletal: Muscle strength 5/5 all ext  Psychiatric: Mood and affect normal  Neck: No JVD, no  carotid bruits, no thyromegaly, no lymphadenopathy.  Lungs:Clear bilaterally, no wheezes, rhonci, crackles Cardiovascular: Regular rate and rhythm. Harsh systolic murmur.  Abdomen:Soft. Bowel sounds present. Non-tender.  Extremities: 1+ bilateral lower extremity edema. Pulses are 2 + in the bilateral DP/PT.  EKG:  EKG is ordered today. The ekg ordered today demonstrates EKG Interpretation Date/Time:  Wednesday May 09 2024 12:22:41 EDT Ventricular Rate:  52 PR Interval:    QRS Duration:  156 QT Interval:  468 QTC Calculation: 435 R Axis:   9  Text Interpretation: Atrial fibrillation with slow ventricular response Right bundle branch block When compared with ECG of 23-Dec-2023 11:25, No significant change was found Confirmed by Verlin Bruckner [phone]) on 05/09/2024 1:38:42 PM   Echo March 2025: 1. Left ventricular ejection fraction, by estimation, is 60 to 65%. The  left ventricle has normal function. There is mild left ventricular  hypertrophy. Left ventricular diastolic parameters are indeterminate.   2. Right ventricular systolic function is low normal. The right  ventricular size is normal.   3. Left atrial size was mildly dilated.   4. Right atrial size was mildly dilated.   5. Mild to moderate mitral valve regurgitation.   6. AV is thickened, calcified with restricted motion. Diffiuclt to  discern individual leaflets. Peak and mean gradients through the valve are  41 and 25 mm Hg respectively. AVA (by VTI) is 0.58 cm2. DVI is 0.2 Overall  consistent with low flow, severe AS.   (SVI is 26). SABRA Aortic valve regurgitation is not visualized.   7. Aortic dilatation noted. There is mild dilatation of the ascending  aorta, measuring 41 mm.   8. The inferior vena cava is normal in size with greater than 50%  respiratory variability, suggesting right atrial pressure of 3 mmHg.   FINDINGS   Left Ventricle: Left ventricular ejection fraction, by estimation, is 60  to 65%. The  left ventricle has normal function. The left ventricular  internal cavity size was normal in size. There is mild left ventricular  hypertrophy. Left ventricular diastolic   parameters are indeterminate.   Right Ventricle: The right ventricular size is normal. Right vetricular  wall thickness was not assessed. Right ventricular systolic function is  low normal.   Left Atrium: Left atrial size was mildly dilated.   Right Atrium: Right atrial size was mildly dilated.   Pericardium: There is no evidence of pericardial effusion.   Mitral Valve: There is mild thickening of the mitral valve leaflet(s).  There is mild calcification of the mitral valve leaflet(s). Mild mitral  annular calcification. Mild to moderate mitral valve regurgitation.   Tricuspid Valve: The tricuspid valve is normal in structure. Tricuspid  valve regurgitation is mild.   Aortic Valve: AV is thickened, calcified with restricted motion. Diffiuclt  to discern individual leaflets. Peak and mean gradients through the valve  are 41 and 25 mm Hg respectively. AVA (by VTI) is 0.58 cm2. DVI is 0.2  Overall consistent with low flow,  severe AS. (SVI is 26). Aortic valve regurgitation is not visualized.  Aortic valve mean gradient measures 27.0 mmHg. Aortic valve  peak gradient  measures 48.2 mmHg. Aortic valve area, by VTI measures 0.62 cm.   Pulmonic Valve: The pulmonic valve was grossly normal. Pulmonic valve  regurgitation is not visualized.   Aorta: The aortic root is normal in size and structure and aortic  dilatation noted. There is mild dilatation of the ascending aorta,  measuring 41 mm.   Venous: The inferior vena cava is normal in size with greater than 50%  respiratory variability, suggesting right atrial pressure of 3 mmHg.   IAS/Shunts: No atrial level shunt detected by color flow Doppler.     LEFT VENTRICLE  PLAX 2D  LVIDd:         5.00 cm  LVIDs:         2.90 cm  LV PW:         1.00 cm  LV IVS:         1.10 cm  LVOT diam:     2.00 cm  LV SV:         54  LV SV Index:   26  LVOT Area:     3.14 cm     RIGHT VENTRICLE  RV S prime:     9.57 cm/s  TAPSE (M-mode): 1.2 cm  RVSP:           24.9 mmHg   LEFT ATRIUM             Index        RIGHT ATRIUM           Index  LA diam:        5.20 cm 2.52 cm/m   RA Pressure: 3.00 mmHg  LA Vol (A2C):   76.9 ml 37.25 ml/m  RA Area:     19.60 cm  LA Vol (A4C):   60.1 ml 29.11 ml/m  RA Volume:   59.00 ml  28.58 ml/m  LA Biplane Vol: 70.1 ml 33.95 ml/m   AORTIC VALVE  AV Area (Vmax):    0.65 cm  AV Area (Vmean):   0.63 cm  AV Area (VTI):     0.62 cm  AV Vmax:           347.00 cm/s  AV Vmean:          237.000 cm/s  AV VTI:            0.870 m  AV Peak Grad:      48.2 mmHg  AV Mean Grad:      27.0 mmHg  LVOT Vmax:         71.90 cm/s  LVOT Vmean:        47.800 cm/s  LVOT VTI:          0.173 m  LVOT/AV VTI ratio: 0.20    AORTA  Ao Root diam: 3.10 cm  Ao Asc diam:  4.10 cm   MR Peak grad: 117.5 mmHg  TRICUSPID VALVE  MR Mean grad: 70.0 mmHg   TR Peak grad:   21.9 mmHg  MR Vmax:      542.00 cm/s TR Vmax:        234.00 cm/s  MR Vmean:     393.0 cm/s  Estimated RAP:  3.00 mmHg                            RVSP:           24.9 mmHg  SHUNTS                            Systemic VTI:  0.17 m                            Systemic Diam: 2.00 cm    Recent Labs: 06/03/2023: Pro B Natriuretic peptide (BNP) 232.0 01/18/2024: ALT 35; BUN 24; Creatinine, Ser 1.12; Hemoglobin 12.0; Platelets 169.0; Potassium 4.7; Sodium 139; TSH 1.17    Wt Readings from Last 3 Encounters:  05/09/24 208 lb 6.4 oz (94.5 kg)  04/12/24 207 lb 6 oz (94.1 kg)  01/18/24 207 lb 4 oz (94 kg)    Assessment and Plan:   1. Severe Aortic Valve Stenosis: He has severe low flow/low gradient aortic stenosis by echo in March 2025. We have been following his aortic stenosis with serial echocardiograms since he has been asymptomatic. NYHA class 2. At his  last visit, we discussed moving forward with TAVR workup since he has had severe AS for several years. He wanted to delay TAVR workup until now so he could go on a cruise with his family in June 2025. He is now ready to proceed with TAVR workup. Given advanced age, he is not a good candidate for surgical AVR. He may be a candidate for TAVR.     I have reviewed the natural history of aortic stenosis with the patient and their family members  who are present today. We have discussed the limitations of medical therapy and the poor prognosis associated with symptomatic aortic stenosis. We have reviewed potential treatment options, including palliative medical therapy, conventional surgical aortic valve replacement, and transcatheter aortic valve replacement. We discussed treatment options in the context of the patient's specific comorbid medical conditions.   He would like to proceed with planning for TAVR. I will arrange a right and left heart catheterization at [facility] 05/16/24 at 1pm. Risks and benefits of the cath procedure and the valve procedure are reviewed with the patient. After the cath, he will have a cardiac CT, CTA of the chest/abdomen and pelvis and will then be referred to see one of the CT surgeons on our TAVR team.   Baseline RBBB. Increased risk of pacemaker post procedure reviewed with pt and family.   Labs/ tests ordered today include:  Orders Placed This Encounter  Procedures   CBC   Basic metabolic panel with GFR   EKG [id number]   Disposition:   F/U with me in 4 months  Signed, Lonni Cash, MD, [facility] 05/09/2024 1:38 PM    [facility] [address] [place], [place], [place]  [address] Phone: [phone]; Fax: [phone]

## 2024-05-10 ENCOUNTER — Ambulatory Visit: Payer: Self-pay | Admitting: Cardiovascular Disease

## 2024-05-10 LAB — BASIC METABOLIC PANEL WITH GFR
BUN/Creatinine Ratio: 20 (ref 10–24)
BUN: 25 mg/dL (ref 8–27)
CO2: 23 mmol/L (ref 20–29)
Calcium: 9.2 mg/dL (ref 8.6–10.2)
Chloride: 98 mmol/L (ref 96–106)
Creatinine, Ser: 1.24 mg/dL (ref 0.76–1.27)
Glucose: 121 mg/dL — AB (ref 70–99)
Potassium: 4.4 mmol/L (ref 3.5–5.2)
Sodium: 138 mmol/L (ref 134–144)
eGFR: 56 mL/min/1.73 — AB (ref >59)

## 2024-05-10 LAB — CBC
Hematocrit: 36.3 % — ABNORMAL LOW (ref 37.5–51.0)
Hemoglobin: 11.8 g/dL — ABNORMAL LOW (ref 13.0–17.7)
MCH: 32.2 pg (ref 26.6–33.0)
MCHC: 32.5 g/dL (ref 31.5–35.7)
MCV: 99 fL — ABNORMAL HIGH (ref 79–97)
Platelets: 138 x10E3/uL — ABNORMAL LOW (ref 150–450)
RBC: 3.66 x10E6/uL — ABNORMAL LOW (ref 4.14–5.80)
RDW: 14.6 % (ref 11.6–15.4)
WBC: 6.5 x10E3/uL (ref 3.4–10.8)

## 2024-05-15 ENCOUNTER — Telehealth: Payer: Self-pay | Admitting: *Deleted

## 2024-05-15 NOTE — Telephone Encounter (Addendum)
 Cardiac Catheterization scheduled at [facility] for: Wednesday May 16, 2024 1 PM Arrival time [facility] Main Entrance A at: 11 AM  Diet: -May have light meal until 7AM. (6 hours before procedure time) Approved light meal consists of plain toast, fruit, light soups, crackers.  Hydration: -May drink clear liquids until leaving for hospital. Approved liquids: Water , clear tea, black coffee, fruit juices-non-citric and without pulp,Gatorade, plain Jello/popsicles.  Drink 16 oz. bottle of water  on the way to the hospital.   Medication instructions: -Hold:  Warfarin-none 05/11/24 until post procedure  Metformin -day of procedure and 48 hours post procedure  Lasix - day of procedure-per protocol GFR < 60 (56) -Other usual morning medications can be taken including aspirin  81 mg.  Plan to go home the same day, you will only stay overnight if medically necessary.  You must have responsible adult to drive you home.  Someone must be with you the first 24 hours after you arrive home.  Reviewed procedure instructions with patient's daughter (CPR), Zebedee.

## 2024-05-16 ENCOUNTER — Ambulatory Visit (HOSPITAL_COMMUNITY)
Admission: RE | Admit: 2024-05-16 | Discharge: 2024-05-16 | Disposition: A | Discharge status: Home / Self Care | Attending: Cardiovascular Disease | Admitting: Cardiovascular Disease

## 2024-05-16 ENCOUNTER — Other Ambulatory Visit: Payer: Self-pay

## 2024-05-16 ENCOUNTER — Encounter (HOSPITAL_COMMUNITY)
Admission: RE | Disposition: A | Discharge status: Home / Self Care | Source: Home / Self Care | Attending: Cardiovascular Disease

## 2024-05-16 ENCOUNTER — Encounter (INDEPENDENT_AMBULATORY_CARE_PROVIDER_SITE_OTHER): Admitting: Ophthalmology

## 2024-05-16 DIAGNOSIS — I10 Essential (primary) hypertension: Secondary | ICD-10-CM | POA: Insufficient documentation

## 2024-05-16 DIAGNOSIS — I4510 Unspecified right bundle-branch block: Secondary | ICD-10-CM | POA: Diagnosis not present

## 2024-05-16 DIAGNOSIS — Z7984 Long term (current) use of oral hypoglycemic drugs: Secondary | ICD-10-CM | POA: Diagnosis not present

## 2024-05-16 DIAGNOSIS — I2510 Atherosclerotic heart disease of native coronary artery without angina pectoris: Secondary | ICD-10-CM | POA: Insufficient documentation

## 2024-05-16 DIAGNOSIS — E039 Hypothyroidism, unspecified: Secondary | ICD-10-CM | POA: Insufficient documentation

## 2024-05-16 DIAGNOSIS — I350 Nonrheumatic aortic (valve) stenosis: Secondary | ICD-10-CM

## 2024-05-16 DIAGNOSIS — I2584 Coronary atherosclerosis due to calcified coronary lesion: Secondary | ICD-10-CM | POA: Diagnosis not present

## 2024-05-16 DIAGNOSIS — I4821 Permanent atrial fibrillation: Secondary | ICD-10-CM | POA: Insufficient documentation

## 2024-05-16 DIAGNOSIS — Z79899 Other long term (current) drug therapy: Secondary | ICD-10-CM | POA: Insufficient documentation

## 2024-05-16 DIAGNOSIS — Z7901 Long term (current) use of anticoagulants: Secondary | ICD-10-CM | POA: Diagnosis not present

## 2024-05-16 DIAGNOSIS — I083 Combined rheumatic disorders of mitral, aortic and tricuspid valves: Secondary | ICD-10-CM | POA: Diagnosis not present

## 2024-05-16 DIAGNOSIS — E1140 Type 2 diabetes mellitus with diabetic neuropathy, unspecified: Secondary | ICD-10-CM | POA: Insufficient documentation

## 2024-05-16 DIAGNOSIS — E785 Hyperlipidemia, unspecified: Secondary | ICD-10-CM | POA: Insufficient documentation

## 2024-05-16 HISTORY — PX: CORONARY ANGIOGRAPHY: CATH118303

## 2024-05-16 LAB — GLUCOSE, CAPILLARY
Glucose-Capillary: 116 mg/dL — ABNORMAL HIGH (ref 70–99)
Glucose-Capillary: 111 mg/dL — ABNORMAL HIGH (ref 70–99)

## 2024-05-16 LAB — PROTIME-INR
INR: 1.3 — ABNORMAL HIGH (ref 0.8–1.2)
Prothrombin Time: 17.2 s — ABNORMAL HIGH (ref 11.4–15.2)

## 2024-05-16 SURGERY — CORONARY ANGIOGRAPHY (CATH LAB)
Anesthesia: LOCAL

## 2024-05-16 MED ORDER — FENTANYL CITRATE (PF) 100 MCG/2ML IJ SOLN
INTRAMUSCULAR | Status: DC | PRN
  Administered 2024-05-16: 25 ug via INTRAVENOUS

## 2024-05-16 MED ORDER — ASPIRIN 81 MG PO CHEW: 81.0000 mg | CHEWABLE_TABLET | ORAL | Status: DC

## 2024-05-16 MED ORDER — HEPARIN (PORCINE) IN NACL 1000-0.9 UT/500ML-% IV SOLN
INTRAVENOUS | Status: DC | PRN
  Administered 2024-05-16 (×2): 500 mL

## 2024-05-16 MED ORDER — SODIUM CHLORIDE 0.9 % IV SOLN
250.0000 mL | INTRAVENOUS | Status: DC | PRN
Start: 2024-05-16 — End: 2024-05-16

## 2024-05-16 MED ORDER — FENTANYL CITRATE (PF) 100 MCG/2ML IJ SOLN
INTRAMUSCULAR | Status: AC
Start: 2024-05-16 — End: 2024-05-16
  Filled 2024-05-16: qty 2

## 2024-05-16 MED ORDER — FREE WATER: 500.0000 mL | Freq: Once | Status: DC

## 2024-05-16 MED ORDER — VERAPAMIL HCL 2.5 MG/ML IV SOLN
INTRAVENOUS | Status: AC
Start: 2024-05-16 — End: 2024-05-16
  Filled 2024-05-16: qty 2

## 2024-05-16 MED ORDER — SODIUM CHLORIDE 0.9% FLUSH: 3.0000 mL | INTRAVENOUS | Status: DC | PRN

## 2024-05-16 MED ORDER — SODIUM CHLORIDE 0.9% FLUSH: 3.0000 mL | Freq: Two times a day (BID) | INTRAVENOUS | Status: DC

## 2024-05-16 MED ORDER — HYDRALAZINE HCL 20 MG/ML IJ SOLN: 10.0000 mg | INTRAMUSCULAR | Status: DC | PRN

## 2024-05-16 MED ORDER — LABETALOL HCL 5 MG/ML IV SOLN: 10.0000 mg | INTRAVENOUS | Status: DC | PRN

## 2024-05-16 MED ORDER — MIDAZOLAM HCL 2 MG/2ML IJ SOLN
INTRAMUSCULAR | Status: AC
Start: 2024-05-16 — End: 2024-05-16
  Filled 2024-05-16: qty 2

## 2024-05-16 MED ORDER — SODIUM CHLORIDE 0.9 % IV SOLN: 250.0000 mL | INTRAVENOUS | Status: DC | PRN

## 2024-05-16 MED ORDER — LIDOCAINE HCL (PF) 1 % IJ SOLN
INTRAMUSCULAR | Status: AC
Start: 2024-05-16 — End: 2024-05-16
  Filled 2024-05-16: qty 30

## 2024-05-16 MED ORDER — LIDOCAINE HCL (PF) 1 % IJ SOLN
INTRAMUSCULAR | Status: DC | PRN
  Administered 2024-05-16: 5 mL via INTRADERMAL
  Administered 2024-05-16: 2 mL via INTRADERMAL

## 2024-05-16 MED ORDER — IOHEXOL 350 MG/ML SOLN
INTRAVENOUS | Status: DC | PRN
  Administered 2024-05-16: 25 mL via INTRACARDIAC

## 2024-05-16 MED ORDER — ONDANSETRON HCL 4 MG/2ML IJ SOLN: 4.0000 mg | Freq: Four times a day (QID) | INTRAMUSCULAR | Status: DC | PRN

## 2024-05-16 MED ORDER — MIDAZOLAM HCL 2 MG/2ML IJ SOLN
INTRAMUSCULAR | Status: DC | PRN
  Administered 2024-05-16: 1 mg via INTRAVENOUS

## 2024-05-16 MED ORDER — ACETAMINOPHEN 325 MG PO TABS: 650.0000 mg | ORAL_TABLET | ORAL | Status: DC | PRN

## 2024-05-16 SURGICAL SUPPLY — 9 items
CATH INFINITI 5FR MULTPACK ANG (CATHETERS) IMPLANT
CLOSURE MYNX CONTROL 5F (Vascular Products) IMPLANT
GLIDESHEATH SLEND SS 6F .021 (SHEATH) IMPLANT
GUIDEWIRE INQWIRE 1.5J.035X260 (WIRE) IMPLANT
KIT MICROPUNCTURE NIT STIFF (SHEATH) IMPLANT
PACK CARDIAC CATHETERIZATION (CUSTOM PROCEDURE TRAY) ×2 IMPLANT
SET ATX-X65L (MISCELLANEOUS) IMPLANT
SHEATH PINNACLE 5F 10CM (SHEATH) IMPLANT
SHEATH PROBE COVER 6X72 (BAG) IMPLANT

## 2024-05-16 NOTE — Interval H&P Note (Signed)
 History and Physical Interval Note:  05/16/2024 12:07 PM  Marvin Foster  has presented today for surgery, with the diagnosis of aortic stenosis.  The various methods of treatment have been discussed with the patient and family. After consideration of risks, benefits and other options for treatment, the patient has consented to  Procedure(s): CORONARY ANGIOGRAPHY (N/A) as a surgical intervention.  The patient's history has been reviewed, patient examined, no change in status, stable for surgery.  I have reviewed the patient's chart and labs.  Questions were answered to the patient's satisfaction.    Cath Lab Visit (complete for each Cath Lab visit)  Clinical Evaluation Leading to the Procedure:   ACS: No.  Non-ACS:    Anginal Classification: CCS II  Anti-ischemic medical therapy: Maximal Therapy (2 or more classes of medications)  Non-Invasive Test Results: No non-invasive testing performed  Prior CABG: No previous CABG    Marvin Foster

## 2024-05-16 NOTE — Progress Notes (Signed)
 Discharge instructions reviewed with patient and son at bedside, denies questions or concerns. Pt ambulated in the hallway to the bathroom was able to void clear yeallow urnine without difficulty. Assisted patient with getting dressed, pt then escorted off the unit via wheelchair, to personal vehicle driven by son.

## 2024-05-16 NOTE — Discharge Instructions (Addendum)
 Resume coumadin  tonight.   Femoral Site Care This sheet gives you information about how to care for yourself after your procedure. Your health care provider may also give you more specific instructions. If you have problems or questions, contact your health care provider. What can I expect after the procedure?  After the procedure, it is common to have: Bruising that usually fades within 1-2 weeks. Tenderness at the site. Follow these instructions at home: Wound care Follow instructions from your health care provider about how to take care of your insertion site. Make sure you: Wash your hands with soap and water  before you change your bandage (dressing). If soap and water  are not available, use hand sanitizer. Remove your dressing as told by your health care provider. 24 hours Do not take baths, swim, or use a hot tub until your health care provider approves. You may shower 24-48 hours after the procedure or as told by your health care provider. Gently wash the site with plain soap and water . Pat the area dry with a clean towel. Do not rub the site. This may cause bleeding. Do not apply powder or lotion to the site. Keep the site clean and dry. Check your femoral site every day for signs of infection. Check for: Redness, swelling, or pain. Fluid or blood. Warmth. Pus or a bad smell. Activity For the first 2-3 days after your procedure, or as long as directed: Avoid climbing stairs as much as possible. Do not squat. Do not lift anything that is heavier than 10 lb (4.5 kg), or the limit that you are told, until your health care provider says that it is safe. For 5 days Rest as directed. Avoid sitting for a long time without moving. Get up to take short walks every 1-2 hours. Do not drive for 24 hours if you were given a medicine to help you relax (sedative). General instructions Take over-the-counter and prescription medicines only as told by your health care provider. Keep all  follow-up visits as told by your health care provider. This is important. Contact a health care provider if you have: A fever or chills. You have redness, swelling, or pain around your insertion site. Get help right away if: The catheter insertion area swells very fast. You pass out. You suddenly start to sweat or your skin gets clammy. The catheter insertion area is bleeding, and the bleeding does not stop when you hold steady pressure on the area. The area near or just beyond the catheter insertion site becomes pale, cool, tingly, or numb. These symptoms may represent a serious problem that is an emergency. Do not wait to see if the symptoms will go away. Get medical help right away. Call your local emergency services (911 in the [place] Do not drive yourself to the hospital. Summary After the procedure, it is common to have bruising that usually fades within 1-2 weeks. Check your femoral site every day for signs of infection. Do not lift anything that is heavier than 10 lb (4.5 kg), or the limit that you are told, until your health care provider says that it is safe. This information is not intended to replace advice given to you by your health care provider. Make sure you discuss any questions you have with your health care provider. Document Revised: 10/10/2017 Document Reviewed: 10/10/2017 Elsevier Patient Education  2020 [employer].

## 2024-05-17 ENCOUNTER — Encounter (HOSPITAL_COMMUNITY): Payer: Self-pay | Admitting: Cardiovascular Disease

## 2024-05-18 ENCOUNTER — Encounter (INDEPENDENT_AMBULATORY_CARE_PROVIDER_SITE_OTHER): Admitting: Ophthalmology

## 2024-05-18 DIAGNOSIS — H35033 Hypertensive retinopathy, bilateral: Secondary | ICD-10-CM

## 2024-05-18 DIAGNOSIS — H353112 Nonexudative age-related macular degeneration, right eye, intermediate dry stage: Secondary | ICD-10-CM

## 2024-05-18 DIAGNOSIS — H353221 Exudative age-related macular degeneration, left eye, with active choroidal neovascularization: Secondary | ICD-10-CM | POA: Diagnosis not present

## 2024-05-18 DIAGNOSIS — I10 Essential (primary) hypertension: Secondary | ICD-10-CM | POA: Diagnosis not present

## 2024-05-18 DIAGNOSIS — H43813 Vitreous degeneration, bilateral: Secondary | ICD-10-CM

## 2024-05-22 ENCOUNTER — Telehealth

## 2024-05-23 ENCOUNTER — Ambulatory Visit (HOSPITAL_COMMUNITY)
Admission: RE | Admit: 2024-05-23 | Discharge: 2024-05-23 | Disposition: A | Discharge status: Home / Self Care | Source: Ambulatory Visit | Attending: Cardiology | Admitting: Cardiology

## 2024-05-23 ENCOUNTER — Ambulatory Visit

## 2024-05-23 DIAGNOSIS — I700 Atherosclerosis of aorta: Secondary | ICD-10-CM | POA: Insufficient documentation

## 2024-05-23 DIAGNOSIS — I2510 Atherosclerotic heart disease of native coronary artery without angina pectoris: Secondary | ICD-10-CM | POA: Diagnosis not present

## 2024-05-23 DIAGNOSIS — I4891 Unspecified atrial fibrillation: Secondary | ICD-10-CM

## 2024-05-23 DIAGNOSIS — R590 Localized enlarged lymph nodes: Secondary | ICD-10-CM | POA: Diagnosis not present

## 2024-05-23 DIAGNOSIS — Z5181 Encounter for therapeutic drug level monitoring: Secondary | ICD-10-CM

## 2024-05-23 DIAGNOSIS — I517 Cardiomegaly: Secondary | ICD-10-CM | POA: Diagnosis not present

## 2024-05-23 DIAGNOSIS — K8020 Calculus of gallbladder without cholecystitis without obstruction: Secondary | ICD-10-CM | POA: Diagnosis not present

## 2024-05-23 DIAGNOSIS — I350 Nonrheumatic aortic (valve) stenosis: Secondary | ICD-10-CM | POA: Diagnosis not present

## 2024-05-23 DIAGNOSIS — Z01818 Encounter for other preprocedural examination: Secondary | ICD-10-CM | POA: Insufficient documentation

## 2024-05-23 DIAGNOSIS — Z48812 Encounter for surgical aftercare following surgery on the circulatory system: Secondary | ICD-10-CM | POA: Diagnosis not present

## 2024-05-23 LAB — POCT INR: INR: 1.8 — AB (ref 2.0–3.0)

## 2024-05-23 MED ORDER — IOHEXOL 350 MG/ML SOLN
100.0000 mL | Freq: Once | INTRAVENOUS | Status: AC | PRN
  Administered 2024-05-23 (×2): 100 mL via INTRAVENOUS

## 2024-05-23 MED ORDER — IOHEXOL 350 MG/ML SOLN: 100.0000 mL | Freq: Once | INTRAVENOUS | Status: DC | PRN

## 2024-05-23 NOTE — Progress Notes (Signed)
 INR 1.8. Please see anticoagulation encounter

## 2024-05-23 NOTE — Patient Instructions (Addendum)
 Description   *DOES NOT NEED A PRINTOUT* Take 2 tablets today and then continue taking 1 tablet daily except for 1/2 tablet on  Saturdays.   Stay consistent with greens each week (3 per week).  Repeat INR in 1 week.  Call us  with any medication changes or concerns # [phone] Coumadin  Clinic.   Allopurinol  Daily started 1/3

## 2024-05-25 ENCOUNTER — Ambulatory Visit (INDEPENDENT_AMBULATORY_CARE_PROVIDER_SITE_OTHER): Admitting: Family Medicine

## 2024-05-25 ENCOUNTER — Encounter: Payer: Self-pay | Admitting: Family Medicine

## 2024-05-25 VITALS — BP 106/60 | HR 86 | Temp 98.7°F | Ht 69.0 in | Wt 206.6 lb

## 2024-05-25 DIAGNOSIS — R1310 Dysphagia, unspecified: Secondary | ICD-10-CM

## 2024-05-25 NOTE — Patient Instructions (Signed)
 Follow up after surgery to recheck memory We'll call you to schedule your swallow study Try and take smaller bites and less pills Chew thoroughly and drink plenty of water  Call with any questions or concerns Stay Safe!  Stay Healthy! GOOD LUCK!!!

## 2024-05-25 NOTE — Progress Notes (Signed)
   Subjective:    Patient ID: Marvin Foster, male    DOB: 15-Aug-1934, 89 y.o.   MRN: [id number]  HPI Dysphagia-  'it's not bad.  I probably need to take smaller bites'.  Denies pain w/ swallowing.  Denies SOB.  At time does bring food back up into mouth for additional chewing.  Sxs are episodic.  Family reports he first mentioned this a few weeks ago.     Review of Systems For ROS see HPI     Objective:   Physical Exam Vitals reviewed.  Constitutional:      General: He is not in acute distress.    Appearance: Normal appearance. He is not ill-appearing.  HENT:     Head: Normocephalic and atraumatic.  Skin:    General: Skin is warm and dry.  Neurological:     General: No focal deficit present.     Mental Status: He is alert and oriented to person, place, and time. Mental status is at baseline.  Psychiatric:        Mood and Affect: Mood normal.        Behavior: Behavior normal.        Thought Content: Thought content normal.           Assessment & Plan:  Dysphagia- new.  Pt is unable to say when sxs first started.  Feels that sxs are not severe and likely due to him trying to eat bites that are too large or take too many pills at once.  Discussed risk of choking, aspiration.  Since he has heart surgery upcoming, will get swallow study to assess for aspiration as this could complicate his post-op recovery.  Pt expressed understanding and is in agreement w/ plan.

## 2024-05-30 ENCOUNTER — Ambulatory Visit: Payer: Self-pay | Admitting: Cardiovascular Disease

## 2024-06-01 ENCOUNTER — Ambulatory Visit: Attending: Cardiology

## 2024-06-01 ENCOUNTER — Other Ambulatory Visit: Payer: Self-pay | Admitting: Family Medicine

## 2024-06-01 ENCOUNTER — Encounter: Payer: Self-pay | Admitting: Thoracic Surgery (Cardiothoracic Vascular Surgery)

## 2024-06-01 ENCOUNTER — Ambulatory Visit
Attending: Thoracic Surgery (Cardiothoracic Vascular Surgery) | Admitting: Thoracic Surgery (Cardiothoracic Vascular Surgery)

## 2024-06-01 VITALS — BP 115/76 | HR 98 | Resp 20 | Ht 69.0 in | Wt 204.5 lb

## 2024-06-01 DIAGNOSIS — I4891 Unspecified atrial fibrillation: Secondary | ICD-10-CM

## 2024-06-01 DIAGNOSIS — Z5181 Encounter for therapeutic drug level monitoring: Secondary | ICD-10-CM

## 2024-06-01 DIAGNOSIS — I350 Nonrheumatic aortic (valve) stenosis: Secondary | ICD-10-CM

## 2024-06-01 LAB — POCT INR: INR: 2 (ref 2.0–3.0)

## 2024-06-01 NOTE — Progress Notes (Signed)
 Description   *DOES NOT NEED A PRINTOUT* Take 1.5 tablets today and then continue taking 1 tablet daily except for 1/2 tablet on Saturdays.   Stay consistent with greens each week (3 per week).  Repeat INR in 2 weeks.  Call us  with any medication changes or concerns # [phone] [facility]madin  Clinic.   Allopurinol  Daily started 1/3    INR 2.0

## 2024-06-01 NOTE — Patient Instructions (Signed)
 Description   *DOES NOT NEED A PRINTOUT* Take 1.5 tablets today and then continue taking 1 tablet daily except for 1/2 tablet on Saturdays.   Stay consistent with greens each week (3 per week).  Repeat INR in 2 weeks.  Call us  with any medication changes or concerns # [phone] Coumadin  Clinic.   Allopurinol  Daily started 1/3

## 2024-06-01 NOTE — Progress Notes (Signed)
 [address]       [place] [address]             [phone]        Marvin Foster [name] Medical Record #[id number] Date of Birth: December 02, 1933  Referring: Marvin Foster* Primary Care: Marvin Foster BRAVO, MD Primary Cardiologist:Marvin Jeffrie, MD  Chief Complaint:    Chief Complaint  Patient presents with   Aortic Stenosis    TAVR consult, review all studies    History of Present Illness:     Marvin Foster is a 89 y.o. male presents for surgical evaluation of severe aortic stenosis.  He occasionally has some dizziness, and will get short of breath when walking up inclines.      Past Medical History:  Diagnosis Date   Acute respiratory failure due to COVID-19 [facility]) 07/07/2020   ANEMIA 02/18/2010   Qualifier: Diagnosis of  By: Mahlon MD, Foster     Arthritis 12/2005   left hand   Arthritis    Left knee   ASPARTATE AMINOTRANSFERASE, SERUM, ELEVATED 11/04/2008   Qualifier: Diagnosis of  By: Mahlon MD, Katherine     Atrial fibrillation (HCC) 07/24/2013   Blood transfusion without reported diagnosis    Bursitis of knee    right knee   CAD (coronary artery disease)    Cardiac arrhythmia due to congenital heart disease    Chronic anticoagulation 06/12/2015   Coagulation defect (HCC) 05/28/2020   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   CORONARY ARTERY DISEASE 03/27/2007   Qualifier: Diagnosis of  By: Marvin Foster     Diabetes mellitus    Diverticulosis    Glaucoma    left eye   Hx of colonic polyps 06/12/2015   Hyperlipidemia    Hypertension    Hypothyroid 09/21/2018   Long term current use of anticoagulant therapy 07/24/2013   Neuropathy    with pain   Pain due to onychomycosis of toenail of left foot 05/28/2020   Permanent atrial fibrillation (HCC)    RBBB    Scrotal mass 03/07/2012   Spinal stenosis 01/20/2005   Tubular adenoma of colon     Past Surgical History:  Procedure  Laterality Date   BACK SURGERY     BIOPSY  09/16/2022   Procedure: BIOPSY;  Surgeon: [name], MD;  Location: [facility];  Service: Gastroenterology;;   CATARACT EXTRACTION  08/11/2009   right   COLONOSCOPY WITH PROPOFOL  N/A 09/16/2022   Procedure: COLONOSCOPY WITH PROPOFOL ;  Surgeon: [name], MD;  Location: [facility];  Service: Gastroenterology;  Laterality: N/A;   corneal endothelial Left 2013   CORNEAL TRANSPLANT Left    eye   CORONARY ANGIOGRAPHY N/A 05/16/2024   Procedure: CORONARY ANGIOGRAPHY;  Surgeon: Marvin Lonni BIRCH, MD;  Location: [facility];  Service: Cardiovascular;  Laterality: N/A;   CORONARY ANGIOPLASTY WITH STENT PLACEMENT     ESOPHAGOGASTRODUODENOSCOPY (EGD) WITH PROPOFOL  N/A 09/16/2022   Procedure: ESOPHAGOGASTRODUODENOSCOPY (EGD) WITH PROPOFOL ;  Surgeon: [name], MD;  Location: [facility];  Service: Gastroenterology;  Laterality: N/A;   EYE SURGERY     HOT HEMOSTASIS N/A 09/16/2022   Procedure: HOT HEMOSTASIS (ARGON PLASMA COAGULATION/BICAP);  Surgeon: [name], MD;  Location: [facility];  Service: Gastroenterology;  Laterality: N/A;   POLYPECTOMY  09/16/2022   Procedure: POLYPECTOMY;  Surgeon: [name], MD;  Location: [facility];  Service: Gastroenterology;;  ROTATOR CUFF REPAIR     SMALL INTESTINE SURGERY     SPINE SURGERY      Social History:  Social History   Tobacco Use  Smoking Status Never  Smokeless Tobacco Never    Social History   Substance and Sexual Activity  Alcohol Use Not Currently   Comment: Very seldom---socially     Allergies  Allergen Reactions   Neomycin-Bacitracin Zn-Polymyx Rash   Penicillins Other (See Comments) and Rash    Pt states this was diagnosed years ago, unsure as to reaction. Will not use.    Enalapril      Stopped 11/2020 due to hyperkalemia due to this   Miconazole Nitrate     Unsure of reaction    Sulfa Antibiotics     Pt not aware of allergy        Current Outpatient Medications  Medication Sig Dispense Refill   allopurinol  (ZYLOPRIM ) 100 MG tablet Take 1 tablet (100 mg total) by mouth daily. 90 tablet 1   amLODipine  (NORVASC ) 2.5 MG tablet Take 1 tablet by mouth once daily 90 tablet 2   atorvastatin  (LIPITOR) 40 MG tablet TAKE 1 TABLET BY MOUTH AT BEDTIME 90 tablet 0   BESIVANCE 0.6 % SUSP Place 1 drop into the left eye 4 (four) times daily. Pt use drop after each monthly eye injection.     Calcium  Carbonate-Vitamin D (CALCIUM  600 + D PO) Take 1 tablet by mouth 2 (two) times daily.     cetirizine  (ZYRTEC ) 10 MG tablet Take 1 tablet by mouth once daily 90 tablet 0   diclofenac  Sodium (VOLTAREN ) 1 % GEL Apply 2 g topically 4 (four) times daily. 100 g 1   ferrous sulfate 325 (65 FE) MG tablet Take 325 mg by mouth daily.     fluorometholone  (FML) 0.1 % ophthalmic suspension Place 1 drop into both eyes every 30 (thirty) days.      furosemide  (LASIX ) 40 MG tablet Take 1 tablet by mouth once daily 90 tablet 0   Lancets (ONETOUCH ULTRASOFT) lancets Use as instructed 100 each 12   latanoprost  (XALATAN ) 0.005 % ophthalmic solution SMARTSIG:In Eye(s)     levothyroxine  (SYNTHROID ) 75 MCG tablet Take 1 tablet (75 mcg total) by mouth daily. 90 tablet 0   magnesium  oxide (MAGOX 400) 400 (241.3 Mg) MG tablet Take 1 tablet (400 mg total) by mouth 2 (two) times daily. 60 tablet 11   metFORMIN  (GLUCOPHAGE ) 1000 MG tablet Take 1 tablet by mouth twice daily 180 tablet 0   metoprolol  tartrate (LOPRESSOR ) 100 MG tablet Take 1 tablet by mouth twice daily 180 tablet 0   Multiple Vitamin (MULTIVITAMIN) capsule Take 1 capsule by mouth daily.     Omega-3 Fatty Acids (FISH OIL) 1200 MG CAPS Take 3 capsules by mouth daily.     ONETOUCH VERIO test strip USE 1 STRIP TO CHECK GLUCOSE TWICE DAILY AS DIRECTED 100 each 0   tamsulosin  (FLOMAX ) 0.4 MG CAPS capsule Take 1 capsule (0.4 mg total) by mouth daily. 90 capsule 1   timolol  (TIMOPTIC ) 0.5 % ophthalmic  solution Place 1 drop into both eyes daily.      tolterodine  (DETROL ) 1 MG tablet Take 1 tablet (1 mg total) by mouth 2 (two) times daily. 60 tablet 3   warfarin (COUMADIN ) 2.5 MG tablet TAKE 1/2 TO 1 (ONE-HALF TO ONE) TABLET BY MOUTH ONCE DAILY OR  AS  DIRECTED  BY  ANTICOAGULATION  CLINIC 100 tablet 1   No current facility-administered medications  for this visit.    (Not in a hospital admission)   Family History  Problem Relation Age of Onset   Heart failure Mother        Heart attack age 76   Heart failure Father        Heart attack age 83   Stomach cancer Neg Hx    Colon cancer Neg Hx    Pancreatic cancer Neg Hx    Throat cancer Neg Hx      Review of Systems:   Review of Systems  Constitutional:  Positive for malaise/fatigue.  Respiratory:  Negative for shortness of breath.   Cardiovascular:  Negative for chest pain.  Neurological:  Positive for dizziness.      Physical Exam: BP 115/76 (BP Location: Right Arm, Patient Position: Sitting, Cuff Size: Normal)   Pulse 98   Resp 20   Ht 5' 9 (1.753 m)   Wt 204 lb 8 oz (92.8 kg)   SpO2 98% Comment: RA  BMI 30.20 kg/m  Physical Exam Constitutional:      General: He is not in acute distress.    Appearance: He is not ill-appearing.  HENT:     Head: Normocephalic and atraumatic.  Eyes:     Extraocular Movements: Extraocular movements intact.  Cardiovascular:     Rate and Rhythm: Normal rate.  Pulmonary:     Effort: Pulmonary effort is normal. No respiratory distress.  Abdominal:     General: Abdomen is flat. There is no distension.  Musculoskeletal:        General: Normal range of motion.     Cervical back: Normal range of motion.  Skin:    General: Skin is warm and dry.  Neurological:     General: No focal deficit present.     Mental Status: He is alert and oriented to person, place, and time.       Cardiac Studies & Procedures    ______________________________________________________________________________________________ CARDIAC CATHETERIZATION  CARDIAC CATHETERIZATION 05/16/2024  Conclusion   Prox RCA lesion is 20% stenosed.   Prox Cx to Mid Cx lesion is 20% stenosed.   Prox LAD to Mid LAD lesion is 20% stenosed.  Mild non-obstructive CAD  Recommendations: Continue workup for TAVR  Findings Coronary Findings Diagnostic  Dominance: Right  Left Anterior Descending Vessel is large. Prox LAD to Mid LAD lesion is 20% stenosed.  Left Circumflex Vessel is large. Prox Cx to Mid Cx lesion is 20% stenosed.  Right Coronary Artery Vessel is large. Prox RCA lesion is 20% stenosed. The lesion is calcified.  Intervention  No interventions have been documented.     ECHOCARDIOGRAM  ECHOCARDIOGRAM COMPLETE 12/19/2023  Narrative ECHOCARDIOGRAM REPORT    Patient Name:   CANTON YEARBY Date of Exam: 12/19/2023 Medical Rec #:  [id number]       Height:       67.0 in Accession #:    [id number]      Weight:       210.0 lb Date of Birth:  13-Jan-1934       BSA:          2.065 m Patient Age:    89 years        BP:           108/66 mmHg Patient Gender: M               HR:           50 bpm. Exam Location:  [facility]  Procedure:  2D Echo (Both Spectral and Color Flow Doppler were utilized during procedure).  Indications:    I35.0 Nonrheumatic aortic (valve) stenosis  History:        Patient has prior history of Echocardiogram examinations, most recent 06/16/2023. CAD, Arrythmias:RBBB; Risk Factors:Hypertension, Diabetes and Dyslipidemia. Hypothyroidism.  Sonographer:    Jon Hacker RCS Referring Phys: KATHERINE E TABORI  IMPRESSIONS   1. Left ventricular ejection fraction, by estimation, is 60 to 65%. The left ventricle has normal function. There is mild left ventricular hypertrophy. Left ventricular diastolic parameters are indeterminate. 2. Right ventricular systolic function is low normal. The  right ventricular size is normal. 3. Left atrial size was mildly dilated. 4. Right atrial size was mildly dilated. 5. Mild to moderate mitral valve regurgitation. 6. AV is thickened, calcified with restricted motion. Diffiuclt to discern individual leaflets. Peak and mean gradients through the valve are 41 and 25 mm Hg respectively. AVA (by VTI) is 0.58 cm2. DVI is 0.2 Overall consistent with low flow, severe AS. (SVI is 26). SABRA Aortic valve regurgitation is not visualized. 7. Aortic dilatation noted. There is mild dilatation of the ascending aorta, measuring 41 mm. 8. The inferior vena cava is normal in size with greater than 50% respiratory variability, suggesting right atrial pressure of 3 mmHg.  FINDINGS Left Ventricle: Left ventricular ejection fraction, by estimation, is 60 to 65%. The left ventricle has normal function. The left ventricular internal cavity size was normal in size. There is mild left ventricular hypertrophy. Left ventricular diastolic parameters are indeterminate.  Right Ventricle: The right ventricular size is normal. Right vetricular wall thickness was not assessed. Right ventricular systolic function is low normal.  Left Atrium: Left atrial size was mildly dilated.  Right Atrium: Right atrial size was mildly dilated.  Pericardium: There is no evidence of pericardial effusion.  Mitral Valve: There is mild thickening of the mitral valve leaflet(s). There is mild calcification of the mitral valve leaflet(s). Mild mitral annular calcification. Mild to moderate mitral valve regurgitation.  Tricuspid Valve: The tricuspid valve is normal in structure. Tricuspid valve regurgitation is mild.  Aortic Valve: AV is thickened, calcified with restricted motion. Diffiuclt to discern individual leaflets. Peak and mean gradients through the valve are 41 and 25 mm Hg respectively. AVA (by VTI) is 0.58 cm2. DVI is 0.2 Overall consistent with low flow, severe AS. (SVI is 26). Aortic  valve regurgitation is not visualized. Aortic valve mean gradient measures 27.0 mmHg. Aortic valve peak gradient measures 48.2 mmHg. Aortic valve area, by VTI measures 0.62 cm.  Pulmonic Valve: The pulmonic valve was grossly normal. Pulmonic valve regurgitation is not visualized.  Aorta: The aortic root is normal in size and structure and aortic dilatation noted. There is mild dilatation of the ascending aorta, measuring 41 mm.  Venous: The inferior vena cava is normal in size with greater than 50% respiratory variability, suggesting right atrial pressure of 3 mmHg.  IAS/Shunts: No atrial level shunt detected by color flow Doppler.   LEFT VENTRICLE PLAX 2D LVIDd:         5.00 cm LVIDs:         2.90 cm LV PW:         1.00 cm LV IVS:        1.10 cm LVOT diam:     2.00 cm LV SV:         54 LV SV Index:   26 LVOT Area:     3.14 cm   RIGHT VENTRICLE RV  S prime:     9.57 cm/s TAPSE (M-mode): 1.2 cm RVSP:           24.9 mmHg  LEFT ATRIUM             Index        RIGHT ATRIUM           Index LA diam:        5.20 cm 2.52 cm/m   RA Pressure: 3.00 mmHg LA Vol (A2C):   76.9 ml 37.25 ml/m  RA Area:     19.60 cm LA Vol (A4C):   60.1 ml 29.11 ml/m  RA Volume:   59.00 ml  28.58 ml/m LA Biplane Vol: 70.1 ml 33.95 ml/m AORTIC VALVE AV Area (Vmax):    0.65 cm AV Area (Vmean):   0.63 cm AV Area (VTI):     0.62 cm AV Vmax:           347.00 cm/s AV Vmean:          237.000 cm/s AV VTI:            0.870 m AV Peak Grad:      48.2 mmHg AV Mean Grad:      27.0 mmHg LVOT Vmax:         71.90 cm/s LVOT Vmean:        47.800 cm/s LVOT VTI:          0.173 m LVOT/AV VTI ratio: 0.20  AORTA Ao Root diam: 3.10 cm Ao Asc diam:  4.10 cm  MR Peak grad: 117.5 mmHg  TRICUSPID VALVE MR Mean grad: 70.0 mmHg   TR Peak grad:   21.9 mmHg MR Vmax:      542.00 cm/s TR Vmax:        234.00 cm/s MR Vmean:     393.0 cm/s  Estimated RAP:  3.00 mmHg RVSP:           24.9 mmHg  SHUNTS Systemic VTI:  0.17  m Systemic Diam: 2.00 cm  Vina Gull MD Electronically signed by Vina Gull MD Signature Date/Time: 12/19/2023/11:17:58 PM    Final      CT SCANS  CT CORONARY MORPH W/CTA COR W/SCORE 05/23/2024  Addendum 05/29/2024  5:20 PM ADDENDUM REPORT: 05/29/2024 17:18  EXAM: OVER-READ INTERPRETATION CT CHEST  The following report is an over-read performed by radiologist Dr. Rogelia Myers of [facility] on 05/29/2024. This over-read does not include interpretation of cardiac or coronary anatomy or pathology. The coronary CTA interpretation by the cardiologist is attached.  COMPARISON:  None Available.  FINDINGS: Pulmonary Embolism: No pulmonary embolism.  Cardiovascular: No pericardial effusion. No aortic aneurysm. Scattered calcified atherosclerosis throughout the descending aorta.  Mediastinum/Nodes: No mediastinal mass. 1.5 cm precarinal lymph node. No additional lymphadenopathy in the chest. Normal esophagus.  Lungs/Pleura: The midline trachea and bronchi are patent. Subsegmental atelectasis along the paramediastinal right middle lobe. No focal airspace consolidation, pleural effusion, or pneumothorax. Fibrolinear scarring in the left lower lobe.  Musculoskeletal: No acute fracture or destructive bone lesion. Multilevel degenerative disc disease of the spine. Thoracic DISH.  Upper Abdomen: No acute abnormality in the partially visualized upper abdomen.  IMPRESSION: No acute abnormality within the visualized chest. No pneumonia, pulmonary edema, or pleural effusion.   Electronically Signed By: Rogelia Myers M.D. On: 05/29/2024 17:18  Narrative CLINICAL DATA:  Severe Aortic Stenosis.  EXAM: Cardiac TAVR CT  TECHNIQUE: A non-contrast, gated CT scan was obtained with axial slices of 2.5 mm  through the heart for aortic valve scoring. A 120 kV retrospective, gated, contrast cardiac scan was obtained. Gantry rotation speed was 230 msec and  collimation was 0.63 mm. Nitroglycerin was not given. The 3D dataset was reconstructed in systole with motion correction. The 3D data set was reconstructed in 5% intervals of the 0-95% of the R-R cycle. Systolic and diastolic phases were analyzed on a dedicated workstation using MPR, MIP, and VRT modes. The patient received 100 cc of contrast.  FINDINGS: Image quality: Excellent.  Noise artifact is: Limited.  Valve Morphology: Tricuspid aortic valve with severe calcifications. Severely restricted movement in systole. Bulky calcification of the NCC.  Aortic Valve Calcium  score: 5017  Aortic annular dimension:  Phase assessed: 22%  Annular area: 471 mm2  Annular perimeter: 78.1 mm  Max diameter: 27.2 mm  Min diameter: 22.1 mm  Annular and subannular calcification: Mild annular calcium  under the LCC.  Membranous septum length: 13.6 mm  Optimal coplanar projection: LAO 11 CRA 0  Coronary Artery Height above Annulus:  Left Main: 10.6 mm  Right Coronary: 15.9 mm  Sinus of Valsalva Measurements:  Non-coronary: 34 mm  Right-coronary: 34 mm  Left-coronary: 36 mm  Sinus of Valsalva Height:  Non-coronary: 26.9 mm  Right-coronary: 24.8 mm  Left-coronary: 21.2 mm  Sinotubular Junction: 33 mm  Ascending Thoracic Aorta: 37 mm  Coronary Arteries: Normal coronary origin. Right dominance. The study was performed without use of NTG and is insufficient for plaque evaluation. Please refer to recent cardiac catheterization for coronary assessment.  Cardiac Morphology:  Right Atrium: Right atrial size is dilated. Contrast reflux noted in the IVC consistent with elevated RA pressure.  Right Ventricle: The right ventricular cavity is dilated.  Left Atrium: Left atrial size is normal in size with no left atrial appendage filling defect.  Left Ventricle: The ventricular cavity size is within normal limits.  Pulmonary arteries: Dilated pulmonary artery suggestive  of pulmonary hypertension.  Pulmonary veins: Normal pulmonary venous drainage.  Pericardium: Normal thickness with no significant effusion or calcium  present.  Mitral Valve: The mitral valve is normal structure without significant calcification.  Extra-cardiac findings: See attached radiology report for non-cardiac structures.  IMPRESSION: 1. Tricuspid aortic valve with severe aortic stenosis.  2. Annular measurements support a 26 mm S3 or 29 mm Evolut Pro.  3. Mild annular calcium  under the LCC.  4. Shallow left main height but large sinuses noted.  5. Optimal Fluoroscopic Angle for Delivery: LAO 11 CRA 0  6. Dilated pulmonary artery suggestive of pulmonary hypertension.  Darryle T. Barbaraann, MD  Electronically Signed: By: Darryle Barbaraann M.D. On: 05/23/2024 18:54     ______________________________________________________________________________________________      ECG Afib, RBBB    I have independently reviewed the above radiologic studies and discussed with the patient   Recent Lab Findings: Lab Results  Component Value Date   WBC 6.5 05/09/2024   HGB 11.8 (L) 05/09/2024   HCT 36.3 (L) 05/09/2024   PLT 138 (L) 05/09/2024   GLUCOSE 121 (H) 05/09/2024   CHOL 91 01/18/2024   TRIG 121.0 01/18/2024   HDL 29.00 (L) 01/18/2024   LDLDIRECT 104.0 01/17/2023   LDLCALC 38 01/18/2024   ALT 35 01/18/2024   AST 35 01/18/2024   NA 138 05/09/2024   K 4.4 05/09/2024   CL 98 05/09/2024   CREATININE 1.24 05/09/2024   BUN 25 05/09/2024   CO2 23 05/09/2024   TSH 1.17 01/18/2024   INR 1.8 (A) 05/23/2024   HGBA1C 6.8 (  H) 01/18/2024      Assessment / Plan:   89 y.o. male with severe aortic stenosis.  STS score: 6.  NYHA Class II.  The risks and benefits of transfemoral TAVR were discussed in detail.  We also discussed possibility of an emergent sternotomy to address any procedural complications.  Based on our discussion, we collectively decided that an emergent  sternotomy would not be indicated.  The patient is agreeable to proceed.  Based on my review of her LHC, echo, and CTA, I agree with the multidisciplinary plan to proceed with a 26mm S# TAVR.      I  spent 40 minutes counseling the patient face to face.   Linnie MALVA Rayas 06/01/2024 12:36 PM

## 2024-06-06 ENCOUNTER — Encounter

## 2024-06-07 ENCOUNTER — Other Ambulatory Visit: Payer: Self-pay | Admitting: Family Medicine

## 2024-06-07 ENCOUNTER — Other Ambulatory Visit: Payer: Self-pay

## 2024-06-07 DIAGNOSIS — I350 Nonrheumatic aortic (valve) stenosis: Secondary | ICD-10-CM

## 2024-06-13 ENCOUNTER — Ambulatory Visit: Admitting: *Deleted

## 2024-06-13 ENCOUNTER — Other Ambulatory Visit: Payer: Self-pay | Admitting: Family Medicine

## 2024-06-13 VITALS — Ht 69.0 in | Wt 204.0 lb

## 2024-06-13 DIAGNOSIS — Z0000 Encounter for general adult medical examination without abnormal findings: Secondary | ICD-10-CM | POA: Diagnosis not present

## 2024-06-13 NOTE — Progress Notes (Signed)
 Subjective:   Marvin Foster is a 89 y.o. male who presents for Medicare Annual/Subsequent preventive examination.  Visit Complete: Virtual I connected with  Marvin Foster on 06/13/24 by a audio enabled telemedicine application and verified that I am speaking with the correct person using two identifiers.  Patient Location: Home  Provider Location: Home Office  I discussed the limitations of evaluation and management by telemedicine. The patient expressed understanding and agreed to proceed.  Vital Signs: Because this visit was a virtual/telehealth visit, some criteria may be missing or patient reported. Any vitals not documented were not able to be obtained and vitals that have been documented are patient reported.   Cardiac Risk Factors include: diabetes mellitus;hypertension;male gender;advanced age (>62men, >53 women);obesity (BMI >30kg/m2)     Objective:    Today's Vitals   06/13/24 1517  Weight: 204 lb (92.5 kg)  Height: 5' 9 (1.753 m)   Body mass index is 30.13 kg/m.     06/13/2024    3:16 PM 05/16/2024   11:20 AM 05/11/2023   10:36 AM 09/16/2022    9:29 AM 08/26/2022    8:46 AM 08/13/2021    9:59 AM 08/11/2020    9:05 AM  Advanced Directives  Does Patient Have a Medical Advance Directive? No No No No No No No  Would patient like information on creating a medical advance directive? No - Patient declined No - Patient declined No - Patient declined No - Patient declined No - Patient declined No - Patient declined Yes (MAU/Ambulatory/Procedural Areas - Information given)    Current Medications (verified) Outpatient Encounter Medications as of 06/13/2024  Medication Sig   allopurinol  (ZYLOPRIM ) 100 MG tablet Take 1 tablet (100 mg total) by mouth daily.   amLODipine  (NORVASC ) 2.5 MG tablet Take 1 tablet by mouth once daily   atorvastatin  (LIPITOR) 40 MG tablet TAKE 1 TABLET BY MOUTH AT BEDTIME   BESIVANCE 0.6 % SUSP Place 1 drop into the left eye 4 (four) times daily.  Pt use drop after each monthly eye injection.   Calcium  Carbonate-Vitamin D (CALCIUM  600 + D PO) Take 1 tablet by mouth 2 (two) times daily.   cetirizine  (ZYRTEC ) 10 MG tablet Take 1 tablet by mouth once daily   diclofenac  Sodium (VOLTAREN ) 1 % GEL Apply 2 g topically 4 (four) times daily.   ferrous sulfate 325 (65 FE) MG tablet Take 325 mg by mouth daily.   fluorometholone  (FML) 0.1 % ophthalmic suspension Place 1 drop into both eyes every 30 (thirty) days.    furosemide  (LASIX ) 40 MG tablet Take 1 tablet by mouth once daily   Lancets (ONETOUCH ULTRASOFT) lancets Use as instructed   latanoprost  (XALATAN ) 0.005 % ophthalmic solution SMARTSIG:In Eye(s)   levothyroxine  (SYNTHROID ) 75 MCG tablet Take 1 tablet by mouth once daily   magnesium  oxide (MAGOX 400) 400 (241.3 Mg) MG tablet Take 1 tablet (400 mg total) by mouth 2 (two) times daily.   metFORMIN  (GLUCOPHAGE ) 1000 MG tablet Take 1 tablet by mouth twice daily   metoprolol  tartrate (LOPRESSOR ) 100 MG tablet Take 1 tablet by mouth twice daily   Multiple Vitamin (MULTIVITAMIN) capsule Take 1 capsule by mouth daily.   Omega-3 Fatty Acids (FISH OIL) 1200 MG CAPS Take 3 capsules by mouth daily.   ONETOUCH VERIO test strip USE 1 STRIP TO CHECK GLUCOSE TWICE DAILY AS DIRECTED   tamsulosin  (FLOMAX ) 0.4 MG CAPS capsule Take 1 capsule by mouth once daily   timolol  (TIMOPTIC ) 0.5 %  ophthalmic solution Place 1 drop into both eyes daily.    tolterodine  (DETROL ) 1 MG tablet Take 1 tablet (1 mg total) by mouth 2 (two) times daily.   warfarin (COUMADIN ) 2.5 MG tablet TAKE 1/2 TO 1 (ONE-HALF TO ONE) TABLET BY MOUTH ONCE DAILY OR  AS  DIRECTED  BY  ANTICOAGULATION  CLINIC   No facility-administered encounter medications on file as of 06/13/2024.    Allergies (verified) Neomycin-bacitracin zn-polymyx, Penicillins, Enalapril , Miconazole nitrate, and Sulfa antibiotics   History: Past Medical History:  Diagnosis Date   Acute respiratory failure due to  COVID-19 (HCC) 07/07/2020   ANEMIA 02/18/2010   Qualifier: Diagnosis of  By: Mahlon MD, Comer     Arthritis 12/2005   left hand   Arthritis    Left knee   ASPARTATE AMINOTRANSFERASE, SERUM, ELEVATED 11/04/2008   Qualifier: Diagnosis of  By: Mahlon MD, Katherine     Atrial fibrillation (HCC) 07/24/2013   Blood transfusion without reported diagnosis    Bursitis of knee    right knee   CAD (coronary artery disease)    Cardiac arrhythmia due to congenital heart disease    Chronic anticoagulation 06/12/2015   Coagulation defect (HCC) 05/28/2020   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   CORONARY ARTERY DISEASE 03/27/2007   Qualifier: Diagnosis of  By: Tita MD, Luis     Diabetes mellitus    Diverticulosis    Glaucoma    left eye   Hx of colonic polyps 06/12/2015   Hyperlipidemia    Hypertension    Hypothyroid 09/21/2018   Long term current use of anticoagulant therapy 07/24/2013   Neuropathy    with pain   Pain due to onychomycosis of toenail of left foot 05/28/2020   Permanent atrial fibrillation (HCC)    RBBB    Scrotal mass 03/07/2012   Spinal stenosis 01/20/2005   Tubular adenoma of colon    Past Surgical History:  Procedure Laterality Date   BACK SURGERY     BIOPSY  09/16/2022   Procedure: BIOPSY;  Surgeon: [name], MD;  Location: [facility];  Service: Gastroenterology;;   CATARACT EXTRACTION  08/11/2009   right   COLONOSCOPY WITH PROPOFOL  N/A 09/16/2022   Procedure: COLONOSCOPY WITH PROPOFOL ;  Surgeon: [name], MD;  Location: [facility];  Service: Gastroenterology;  Laterality: N/A;   corneal endothelial Left 2013   CORNEAL TRANSPLANT Left    eye   CORONARY ANGIOGRAPHY N/A 05/16/2024   Procedure: CORONARY ANGIOGRAPHY;  Surgeon: [name], MD;  Location: [facility];  Service: Cardiovascular;  Laterality: N/A;   CORONARY ANGIOPLASTY WITH STENT PLACEMENT     ESOPHAGOGASTRODUODENOSCOPY  (EGD) WITH PROPOFOL  N/A 09/16/2022   Procedure: ESOPHAGOGASTRODUODENOSCOPY (EGD) WITH PROPOFOL ;  Surgeon: [name], MD;  Location: [facility];  Service: Gastroenterology;  Laterality: N/A;   EYE SURGERY     HOT HEMOSTASIS N/A 09/16/2022   Procedure: HOT HEMOSTASIS (ARGON PLASMA COAGULATION/BICAP);  Surgeon: [name], MD;  Location: [facility];  Service: Gastroenterology;  Laterality: N/A;   POLYPECTOMY  09/16/2022   Procedure: POLYPECTOMY;  Surgeon: [name], MD;  Location: [facility];  Service: Gastroenterology;;   ROTATOR CUFF REPAIR     SMALL INTESTINE SURGERY     SPINE SURGERY     Family History  Problem Relation Age of Onset   Heart failure Mother        Heart attack age 26   Heart failure Father  Heart attack age 61   Stomach cancer Neg Hx    Colon cancer Neg Hx    Pancreatic cancer Neg Hx    Throat cancer Neg Hx    Social History   Socioeconomic History   Marital status: Divorced    Spouse name: Not on file   Number of children: 5   Years of education: Not on file   Highest education level: 12th grade  Occupational History   Occupation: retired   Occupation: retired   Occupation: Retired from [employer]  Tobacco Use   Smoking status: Never   Smokeless tobacco: Never  Vaping Use   Vaping status: Never Used  Substance and Sexual Activity   Alcohol use: Not Currently    Comment: Very seldom---socially   Drug use: No   Sexual activity: Not Currently    Partners: Female  Other Topics Concern   Not on file  Social History Narrative   Volunteers at [employer]         Social Drivers of Health   Financial Resource Strain: Low Risk  (06/13/2024)   Overall Financial Resource Strain (CARDIA)    Difficulty of Paying Living Expenses: Not hard at all  Food Insecurity: No Food Insecurity (06/13/2024)   Hunger Vital Sign    Worried About Running Out of Food in the Last Year: Never true    Ran Out of Food in the Last Year: Never true   Transportation Needs: No Transportation Needs (01/17/2024)   PRAPARE - Administrator, Civil Service (Medical): No    Lack of Transportation (Non-Medical): No  Physical Activity: Inactive (06/13/2024)   Exercise Vital Sign    Days of Exercise per Week: 0 days    Minutes of Exercise per Session: 0 min  Stress: No Stress Concern Present (06/13/2024)   [employer] of Occupational Health - Occupational Stress Questionnaire    Feeling of Stress: Not at all  Social Connections: Moderately Isolated (06/13/2024)   Social Connection and Isolation Panel    Frequency of Communication with Friends and Family: More than three times a week    Frequency of Social Gatherings with Friends and Family: Three times a week    Attends Religious Services: Never    Active Member of Clubs or Organizations: Yes    Attends Engineer, structural: More than 4 times per year    Marital Status: Divorced    Tobacco Counseling Counseling given: Not Answered   Clinical Intake:  Pre-visit preparation completed: Yes  Pain : No/denies pain     Diabetes: Yes CBG done?: No Did pt. bring in CBG monitor from home?: No  How often do you need to have someone help you when you read instructions, pamphlets, or other written materials from your doctor or pharmacy?: 1 - Never  Interpreter Needed?: No  Information entered by :: Mliss Graff LPN   Activities of Daily Living    06/13/2024    3:19 PM  In your present state of health, do you have any difficulty performing the following activities:  Hearing? 1  Vision? 0  Difficulty concentrating or making decisions? 0  Walking or climbing stairs? 0  Dressing or bathing? 0  Doing errands, shopping? 1  Preparing Food and eating ? N  Using the Toilet? N  In the past six months, have you accidently leaked urine? N  Do you have problems with loss of bowel control? N  Managing your Medications? N  Managing your Finances? N  Housekeeping  or  managing your Housekeeping? N    Patient Care Team: [name] BRAVO, MD as PCP - General Jeffrie Oneil BROCKS, MD as PCP - Cardiology (Cardiology) Jeffrie Oneil BROCKS, MD as Consulting Physician (Cardiology) [facility] Bethany Beers, [place] (Specialist) Loreda Hacker, DPM as Consulting Physician (Podiatry) Dow Maxwell, PT as Physical Therapist (Physical Therapy) Nicholaus Sherlean CROME, [facility] (Inactive) (Pharmacist)  Indicate any recent Medical Services you may have received from other than [facility] providers in the past year (date may be approximate).     Assessment:   This is a routine wellness examination for Damarie.  Hearing/Vision screen Hearing Screening - Comments:: Bilateral hearing aids Vision Screening - Comments:: [facility] Up to date   Goals Addressed   None    Depression Screen    06/13/2024    3:21 PM 12/02/2023   12:57 PM 07/20/2023    1:29 PM 06/03/2023    1:25 PM 05/11/2023   10:41 AM 05/04/2023   10:55 AM 01/17/2023    8:33 AM  PHQ 2/9 Scores  PHQ - 2 Score 0 0 0 0 0 0 0  PHQ- 9 Score 0 0 0 0 0  0    Fall Risk    06/13/2024    3:15 PM 04/12/2024   10:54 AM 12/02/2023   12:57 PM 10/14/2023    2:36 PM 07/20/2023    1:28 PM  Fall Risk   Falls in the past year? 0 1 0 0 0  Number falls in past yr: 0 0 0 0 0  Injury with Fall? 0 1 0 0 0  Risk for fall due to :   No Fall Risks No Fall Risks No Fall Risks  Follow up Falls evaluation completed;Education provided;Falls prevention discussed  Falls evaluation completed  Falls evaluation completed    MEDICARE RISK AT HOME: Medicare Risk at Home Any stairs in or around the home?: No If so, are there any without handrails?: No Home free of loose throw rugs in walkways, pet beds, electrical cords, etc?: Yes Adequate lighting in your home to reduce risk of falls?: Yes Life alert?: No Use of a cane, walker or w/c?: No Grab bars in the bathroom?: Yes Shower chair or bench in shower?: No Elevated toilet  seat or a handicapped toilet?: Yes  TIMED UP AND GO:  Was the test performed?  No    Cognitive Function:    02/24/2018    9:12 AM 09/25/2015    1:32 PM  MMSE - Mini Mental State Exam  Orientation to time 5 5   Orientation to Place 5 5   Registration 3 3   Attention/ Calculation 5 5   Recall 3 2   Language- name 2 objects 2 2   Language- repeat 1 1  Language- follow 3 step command 3 3   Language- read & follow direction 1 1   Write a sentence 1 1   Copy design 1 1   Total score 30 29      Data saved with a previous flowsheet row definition        06/13/2024    3:18 PM 05/11/2023   10:37 AM 08/26/2022    8:49 AM 08/11/2020    9:10 AM  6CIT Screen  What Year? 0 points 0 points 0 points 0 points  What month? 0 points 0 points 0 points 0 points  What time? 0 points 0 points 0 points 0 points  Count back from 20 0  points 0 points 0 points 0 points  Months in reverse 0 points 0 points 0 points 0 points  Repeat phrase 0 points 0 points 0 points 0 points  Total Score 0 points 0 points 0 points 0 points    Immunizations Immunization History  Administered Date(s) Administered    sv, Bivalent, Protein Subunit Rsvpref,pf (Abrysvo) 12/27/2022   Fluad Quad(high Dose 65+) 07/14/2019, 07/18/2020, 06/22/2021   Fluad Trivalent(High Dose 65+) 07/20/2023   INFLUENZA, HIGH DOSE SEASONAL PF 07/30/2016   Influenza Inj Mdck Quad Pf 07/12/2022   Influenza Split 08/06/2015, 07/25/2022   Influenza Whole 08/14/2007, 07/23/2009, 07/10/2010, 08/11/2016   Influenza,inj,Quad PF,6+ Mos 08/14/2013, 08/13/2014, 08/10/2018   Influenza,inj,quad, With Preservative 11/11/2017   Influenza-Unspecified 08/06/2015, 08/04/2017, 06/22/2021   Moderna Covid-19 Fall Seasonal Vaccine 65yrs & older 08/02/2023   Moderna Covid-19 Vaccine Bivalent Booster 55yrs & up 08/02/2023   PFIZER Comirnaty(Gray Top)Covid-19 Tri-Sucrose Vaccine 11/24/2020   PFIZER(Purple Top)SARS-COV-2 Vaccination 12/04/2019, 12/25/2019,  11/24/2020   PNEUMOCOCCAL CONJUGATE-20 08/02/2023   Pfizer Covid-19 Vaccine Bivalent Booster 59yrs & up 11/05/2021   Pneumococcal Conjugate-13 01/16/2015   Pneumococcal Polysaccharide-23 12/22/2009, 01/26/2017   Pneumococcal-Unspecified 10/11/2016   Respiratory Syncytial Virus Vaccine,Recomb Aduvanted(Arexvy) 12/28/2022   Tdap 08/28/2014   Zoster Recombinant(Shingrix) 10/31/2018, 04/06/2019   Zoster, Live 08/20/2010    TDAP status: Up to date  Flu Vaccine status: Due, Education has been provided regarding the importance of this vaccine. Advised may receive this vaccine at local pharmacy or Health Dept. Aware to provide a copy of the vaccination record if obtained from local pharmacy or Health Dept. Verbalized acceptance and understanding.  Pneumococcal vaccine status: Up to date  Covid-19 vaccine status: Information provided on how to obtain vaccines.   Qualifies for Shingles Vaccine? No   Zostavax completed Yes   Shingrix Completed?: Yes  Screening Tests Health Maintenance  Topic Date Due   INFLUENZA VACCINE  05/11/2024   FOOT EXAM  07/19/2024   HEMOGLOBIN A1C  07/19/2024   DTaP/Tdap/Td (2 - Td or Tdap) 08/28/2024   OPHTHALMOLOGY EXAM  10/10/2024   Medicare Annual Wellness (AWV)  06/13/2025   Pneumococcal Vaccine: 50+ Years  Completed   Zoster Vaccines- Shingrix  Completed   HPV VACCINES  Aged Out   Meningococcal B Vaccine  Aged Out   COVID-19 Vaccine  Discontinued    Health Maintenance  Health Maintenance Due  Topic Date Due   INFLUENZA VACCINE  05/11/2024    Colorectal cancer screening: No longer required.   Lung Cancer Screening: (Low Dose CT Chest recommended if Age 53-80 years, 20 pack-year currently smoking OR have quit w/in 15years.) does not qualify.   Lung Cancer Screening Referral:   Additional Screening:  Hepatitis C Screening: does not qualify  Vision Screening: Recommended annual ophthalmology exams for early detection of glaucoma and other  disorders of the eye. Is the patient up to date with their annual eye exam?  Yes  Who is the provider or what is the name of the office in which the patient attends annual eye exams? [facility]  If pt is not established with a provider, would they like to be referred to a provider to establish care? No .   Dental Screening: Recommended annual dental exams for proper oral hygiene  Nutrition Risk Assessment:  Has the patient had any N/V/D within the last 2 months?  No  Does the patient have any non-healing wounds?  No  Has the patient had any unintentional weight loss or weight gain?  No  Diabetes:  Is the patient diabetic?  Yes  If diabetic, was a CBG obtained today?  No  Did the patient bring in their glucometer from home?  No  How often do you monitor your CBG's?  2x a day   Financial Strains and Diabetes Management:  Are you having any financial strains with the device, your supplies or your medication? No .  Does the patient want to be seen by Chronic Care Management for management of their diabetes?  No  Would the patient like to be referred to a Nutritionist or for Diabetic Management?  No   Diabetic Exams:  Diabetic Eye Exam: Completed . Pt has been advised about the importance in completing this exam.  Diabetic Foot Exam:. Pt has been advised about the importance in completing this exam.  Community Resource Referral / Chronic Care Management: CRR required this visit?  No   CCM required this visit?  No     Plan:     I have personally reviewed and noted the following in the patient's chart:   Medical and social history Use of alcohol, tobacco or illicit drugs  Current medications and supplements including opioid prescriptions. Patient is not currently taking opioid prescriptions. Functional ability and status Nutritional status Physical activity Advanced directives List of other physicians Hospitalizations, surgeries, and ER visits in previous 12  months Vitals Screenings to include cognitive, depression, and falls Referrals and appointments  In addition, I have reviewed and discussed with patient certain preventive protocols, quality metrics, and best practice recommendations. A written personalized care plan for preventive services as well as general preventive health recommendations were provided to patient.     Mliss Graff, LPN   [id number]   After Visit Summary: (MyChart) Due to this being a telephonic visit, the after visit summary with patients personalized plan was offered to patient via MyChart   Nurse Notes:

## 2024-06-13 NOTE — Patient Instructions (Signed)
 [name] , Thank you for taking time to come for your Medicare Wellness Visit. I appreciate your ongoing commitment to your health goals. Please review the following plan we discussed and let me know if I can assist you in the future.   Screening recommendations/referrals: Colonoscopy: no longer required Recommended yearly ophthalmology/optometry visit for glaucoma screening and checkup Recommended yearly dental visit for hygiene and checkup  Vaccinations: Influenza vaccine: Education provided Pneumococcal vaccine: up tod ate Tdap vaccine: up to date  Shingles vaccine: Education provided       Preventive Care 65 Years and Older, Male Preventive care refers to lifestyle choices and visits with your health care provider that can promote health and wellness. What does preventive care include? A yearly physical exam. This is also called an annual well check. Dental exams once or twice a year. Routine eye exams. Ask your health care provider how often you should have your eyes checked. Personal lifestyle choices, including: Daily care of your teeth and gums. Regular physical activity. Eating a healthy diet. Avoiding tobacco and drug use. Limiting alcohol use. Practicing safe sex. Taking low doses of aspirin  every day. Taking vitamin and mineral supplements as recommended by your health care provider. What happens during an annual well check? The services and screenings done by your health care provider during your annual well check will depend on your age, overall health, lifestyle risk factors, and family history of disease. Counseling  Your health care provider may ask you questions about your: Alcohol use. Tobacco use. Drug use. Emotional well-being. Home and relationship well-being. Sexual activity. Eating habits. History of falls. Memory and ability to understand (cognition). Work and work Astronomer. Screening  You may have the following tests or  measurements: Height, weight, and BMI. Blood pressure. Lipid and cholesterol levels. These may be checked every 5 years, or more frequently if you are over 43 years old. Skin check. Lung cancer screening. You may have this screening every year starting at age 59 if you have a 30-pack-year history of smoking and currently smoke or have quit within the past 15 years. Fecal occult blood test (FOBT) of the stool. You may have this test every year starting at age 58. Flexible sigmoidoscopy or colonoscopy. You may have a sigmoidoscopy every 5 years or a colonoscopy every 10 years starting at age 96. Prostate cancer screening. Recommendations will vary depending on your family history and other risks. Hepatitis C blood test. Hepatitis B blood test. Sexually transmitted disease (STD) testing. Diabetes screening. This is done by checking your blood sugar (glucose) after you have not eaten for a while (fasting). You may have this done every 1-3 years. Abdominal aortic aneurysm (AAA) screening. You may need this if you are a current or former smoker. Osteoporosis. You may be screened starting at age 63 if you are at high risk. Talk with your health care provider about your test results, treatment options, and if necessary, the need for more tests. Vaccines  Your health care provider may recommend certain vaccines, such as: Influenza vaccine. This is recommended every year. Tetanus, diphtheria, and acellular pertussis (Tdap, Td) vaccine. You may need a Td booster every 10 years. Zoster vaccine. You may need this after age 50. Pneumococcal 13-valent conjugate (PCV13) vaccine. One dose is recommended after age 58. Pneumococcal polysaccharide (PPSV23) vaccine. One dose is recommended after age 55. Talk to your health care provider about which screenings and vaccines you need and how often you need them. This information is not intended  to replace advice given to you by your health care provider. Make sure  you discuss any questions you have with your health care provider. Document Released: 10/24/2015 Document Revised: 06/16/2016 Document Reviewed: 07/29/2015 Elsevier Interactive Patient Education  2017 [employer].  Fall Prevention in the Home Falls can cause injuries. They can happen to people of all ages. There are many things you can do to make your home safe and to help prevent falls. What can I do on the outside of my home? Regularly fix the edges of walkways and driveways and fix any cracks. Remove anything that might make you trip as you walk through a door, such as a raised step or threshold. Trim any bushes or trees on the path to your home. Use bright outdoor lighting. Clear any walking paths of anything that might make someone trip, such as rocks or tools. Regularly check to see if handrails are loose or broken. Make sure that both sides of any steps have handrails. Any raised decks and porches should have guardrails on the edges. Have any leaves, snow, or ice cleared regularly. Use sand or salt on walking paths during winter. Clean up any spills in your garage right away. This includes oil or grease spills. What can I do in the bathroom? Use night lights. Install grab bars by the toilet and in the tub and shower. Do not use towel bars as grab bars. Use non-skid mats or decals in the tub or shower. If you need to sit down in the shower, use a plastic, non-slip stool. Keep the floor dry. Clean up any water  that spills on the floor as soon as it happens. Remove soap buildup in the tub or shower regularly. Attach bath mats securely with double-sided non-slip rug tape. Do not have throw rugs and other things on the floor that can make you trip. What can I do in the bedroom? Use night lights. Make sure that you have a light by your bed that is easy to reach. Do not use any sheets or blankets that are too big for your bed. They should not hang down onto the floor. Have a firm  chair that has side arms. You can use this for support while you get dressed. Do not have throw rugs and other things on the floor that can make you trip. What can I do in the kitchen? Clean up any spills right away. Avoid walking on wet floors. Keep items that you use a lot in easy-to-reach places. If you need to reach something above you, use a strong step stool that has a grab bar. Keep electrical cords out of the way. Do not use floor polish or wax that makes floors slippery. If you must use wax, use non-skid floor wax. Do not have throw rugs and other things on the floor that can make you trip. What can I do with my stairs? Do not leave any items on the stairs. Make sure that there are handrails on both sides of the stairs and use them. Fix handrails that are broken or loose. Make sure that handrails are as long as the stairways. Check any carpeting to make sure that it is firmly attached to the stairs. Fix any carpet that is loose or worn. Avoid having throw rugs at the top or bottom of the stairs. If you do have throw rugs, attach them to the floor with carpet tape. Make sure that you have a light switch at the top of the stairs and  the bottom of the stairs. If you do not have them, ask someone to add them for you. What else can I do to help prevent falls? Wear shoes that: Do not have high heels. Have rubber bottoms. Are comfortable and fit you well. Are closed at the toe. Do not wear sandals. If you use a stepladder: Make sure that it is fully opened. Do not climb a closed stepladder. Make sure that both sides of the stepladder are locked into place. Ask someone to hold it for you, if possible. Clearly mark and make sure that you can see: Any grab bars or handrails. First and last steps. Where the edge of each step is. Use tools that help you move around (mobility aids) if they are needed. These include: Canes. Walkers. Scooters. Crutches. Turn on the lights when you go  into a dark area. Replace any light bulbs as soon as they burn out. Set up your furniture so you have a clear path. Avoid moving your furniture around. If any of your floors are uneven, fix them. If there are any pets around you, be aware of where they are. Review your medicines with your doctor. Some medicines can make you feel dizzy. This can increase your chance of falling. Ask your doctor what other things that you can do to help prevent falls. This information is not intended to replace advice given to you by your health care provider. Make sure you discuss any questions you have with your health care provider. Document Released: 07/24/2009 Document Revised: 03/04/2016 Document Reviewed: 11/01/2014 Elsevier Interactive Patient Education  2017 [employer].

## 2024-06-14 ENCOUNTER — Ambulatory Visit: Attending: Cardiology | Admitting: *Deleted

## 2024-06-14 DIAGNOSIS — Z5181 Encounter for therapeutic drug level monitoring: Secondary | ICD-10-CM | POA: Diagnosis not present

## 2024-06-14 DIAGNOSIS — I4891 Unspecified atrial fibrillation: Secondary | ICD-10-CM | POA: Diagnosis not present

## 2024-06-14 LAB — POCT INR: INR: 2.1 (ref 2.0–3.0)

## 2024-06-14 NOTE — Patient Instructions (Signed)
 Description   *DOES NOT NEED A PRINTOUT* INR-2.1; Today take an extra half of warfarin (1.5 tablets) then continue taking 1 tablet daily except for 1/2 tablet on Saturdays.   Stay consistent with greens each week (3 per week).  Repeat INR in 1 week post procedure.  Call us  with any medication changes or concerns # [phone] [facility].   Allopurinol  Daily started 1/3   Please follow procedure instructions for warfarin hold.  When warfarin is resumed please take an extra half of warfarin for 2 days then resume.

## 2024-06-14 NOTE — Progress Notes (Signed)
 Description   *DOES NOT NEED A PRINTOUT* INR-2.1; Today take an extra half of warfarin (1.5 tablets) then continue taking 1 tablet daily except for 1/2 tablet on Saturdays.   Stay consistent with greens each week (3 per week).  Repeat INR in 1 week post procedure.  Call us  with any medication changes or concerns # [phone] [facility].   Allopurinol  Daily started 1/3

## 2024-06-15 ENCOUNTER — Ambulatory Visit

## 2024-06-21 ENCOUNTER — Telehealth: Payer: Self-pay

## 2024-06-21 NOTE — Telephone Encounter (Signed)
 Copied from CRM #[id number]. Topic: Referral - Request for Referral >> Jun 21, 2024  3:29 PM Franky GRADE wrote: Did the patient discuss referral with their provider in the last year? Yes (If No - schedule appointment) (If Yes - send message)  Appointment offered? No  Type of order/referral and detailed reason for visit: Swallow Test   Preference of office, provider, location: Dr.Tabori was going to set up a swallow test, but they have not heard back. He was seen 05/25/2024  If referral order, have you been seen by this specialty before? No (If Yes, this issue or another issue? When? Where?  Can we respond through MyChart? Yes

## 2024-06-22 NOTE — Telephone Encounter (Signed)
 I see that the swallow test was ordered in August. Can you check status?

## 2024-06-29 ENCOUNTER — Other Ambulatory Visit: Payer: Self-pay

## 2024-06-29 ENCOUNTER — Encounter (HOSPITAL_COMMUNITY)
Admission: RE | Admit: 2024-06-29 | Discharge: 2024-06-29 | Disposition: A | Discharge status: Home / Self Care | Source: Ambulatory Visit | Attending: Cardiovascular Disease | Admitting: Cardiovascular Disease

## 2024-06-29 ENCOUNTER — Ambulatory Visit (HOSPITAL_COMMUNITY)
Admission: RE | Admit: 2024-06-29 | Discharge: 2024-06-29 | Disposition: A | Discharge status: Home / Self Care | Source: Ambulatory Visit | Attending: Cardiovascular Disease | Admitting: Cardiovascular Disease

## 2024-06-29 ENCOUNTER — Ambulatory Visit: Payer: Self-pay

## 2024-06-29 DIAGNOSIS — Z01818 Encounter for other preprocedural examination: Secondary | ICD-10-CM | POA: Diagnosis not present

## 2024-06-29 DIAGNOSIS — I350 Nonrheumatic aortic (valve) stenosis: Secondary | ICD-10-CM | POA: Insufficient documentation

## 2024-06-29 DIAGNOSIS — Z952 Presence of prosthetic heart valve: Secondary | ICD-10-CM | POA: Diagnosis not present

## 2024-06-29 DIAGNOSIS — I4891 Unspecified atrial fibrillation: Secondary | ICD-10-CM | POA: Diagnosis not present

## 2024-06-29 DIAGNOSIS — I10 Essential (primary) hypertension: Secondary | ICD-10-CM | POA: Diagnosis not present

## 2024-06-29 LAB — COMPREHENSIVE METABOLIC PANEL WITH GFR
ALT: 33 U/L (ref 0–44)
AST: 48 U/L — ABNORMAL HIGH (ref 15–41)
Albumin: 3.6 g/dL (ref 3.5–5.0)
Alkaline Phosphatase: 66 U/L (ref 38–126)
Anion gap: 10 (ref 5–15)
BUN: 20 mg/dL (ref 8–23)
CO2: 21 mmol/L — ABNORMAL LOW (ref 22–32)
Calcium: 9 mg/dL (ref 8.9–10.3)
Chloride: 106 mmol/L (ref 98–111)
Creatinine, Ser: 1.14 mg/dL (ref 0.61–1.24)
GFR, Estimated: >60 mL/min (ref >60)
Glucose, Bld: 154 mg/dL — ABNORMAL HIGH (ref 70–99)
Potassium: 5.4 mmol/L — ABNORMAL HIGH (ref 3.5–5.1)
Sodium: 137 mmol/L (ref 135–145)
Total Bilirubin: 1.5 mg/dL — ABNORMAL HIGH (ref 0.0–1.2)
Total Protein: 6.6 g/dL (ref 6.5–8.1)

## 2024-06-29 LAB — CBC
HCT: 36.4 % — ABNORMAL LOW (ref 39.0–52.0)
Hemoglobin: 12 g/dL — ABNORMAL LOW (ref 13.0–17.0)
MCH: 32.3 pg (ref 26.0–34.0)
MCHC: 33 g/dL (ref 30.0–36.0)
MCV: 97.8 fL (ref 80.0–100.0)
Platelets: 181 K/uL (ref 150–400)
RBC: 3.72 MIL/uL — ABNORMAL LOW (ref 4.22–5.81)
RDW: 15 % (ref 11.5–15.5)
WBC: 6.5 K/uL (ref 4.0–10.5)
nRBC: 0 % (ref 0.0–0.2)

## 2024-06-29 LAB — TYPE AND SCREEN
ABO/RH(D): A POS
Antibody Screen: NEGATIVE

## 2024-06-29 LAB — URINALYSIS, ROUTINE W REFLEX MICROSCOPIC
Bilirubin Urine: NEGATIVE
Glucose, UA: NEGATIVE mg/dL
Hgb urine dipstick: NEGATIVE
Ketones, ur: NEGATIVE mg/dL
Leukocytes,Ua: NEGATIVE
Nitrite: NEGATIVE
Protein, ur: NEGATIVE mg/dL
Specific Gravity, Urine: 1.008 (ref 1.005–1.030)
pH: 6 (ref 5.0–8.0)

## 2024-06-29 LAB — SURGICAL PCR SCREEN
MRSA, PCR: NEGATIVE
Staphylococcus aureus: NEGATIVE

## 2024-06-29 LAB — PROTIME-INR
INR: 1.9 — ABNORMAL HIGH (ref 0.8–1.2)
Prothrombin Time: 22.9 s — ABNORMAL HIGH (ref 11.4–15.2)

## 2024-06-29 NOTE — Progress Notes (Signed)
 Patient signed all consents at PAT lab appointment. CHG soap and instructions were given to patient. CHG surgical prep reviewed with patient and all questions answered.  Patients chart send to anesthesia for review. Pt denies any respiratory illness/infection in the last two months.

## 2024-07-02 MED ORDER — MAGNESIUM SULFATE 50 % IJ SOLN
40.0000 meq | INTRAMUSCULAR | Status: DC
  Filled 2024-07-02: qty 9.85

## 2024-07-02 MED ORDER — HEPARIN 30,000 UNITS/1000 ML (OHS) CELLSAVER SOLUTION
Status: DC
  Filled 2024-07-02: qty 1000

## 2024-07-02 MED ORDER — DEXMEDETOMIDINE HCL IN NACL 400 MCG/100ML IV SOLN
0.1000 ug/kg/h | INTRAVENOUS | Status: AC
  Administered 2024-07-03: .7 ug/kg/h via INTRAVENOUS
  Filled 2024-07-02: qty 100

## 2024-07-02 MED ORDER — POTASSIUM CHLORIDE 2 MEQ/ML IV SOLN
80.0000 meq | INTRAVENOUS | Status: DC
  Filled 2024-07-02: qty 40

## 2024-07-02 MED ORDER — CEFAZOLIN SODIUM-DEXTROSE 2-4 GM/100ML-% IV SOLN
2.0000 g | INTRAVENOUS | Status: AC
  Administered 2024-07-03: 2 g via INTRAVENOUS
  Filled 2024-07-02: qty 100

## 2024-07-02 MED ORDER — NOREPINEPHRINE 4 MG/250ML-% IV SOLN
0.0000 ug/min | INTRAVENOUS | Status: AC
  Administered 2024-07-03: 2 ug/min via INTRAVENOUS
  Filled 2024-07-02: qty 250

## 2024-07-02 NOTE — Anesthesia Preprocedure Evaluation (Signed)
 Anesthesia Evaluation  Patient identified by MRN, date of birth, ID band Patient awake    Reviewed: Allergy & Precautions, NPO status , Patient's Chart, lab work & pertinent test results, reviewed documented beta blocker date and time   History of Anesthesia Complications Negative for: history of anesthetic complications  Airway Mallampati: I  TM Distance: >3 FB Neck ROM: Full    Dental  (+) Edentulous Upper, Dental Advisory Given, Poor Dentition   Pulmonary neg recent URI   breath sounds clear to auscultation       Cardiovascular hypertension, + CAD and + Cardiac Stents  (-) CHF + dysrhythmias Atrial Fibrillation + Valvular Problems/Murmurs AS  Rhythm:Regular Rate:Normal     Neuro/Psych    GI/Hepatic   Endo/Other  diabetesHypothyroidism    Renal/GU      Musculoskeletal   Abdominal   Peds  Hematology   Anesthesia Other Findings   Reproductive/Obstetrics                              Anesthesia Physical Anesthesia Plan  ASA: 4  Anesthesia Plan: MAC   Post-op Pain Management:    Induction: Intravenous  PONV Risk Score and Plan: 1  Airway Management Planned: Simple Face Mask  Additional Equipment: Arterial line  Intra-op Plan:   Post-operative Plan:   Informed Consent:      Dental advisory given  Plan Discussed with: CRNA and Surgeon  Anesthesia Plan Comments: (Anesthetic Hx: EGD (2023): MAC, no AC  TTE (12/19/23): LVEF 60-65%, mild to moderate MR, low flow, low gradient severe AS with mean gradient 25 mmHg, AVA 0.58 cm2, DI 0.2, SVI 86  89 year old male with PMH of HTN, HLD, RBBB, hypothyroidism, CAD s/p PCI, pAF, severe aortic stenosis - scheduled for TAVR. Of note, recent tooth removal (>2 weeks ago) with no residual antibiotics or infection.Type and screen ordered. INR 1.3 preoperative draw/review. Plan for MAC with 2-large bore PIV's and arterial access via  Cardiology. )         Anesthesia Quick Evaluation

## 2024-07-03 ENCOUNTER — Encounter (HOSPITAL_COMMUNITY): Payer: Self-pay | Admitting: Cardiovascular Disease

## 2024-07-03 ENCOUNTER — Inpatient Hospital Stay (HOSPITAL_COMMUNITY): Payer: Self-pay | Admitting: Certified Registered Nurse Anesthetist

## 2024-07-03 ENCOUNTER — Other Ambulatory Visit (HOSPITAL_COMMUNITY): Payer: Self-pay

## 2024-07-03 ENCOUNTER — Inpatient Hospital Stay (HOSPITAL_COMMUNITY): Payer: Self-pay | Admitting: Physician Assistant

## 2024-07-03 ENCOUNTER — Encounter (HOSPITAL_COMMUNITY)
Admission: RE | Disposition: A | Discharge status: Home / Self Care | Payer: Self-pay | Source: Home / Self Care | Attending: Cardiovascular Disease

## 2024-07-03 ENCOUNTER — Inpatient Hospital Stay (HOSPITAL_COMMUNITY)

## 2024-07-03 ENCOUNTER — Inpatient Hospital Stay (HOSPITAL_COMMUNITY)
Admission: RE | Admit: 2024-07-03 | Discharge: 2024-07-05 | DRG: 266 | Disposition: A | Discharge status: Home / Self Care | Attending: Cardiovascular Disease | Admitting: Cardiovascular Disease

## 2024-07-03 ENCOUNTER — Other Ambulatory Visit: Payer: Self-pay

## 2024-07-03 DIAGNOSIS — E039 Hypothyroidism, unspecified: Secondary | ICD-10-CM | POA: Diagnosis not present

## 2024-07-03 DIAGNOSIS — I4821 Permanent atrial fibrillation: Secondary | ICD-10-CM | POA: Diagnosis not present

## 2024-07-03 DIAGNOSIS — R918 Other nonspecific abnormal finding of lung field: Secondary | ICD-10-CM | POA: Diagnosis not present

## 2024-07-03 DIAGNOSIS — Z79899 Other long term (current) drug therapy: Secondary | ICD-10-CM | POA: Diagnosis not present

## 2024-07-03 DIAGNOSIS — I350 Nonrheumatic aortic (valve) stenosis: Secondary | ICD-10-CM

## 2024-07-03 DIAGNOSIS — J189 Pneumonia, unspecified organism: Secondary | ICD-10-CM | POA: Diagnosis not present

## 2024-07-03 DIAGNOSIS — Z8249 Family history of ischemic heart disease and other diseases of the circulatory system: Secondary | ICD-10-CM

## 2024-07-03 DIAGNOSIS — Z860101 Personal history of adenomatous and serrated colon polyps: Secondary | ICD-10-CM

## 2024-07-03 DIAGNOSIS — Z955 Presence of coronary angioplasty implant and graft: Secondary | ICD-10-CM

## 2024-07-03 DIAGNOSIS — Z7984 Long term (current) use of oral hypoglycemic drugs: Secondary | ICD-10-CM

## 2024-07-03 DIAGNOSIS — M1711 Unilateral primary osteoarthritis, right knee: Secondary | ICD-10-CM | POA: Diagnosis present

## 2024-07-03 DIAGNOSIS — E1169 Type 2 diabetes mellitus with other specified complication: Secondary | ICD-10-CM | POA: Diagnosis not present

## 2024-07-03 DIAGNOSIS — H409 Unspecified glaucoma: Secondary | ICD-10-CM | POA: Diagnosis present

## 2024-07-03 DIAGNOSIS — I4510 Unspecified right bundle-branch block: Secondary | ICD-10-CM

## 2024-07-03 DIAGNOSIS — E785 Hyperlipidemia, unspecified: Secondary | ICD-10-CM | POA: Diagnosis present

## 2024-07-03 DIAGNOSIS — Z7901 Long term (current) use of anticoagulants: Secondary | ICD-10-CM

## 2024-07-03 DIAGNOSIS — E119 Type 2 diabetes mellitus without complications: Secondary | ICD-10-CM

## 2024-07-03 DIAGNOSIS — E669 Obesity, unspecified: Secondary | ICD-10-CM | POA: Diagnosis present

## 2024-07-03 DIAGNOSIS — R001 Bradycardia, unspecified: Secondary | ICD-10-CM | POA: Diagnosis not present

## 2024-07-03 DIAGNOSIS — Z6830 Body mass index (BMI) 30.0-30.9, adult: Secondary | ICD-10-CM

## 2024-07-03 DIAGNOSIS — I442 Atrioventricular block, complete: Secondary | ICD-10-CM | POA: Insufficient documentation

## 2024-07-03 DIAGNOSIS — I2510 Atherosclerotic heart disease of native coronary artery without angina pectoris: Secondary | ICD-10-CM | POA: Diagnosis present

## 2024-07-03 DIAGNOSIS — M19042 Primary osteoarthritis, left hand: Secondary | ICD-10-CM | POA: Diagnosis present

## 2024-07-03 DIAGNOSIS — Z950 Presence of cardiac pacemaker: Secondary | ICD-10-CM | POA: Insufficient documentation

## 2024-07-03 DIAGNOSIS — I2583 Coronary atherosclerosis due to lipid rich plaque: Secondary | ICD-10-CM | POA: Diagnosis not present

## 2024-07-03 DIAGNOSIS — Z952 Presence of prosthetic heart valve: Secondary | ICD-10-CM

## 2024-07-03 DIAGNOSIS — Z79890 Hormone replacement therapy (postmenopausal): Secondary | ICD-10-CM | POA: Diagnosis not present

## 2024-07-03 DIAGNOSIS — Z8616 Personal history of COVID-19: Secondary | ICD-10-CM | POA: Diagnosis not present

## 2024-07-03 DIAGNOSIS — Z006 Encounter for examination for normal comparison and control in clinical research program: Secondary | ICD-10-CM | POA: Diagnosis not present

## 2024-07-03 DIAGNOSIS — Z880 Allergy status to penicillin: Secondary | ICD-10-CM

## 2024-07-03 DIAGNOSIS — Z947 Corneal transplant status: Secondary | ICD-10-CM

## 2024-07-03 DIAGNOSIS — Z881 Allergy status to other antibiotic agents status: Secondary | ICD-10-CM | POA: Diagnosis not present

## 2024-07-03 DIAGNOSIS — I10 Essential (primary) hypertension: Secondary | ICD-10-CM | POA: Diagnosis not present

## 2024-07-03 DIAGNOSIS — I700 Atherosclerosis of aorta: Secondary | ICD-10-CM | POA: Diagnosis not present

## 2024-07-03 HISTORY — PX: TEMPORARY PACEMAKER: CATH118268

## 2024-07-03 HISTORY — PX: INTRAOPERATIVE TRANSTHORACIC ECHOCARDIOGRAM: SHX6523

## 2024-07-03 LAB — ECHOCARDIOGRAM LIMITED
AR max vel: 2.35 cm2
AV Area VTI: 2.25 cm2
AV Area mean vel: 2.56 cm2
AV Mean grad: 4 mmHg
AV Peak grad: 6.9 mmHg
Ao pk vel: 1.31 m/s

## 2024-07-03 LAB — POCT I-STAT, CHEM 8
BUN: 21 mg/dL (ref 8–23)
Calcium, Ion: 1.18 mmol/L (ref 1.15–1.40)
Chloride: 100 mmol/L (ref 98–111)
Creatinine, Ser: 1.1 mg/dL (ref 0.61–1.24)
Glucose, Bld: 168 mg/dL — ABNORMAL HIGH (ref 70–99)
HCT: 29 % — ABNORMAL LOW (ref 39.0–52.0)
Hemoglobin: 9.9 g/dL — ABNORMAL LOW (ref 13.0–17.0)
Potassium: 3.9 mmol/L (ref 3.5–5.1)
Sodium: 139 mmol/L (ref 135–145)
TCO2: 26 mmol/L (ref 22–32)

## 2024-07-03 LAB — GLUCOSE, CAPILLARY
Glucose-Capillary: 109 mg/dL — ABNORMAL HIGH (ref 70–99)
Glucose-Capillary: 129 mg/dL — ABNORMAL HIGH (ref 70–99)
Glucose-Capillary: 181 mg/dL — ABNORMAL HIGH (ref 70–99)
Glucose-Capillary: 169 mg/dL — ABNORMAL HIGH (ref 70–99)

## 2024-07-03 LAB — POCT ACTIVATED CLOTTING TIME: Activated Clotting Time: 348 s

## 2024-07-03 LAB — BASIC METABOLIC PANEL WITH GFR
Anion gap: 12 (ref 5–15)
BUN: 21 mg/dL (ref 8–23)
CO2: 24 mmol/L (ref 22–32)
Calcium: 8.4 mg/dL — ABNORMAL LOW (ref 8.9–10.3)
Chloride: 102 mmol/L (ref 98–111)
Creatinine, Ser: 1.18 mg/dL (ref 0.61–1.24)
GFR, Estimated: 59 mL/min — ABNORMAL LOW (ref >60)
Glucose, Bld: 105 mg/dL — ABNORMAL HIGH (ref 70–99)
Potassium: 3.6 mmol/L (ref 3.5–5.1)
Sodium: 138 mmol/L (ref 135–145)

## 2024-07-03 LAB — ABO/RH: ABO/RH(D): A POS

## 2024-07-03 LAB — PROTIME-INR
INR: 1.3 — ABNORMAL HIGH (ref 0.8–1.2)
Prothrombin Time: 16.9 s — ABNORMAL HIGH (ref 11.4–15.2)

## 2024-07-03 SURGERY — TRANSCATHETER AORTIC VALVE REPLACEMENT, TRANSFEMORAL (CATHLAB)
Anesthesia: Monitor Anesthesia Care

## 2024-07-03 SURGERY — TEMPORARY PACEMAKER
Anesthesia: LOCAL

## 2024-07-03 MED ORDER — CHLORHEXIDINE GLUCONATE 0.12 % MT SOLN: 15.0000 mL | Freq: Once | OROMUCOSAL | Status: AC

## 2024-07-03 MED ORDER — CHLORHEXIDINE GLUCONATE 4 % EX SOLN: 30.0000 mL | CUTANEOUS | Status: DC

## 2024-07-03 MED ORDER — SODIUM CHLORIDE 0.9 % IV SOLN
250.0000 mL | INTRAVENOUS | Status: DC | PRN
  Administered 2024-07-04: 250 mL via INTRAVENOUS

## 2024-07-03 MED ORDER — SODIUM CHLORIDE 0.9% FLUSH: 3.0000 mL | INTRAVENOUS | Status: DC | PRN

## 2024-07-03 MED ORDER — HEPARIN (PORCINE) IN NACL 1000-0.9 UT/500ML-% IV SOLN
INTRAVENOUS | Status: DC | PRN
  Administered 2024-07-03 (×5): 500 mL

## 2024-07-03 MED ORDER — ATORVASTATIN CALCIUM 40 MG PO TABS
40.0000 mg | ORAL_TABLET | Freq: Every day | ORAL | Status: DC
  Administered 2024-07-03 – 2024-07-04 (×2): 40 mg via ORAL
  Filled 2024-07-03 (×2): qty 1

## 2024-07-03 MED ORDER — LATANOPROST 0.005 % OP SOLN
1.0000 [drp] | Freq: Every day | OPHTHALMIC | Status: DC
  Administered 2024-07-03 – 2024-07-04 (×2): 1 [drp] via OPHTHALMIC
  Filled 2024-07-03: qty 2.5

## 2024-07-03 MED ORDER — SODIUM CHLORIDE 0.9 % IV SOLN: INTRAVENOUS | Status: DC

## 2024-07-03 MED ORDER — PROTAMINE SULFATE 10 MG/ML IV SOLN
INTRAVENOUS | Status: DC | PRN
  Administered 2024-07-03: 140 mg via INTRAVENOUS

## 2024-07-03 MED ORDER — TRAMADOL HCL 50 MG PO TABS: 50.0000 mg | ORAL_TABLET | ORAL | Status: DC | PRN

## 2024-07-03 MED ORDER — FENTANYL CITRATE (PF) 100 MCG/2ML IJ SOLN
INTRAMUSCULAR | Status: DC | PRN
  Administered 2024-07-03: 25 ug via INTRAVENOUS
  Administered 2024-07-03: 50 ug via INTRAVENOUS

## 2024-07-03 MED ORDER — LEVOTHYROXINE SODIUM 75 MCG PO TABS
75.0000 ug | ORAL_TABLET | Freq: Every day | ORAL | Status: DC
  Administered 2024-07-04 – 2024-07-05 (×2): 75 ug via ORAL
  Filled 2024-07-03 (×2): qty 1

## 2024-07-03 MED ORDER — CHLORHEXIDINE GLUCONATE 4 % EX SOLN: 60.0000 mL | Freq: Once | CUTANEOUS | Status: DC

## 2024-07-03 MED ORDER — NOREPINEPHRINE 4 MG/250ML-% IV SOLN: 0.0000 ug/min | INTRAVENOUS | Status: DC

## 2024-07-03 MED ORDER — OXYCODONE HCL 5 MG PO TABS: 5.0000 mg | ORAL_TABLET | Freq: Once | ORAL | Status: DC | PRN

## 2024-07-03 MED ORDER — NOREPINEPHRINE BITARTRATE 1 MG/ML IV SOLN
INTRAVENOUS | Status: DC | PRN
  Administered 2024-07-03 (×6): .5 mL via INTRAVENOUS
  Administered 2024-07-03 (×2): 1 mL via INTRAVENOUS
  Administered 2024-07-03: .5 mL via INTRAVENOUS

## 2024-07-03 MED ORDER — SODIUM CHLORIDE 0.9 % IV SOLN: INTRAVENOUS | Status: AC

## 2024-07-03 MED ORDER — FENTANYL CITRATE (PF) 100 MCG/2ML IJ SOLN: 25.0000 ug | INTRAMUSCULAR | Status: DC | PRN

## 2024-07-03 MED ORDER — PROPOFOL 500 MG/50ML IV EMUL
INTRAVENOUS | Status: DC | PRN
  Administered 2024-07-03: 20 mg via INTRAVENOUS
  Administered 2024-07-03: 30 ug/kg/min via INTRAVENOUS

## 2024-07-03 MED ORDER — LACTATED RINGERS IV SOLN
INTRAVENOUS | Status: DC
Start: 2024-07-03 — End: 2024-07-03

## 2024-07-03 MED ORDER — DROPERIDOL 2.5 MG/ML IJ SOLN: 0.6250 mg | Freq: Once | INTRAMUSCULAR | Status: DC | PRN

## 2024-07-03 MED ORDER — OXYCODONE HCL 5 MG/5ML PO SOLN: 5.0000 mg | Freq: Once | ORAL | Status: DC | PRN

## 2024-07-03 MED ORDER — IODIXANOL 320 MG/ML IV SOLN
INTRAVENOUS | Status: DC | PRN
  Administered 2024-07-03: 40 mL via INTRA_ARTERIAL

## 2024-07-03 MED ORDER — TAMSULOSIN HCL 0.4 MG PO CAPS
0.4000 mg | ORAL_CAPSULE | Freq: Every day | ORAL | Status: DC
  Administered 2024-07-03: 0.4 mg via ORAL
  Filled 2024-07-03: qty 1

## 2024-07-03 MED ORDER — INSULIN ASPART 100 UNIT/ML IJ SOLN
0.0000 [IU] | Freq: Three times a day (TID) | INTRAMUSCULAR | Status: DC
  Administered 2024-07-03: 2 [IU] via SUBCUTANEOUS
  Administered 2024-07-03 (×2): 4 [IU] via SUBCUTANEOUS
  Administered 2024-07-04: 8 [IU] via SUBCUTANEOUS
  Administered 2024-07-04 – 2024-07-05 (×4): 2 [IU] via SUBCUTANEOUS
  Administered 2024-07-05: 4 [IU] via SUBCUTANEOUS

## 2024-07-03 MED ORDER — MORPHINE SULFATE (PF) 2 MG/ML IV SOLN: 1.0000 mg | INTRAVENOUS | Status: DC | PRN

## 2024-07-03 MED ORDER — OXYCODONE HCL 5 MG PO TABS: 5.0000 mg | ORAL_TABLET | ORAL | Status: DC | PRN

## 2024-07-03 MED ORDER — CEFAZOLIN SODIUM-DEXTROSE 2-4 GM/100ML-% IV SOLN
2.0000 g | Freq: Three times a day (TID) | INTRAVENOUS | Status: AC
  Administered 2024-07-03 (×2): 2 g via INTRAVENOUS
  Filled 2024-07-03 (×2): qty 100

## 2024-07-03 MED ORDER — ACETAMINOPHEN 10 MG/ML IV SOLN: 1000.0000 mg | Freq: Once | INTRAVENOUS | Status: DC | PRN

## 2024-07-03 MED ORDER — NITROGLYCERIN IN D5W 200-5 MCG/ML-% IV SOLN: 0.0000 ug/min | INTRAVENOUS | Status: DC

## 2024-07-03 MED ORDER — LIDOCAINE HCL (PF) 1 % IJ SOLN
INTRAMUSCULAR | Status: AC
  Filled 2024-07-03: qty 30

## 2024-07-03 MED ORDER — ONDANSETRON HCL 4 MG/2ML IJ SOLN
INTRAMUSCULAR | Status: DC | PRN
  Administered 2024-07-03: 4 mg via INTRAVENOUS

## 2024-07-03 MED ORDER — ONDANSETRON HCL 4 MG/2ML IJ SOLN: 4.0000 mg | Freq: Four times a day (QID) | INTRAMUSCULAR | Status: DC | PRN

## 2024-07-03 MED ORDER — ONDANSETRON HCL 4 MG/2ML IJ SOLN: 4.0000 mg | Freq: Once | INTRAMUSCULAR | Status: DC | PRN

## 2024-07-03 MED ORDER — SODIUM CHLORIDE 0.9% FLUSH
3.0000 mL | Freq: Two times a day (BID) | INTRAVENOUS | Status: DC
  Administered 2024-07-03 – 2024-07-05 (×3): 3 mL via INTRAVENOUS

## 2024-07-03 MED ORDER — PROPOFOL 1000 MG/100ML IV EMUL
INTRAVENOUS | Status: AC
  Filled 2024-07-03: qty 600

## 2024-07-03 MED ORDER — LIDOCAINE HCL (PF) 1 % IJ SOLN
INTRAMUSCULAR | Status: DC | PRN
  Administered 2024-07-03: 2 mL
  Administered 2024-07-03 (×2): 5 mL

## 2024-07-03 MED ORDER — CHLORHEXIDINE GLUCONATE 0.12 % MT SOLN
OROMUCOSAL | Status: AC
  Administered 2024-07-03: 15 mL via OROMUCOSAL
  Filled 2024-07-03: qty 15

## 2024-07-03 MED ORDER — ACETAMINOPHEN 650 MG RE SUPP: 650.0000 mg | Freq: Four times a day (QID) | RECTAL | Status: DC | PRN

## 2024-07-03 MED ORDER — ACETAMINOPHEN 325 MG PO TABS
650.0000 mg | ORAL_TABLET | Freq: Four times a day (QID) | ORAL | Status: DC | PRN
  Administered 2024-07-04 – 2024-07-05 (×2): 650 mg via ORAL
  Filled 2024-07-03 (×2): qty 2

## 2024-07-03 MED ORDER — ALLOPURINOL 100 MG PO TABS
100.0000 mg | ORAL_TABLET | Freq: Every day | ORAL | Status: DC
  Administered 2024-07-03: 100 mg via ORAL
  Filled 2024-07-03: qty 1

## 2024-07-03 MED ORDER — FENTANYL CITRATE (PF) 100 MCG/2ML IJ SOLN
INTRAMUSCULAR | Status: AC
  Filled 2024-07-03: qty 2

## 2024-07-03 MED ORDER — LORATADINE 10 MG PO TABS: 10.0000 mg | ORAL_TABLET | Freq: Every day | ORAL | Status: DC

## 2024-07-03 MED ORDER — HEPARIN SODIUM (PORCINE) 1000 UNIT/ML IJ SOLN
INTRAMUSCULAR | Status: DC | PRN
  Administered 2024-07-03: 14000 [IU] via INTRAVENOUS

## 2024-07-03 SURGICAL SUPPLY — 27 items
BAG SNAP BAND KOVER 36X36 (MISCELLANEOUS) ×4 IMPLANT
CABLE ADAPT PACING TEMP 12FT (ADAPTER) IMPLANT
CATH 26 ULTRA DELIVERY (CATHETERS) IMPLANT
CATH DIAG 6FR PIGTAIL ANGLED (CATHETERS) IMPLANT
CATH INFINITI 5FR ANG PIGTAIL (CATHETERS) IMPLANT
CATH INFINITI 6F AL1 (CATHETERS) IMPLANT
CLOSURE MYNX CONTROL 6F/7F (Vascular Products) IMPLANT
CRIMPER (MISCELLANEOUS) IMPLANT
DEVICE INFLATION ATRION QL2530 (MISCELLANEOUS) IMPLANT
ELECT DEFIB PAD ADLT CADENCE (PAD) IMPLANT
KIT MICROPUNCTURE NIT STIFF (SHEATH) IMPLANT
KIT SAPIAN 3 ULTRA RESILIA 26 (Valve) IMPLANT
KIT SYRINGE INJ CVI SPIKEX1 (MISCELLANEOUS) IMPLANT
PACK CARDIAC CATHETERIZATION (CUSTOM PROCEDURE TRAY) ×2 IMPLANT
SET ATX-X65L (MISCELLANEOUS) IMPLANT
SHEATH INTRODUCER SET 20-26 (SHEATH) IMPLANT
SHEATH PINNACLE 6F 10CM (SHEATH) IMPLANT
SHEATH PINNACLE 8F 10CM (SHEATH) IMPLANT
SHIELD CATH-GARD CONTAMINATION (MISCELLANEOUS) IMPLANT
STOPCOCK MORSE 400PSI 3WAY (MISCELLANEOUS) ×4 IMPLANT
TRANSDUCER W/STOPCOCK (MISCELLANEOUS) IMPLANT
TUBING ART PRESS 72 MALE/FEM (TUBING) IMPLANT
WIRE AMPLATZ SS-J .035X180CM (WIRE) IMPLANT
WIRE EMERALD 3MM-J .035X150CM (WIRE) IMPLANT
WIRE EMERALD 3MM-J .035X260CM (WIRE) IMPLANT
WIRE PACING TEMP ST TIP 5 (CATHETERS) IMPLANT
WIRE SAFARI SM CURVE 275 (WIRE) IMPLANT

## 2024-07-03 NOTE — H&P (Signed)
 [address]       [place] [address]             [phone]                                        Marvin Foster [facility] Medical Record #[id number] Date of Birth: [birth year]   Referring: [name]* Primary Care: [name] BRAVO, MD Primary Cardiologist:[name], MD   Chief Complaint:        Chief Complaint  Patient presents with   Aortic Stenosis      TAVR consult, review all studies      History of Present Illness:     Marvin Foster is a 89 y.o. male presents for surgical evaluation of severe aortic stenosis.  He occasionally has some dizziness, and will get short of breath when walking up inclines.               Past Medical History:  Diagnosis Date   Acute respiratory failure due to COVID-19 [facility]) 07/07/2020   ANEMIA 02/18/2010    Qualifier: Diagnosis of  By: Mahlon MD, Comer     Arthritis 12/2005    left hand   Arthritis      Left knee   ASPARTATE AMINOTRANSFERASE, SERUM, ELEVATED 11/04/2008    Qualifier: Diagnosis of  By: Mahlon MD, Katherine     Atrial fibrillation (HCC) 07/24/2013   Blood transfusion without reported diagnosis     Bursitis of knee      right knee   CAD (coronary artery disease)     Cardiac arrhythmia due to congenital heart disease     Chronic anticoagulation 06/12/2015   Coagulation defect (HCC) 05/28/2020   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   CORONARY ARTERY DISEASE 03/27/2007    Qualifier: Diagnosis of  By: Tita MD, Luis     Diabetes mellitus     Diverticulosis     Glaucoma      left eye   Hx of colonic polyps 06/12/2015   Hyperlipidemia     Hypertension     Hypothyroid 09/21/2018   Long term current use of anticoagulant therapy 07/24/2013   Neuropathy      with pain   Pain due to onychomycosis of toenail of left foot 05/28/2020   Permanent atrial fibrillation (HCC)     RBBB     Scrotal mass 03/07/2012   Spinal stenosis  01/20/2005   Tubular adenoma of colon                 Past Surgical History:  Procedure Laterality Date   BACK SURGERY       BIOPSY   09/16/2022    Procedure: BIOPSY;  Surgeon: [name], MD;  Location: [facility];  Service: Gastroenterology;;   CATARACT EXTRACTION   08/11/2009    right   COLONOSCOPY WITH PROPOFOL  N/A 09/16/2022    Procedure: COLONOSCOPY WITH PROPOFOL ;  Surgeon: [name], MD;  Location: [facility];  Service: Gastroenterology;  Laterality: N/A;   corneal endothelial Left 2013   CORNEAL TRANSPLANT Left      eye   CORONARY ANGIOGRAPHY N/A 05/16/2024    Procedure: CORONARY ANGIOGRAPHY;  Surgeon: [name], MD;  Location: [facility];  Service: Cardiovascular;  Laterality: N/A;   CORONARY ANGIOPLASTY WITH STENT PLACEMENT  ESOPHAGOGASTRODUODENOSCOPY (EGD) WITH PROPOFOL  N/A 09/16/2022    Procedure: ESOPHAGOGASTRODUODENOSCOPY (EGD) WITH PROPOFOL ;  Surgeon: [name], MD;  Location: [facility];  Service: Gastroenterology;  Laterality: N/A;   EYE SURGERY       HOT HEMOSTASIS N/A 09/16/2022    Procedure: HOT HEMOSTASIS (ARGON PLASMA COAGULATION/BICAP);  Surgeon: [name], MD;  Location: [facility];  Service: Gastroenterology;  Laterality: N/A;   POLYPECTOMY   09/16/2022    Procedure: POLYPECTOMY;  Surgeon: [name], MD;  Location: [facility];  Service: Gastroenterology;;   ROTATOR CUFF REPAIR       SMALL INTESTINE SURGERY       SPINE SURGERY              Social History:   Tobacco Use History  Social History       Tobacco Use  Smoking Status Never  Smokeless Tobacco Never      Social History        Substance and Sexual Activity  Alcohol Use Not Currently    Comment: Very seldom---socially        Allergies       Allergies  Allergen Reactions   Neomycin-Bacitracin Zn-Polymyx Rash   Penicillins Other (See Comments) and Rash      Pt states this was diagnosed years ago, unsure as to  reaction. Will not use.    Enalapril         Stopped 11/2020 due to hyperkalemia due to this   Miconazole Nitrate        Unsure of reaction    Sulfa Antibiotics        Pt not aware of allergy                   Current Outpatient Medications  Medication Sig Dispense Refill   allopurinol  (ZYLOPRIM ) 100 MG tablet Take 1 tablet (100 mg total) by mouth daily. 90 tablet 1   amLODipine  (NORVASC ) 2.5 MG tablet Take 1 tablet by mouth once daily 90 tablet 2   atorvastatin  (LIPITOR) 40 MG tablet TAKE 1 TABLET BY MOUTH AT BEDTIME 90 tablet 0   BESIVANCE 0.6 % SUSP Place 1 drop into the left eye 4 (four) times daily. Pt use drop after each monthly eye injection.       Calcium  Carbonate-Vitamin D (CALCIUM  600 + D PO) Take 1 tablet by mouth 2 (two) times daily.       cetirizine  (ZYRTEC ) 10 MG tablet Take 1 tablet by mouth once daily 90 tablet 0   diclofenac  Sodium (VOLTAREN ) 1 % GEL Apply 2 g topically 4 (four) times daily. 100 g 1   ferrous sulfate 325 (65 FE) MG tablet Take 325 mg by mouth daily.       fluorometholone  (FML) 0.1 % ophthalmic suspension Place 1 drop into both eyes every 30 (thirty) days.        furosemide  (LASIX ) 40 MG tablet Take 1 tablet by mouth once daily 90 tablet 0   Lancets (ONETOUCH ULTRASOFT) lancets Use as instructed 100 each 12   latanoprost  (XALATAN ) 0.005 % ophthalmic solution SMARTSIG:In Eye(s)       levothyroxine  (SYNTHROID ) 75 MCG tablet Take 1 tablet (75 mcg total) by mouth daily. 90 tablet 0   magnesium  oxide (MAGOX 400) 400 (241.3 Mg) MG tablet Take 1 tablet (400 mg total) by mouth 2 (two) times daily. 60 tablet 11   metFORMIN  (GLUCOPHAGE ) 1000 MG tablet Take 1 tablet by mouth twice daily 180 tablet 0  metoprolol  tartrate (LOPRESSOR ) 100 MG tablet Take 1 tablet by mouth twice daily 180 tablet 0   Multiple Vitamin (MULTIVITAMIN) capsule Take 1 capsule by mouth daily.       Omega-3 Fatty Acids (FISH OIL) 1200 MG CAPS Take 3 capsules by mouth daily.       ONETOUCH  VERIO test strip USE 1 STRIP TO CHECK GLUCOSE TWICE DAILY AS DIRECTED 100 each 0   tamsulosin  (FLOMAX ) 0.4 MG CAPS capsule Take 1 capsule (0.4 mg total) by mouth daily. 90 capsule 1   timolol  (TIMOPTIC ) 0.5 % ophthalmic solution Place 1 drop into both eyes daily.        tolterodine  (DETROL ) 1 MG tablet Take 1 tablet (1 mg total) by mouth 2 (two) times daily. 60 tablet 3   warfarin (COUMADIN ) 2.5 MG tablet TAKE 1/2 TO 1 (ONE-HALF TO ONE) TABLET BY MOUTH ONCE DAILY OR  AS  DIRECTED  BY  ANTICOAGULATION  CLINIC 100 tablet 1      No current facility-administered medications for this visit.         (Not in a hospital admission)              Family History  Problem Relation Age of Onset   Heart failure Mother          Heart attack age 59   Heart failure Father          Heart attack age 37   Stomach cancer Neg Hx     Colon cancer Neg Hx     Pancreatic cancer Neg Hx     Throat cancer Neg Hx              Review of Systems:    Review of Systems  Constitutional:  Positive for malaise/fatigue.  Respiratory:  Negative for shortness of breath.   Cardiovascular:  Negative for chest pain.  Neurological:  Positive for dizziness.                  Physical Exam: BP 115/76 (BP Location: Right Arm, Patient Position: Sitting, Cuff Size: Normal)   Pulse 98   Resp 20   Ht 5' 9 (1.753 m)   Wt 204 lb 8 oz (92.8 kg)   SpO2 98% Comment: RA  BMI 30.20 kg/m  Physical Exam Constitutional:      General: He is not in acute distress.    Appearance: He is not ill-appearing.  HENT:     Head: Normocephalic and atraumatic.  Eyes:     Extraocular Movements: Extraocular movements intact.  Cardiovascular:     Rate and Rhythm: Normal rate.  Pulmonary:     Effort: Pulmonary effort is normal. No respiratory distress.  Abdominal:     General: Abdomen is flat. There is no distension.  Musculoskeletal:        General: Normal range of motion.     Cervical back: Normal range of motion.  Skin:     General: Skin is warm and dry.  Neurological:     General: No focal deficit present.     Mental Status: He is alert and oriented to person, place, and time.         Cardiac Studies & Procedures Objective  ______________________________________________________________________________________________ CARDIAC CATHETERIZATION   CARDIAC CATHETERIZATION 05/16/2024   Conclusion   Prox RCA lesion is 20% stenosed.   Prox Cx to Mid Cx lesion is 20% stenosed.   Prox LAD to Mid LAD lesion is 20% stenosed.   Mild non-obstructive  CAD   Recommendations: Continue workup for TAVR   Findings Coronary Findings Diagnostic  Dominance: Right   Left Anterior Descending Vessel is large. Prox LAD to Mid LAD lesion is 20% stenosed.   Left Circumflex Vessel is large. Prox Cx to Mid Cx lesion is 20% stenosed.   Right Coronary Artery Vessel is large. Prox RCA lesion is 20% stenosed. The lesion is calcified.   Intervention   No interventions have been documented.       ECHOCARDIOGRAM   ECHOCARDIOGRAM COMPLETE 12/19/2023   Narrative ECHOCARDIOGRAM REPORT       Patient Name:   Marvin Foster Date of Exam: 12/19/2023 Medical Rec #:  [id number]       Height:       67.0 in Accession #:    [id number]      Weight:       210.0 lb Date of Birth:  02-06-1934       BSA:          2.065 m Patient Age:    89 years        BP:           108/66 mmHg Patient Gender: M               HR:           50 bpm. Exam Location:  [address]   Procedure: 2D Echo (Both Spectral and Color Flow Doppler were utilized during procedure).   Indications:    I35.0 Nonrheumatic aortic (valve) stenosis   History:        Patient has prior history of Echocardiogram examinations, most recent 06/16/2023. CAD, Arrythmias:RBBB; Risk Factors:Hypertension, Diabetes and Dyslipidemia. Hypothyroidism.   Sonographer:    Jon Hacker RCS Referring Phys: KATHERINE E TABORI   IMPRESSIONS     1. Left ventricular ejection  fraction, by estimation, is 60 to 65%. The left ventricle has normal function. There is mild left ventricular hypertrophy. Left ventricular diastolic parameters are indeterminate. 2. Right ventricular systolic function is low normal. The right ventricular size is normal. 3. Left atrial size was mildly dilated. 4. Right atrial size was mildly dilated. 5. Mild to moderate mitral valve regurgitation. 6. AV is thickened, calcified with restricted motion. Diffiuclt to discern individual leaflets. Peak and mean gradients through the valve are 41 and 25 mm Hg respectively. AVA (by VTI) is 0.58 cm2. DVI is 0.2 Overall consistent with low flow, severe AS. (SVI is 26). SABRA Aortic valve regurgitation is not visualized. 7. Aortic dilatation noted. There is mild dilatation of the ascending aorta, measuring 41 mm. 8. The inferior vena cava is normal in size with greater than 50% respiratory variability, suggesting right atrial pressure of 3 mmHg.   FINDINGS Left Ventricle: Left ventricular ejection fraction, by estimation, is 60 to 65%. The left ventricle has normal function. The left ventricular internal cavity size was normal in size. There is mild left ventricular hypertrophy. Left ventricular diastolic parameters are indeterminate.   Right Ventricle: The right ventricular size is normal. Right vetricular wall thickness was not assessed. Right ventricular systolic function is low normal.   Left Atrium: Left atrial size was mildly dilated.   Right Atrium: Right atrial size was mildly dilated.   Pericardium: There is no evidence of pericardial effusion.   Mitral Valve: There is mild thickening of the mitral valve leaflet(s). There is mild calcification of the mitral valve leaflet(s). Mild mitral annular calcification. Mild to moderate mitral valve regurgitation.   Tricuspid Valve:  The tricuspid valve is normal in structure. Tricuspid valve regurgitation is mild.   Aortic Valve: AV is thickened, calcified  with restricted motion. Diffiuclt to discern individual leaflets. Peak and mean gradients through the valve are 41 and 25 mm Hg respectively. AVA (by VTI) is 0.58 cm2. DVI is 0.2 Overall consistent with low flow, severe AS. (SVI is 26). Aortic valve regurgitation is not visualized. Aortic valve mean gradient measures 27.0 mmHg. Aortic valve peak gradient measures 48.2 mmHg. Aortic valve area, by VTI measures 0.62 cm.   Pulmonic Valve: The pulmonic valve was grossly normal. Pulmonic valve regurgitation is not visualized.   Aorta: The aortic root is normal in size and structure and aortic dilatation noted. There is mild dilatation of the ascending aorta, measuring 41 mm.   Venous: The inferior vena cava is normal in size with greater than 50% respiratory variability, suggesting right atrial pressure of 3 mmHg.   IAS/Shunts: No atrial level shunt detected by color flow Doppler.     LEFT VENTRICLE PLAX 2D LVIDd:         5.00 cm LVIDs:         2.90 cm LV PW:         1.00 cm LV IVS:        1.10 cm LVOT diam:     2.00 cm LV SV:         54 LV SV Index:   26 LVOT Area:     3.14 cm     RIGHT VENTRICLE RV S prime:     9.57 cm/s TAPSE (M-mode): 1.2 cm RVSP:           24.9 mmHg   LEFT ATRIUM             Index        RIGHT ATRIUM           Index LA diam:        5.20 cm 2.52 cm/m   RA Pressure: 3.00 mmHg LA Vol (A2C):   76.9 ml 37.25 ml/m  RA Area:     19.60 cm LA Vol (A4C):   60.1 ml 29.11 ml/m  RA Volume:   59.00 ml  28.58 ml/m LA Biplane Vol: 70.1 ml 33.95 ml/m AORTIC VALVE AV Area (Vmax):    0.65 cm AV Area (Vmean):   0.63 cm AV Area (VTI):     0.62 cm AV Vmax:           347.00 cm/s AV Vmean:          237.000 cm/s AV VTI:            0.870 m AV Peak Grad:      48.2 mmHg AV Mean Grad:      27.0 mmHg LVOT Vmax:         71.90 cm/s LVOT Vmean:        47.800 cm/s LVOT VTI:          0.173 m LVOT/AV VTI ratio: 0.20   AORTA Ao Root diam: 3.10 cm Ao Asc diam:  4.10 cm   MR  Peak grad: 117.5 mmHg  TRICUSPID VALVE MR Mean grad: 70.0 mmHg   TR Peak grad:   21.9 mmHg MR Vmax:      542.00 cm/s TR Vmax:        234.00 cm/s MR Vmean:     393.0 cm/s  Estimated RAP:  3.00 mmHg RVSP:           24.9  mmHg   SHUNTS Systemic VTI:  0.17 m Systemic Diam: 2.00 cm   Vina Gull MD Electronically signed by Vina Gull MD Signature Date/Time: 12/19/2023/11:17:58 PM       Final       CT SCANS   CT CORONARY MORPH W/CTA COR W/SCORE 05/23/2024   Addendum 05/29/2024  5:20 PM ADDENDUM REPORT: 05/29/2024 17:18   EXAM: OVER-READ INTERPRETATION CT CHEST   The following report is an over-read performed by radiologist Dr. Rogelia Myers of [facility] on 05/29/2024. This over-read does not include interpretation of cardiac or coronary anatomy or pathology. The coronary CTA interpretation by the cardiologist is attached.   COMPARISON:  None Available.   FINDINGS: Pulmonary Embolism: No pulmonary embolism.   Cardiovascular: No pericardial effusion. No aortic aneurysm. Scattered calcified atherosclerosis throughout the descending aorta.   Mediastinum/Nodes: No mediastinal mass. 1.5 cm precarinal lymph node. No additional lymphadenopathy in the chest. Normal esophagus.   Lungs/Pleura: The midline trachea and bronchi are patent. Subsegmental atelectasis along the paramediastinal right middle lobe. No focal airspace consolidation, pleural effusion, or pneumothorax. Fibrolinear scarring in the left lower lobe.   Musculoskeletal: No acute fracture or destructive bone lesion. Multilevel degenerative disc disease of the spine. Thoracic DISH.   Upper Abdomen: No acute abnormality in the partially visualized upper abdomen.   IMPRESSION: No acute abnormality within the visualized chest. No pneumonia, pulmonary edema, or pleural effusion.     Electronically Signed By: Rogelia Myers M.D. On: 05/29/2024 17:18   Narrative CLINICAL DATA:  Severe Aortic  Stenosis.   EXAM: Cardiac TAVR CT   TECHNIQUE: A non-contrast, gated CT scan was obtained with axial slices of 2.5 mm through the heart for aortic valve scoring. A 120 kV retrospective, gated, contrast cardiac scan was obtained. Gantry rotation speed was 230 msec and collimation was 0.63 mm. Nitroglycerin  was not given. The 3D dataset was reconstructed in systole with motion correction. The 3D data set was reconstructed in 5% intervals of the 0-95% of the R-R cycle. Systolic and diastolic phases were analyzed on a dedicated workstation using MPR, MIP, and VRT modes. The patient received 100 cc of contrast.   FINDINGS: Image quality: Excellent.   Noise artifact is: Limited.   Valve Morphology: Tricuspid aortic valve with severe calcifications. Severely restricted movement in systole. Bulky calcification of the NCC.   Aortic Valve Calcium  score: 5017   Aortic annular dimension:   Phase assessed: 22%   Annular area: 471 mm2   Annular perimeter: 78.1 mm   Max diameter: 27.2 mm   Min diameter: 22.1 mm   Annular and subannular calcification: Mild annular calcium  under the LCC.   Membranous septum length: 13.6 mm   Optimal coplanar projection: LAO 11 CRA 0   Coronary Artery Height above Annulus:   Left Main: 10.6 mm   Right Coronary: 15.9 mm   Sinus of Valsalva Measurements:   Non-coronary: 34 mm   Right-coronary: 34 mm   Left-coronary: 36 mm   Sinus of Valsalva Height:   Non-coronary: 26.9 mm   Right-coronary: 24.8 mm   Left-coronary: 21.2 mm   Sinotubular Junction: 33 mm   Ascending Thoracic Aorta: 37 mm   Coronary Arteries: Normal coronary origin. Right dominance. The study was performed without use of NTG and is insufficient for plaque evaluation. Please refer to recent cardiac catheterization for coronary assessment.   Cardiac Morphology:   Right Atrium: Right atrial size is dilated. Contrast reflux noted in the IVC consistent with  elevated  RA pressure.   Right Ventricle: The right ventricular cavity is dilated.   Left Atrium: Left atrial size is normal in size with no left atrial appendage filling defect.   Left Ventricle: The ventricular cavity size is within normal limits.   Pulmonary arteries: Dilated pulmonary artery suggestive of pulmonary hypertension.   Pulmonary veins: Normal pulmonary venous drainage.   Pericardium: Normal thickness with no significant effusion or calcium  present.   Mitral Valve: The mitral valve is normal structure without significant calcification.   Extra-cardiac findings: See attached radiology report for non-cardiac structures.   IMPRESSION: 1. Tricuspid aortic valve with severe aortic stenosis.   2. Annular measurements support a 26 mm S3 or 29 mm Evolut Pro.   3. Mild annular calcium  under the LCC.   4. Shallow left main height but large sinuses noted.   5. Optimal Fluoroscopic Angle for Delivery: LAO 11 CRA 0   6. Dilated pulmonary artery suggestive of pulmonary hypertension.   Darryle T. Barbaraann, MD   Electronically Signed: By: Darryle Barbaraann M.D. On: 05/23/2024 18:54       ______________________________________________________________________________________________       ECG Afib, RBBB    I have independently reviewed the above radiologic studies and discussed with the patient    Recent Lab Findings: Recent Labs       Lab Results  Component Value Date    WBC 6.5 05/09/2024    HGB 11.8 (L) 05/09/2024    HCT 36.3 (L) 05/09/2024    PLT 138 (L) 05/09/2024    GLUCOSE 121 (H) 05/09/2024    CHOL 91 01/18/2024    TRIG 121.0 01/18/2024    HDL 29.00 (L) 01/18/2024    LDLDIRECT 104.0 01/17/2023    LDLCALC 38 01/18/2024    ALT 35 01/18/2024    AST 35 01/18/2024    NA 138 05/09/2024    K 4.4 05/09/2024    CL 98 05/09/2024    CREATININE 1.24 05/09/2024    BUN 25 05/09/2024    CO2 23 05/09/2024    TSH 1.17 01/18/2024    INR 1.8 (A) 05/23/2024     HGBA1C 6.8 (H) 01/18/2024            Assessment / Plan:   89 y.o. male with severe aortic stenosis.  STS score: 6.  NYHA Class II.  The risks and benefits of transfemoral TAVR were discussed in detail.  We also discussed possibility of an emergent sternotomy to address any procedural complications.  Based on our discussion, we collectively decided that an emergent sternotomy would not be indicated.  The patient is agreeable to proceed.  Based on my review of her LHC, echo, and CTA, I agree with the multidisciplinary plan to proceed with a 26mm S# TAVR.

## 2024-07-03 NOTE — Discharge Summary (Incomplete)
 HEART AND VASCULAR CENTER   MULTIDISCIPLINARY HEART VALVE TEAM  Discharge Summary    Patient ID: Marvin Foster MRN: [id number]; DOB: November 24, 1933  Admit date: 07/03/2024 Discharge date: 07/05/2024  PCP:  [name] BRAVO, MD  [facility] Cardiologist:  Oneil Parchment, MD  [facility] Structural heart: Lonni Cash, MD [facility] Electrophysiologist:  OLE ONEIDA HOLTS, MD   Discharge Diagnoses    Principal Problem:   S/P TAVR (transcatheter aortic valve replacement) Active Problems:   Diabetes mellitus type II, controlled (HCC)   Essential hypertension   Permanent atrial fibrillation (HCC)   Hyperlipidemia associated with type 2 diabetes mellitus (HCC)   Coronary artery disease due to lipid rich plaque   Hypothyroid   Severe aortic stenosis   Obesity (BMI 30-39.9)   RBBB   CHB (complete heart block) (HCC)   S/P placement of cardiac pacemaker   Pneumonia   Allergies Allergies  Allergen Reactions   Neosporin Original [Neomycin-Bacitracin Zn-Polymyx] Rash   Penicillins Rash   Sulfa Antibiotics Other (See Comments)    Unknown reaction   Vasotec  [Enalapril ] Other (See Comments)    Hyperkalemia    Zeasorb-Af [Miconazole Nitrate] Rash and Other (See Comments)    Unknown reaction    Diagnostic Studies/Procedures    HEART AND VASCULAR CENTER  TAVR OPERATIVE NOTE     Date of Procedure:                07/03/2024   Preoperative Diagnosis:      Severe Aortic Stenosis    Postoperative Diagnosis:    Same    Procedure:        Transcatheter Aortic Valve Replacement - Transfemoral Approach             Edwards Sapien 3 THV (size 26 mm, model # [id number], serial # [id number] )              Co-Surgeons:                        Lonni Cash, MD and Linnie Rayas , MD    Anesthesiologist:                  Waddell   Echocardiographer:              Santo   Pre-operative Echo Findings: Severe aortic stenosis Normal left ventricular  systolic function   Post-operative Echo Findings: No paravalvular leak Normal left ventricular systolic function  _____________  Echo 07/04/24:  IMPRESSIONS  1. S/P TAVR 07/03/24 with no AI; mean gradient 8 mmH and DI 0.84.   2. Left ventricular ejection fraction, by estimation, is 70 to 75%. The  left ventricle has hyperdynamic function. The left ventricle has no  regional wall motion abnormalities. There is mild concentric left  ventricular hypertrophy. Left ventricular  diastolic parameters are indeterminate.   3. Right ventricular systolic function is normal. The right ventricular  size is normal.   4. The mitral valve is normal in structure. Mild mitral valve  regurgitation. No evidence of mitral stenosis. Moderate mitral annular  calcification.   5. The aortic valve has been repaired/replaced. Aortic valve  regurgitation is not visualized. No aortic stenosis is present. There is a  26 mm Edwards Sapien prosthetic (TAVR) valve present in the aortic  position. Procedure Date: 07/03/24. Echo findings  are consistent with normal structure and function of the aortic valve  prosthesis.   6. The inferior vena cava is dilated in  size with >50% respiratory  variability, suggesting right atrial pressure of 8 mmHg.   _____________  07/04/24 PACEMAKER IMPLANT   Conclusion   CONCLUSIONS:   1. Symptomatic bradycardia due to complete heart block  2. Successful single chamber permanent pacemaker with left bundle area lead  3. No early apparent complications.   4. Hold anticoagulation for 5 days (OK to restart 07/10/2024)   History of Present Illness     Marvin Foster is a 89 y.o. male with a history of persistent atrial fibrillation on Coumadin , CAD s/p LAD stent in 2006, DM, HLD, HTN, hypothyroidism, RBBB and severe LFLG aortic stenosis who presented to [facility] on 07/03/24 for planned TAVR.   He has been followed by Dr. Verlin in the valve clinic since at least 2024 for severe  asymptomatic LFLG AS. Echo 12/19/23 with LVEF 60-65%, mild to moderate MR and LFLG severe AS with mean gradient 25 mmHg, AVA 0.58 cm2, DI 0.2, SVI 26. LHC 05/16/24 showed mild non obst CAD.   The patient was evaluated by the multidisciplinary valve team and felt to have severe, symptomatic aortic stenosis and to be a suitable candidate for TAVR, which was set up for 07/03/24.  Hospital Course     Consultants: none   Severe AS:  -- S/p successful TAVR with a 26 mm Edwards Sapien 3 Ultra Resilia THV via the TF approach on 07/03/24.  -- Post operative echo showed EF 70%, normally functioning TAVR with a mean gradient of 8 mmHg and no PVL. -- Groin sites are stable.  -- Hold Coumadin  for 5 days (OK to restart 07/10/2024).  -- Met with cardiac rehab to discuss CRP phase II.  -- Plan for discharge home today with close follow up in the outpatient setting.   RBBB: -- With CHB after TAVR.  -- S/p PPM with a single chamber permanent pacemaker with left bundle area lead by Dr. Cindie on 07/04/24. -- Device interrogation normal and CXR with no PTX. -- Hold Coumadin  for 5 days (OK to restart 07/10/2024).  Persistent atrial fibrillation: -- Hold Coumadin  for 5 days (OK to restart 07/10/2024). --Coumadin  clinic apt 10/2.  CAD: -- S/p LAD stent in 2006 -- LHC 05/16/24 showed mild non obst CAD.  -- Continue medical therapy.   DMT2:  -- Treated with SSI while admitted.  -- Resume home meds at discharge.  -- Okay to resume Metformin  after 48 hours after contrast dye exposure (9/25PM).  PNA on CXR: -- Pt has associated fever and chills.  -- CXR with focal consolidation in the RLL.  -- Procaclitonin normal. UA normal. Blood cultures NGTD. Mild leukocytosis with a left shift -- He is adamant that he would like to go home.  -- Discussed with pharmacy and will discharge him home with Ceftin  500mg  BID. -- Went over return precautions.   _____________  Discharge Vitals Blood pressure (!) 130/47, pulse  60, temperature 98.1 F (36.7 C), temperature source Oral, resp. rate 16, height 5' 9 (1.753 m), weight 91.1 kg, SpO2 93%.  Filed Weights   07/03/24 0547 07/04/24 0500 07/05/24 0445  Weight: 93.4 kg 94 kg 91.1 kg     GEN: Well nourished, well developed in no acute distress NECK: No JVD CARDIAC: RRR, no murmurs, rubs, gallops RESPIRATORY:  Clear to auscultation without rales, wheezing or rhonchi  ABDOMEN: Soft, non-tender, non-distended EXTREMITIES:  No edema; No deformity.  Groin sites clear without hematoma or ecchymosis.    Disposition   Pt is being discharged home  today in good condition.  Follow-up Plans & Appointments     Follow-up Information     Sebastian Lamarr SAUNDERS, PA-C. Go on 07/09/2024.   Specialties: Cardiology, Radiology Why: @ 9:55am, please arrive at least 20 minutes early. Contact information: [address] [place] [address] [phone]                Discharge Instructions     Amb Referral to Cardiac Rehabilitation   Complete by: As directed    Diagnosis: Valve Replacement   Valve: Aortic   After initial evaluation and assessments completed: Virtual Based Care may be provided alone or in conjunction with Phase 2 Cardiac Rehab based on patient barriers.: Yes   Intensive Cardiac Rehabilitation (ICR) [facility] location only OR Traditional Cardiac Rehabilitation (TCR) *If criteria for ICR are not met will enroll in TCR [facility] only): Yes       Discharge Medications   Allergies as of 07/05/2024       Reactions   Neosporin Original [neomycin-bacitracin Zn-polymyx] Rash   Penicillins Rash   Sulfa Antibiotics Other (See Comments)   Unknown reaction   Vasotec  [enalapril ] Other (See Comments)   Hyperkalemia    Zeasorb-af [miconazole Nitrate] Rash, Other (See Comments)   Unknown reaction        Medication List     PAUSE taking these medications    warfarin 2.5 MG tablet Wait to take this until: July 10, 2024 Commonly known as:  COUMADIN  Take as directed. If you are unsure how to take this medication, talk to your nurse or doctor. Original instructions: TAKE 1/2 TO 1 (ONE-HALF TO ONE) TABLET BY MOUTH ONCE DAILY OR  AS  DIRECTED  BY  ANTICOAGULATION  CLINIC What changed:  how much to take how to take this when to take this additional instructions       TAKE these medications    allopurinol  100 MG tablet Commonly known as: ZYLOPRIM  Take 1 tablet (100 mg total) by mouth daily.   amLODipine  2.5 MG tablet Commonly known as: NORVASC  Take 1 tablet by mouth once daily   atorvastatin  40 MG tablet Commonly known as: LIPITOR TAKE 1 TABLET BY MOUTH AT BEDTIME   Besivance 0.6 % Susp Generic drug: Besifloxacin HCl Place 1 drop into the left eye See admin instructions. Administer 1 drop into the left eye four times daily for 2 days. Start the day before eye injection and continue for 1 day following.   CALCIUM  PO Take 1 tablet by mouth daily.   cefUROXime  500 MG tablet Commonly known as: CEFTIN  Take 1 tablet (500 mg total) by mouth 2 (two) times daily with a meal.   cetirizine  10 MG tablet Commonly known as: ZYRTEC  Take 1 tablet by mouth once daily   diclofenac  Sodium 1 % Gel Commonly known as: Voltaren  Apply 2 g topically 4 (four) times daily. What changed:  when to take this reasons to take this   FISH OIL PO Take 2 capsules by mouth daily.   furosemide  40 MG tablet Commonly known as: LASIX  Take 1 tablet by mouth once daily   IRON PO Take 1 tablet by mouth daily.   latanoprost  0.005 % ophthalmic solution Commonly known as: XALATAN  Place 1 drop into both eyes at bedtime.   levothyroxine  75 MCG tablet Commonly known as: SYNTHROID  Take 1 tablet by mouth once daily   metFORMIN  1000 MG tablet Commonly known as: GLUCOPHAGE  Take 1 tablet by mouth twice daily   metoprolol  tartrate 100 MG tablet  Commonly known as: LOPRESSOR  Take 1 tablet by mouth twice daily   Multivitamin Men 50+  Tabs Take 1 tablet by mouth daily.   onetouch ultrasoft lancets Use as instructed   OneTouch Verio test strip Generic drug: glucose blood USE 1 STRIP TO CHECK GLUCOSE TWICE DAILY AS DIRECTED   tamsulosin  0.4 MG Caps capsule Commonly known as: FLOMAX  Take 1 capsule by mouth once daily   timolol  0.5 % ophthalmic solution Commonly known as: TIMOPTIC  Place 1 drop into both eyes daily.   tolterodine  1 MG tablet Commonly known as: Detrol  Take 1 tablet (1 mg total) by mouth 2 (two) times daily.   ZINC PO Take 1 tablet by mouth daily.         Outstanding Labs/Studies   non  ______________________  Duration of Discharge Encounter: APP Time: 25 minutes    Signed, Lamarr Hummer, PA-C 07/05/2024, 12:00 PM [phone]

## 2024-07-03 NOTE — Progress Notes (Signed)
  HEART AND VASCULAR CENTER   MULTIDISCIPLINARY HEART VALVE TEAM  Patient doing well s/p TAVR. He is hemodynamically stable. Groin sites stable. Tele with paced rhythm and pacer dependant with CHB underneath. RIJ temp wire losing capture with even small movement such as using the urinal. I have discussed with Dr [place] who will bring him back to the cath lab to replace temp wire.    Lamarr Hummer PA-C  [facility]  Pager [phone]

## 2024-07-03 NOTE — Op Note (Signed)
 HEART AND VASCULAR CENTER  TAVR OPERATIVE NOTE     Date of Procedure:                07/03/2024   Preoperative Diagnosis:      Severe Aortic Stenosis    Postoperative Diagnosis:    Same    Procedure:        Transcatheter Aortic Valve Replacement - Transfemoral Approach             Edwards Sapien 3 THV (size 26 mm, model # [id number], serial # [id number] )              Co-Surgeons:                        Lonni Cash, MD and Linnie Rayas , MD    Anesthesiologist:                  Waddell   Echocardiographer:              Santo   Pre-operative Echo Findings: Severe aortic stenosis Normal left ventricular systolic function   Post-operative Echo Findings: No paravalvular leak Normal left ventricular systolic function   BRIEF CLINICAL NOTE AND INDICATIONS FOR SURGERY   89 yo male with history of persistent atrial fibrillation, CAD, DM, hyperlipidemia, HTN, hypothyroidism, RBBB and severe aortic stenosis. He has persistent atrial fibrillation and is on coumadin . Mild to moderate MR. Low flow, low gradient severe AS with mean gradient 25 mmHg, AVA 0.58 cm2, DI 0.2, SVI 26.    During the course of the patient's preoperative work up they have been evaluated comprehensively by a multidisciplinary team of specialists coordinated through the Multidisciplinary Heart Valve Clinic in the [facility].  They have been demonstrated to suffer from symptomatic severe aortic stenosis as noted above. The patient has been counseled extensively as to the relative risks and benefits of all options for the treatment of severe aortic stenosis including long term medical therapy, conventional surgery for aortic valve replacement, and transcatheter aortic valve replacement.  The patient has been independently evaluated by Dr. Rayas with CT surgery and they are felt to be at high risk for conventional surgical aortic valve replacement. The surgeon indicated the  patient would be a poor candidate for conventional surgery. Based upon review of all of the patient's preoperative diagnostic tests they are felt to be candidate for transcatheter aortic valve replacement using the transfemoral approach as an alternative to high risk conventional surgery.     Following the decision to proceed with transcatheter aortic valve replacement, a discussion has been held regarding what types of management strategies would be attempted intraoperatively in the event of life-threatening complications, including whether or not the patient would be considered a candidate for the use of cardiopulmonary bypass and/or conversion to open sternotomy for attempted surgical intervention.  The patient has been advised of a variety of complications that might develop peculiar to this approach including but not limited to risks of death, stroke, paravalvular leak, aortic dissection or other major vascular complications, aortic annulus rupture, device embolization, cardiac rupture or perforation, acute myocardial infarction, arrhythmia, heart block or bradycardia requiring permanent pacemaker placement, congestive heart failure, respiratory failure, renal failure, pneumonia, infection, other late complications related to structural valve deterioration or migration, or other complications that might ultimately cause a temporary or permanent loss of functional independence or other long term morbidity.  The patient provides full informed consent for  the procedure as described and all questions were answered preoperatively.       DETAILS OF THE OPERATIVE PROCEDURE   PREPARATION:   The patient is brought to the operating room on the above mentioned date and central monitoring was established by the anesthesia team including placement of a radial arterial line. The patient is placed in the supine position on the operating table.  Intravenous antibiotics are administered. Conscious sedation is used.     Baseline transthoracic echocardiogram was performed. The patient's chest, abdomen, both groins, and both lower extremities are prepared and draped in a sterile manner. A time out procedure is performed.     PERIPHERAL ACCESS:   Using the modified Seldinger technique, femoral arterial access was obtained with placement of a 6 Fr sheath in the left femoral artery using u/s guidance. The right neck was prepped and draped and using a micropuncture kit and ultrasound guidance, the right internal jugular vein was accessed. 6 French sheath placed in the right internal jugular vein. A pigtail diagnostic catheter was passed through the femoral arterial sheath under fluoroscopic guidance into the aortic root.  A temporary transvenous pacemaker catheter was passed through the internal jugular sheath under fluoroscopic guidance into the right ventricle.  The pacemaker was tested to ensure stable lead placement and pacemaker capture. Aortic root angiography was performed in order to determine the optimal angiographic angle for valve deployment.   TRANSFEMORAL ACCESS:  A micropuncture kit was used to gain access to the right femoral artery using u/s guidance. Position confirmed with angiography. Pre-closure with double ProGlide closure devices. The patient was heparinized systemically and ACT verified > 250 seconds.     A 14 Fr transfemoral E-sheath was introduced into the right femoral artery after progressively dilating over an Amplatz superstiff wire. An A-2  catheter was used to direct the J wire across the native aortic valve into the left ventricle. This was exchanged out for a pigtail catheter and position was confirmed in the LV apex. Simultaneous LV and Ao pressures were recorded.  The pigtail catheter was then exchanged for a Safari wire in the LV apex.    TRANSCATHETER HEART VALVE DEPLOYMENT:  An Edwards Sapien 3 THV (size 26 mm) was prepared and crimped per manufacturer's guidelines, and the proper  orientation of the valve is confirmed on the [employer] delivery system. The valve was advanced through the introducer sheath using normal technique until in an appropriate position in the abdominal aorta beyond the sheath tip. The balloon was then retracted and using the fine-tuning wheel was centered on the valve. The valve was then advanced across the aortic arch using appropriate flexion of the catheter. The valve was carefully positioned across the aortic valve annulus. The Commander catheter was retracted using normal technique. Once final position of the valve has been confirmed by angiographic assessment, the valve is deployed while temporarily holding ventilation and during rapid ventricular pacing to maintain systolic blood pressure < 50 mmHg and pulse pressure < 10 mmHg. The balloon inflation is held for >3 seconds after reaching full deployment volume. Once the balloon has fully deflated the balloon is retracted into the ascending aorta and valve function is assessed using TTE. There is felt to be no paravalvular leak and no central aortic insufficiency.  The patient's hemodynamic recovery following valve deployment is good.  The deployment balloon and guidewire are both removed. Echo demostrated acceptable post-procedural gradients, stable mitral valve function, and no AI.    PROCEDURE COMPLETION:  The  sheath was then removed and closure devices were completed. Protamine  was administered once femoral arterial repair was complete. The temporary pacemaker was left in place due to pacemaker dependence with heart block/bradycardia. The pigtail catheters and femoral sheath were removed with a Mynx closure device placed in the left femoral artery.    The patient tolerated the procedure well and is transported to the surgical intensive care in stable condition. There were no immediate intraoperative complications. All sponge instrument and needle counts are verified correct at completion of the  operation.    No blood products were administered during the operation.   The patient received a total of 40 mL of intravenous contrast during the procedure.   LVEDP: 15 mmHg  Linnie MALVA Rayas, MD

## 2024-07-03 NOTE — Transfer of Care (Signed)
 Immediate Anesthesia Transfer of Care Note  Patient: Marvin Foster  Procedure(s) Performed: Transcatheter Aortic Valve Replacement, Transfemoral ECHOCARDIOGRAM, TRANSTHORACIC  Patient Location: Cath Lab  Anesthesia Type:MAC  Level of Consciousness: drowsy, patient cooperative, and responds to stimulation  Airway & Oxygen Therapy: Patient Spontanous Breathing and Patient connected to face mask oxygen  Post-op Assessment: Report given to RN, Post -op Vital signs reviewed and stable, Patient moving all extremities X 4, and Patient able to stick tongue midline  Post vital signs: Reviewed and stable  Last Vitals:  Vitals Value Taken Time  BP 92/55   Temp 98.6   Pulse 75 07/03/24 09:29  Resp 20 07/03/24 09:29  SpO2 100     Last Pain:  Vitals:   07/03/24 0634  PainSc: 0-No pain         Complications: There were no known notable events for this encounter.

## 2024-07-03 NOTE — Anesthesia Postprocedure Evaluation (Signed)
 Anesthesia Post Note  Patient: Marvin Foster  Procedure(s) Performed: Transcatheter Aortic Valve Replacement, Transfemoral ECHOCARDIOGRAM, TRANSTHORACIC     Patient location during evaluation: ICU Anesthesia Type: MAC Level of consciousness: patient cooperative Pain management: satisfactory to patient Vital Signs Assessment: post-procedure vital signs reviewed and stable Respiratory status: spontaneous breathing Cardiovascular status: blood pressure returned to baseline (Temporary Pacing) Anesthetic complications: no Comments: Required temporary pacing after valve deployment. Transferred to ICU for further management.    There were no known notable events for this encounter.         Lauraine KATHEE [place]

## 2024-07-03 NOTE — CV Procedure (Signed)
 HEART AND VASCULAR CENTER  TAVR OPERATIVE NOTE   Date of Procedure:  07/03/2024  Preoperative Diagnosis: Severe Aortic Stenosis   Postoperative Diagnosis: Same   Procedure:   Transcatheter Aortic Valve Replacement - Transfemoral Approach  Edwards Sapien 3 THV (size 26 mm, model # [id number], serial # [id number] )   Co-Surgeons:  Lonni Cash, MD and Linnie Rayas , MD   Anesthesiologist:  Waddell  Echocardiographer:  Santo  Pre-operative Echo Findings: Severe aortic stenosis Normal left ventricular systolic function  Post-operative Echo Findings: No paravalvular leak Normal left ventricular systolic function  BRIEF CLINICAL NOTE AND INDICATIONS FOR SURGERY  89 yo male with history of persistent atrial fibrillation, CAD, DM, hyperlipidemia, HTN, hypothyroidism, RBBB and severe aortic stenosis. He has persistent atrial fibrillation and is on coumadin . Mild to moderate MR. Low flow, low gradient severe AS with mean gradient 25 mmHg, AVA 0.58 cm2, DI 0.2, SVI 26.   During the course of the patient's preoperative work up they have been evaluated comprehensively by a multidisciplinary team of specialists coordinated through the Multidisciplinary Heart Valve Clinic in the [facility].  They have been demonstrated to suffer from symptomatic severe aortic stenosis as noted above. The patient has been counseled extensively as to the relative risks and benefits of all options for the treatment of severe aortic stenosis including long term medical therapy, conventional surgery for aortic valve replacement, and transcatheter aortic valve replacement.  The patient has been independently evaluated by Dr. Rayas with CT surgery and they are felt to be at high risk for conventional surgical aortic valve replacement. The surgeon indicated the patient would be a poor candidate for conventional surgery. Based upon review of all of the patient's preoperative  diagnostic tests they are felt to be candidate for transcatheter aortic valve replacement using the transfemoral approach as an alternative to high risk conventional surgery.    Following the decision to proceed with transcatheter aortic valve replacement, a discussion has been held regarding what types of management strategies would be attempted intraoperatively in the event of life-threatening complications, including whether or not the patient would be considered a candidate for the use of cardiopulmonary bypass and/or conversion to open sternotomy for attempted surgical intervention.  The patient has been advised of a variety of complications that might develop peculiar to this approach including but not limited to risks of death, stroke, paravalvular leak, aortic dissection or other major vascular complications, aortic annulus rupture, device embolization, cardiac rupture or perforation, acute myocardial infarction, arrhythmia, heart block or bradycardia requiring permanent pacemaker placement, congestive heart failure, respiratory failure, renal failure, pneumonia, infection, other late complications related to structural valve deterioration or migration, or other complications that might ultimately cause a temporary or permanent loss of functional independence or other long term morbidity.  The patient provides full informed consent for the procedure as described and all questions were answered preoperatively.    DETAILS OF THE OPERATIVE PROCEDURE  PREPARATION:   The patient is brought to the operating room on the above mentioned date and central monitoring was established by the anesthesia team including placement of a radial arterial line. The patient is placed in the supine position on the operating table.  Intravenous antibiotics are administered. Conscious sedation is used.   Baseline transthoracic echocardiogram was performed. The patient's chest, abdomen, both groins, and both lower  extremities are prepared and draped in a sterile manner. A time out procedure is performed.   PERIPHERAL ACCESS:   Using the  modified Seldinger technique, femoral arterial access was obtained with placement of a 6 Fr sheath in the left femoral artery using u/s guidance. The right neck was prepped and draped and using a micropuncture kit and ultrasound guidance, the right internal jugular vein was accessed. 6 French sheath placed in the right internal jugular vein. A pigtail diagnostic catheter was passed through the femoral arterial sheath under fluoroscopic guidance into the aortic root.  A temporary transvenous pacemaker catheter was passed through the internal jugular sheath under fluoroscopic guidance into the right ventricle.  The pacemaker was tested to ensure stable lead placement and pacemaker capture. Aortic root angiography was performed in order to determine the optimal angiographic angle for valve deployment.  TRANSFEMORAL ACCESS:  A micropuncture kit was used to gain access to the right femoral artery using u/s guidance. Position confirmed with angiography. Pre-closure with double ProGlide closure devices. The patient was heparinized systemically and ACT verified > 250 seconds.    A 14 Fr transfemoral E-sheath was introduced into the right femoral artery after progressively dilating over an Amplatz superstiff wire. An A-2  catheter was used to direct the J wire across the native aortic valve into the left ventricle. This was exchanged out for a pigtail catheter and position was confirmed in the LV apex. Simultaneous LV and Ao pressures were recorded.  The pigtail catheter was then exchanged for a Safari wire in the LV apex.   TRANSCATHETER HEART VALVE DEPLOYMENT:  An Edwards Sapien 3 THV (size 26 mm) was prepared and crimped per manufacturer's guidelines, and the proper orientation of the valve is confirmed on the [employer] delivery system. The valve was advanced through the  introducer sheath using normal technique until in an appropriate position in the abdominal aorta beyond the sheath tip. The balloon was then retracted and using the fine-tuning wheel was centered on the valve. The valve was then advanced across the aortic arch using appropriate flexion of the catheter. The valve was carefully positioned across the aortic valve annulus. The Commander catheter was retracted using normal technique. Once final position of the valve has been confirmed by angiographic assessment, the valve is deployed while temporarily holding ventilation and during rapid ventricular pacing to maintain systolic blood pressure < 50 mmHg and pulse pressure < 10 mmHg. The balloon inflation is held for >3 seconds after reaching full deployment volume. Once the balloon has fully deflated the balloon is retracted into the ascending aorta and valve function is assessed using TTE. There is felt to be no paravalvular leak and no central aortic insufficiency.  The patient's hemodynamic recovery following valve deployment is good.  The deployment balloon and guidewire are both removed. Echo demostrated acceptable post-procedural gradients, stable mitral valve function, and no AI.   PROCEDURE COMPLETION:  The sheath was then removed and closure devices were completed. Protamine  was administered once femoral arterial repair was complete. The temporary pacemaker was left in place due to pacemaker dependence with heart block/bradycardia. The pigtail catheters and femoral sheath were removed with a Mynx closure device placed in the left femoral artery.   The patient tolerated the procedure well and is transported to the surgical intensive care in stable condition. There were no immediate intraoperative complications. All sponge instrument and needle counts are verified correct at completion of the operation.   No blood products were administered during the operation.  The patient received a total of 40 mL of  intravenous contrast during the procedure.  LVEDP: 15 mmHg  Lonni Cash  MD, FACC 07/03/2024 9:37 AM

## 2024-07-03 NOTE — Discharge Instructions (Signed)
 After Your Pacemaker   You have a Environmental education officer  If you have a Medtronic or Biotronik device, plug in your home monitor once you get home, and no manual interaction is required.   If you have an [employer] or [employer] device, plug your home monitor once you get home, sit near the device, and press the large activation button. Sit nearby until the process is complete, usually notated by lights on the monitor.   If you were set up for monitoring using an app on your phone, make sure the app remains open in the background and the Bluetooth remains on.  ACTIVITY Do not lift your arm above shoulder height for 1 week after your procedure. After 7 days, you may progress as below.  You should remove your sling 24 hours after your procedure, unless otherwise instructed by your provider.     Wednesday July 11, 2024  Thursday July 12, 2024 Friday July 13, 2024 Saturday July 14, 2024   Do not lift, push, pull, or carry anything over 10 pounds with the affected arm until 6 weeks (Wednesday August 15, 2024 ) after your procedure.   You may drive AFTER your wound check, unless you have been told otherwise by your provider.   Ask your healthcare provider when you can go back to work   INCISION/Dressing If you are on a blood thinner such as Coumadin , Xarelto, Eliquis , Plavix, or Pradaxa please confirm with your provider when this should be resumed.   If large square, outer bandage is left in place, this can be removed after 24 hours from your procedure. Do not remove steri-strips or glue as below.   If a PRESSURE DRESSING (a bulky dressing that usually goes up over your shoulder) was applied or left in place, please follow instructions given by your provider on when to return to have this removed.   Monitor your Pacemaker site for redness, swelling, and drainage. Call the device clinic at [phone] if you experience these symptoms or fever/chills.  If your incision is  sealed with Steri-strips or staples, you may shower 7 days after your procedure or when told by your provider. Do not remove the steri-strips or let the shower hit directly on your site. You may wash around your site with soap and water .    If you were discharged in a sling, please do not wear this during the day more than 48 hours after your surgery unless otherwise instructed. This may increase the risk of stiffness and soreness in your shoulder.   Avoid lotions, ointments, or perfumes over your incision until it is well-healed.  You may use a hot tub or a pool AFTER your wound check appointment if the incision is completely closed.  Pacemaker Alerts:  Some alerts are vibratory and others beep. These are NOT emergencies. Please call our office to let us  know. If this occurs at night or on weekends, it can wait until the next business day. Send a remote transmission.  If your device is capable of reading fluid status (for heart failure), you will be offered monthly monitoring to review this with you.   DEVICE MANAGEMENT Remote monitoring is used to monitor your pacemaker from home. This monitoring is scheduled every 91 days by our office. It allows us  to keep an eye on the functioning of your device to ensure it is working properly. You will routinely see your Electrophysiologist annually (more often if necessary).  This will appear as a REMOTE check on  your MyChart schedule. These are automatic and there is nothing for you to manually do unless otherwise instructed.  You should receive your ID card for your new device in 4-8 weeks. Keep this card with you at all times once received. Consider wearing a medical alert bracelet or necklace.  Your Pacemaker may be MRI compatible. This will be discussed at your next office visit/wound check.  You should avoid contact with strong electric or magnetic fields.   Do not use amateur (ham) radio equipment or electric (arc) welding torches. MP3 player  headphones with magnets should not be used. Some devices are safe to use if held at least 12 inches (30 cm) from your Pacemaker. These include power tools, lawn mowers, and speakers. If you are unsure if something is safe to use, ask your health care provider.  When using your cell phone, hold it to the ear that is on the opposite side from the Pacemaker. Do not leave your cell phone in a pocket over the Pacemaker.  You may safely use electric blankets, heating pads, computers, and microwave ovens.  Call the office right away if: You have chest pain. You feel more short of breath than you have felt before. You feel more light-headed than you have felt before. Your incision starts to open up.  This information is not intended to replace advice given to you by your health care provider. Make sure you discuss any questions you have with your health care provider.    ACTIVITY AND EXERCISE  Daily activity and exercise are an important part of your recovery. People recover at different rates depending on their general health and type of valve procedure.  Most people recovering from TAVR feel better relatively quickly   No lifting, pushing, pulling more than 10 pounds (examples to avoid: groceries, vacuuming, gardening, golfing):             - For one week with a procedure through the groin.             - For six weeks for procedures through the chest wall or neck. NOTE: You will typically see one of our providers 7-14 days after your procedure to discuss WHEN TO RESUME the above activities.      DRIVING  Do not drive until you are seen for follow up and cleared by a provider. Generally, we ask patient to not drive for 1 week after their procedure.  If you have been told by your doctor in the past that you may not drive, you must talk with him/her before you begin driving again.   DRESSING  Groin site: you may leave the clear dressing over the site until follow up or remove 3-5 days post  procedure.   HYGIENE  If you had a femoral (leg) procedure, you may take a shower when you return home. After the shower, pat the site dry. Do NOT use powder, oils or lotions in your groin area until the site has completely healed.   If you had a chest procedure, you may shower when you return home unless specifically instructed not to by your discharging practitioner.             - DO NOT scrub incision; pat dry with a towel.             - DO NOT apply any lotions, oils, powders to the incision.             - No tub baths / swimming  for at least 2 weeks.  If you notice any fevers, chills, increased pain, swelling, bleeding or pus, please contact your provider.   ADDITIONAL INFORMATION  If you are going to have an upcoming dental procedure, please contact our office as you will require antibiotics ahead of time to prevent infection on your heart valve.    If you have any questions or concerns you can call the structural heart phone during normal business hours 8am-4pm. If you have an urgent need after hours or weekends please call [phone] to talk to the on call provider for general cardiology. If you have an emergency that requires immediate attention, please call 911.    After TAVR Checklist  Check  Test Description   Follow up appointment in 1-2 weeks  You will see our structural heart advanced practice provider. Your incision sites will be checked and you will be cleared to drive and resume all normal activities if you are doing well.     1 month echo and follow up (23-75 days) You will have an echo to check on your new heart valve and be seen back in the office by a structural heart advanced practice provider.   Follow up with your primary cardiologist You will need to be seen by your primary cardiologist in the following 3-6 months after your 1 month appointment in the valve clinic.    1 year echo and follow up (60 days +/- the anniversary of your TAVR) You will have another echo to  check on your heart valve after 1 year and be seen back in the office by a structural heart advanced practice provider. This your last structural heart visit.   Bacterial endocarditis prophylaxis  You will have to take antibiotics for the rest of your life before all dental procedures (even teeth cleanings) to protect your heart valve. Antibiotics are also required before some surgeries. Please check with your cardiologist before scheduling any surgeries. Also, please make sure to tell us  if you have a penicillin allergy as you will require an alternative antibiotic.

## 2024-07-04 ENCOUNTER — Other Ambulatory Visit: Payer: Self-pay

## 2024-07-04 ENCOUNTER — Inpatient Hospital Stay (HOSPITAL_COMMUNITY)

## 2024-07-04 ENCOUNTER — Encounter (HOSPITAL_COMMUNITY): Payer: Self-pay | Admitting: Cardiovascular Disease

## 2024-07-04 ENCOUNTER — Inpatient Hospital Stay (HOSPITAL_COMMUNITY)
Admission: RE | Disposition: A | Discharge status: Home / Self Care | Payer: Self-pay | Source: Home / Self Care | Attending: Cardiovascular Disease

## 2024-07-04 DIAGNOSIS — Z950 Presence of cardiac pacemaker: Secondary | ICD-10-CM | POA: Insufficient documentation

## 2024-07-04 DIAGNOSIS — Z952 Presence of prosthetic heart valve: Secondary | ICD-10-CM

## 2024-07-04 DIAGNOSIS — I350 Nonrheumatic aortic (valve) stenosis: Secondary | ICD-10-CM | POA: Diagnosis not present

## 2024-07-04 DIAGNOSIS — R001 Bradycardia, unspecified: Secondary | ICD-10-CM | POA: Diagnosis not present

## 2024-07-04 DIAGNOSIS — I442 Atrioventricular block, complete: Secondary | ICD-10-CM | POA: Diagnosis not present

## 2024-07-04 DIAGNOSIS — J189 Pneumonia, unspecified organism: Secondary | ICD-10-CM | POA: Diagnosis not present

## 2024-07-04 DIAGNOSIS — Z8616 Personal history of COVID-19: Secondary | ICD-10-CM | POA: Diagnosis not present

## 2024-07-04 HISTORY — PX: PACEMAKER IMPLANT: EP1218

## 2024-07-04 LAB — CBC
HCT: 32.4 % — ABNORMAL LOW (ref 39.0–52.0)
Hemoglobin: 10.9 g/dL — ABNORMAL LOW (ref 13.0–17.0)
MCH: 31.8 pg (ref 26.0–34.0)
MCHC: 33.6 g/dL (ref 30.0–36.0)
MCV: 94.5 fL (ref 80.0–100.0)
Platelets: 124 K/uL — ABNORMAL LOW (ref 150–400)
RBC: 3.43 MIL/uL — ABNORMAL LOW (ref 4.22–5.81)
RDW: 14.6 % (ref 11.5–15.5)
WBC: 9.9 K/uL (ref 4.0–10.5)
nRBC: 0 % (ref 0.0–0.2)

## 2024-07-04 LAB — BASIC METABOLIC PANEL WITH GFR
Anion gap: 13 (ref 5–15)
BUN: 14 mg/dL (ref 8–23)
CO2: 24 mmol/L (ref 22–32)
Calcium: 8.3 mg/dL — ABNORMAL LOW (ref 8.9–10.3)
Chloride: 100 mmol/L (ref 98–111)
Creatinine, Ser: 0.97 mg/dL (ref 0.61–1.24)
GFR, Estimated: >60 mL/min (ref >60)
Glucose, Bld: 119 mg/dL — ABNORMAL HIGH (ref 70–99)
Potassium: 4.1 mmol/L (ref 3.5–5.1)
Sodium: 137 mmol/L (ref 135–145)

## 2024-07-04 LAB — GLUCOSE, CAPILLARY
Glucose-Capillary: 145 mg/dL — ABNORMAL HIGH (ref 70–99)
Glucose-Capillary: 143 mg/dL — ABNORMAL HIGH (ref 70–99)
Glucose-Capillary: 131 mg/dL — ABNORMAL HIGH (ref 70–99)
Glucose-Capillary: 202 mg/dL — ABNORMAL HIGH (ref 70–99)

## 2024-07-04 LAB — ECHOCARDIOGRAM COMPLETE
AR max vel: 2.33 cm2
AV Area VTI: 2.38 cm2
AV Area mean vel: 2.17 cm2
AV Mean grad: 8 mmHg
AV Peak grad: 12.9 mmHg
Ao pk vel: 1.8 m/s
Area-P 1/2: 3.74 cm2
Height: 69 in
S' Lateral: 2.9 cm
Weight: 3315.72 [oz_av]

## 2024-07-04 LAB — MAGNESIUM: Magnesium: 1.2 mg/dL — ABNORMAL LOW (ref 1.7–2.4)

## 2024-07-04 LAB — PROTIME-INR
INR: 1.4 — ABNORMAL HIGH (ref 0.8–1.2)
Prothrombin Time: 17.5 s — ABNORMAL HIGH (ref 11.4–15.2)

## 2024-07-04 SURGERY — PACEMAKER IMPLANT

## 2024-07-04 MED ORDER — FENTANYL CITRATE (PF) 100 MCG/2ML IJ SOLN
INTRAMUSCULAR | Status: DC | PRN
  Administered 2024-07-04: 12.5 ug via INTRAVENOUS

## 2024-07-04 MED ORDER — LIDOCAINE HCL (PF) 1 % IJ SOLN
INTRAMUSCULAR | Status: AC
  Filled 2024-07-04: qty 60

## 2024-07-04 MED ORDER — CEFAZOLIN SODIUM-DEXTROSE 2-4 GM/100ML-% IV SOLN
2.0000 g | INTRAVENOUS | Status: DC
Start: 2024-07-04 — End: 2024-07-04
  Filled 2024-07-04: qty 100

## 2024-07-04 MED ORDER — VANCOMYCIN HCL IN DEXTROSE 1-5 GM/200ML-% IV SOLN
INTRAVENOUS | Status: AC
  Administered 2024-07-04: 200 mg
  Filled 2024-07-04: qty 200

## 2024-07-04 MED ORDER — TAMSULOSIN HCL 0.4 MG PO CAPS
0.4000 mg | ORAL_CAPSULE | Freq: Every day | ORAL | Status: DC
  Administered 2024-07-04 – 2024-07-05 (×2): 0.4 mg via ORAL
  Filled 2024-07-04 (×2): qty 1

## 2024-07-04 MED ORDER — MIDAZOLAM HCL 5 MG/5ML IJ SOLN
INTRAMUSCULAR | Status: DC | PRN
  Administered 2024-07-04: .5 mg via INTRAVENOUS

## 2024-07-04 MED ORDER — ACETAMINOPHEN 160 MG/5ML PO SOLN
650.0000 mg | Freq: Once | ORAL | Status: AC
  Administered 2024-07-04: 650 mg via ORAL
  Filled 2024-07-04: qty 20.3

## 2024-07-04 MED ORDER — FENTANYL CITRATE (PF) 100 MCG/2ML IJ SOLN
INTRAMUSCULAR | Status: AC
  Filled 2024-07-04: qty 2

## 2024-07-04 MED ORDER — CHLORHEXIDINE GLUCONATE 4 % EX SOLN: 60.0000 mL | Freq: Once | CUTANEOUS | Status: AC

## 2024-07-04 MED ORDER — MAGNESIUM SULFATE 4 GM/100ML IV SOLN
4.0000 g | Freq: Once | INTRAVENOUS | Status: AC
  Administered 2024-07-04: 4 g via INTRAVENOUS
  Filled 2024-07-04: qty 100

## 2024-07-04 MED ORDER — LIDOCAINE HCL (PF) 1 % IJ SOLN
INTRAMUSCULAR | Status: DC | PRN
  Administered 2024-07-04: 60 mL

## 2024-07-04 MED ORDER — CHLORHEXIDINE GLUCONATE 4 % EX SOLN
60.0000 mL | Freq: Once | CUTANEOUS | Status: AC
  Administered 2024-07-04: 4 via TOPICAL
  Filled 2024-07-04: qty 15

## 2024-07-04 MED ORDER — SODIUM CHLORIDE 0.9 % IV SOLN
80.0000 mg | INTRAVENOUS | Status: AC
  Administered 2024-07-04: 80 mg
  Filled 2024-07-04: qty 2

## 2024-07-04 MED ORDER — SODIUM CHLORIDE 0.9% FLUSH: 3.0000 mL | INTRAVENOUS | Status: DC | PRN

## 2024-07-04 MED ORDER — ALLOPURINOL 100 MG PO TABS
100.0000 mg | ORAL_TABLET | Freq: Every day | ORAL | Status: DC
  Administered 2024-07-04 – 2024-07-05 (×2): 100 mg via ORAL
  Filled 2024-07-04 (×2): qty 1

## 2024-07-04 MED ORDER — SODIUM CHLORIDE 0.9 % IV SOLN
INTRAVENOUS | Status: AC
  Filled 2024-07-04: qty 2

## 2024-07-04 MED ORDER — MIDAZOLAM HCL 2 MG/2ML IJ SOLN
INTRAMUSCULAR | Status: AC
  Filled 2024-07-04: qty 2

## 2024-07-04 MED ORDER — SODIUM CHLORIDE 0.9% FLUSH
3.0000 mL | Freq: Two times a day (BID) | INTRAVENOUS | Status: DC
  Administered 2024-07-04 – 2024-07-05 (×3): 3 mL via INTRAVENOUS

## 2024-07-04 MED ORDER — LORATADINE 10 MG PO TABS
10.0000 mg | ORAL_TABLET | Freq: Every day | ORAL | Status: DC
  Administered 2024-07-04 – 2024-07-05 (×2): 10 mg via ORAL
  Filled 2024-07-04 (×2): qty 1

## 2024-07-04 MED ORDER — SODIUM CHLORIDE 0.9 % IV SOLN: INTRAVENOUS | Status: DC

## 2024-07-04 MED ORDER — HEPARIN (PORCINE) IN NACL 1000-0.9 UT/500ML-% IV SOLN
INTRAVENOUS | Status: DC | PRN
  Administered 2024-07-04: 500 mL

## 2024-07-04 SURGICAL SUPPLY — 12 items
CABLE SURGICAL S-101-97-12 (CABLE) ×2 IMPLANT
CATHETER SSPC NXT 2.5 DELIVRY (CATHETERS) IMPLANT
CUTTER LV DELIVERY CATHETER 7 (MISCELLANEOUS) IMPLANT
LEAD INGEVITY 7842 59 (Lead) IMPLANT
PACEMAKER ACCOLADE SR (Pacemaker) IMPLANT
PAD DEFIB RADIO PHYSIO CONN (PAD) ×2 IMPLANT
POUCH AIGIS-R ANTIBACT PPM MED (Mesh General) IMPLANT
SHEATH 8FR PRELUDE SNAP 13 (SHEATH) IMPLANT
SHEATH 9FR PRELUDE SNAP 13 (SHEATH) IMPLANT
SHEATH PROBE COVER 6X72 (BAG) IMPLANT
TRAY PACEMAKER INSERTION (PACKS) ×2 IMPLANT
WIRE HI TORQ VERSACORE-J 145CM (WIRE) IMPLANT

## 2024-07-04 NOTE — Plan of Care (Signed)
   Problem: Education: Goal: Knowledge of General Education information will improve Description: Including pain rating scale, medication(s)/side effects and non-pharmacologic comfort measures Outcome: Progressing   Problem: Clinical Measurements: Goal: Ability to maintain clinical measurements within normal limits will improve Outcome: Progressing Goal: Will remain free from infection Outcome: Progressing

## 2024-07-04 NOTE — Progress Notes (Addendum)
 HEART AND VASCULAR CENTER   MULTIDISCIPLINARY HEART VALVE TEAM  Patient Name: Marvin Foster Date of Encounter: 07/04/2024  Admit date: 07/03/2024   PCP:  [name] BRAVO, MD  [facility] Cardiologist:  Oneil Parchment, MD  [facility] Structural heart: Lonni Cash, MD [facility] Electrophysiologist:  None   Hospital Problem List     Principal Problem:   S/P TAVR (transcatheter aortic valve replacement) Active Problems:   Diabetes mellitus type II, controlled (HCC)   Essential hypertension   Permanent atrial fibrillation (HCC)   Hyperlipidemia associated with type 2 diabetes mellitus (HCC)   Coronary artery disease due to lipid rich plaque   Hypothyroid   Severe aortic stenosis   Obesity (BMI 30-39.9)   RBBB     Subjective   No complaints. Using urinal.   Inpatient Medications    Scheduled Meds:  allopurinol   100 mg Oral Daily   atorvastatin   40 mg Oral QHS   insulin  aspart  0-24 Units Subcutaneous TID AC & HS   latanoprost   1 drop Both Eyes QHS   levothyroxine   75 mcg Oral Daily   loratadine   10 mg Oral Daily   sodium chloride  flush  3 mL Intravenous Q12H   tamsulosin   0.4 mg Oral Daily   Continuous Infusions:  sodium chloride      nitroGLYCERIN  Stopped (07/03/24 1243)   norepinephrine  (LEVOPHED ) Adult infusion Stopped (07/03/24 1244)   PRN Meds: sodium chloride , acetaminophen  **OR** acetaminophen , morphine  injection, ondansetron  (ZOFRAN ) IV, oxyCODONE , sodium chloride  flush, traMADol    Vital Signs    Vitals:   07/04/24 0400 07/04/24 0500 07/04/24 0600 07/04/24 0700  BP: (!) 126/51 (!) 131/48 (!) 137/50 130/67  Pulse: 60 60 60 60  Resp: (!) 22 19 (!) 24 11  Temp:      TempSrc:      SpO2: 94% 93% 94% 95%  Weight:  94 kg    Height:        Intake/Output Summary (Last 24 hours) at 07/04/2024 0847 Last data filed at 07/04/2024 0718 Gross per 24 hour  Intake 1373.94 ml  Output 885 ml  Net 488.94 ml   Filed Weights   07/02/24  1300 07/03/24 0547 07/04/24 0500  Weight: 92.5 kg 93.4 kg 94 kg    Physical Exam    GEN: Well nourished, well developed, in no acute distress.  HEENT: Grossly normal.  Neck: Supple, no JVD or masses. RIJ in neck.  Cardiac: RRR, no murmurs, rubs, or gallops. No clubbing, cyanosis, edema.   Respiratory:  Respirations regular and unlabored, clear to auscultation bilaterally. GI: Soft, nontender, nondistended, BS + x 4. MS: no deformity or atrophy. Skin: warm and dry, no rash.  Groin sites clear without hematoma or ecchymosis Neuro:  Strength and sensation are intact. Psych: AAOx3.  Normal affect.  Labs    CBC Recent Labs    07/03/24 0918 07/04/24 0234  WBC  --  9.9  HGB 9.9* 10.9*  HCT 29.0* 32.4*  MCV  --  94.5  PLT  --  124*   Basic Metabolic Panel: Recent Labs  Lab 06/29/24 0854 07/03/24 0618 07/03/24 0918 07/04/24 0234  NA 137 138 139 137  K 5.4* 3.6 3.9 4.1  CL 106 102 100 100  CO2 21* 24  --  24  GLUCOSE 154* 105* 168* 119*  BUN 20 21 21 14   CREATININE 1.14 1.18 1.10 0.97  CALCIUM  9.0 8.4*  --  8.3*  MG  --   --   --  1.2*   GFR: Estimated Creatinine Clearance: 59.6 mL/min (by C-G formula based on SCr of 0.97 mg/dL). Recent Labs  Lab 06/29/24 0854 07/04/24 0234  WBC 6.5 9.9     Telemetry    Continuous pacing at HR 60 - Personally Reviewed  ECG    V paced at 60 - Personally Reviewed  Patient Profile     Marvin Foster is a 89 y.o. male with a history of persistent atrial fibrillation on Coumadin , CAD s/p LAD stent in 2006, DM, HLD, HTN, hypothyroidism, RBBB and severe LFLG aortic stenosis who presented to [facility] on 07/03/24 for planned TAVR.   Assessment & Plan    Severe AS:  -- S/p successful TAVR with a 26 mm Edwards Sapien 3 Ultra Resilia THV via the TF approach on 07/03/24.  -- Post operative echo completed but pending formal read. -- Groin sites are stable.  -- Continue to to hold Coumadin  until we know if/when he will require device  placement.    CHB: -- In the setting of a chronic RBBB after TAVR.  -- Currently pacer dependant. -- NPO/clear diet until EP makes a final decision.    Persistent atrial fibrillation: -- Holding home AVN blocking agents in the setting of CHB. -- Resume home Coumadin  at discharge.  -- INR check 07/12/24.   CAD: -- S/p LAD stent in 2006. -- LHC 05/16/24 showed mild non obst CAD.  -- Continue medical therapy.  -- Continue atorvastatin  40mg  daily. -- No Aspirin  with OAC need.   HTN:  -- BP well controlled.  -- Holding home Lopressor  100mg  BID.   DMT2:  -- Continue SSI.     Bonney Lamarr Hummer, PA-C  07/04/2024, 8:47 AM  Pager [phone]

## 2024-07-04 NOTE — Consult Note (Signed)
 ELECTROPHYSIOLOGY CONSULT NOTE    Patient ID: [name] MRN: [id number], DOB/AGE: 1934/05/28 89 y.o.  Admit date: 07/03/2024 Date of Consult: 07/04/2024  Primary Physician: [name] BRAVO, MD Primary Cardiologist: Oneil Parchment, MD  Electrophysiologist: New   Referring Provider: Dr. Verlin  Patient Profile: Marvin Foster is a 89 y.o. male with a history of RBBB, Severe AS, DM2, HTN, permanent AF and s/p LAD stent 2006 CAD who is being seen today for the evaluation of post operative CHB at the request of Dr. Verlin.  HPI:  Marvin Foster is a 89 y.o. male with medical history as above who presented for planned TAVR procedure yesterday.  Work up cath showed mild, non-obstructive CAD in RCA, Cx, and LAD.  Preo-op echo showed LVEF 60-65% and severe AS  Pt had RBBB at baseline and is in permanent AF. Intra-procedurally pt developed CHB which has persisted. He had loss of capture of his temp wire requiring re-positioning.   This am he is doing well. No new complaints. Has escape in the 30s when pacer is turned down. Denies chest pain or SOB. Able to lie flat OK.  Family is present for discussion and all are ok moving forward with pacing.  Labs Potassium4.1 (09/24 0234) Magnesium   1.2* (09/24 0234) Creatinine, ser  0.97 (09/24 0234) PLT  124* (09/24 0234) HGB  10.9* (09/24 0234) WBC 9.9 (09/24 0234)  .    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 Verlin and Dr. Shyrl   Scrotal mass 03/07/2012   Spinal stenosis 01/20/2005   Tubular adenoma of colon      Surgical History:  Past Surgical History:  Procedure Laterality Date   BACK SURGERY     BIOPSY  09/16/2022   Procedure: BIOPSY;  Surgeon: [name], MD;  Location: [facility];  Service: Gastroenterology;;   CATARACT EXTRACTION  08/11/2009   right   COLONOSCOPY WITH PROPOFOL  N/A 09/16/2022   Procedure: COLONOSCOPY WITH PROPOFOL ;  Surgeon: [name], MD;  Location: [facility];  Service: Gastroenterology;  Laterality: N/A;   corneal endothelial Left 2013   CORNEAL TRANSPLANT Left    eye   CORONARY ANGIOGRAPHY N/A 05/16/2024   Procedure: CORONARY ANGIOGRAPHY;  Surgeon: [name], MD;  Location: [facility] INVASIVE CV LAB;  Service: Cardiovascular;  Laterality: N/A;   CORONARY ANGIOPLASTY WITH STENT PLACEMENT     ESOPHAGOGASTRODUODENOSCOPY (EGD) WITH PROPOFOL  N/A 09/16/2022   Procedure: ESOPHAGOGASTRODUODENOSCOPY (EGD) WITH PROPOFOL ;  Surgeon: [name], MD;  Location: [facility];  Service: Gastroenterology;  Laterality: N/A;   EYE SURGERY     HOT HEMOSTASIS N/A 09/16/2022   Procedure: HOT HEMOSTASIS (ARGON PLASMA COAGULATION/BICAP);  Surgeon: [name], MD;  Location: [facility];  Service: Gastroenterology;  Laterality: N/A;   INTRAOPERATIVE TRANSTHORACIC ECHOCARDIOGRAM N/A 07/03/2024    Procedure: ECHOCARDIOGRAM, TRANSTHORACIC;  Surgeon: [name], MD;  Location: [facility] INVASIVE CV LAB;  Service: Cardiovascular;  Laterality: N/A;   POLYPECTOMY  09/16/2022   Procedure: POLYPECTOMY;  Surgeon: [name], MD;  Location: [facility];  Service: Gastroenterology;;   ROTATOR CUFF REPAIR     SMALL INTESTINE SURGERY     SPINE SURGERY     TEMPORARY PACEMAKER N/A 07/03/2024   Procedure: TEMPORARY PACEMAKER;  Surgeon: Swaziland, Peter M, MD;  Location: [facility] INVASIVE CV LAB;  Service: Cardiovascular;  Laterality: N/A;     Medications Prior to Admission  Medication Sig Dispense Refill Last Dose/Taking   allopurinol  (ZYLOPRIM ) 100 MG tablet Take 1 tablet (100 mg total) by mouth daily. 90 tablet 1 07/02/2024   amLODipine  (NORVASC ) 2.5 MG tablet Take 1 tablet by mouth once daily 90 tablet 2 07/02/2024   atorvastatin  (LIPITOR) 40 MG tablet TAKE 1 TABLET BY MOUTH AT BEDTIME 90 tablet 0 07/02/2024   BESIVANCE 0.6 % SUSP Place 1 drop into the left eye See admin instructions. Administer 1 drop into the left eye four times daily for 2 days. Start the day before eye injection and continue for 1 day following.   Taking   CALCIUM  PO Take 1 tablet by mouth daily.   Past Week   cetirizine  (ZYRTEC ) 10 MG tablet Take 1 tablet by mouth once daily 90 tablet 0 07/02/2024   diclofenac  Sodium (VOLTAREN ) 1 % GEL Apply 2 g topically 4 (four) times daily. (Patient taking differently: Apply 2 g topically 4 (four) times daily as needed (pain).) 100 g 1 Unknown   Ferrous Sulfate (IRON PO) Take 1 tablet by mouth daily.   Past Week   furosemide  (LASIX ) 40 MG tablet Take 1 tablet by mouth once daily 90 tablet 0 07/02/2024   latanoprost  (XALATAN ) 0.005 % ophthalmic solution Place 1 drop into both eyes at bedtime.   07/02/2024   levothyroxine  (SYNTHROID ) 75 MCG tablet Take 1 tablet by mouth once daily 90 tablet 0 07/03/2024 at  5:30 AM   metFORMIN  (GLUCOPHAGE ) 1000 MG tablet Take 1 tablet by mouth twice daily 180  tablet 0 Taking   metoprolol  tartrate (LOPRESSOR ) 100 MG tablet Take 1 tablet by mouth twice daily 180 tablet 0 07/02/2024   Multiple Vitamins-Minerals (MULTIVITAMIN MEN 50+) TABS Take 1 tablet by mouth daily.   Past Week   Multiple Vitamins-Minerals (ZINC PO) Take 1 tablet by mouth daily.   Past Week   Omega-3 Fatty Acids (FISH OIL PO) Take 2 capsules by mouth daily.   Past Week   tamsulosin  (FLOMAX ) 0.4 MG CAPS capsule Take 1 capsule by mouth once daily 90 capsule 0 Taking   timolol  (TIMOPTIC ) 0.5 % ophthalmic solution Place 1 drop into both eyes daily.    07/02/2024   warfarin (COUMADIN ) 2.5 MG tablet TAKE 1/2 TO 1 (ONE-HALF TO ONE) TABLET BY MOUTH ONCE DAILY OR  AS  DIRECTED  BY  ANTICOAGULATION  CLINIC (Patient taking differently: Take 1.25-2.5 mg by mouth See admin instructions. Take 1/2 tablet (1.25mg ) by mouth on Saturday and take 1 tablet (2.5mg ) on all other days.) 100 tablet 1 06/27/2024   Lancets (ONETOUCH ULTRASOFT) lancets Use as instructed 100 each 12    ONETOUCH VERIO test strip USE 1 STRIP TO CHECK GLUCOSE TWICE DAILY AS DIRECTED 100 each 0    tolterodine  (DETROL ) 1 MG tablet Take 1 tablet (1 mg total) by mouth 2 (two) times daily. (Patient not taking: Reported on 06/29/2024) 60 tablet 3 Not Taking    Inpatient Medications:   allopurinol   100 mg Oral Daily   atorvastatin   40 mg Oral QHS   insulin  aspart  0-24 Units Subcutaneous TID AC & HS   latanoprost   1 drop Both Eyes QHS   levothyroxine   75 mcg Oral Daily   loratadine   10 mg Oral Daily   sodium chloride  flush  3 mL Intravenous Q12H   sodium chloride  flush  3 mL Intravenous Q12H   tamsulosin   0.4 mg Oral Daily    Allergies:  Allergies  Allergen Reactions   Neosporin Original [Neomycin-Bacitracin Zn-Polymyx] Rash   Penicillins Rash   Sulfa Antibiotics Other (See Comments)    Unknown reaction   Vasotec  [Enalapril ] Other (See Comments)    Hyperkalemia    Zeasorb-Af [Miconazole Nitrate] Rash and Other (See Comments)     Unknown reaction    Family History  Problem Relation Age of Onset   Heart failure Mother        Heart attack age 21   Heart failure Father        Heart attack age 50   Stomach cancer Neg Hx    Colon cancer Neg Hx    Pancreatic cancer Neg Hx    Throat cancer Neg Hx      Physical Exam: Vitals:   07/04/24 0830 07/04/24 0845 07/04/24 0900 07/04/24 0915  BP:   (!) 128/54   Pulse: 61 60 (!) 58 60  Resp: (!) 23 (!) 23 (!) 21 (!) 21  Temp:      TempSrc:      SpO2: 94% 95% 94% 96%  Weight:      Height:        GEN- NAD, A&O x 3, normal affect HEENT: Normocephalic, atraumatic Lungs- CTAB, Normal effort.  Heart- Irregularly irregular rate and rhythm, No M/G/R.  GI- Soft, NT, ND.  Extremities- No clubbing, cyanosis, or edema   Radiology/Studies: CARDIAC CATHETERIZATION Result Date: 07/03/2024 Successful repositioning of temporary transvenous pacemaker under fluoro guidance Threshold 0.3 mA Wire marker 40 cm at sheath hub.   ECHOCARDIOGRAM LIMITED Result Date: 07/03/2024    ECHOCARDIOGRAM LIMITED REPORT   Patient Name:   Marvin Foster Date of Exam: 07/03/2024 Medical Rec #:  [id number]       Height:       69.0 in Accession #:    [id number]      Weight:       206.0 lb Date of Birth:  [birth year]       BSA:          2.092 m Patient Age:    89 years        BP:           129/70 mmHg Patient Gender: M               HR:  79 bpm. Exam Location:  Inpatient Procedure: Limited Echo, Color Doppler and Cardiac Doppler (Both Spectral and            Color Flow Doppler were utilized during procedure). Indications:     Aortic Stenosis i35.0  History:         Patient has prior history of Echocardiogram examinations, most                  recent 12/19/2023. CAD, Arrythmias:Atrial Fibrillation and RBBB;                  Risk Factors:Hypertension, Diabetes and Dyslipidemia.  Sonographer:     Damien Senior RDCS Referring Phys:  [id number] LONNI JONETTA CASH Diagnosing Phys: Stanly Leavens MD   Sonographer Comments: 26mm Edwards S3U TAVR Implanted IMPRESSIONS  1. Left ventricular ejection fraction, by estimation, is 55 to 60%. The left ventricle has normal function.  2. Right ventricular systolic function is normal. The right ventricular size is moderately enlarged.  3. Calcified chords . The mitral valve is degenerative. Mild to moderate mitral valve regurgitation. Moderate mitral annular calcification.  4. Prior to procedure the valve was severel calcified. Mean gradient 45 mm Hg, Peak gradient 68 mm Hg, AVA 0.58 with DVI 0.27. Consistent with at least severe aortic stenosis.     After procedure. Successful TAVR with a 26 mm Sapien Valve. There are two trivial jets of PVL. Mean gradient 4 mm Hg. Normal DVI. EOAi 1.08. Successful TAVR Placement. FINDINGS  Left Ventricle: Left ventricular ejection fraction, by estimation, is 55 to 60%. The left ventricle has normal function. Right Ventricle: The right ventricular size is moderately enlarged. No increase in right ventricular wall thickness. Right ventricular systolic function is normal. Mitral Valve: Calcified chords. The mitral valve is degenerative in appearance. Moderate mitral annular calcification. Mild to moderate mitral valve regurgitation. Tricuspid Valve: The tricuspid valve is normal in structure. Tricuspid valve regurgitation is mild . No evidence of tricuspid stenosis. Aortic Valve: Prior to procedure the valve was severel calcified. Mean gradient 45 mm Hg, Peak gradient 68 mm Hg, AVA 0.58 with DVI 0.27. Consistent with at least severe aortic stenosis. After procedure. Successful TAVR with a 26 mm Sapien Valve. There are two trivial jets of PVL. Mean gradient 4 mm Hg. Normal DVI. EOAi 1.08. Successful TAVR Placement. Aortic valve mean gradient measures 4.0 mmHg. Aortic valve peak gradient measures 6.9 mmHg. Aortic valve area, by VTI measures 2.25 cm. Additional Comments: Spectral Doppler performed. Color Doppler performed.  LEFT VENTRICLE PLAX  2D LVOT diam:     2.00 cm LV SV:         65 LV SV Index:   31 LVOT Area:     3.14 cm  AORTIC VALVE AV Area (Vmax):    2.35 cm AV Area (Vmean):   2.56 cm AV Area (VTI):     2.25 cm AV Vmax:           131.00 cm/s AV Vmean:          91.500 cm/s AV VTI:            0.288 m AV Peak Grad:      6.9 mmHg AV Mean Grad:      4.0 mmHg LVOT Vmax:         98.20 cm/s LVOT Vmean:        74.500 cm/s LVOT VTI:          0.206 m LVOT/AV VTI ratio: 0.72  SHUNTS  Systemic VTI:  0.21 m Systemic Diam: 2.00 cm Stanly Leavens MD Electronically signed by Stanly Leavens MD Signature Date/Time: 07/03/2024/11:06:37 AM    Final    Structural Heart Procedure Result Date: 07/03/2024 See surgical note for result.  DG Chest 2 View Result Date: 06/29/2024 EXAM: 2 VIEW(S) XRAY OF THE CHEST 06/29/2024 08:48:21 AM COMPARISON: CT angio chest 05/23/2024 CLINICAL HISTORY: Pre-op  exam [id number]. Pre-admit for cardiac procedure. Hx Aortic stenosis, severe (TAVR), A-FIB, HTN; ROVER. FINDINGS: LUNGS AND PLEURA: 8 mm nodular opacity in right lower lung zone, which is favored to represent nipple shadow artifact. No pulmonary edema. No pleural effusion. No pneumothorax. HEART AND MEDIASTINUM: Atherosclerotic calcifications of the aortic arch. No acute abnormality of the cardiac and mediastinal silhouettes. BONES AND SOFT TISSUES: Multilevel degenerative changes of thoracic spine. No acute osseous abnormality. IMPRESSION: 1. No acute cardiopulmonary findings. 2. Atherosclerotic calcifications of the aortic arch. 3. 8 mm nodular opacity in the right lower lung zone favored to represent a nipple shadow artifact. This could be confirmed with repeat chest radiograph with nipple markers. 4. Multilevel degenerative changes of the thoracic spine. Electronically signed by: Waddell Calk MD 06/29/2024 11:20 AM EDT RP Workstation: [id number]    ZXH:ajdzopwz EKG 9/19 showed AF with RBBB in the 70s (personally reviewed)  TELEMETRY: AF with CHB, V pacing  at 60, underlying escape in the 30s (personally reviewed)   Assessment/Plan:  CHB AF with slow VR, permanent Remains in CHB s/p TAVR 9/23 Given baseline conduction disease and position of valve, do not expect recovery.  Explained risks, benefits, and alternatives to PPM implantation, including but not limited to bleeding, infection, pneumothorax, pericardial effusion, lead dislodgement, heart attack, stroke, or death.  Pt verbalized understanding and agrees to proceed.      Coumadin  has been held for TAVR. INR 1.3 yesterday, pending today.  Severe AS s/p TAVR Otherwise stable post op course  CAD Stable, mild non-obstructive disease pre-TAVR  Hypomagnesemia Chronic Potassium4.1 (09/24 0234) Magnesium   1.2* (09/24 0234) Will supp pre PPM.   Dr. Cindie to see.    For questions or updates, please contact [facility] Please consult [email or web address] for contact info under     Signed, Ozell Prentice Passey, PA-C  07/04/2024, 9:27 AM

## 2024-07-04 NOTE — TOC Initial Note (Signed)
 Transition of Care [facility]) - Initial/Assessment Note    Patient Details  Name: Marvin Foster MRN: [id number] Date of Birth: Apr 15, 1934  [facility] CM/SW Contact:    Sudie Erminio Deems, RN Phone Number: 07/04/2024, 3:24 PM  Clinical Narrative: Patient s/p TAVR-developed CHB and plan for PPM implant today. PTA patient was from home alone. Daughter was at the bedside during the visit and she states patient will be going to his son's home post hospital. Patient does not have a history of HH services. Patient has DME: cane, rolling walker, and shower chair. Daughter states she can get a wheelchair if needed. Patient has insurance and PCP-no issues with transportation. Inpatient Case Manager will continue to follow for additional disposition needs as he progresses.                   Expected Discharge Plan: Home/Self Care Barriers to Discharge: Continued Medical Work up   Patient Goals and CMS Choice Patient states their goals for this hospitalization and ongoing recovery are:: Patient plans to return home with son          Expected Discharge Plan and Services   Discharge Planning Services: CM Consult Post Acute Care Choice: NA Living arrangements for the past 2 months: Apartment                   DME Agency: NA                  Prior Living Arrangements/Services Living arrangements for the past 2 months: Apartment Lives with:: Self (has support of daughter and son.) Patient language and need for interpreter reviewed:: Yes Do you feel safe going back to the place where you live?: Yes      Need for Family Participation in Patient Care: Yes (Comment) Care giver support system in place?: Yes (comment) Current home services: DME (patient has cane, rolling walker, and shower chair. Daughter states she can get a wheelchair if needed.) Criminal Activity/Legal Involvement Pertinent to Current Situation/Hospitalization: No - Comment as needed  Activities of  Daily Living   ADL Screening (condition at time of admission) Independently performs ADLs?: Yes (appropriate for developmental age) Is the patient deaf or have difficulty hearing?: Yes Does the patient have difficulty seeing, even when wearing glasses/contacts?: No Does the patient have difficulty concentrating, remembering, or making decisions?: No  Permission Sought/Granted Permission sought to share information with : Family Supports, Case Manager                Emotional Assessment Appearance:: Appears stated age Attitude/Demeanor/Rapport: Engaged Affect (typically observed): Appropriate Orientation: : Oriented to Self, Oriented to Place Alcohol / Substance Use: Not Applicable Psych Involvement: No (comment)  Admission diagnosis:  Aortic stenosis, severe [I35.0] S/P TAVR (transcatheter aortic valve replacement) [Z95.2] Patient Active Problem List   Diagnosis Date Noted   S/P TAVR (transcatheter aortic valve replacement) 07/03/2024   RBBB    Obesity (BMI 30-39.9) 01/17/2023   Lumbar radiculopathy 12/22/2022   Heme positive stool 09/16/2022   AVM (arteriovenous malformation) of small bowel, 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/16/2022   Benign neoplasm of ascending colon 09/16/2022   Benign neoplasm of transverse colon 09/16/2022   Benign neoplasm of descending colon 09/16/2022   Venous stasis of both lower extremities 05/28/2022   Severe aortic stenosis    Glaucoma 10/16/2020   Pain due to onychomycosis of toenail of left foot 05/28/2020   Hypothyroid 09/21/2018   Hyperlipidemia associated with type 2 diabetes mellitus (HCC)  08/05/2015   Coronary artery disease due to lipid rich plaque 08/05/2015   Hx of colonic polyps 06/12/2015   Chronic anticoagulation 06/12/2015   Atrophy, Fuchs' 05/07/2015   Permanent atrial fibrillation (HCC) 07/24/2013   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 [place] [address] Phone: [phone] Fax: [phone]     Social Drivers of Health (SDOH) Social History: SDOH Screenings   Food Insecurity: No Food Insecurity (07/04/2024)  Housing: High Risk (07/04/2024)  Transportation Needs: No Transportation Needs (07/04/2024)  Utilities: Not At Risk (07/04/2024)  Alcohol Screen: Low Risk  (06/13/2024)  Depression (PHQ2-9): Low Risk  (06/13/2024)  Financial Resource Strain: Low Risk  (06/13/2024)  Physical Activity: Inactive (06/13/2024)  Social Connections: Moderately Isolated (07/04/2024)  Stress: No Stress Concern Present (06/13/2024)  Tobacco Use: Low Risk  (07/03/2024)  Health Literacy: Adequate Health Literacy (06/13/2024)   SDOH Interventions:     Readmission Risk Interventions     No data to display

## 2024-07-05 ENCOUNTER — Other Ambulatory Visit (HOSPITAL_COMMUNITY): Payer: Self-pay

## 2024-07-05 ENCOUNTER — Inpatient Hospital Stay (HOSPITAL_COMMUNITY)

## 2024-07-05 DIAGNOSIS — R918 Other nonspecific abnormal finding of lung field: Secondary | ICD-10-CM | POA: Diagnosis not present

## 2024-07-05 DIAGNOSIS — I700 Atherosclerosis of aorta: Secondary | ICD-10-CM | POA: Diagnosis not present

## 2024-07-05 DIAGNOSIS — J189 Pneumonia, unspecified organism: Secondary | ICD-10-CM | POA: Insufficient documentation

## 2024-07-05 DIAGNOSIS — Z950 Presence of cardiac pacemaker: Secondary | ICD-10-CM | POA: Diagnosis not present

## 2024-07-05 DIAGNOSIS — I350 Nonrheumatic aortic (valve) stenosis: Secondary | ICD-10-CM | POA: Diagnosis not present

## 2024-07-05 DIAGNOSIS — Z8616 Personal history of COVID-19: Secondary | ICD-10-CM | POA: Diagnosis not present

## 2024-07-05 DIAGNOSIS — Z952 Presence of prosthetic heart valve: Secondary | ICD-10-CM | POA: Diagnosis not present

## 2024-07-05 DIAGNOSIS — I442 Atrioventricular block, complete: Secondary | ICD-10-CM | POA: Diagnosis not present

## 2024-07-05 LAB — URINALYSIS, ROUTINE W REFLEX MICROSCOPIC
Bilirubin Urine: NEGATIVE
Glucose, UA: NEGATIVE mg/dL
Hgb urine dipstick: NEGATIVE
Ketones, ur: NEGATIVE mg/dL
Leukocytes,Ua: NEGATIVE
Nitrite: NEGATIVE
Protein, ur: NEGATIVE mg/dL
Specific Gravity, Urine: 1.021 (ref 1.005–1.030)
pH: 5 (ref 5.0–8.0)

## 2024-07-05 LAB — CBC WITH DIFFERENTIAL/PLATELET
Abs Immature Granulocytes: 0.05 K/uL (ref 0.00–0.07)
Basophils Absolute: 0 K/uL (ref 0.0–0.1)
Basophils Relative: 0 %
Eosinophils Absolute: 0.1 K/uL (ref 0.0–0.5)
Eosinophils Relative: 1 %
HCT: 32 % — ABNORMAL LOW (ref 39.0–52.0)
Hemoglobin: 10.8 g/dL — ABNORMAL LOW (ref 13.0–17.0)
Immature Granulocytes: 0 %
Lymphocytes Relative: 10 %
Lymphs Abs: 1.1 K/uL (ref 0.7–4.0)
MCH: 31.9 pg (ref 26.0–34.0)
MCHC: 33.8 g/dL (ref 30.0–36.0)
MCV: 94.4 fL (ref 80.0–100.0)
Monocytes Absolute: 1.2 K/uL — ABNORMAL HIGH (ref 0.1–1.0)
Monocytes Relative: 10 %
Neutro Abs: 9.1 K/uL — ABNORMAL HIGH (ref 1.7–7.7)
Neutrophils Relative %: 79 %
Platelets: 106 K/uL — ABNORMAL LOW (ref 150–400)
RBC: 3.39 MIL/uL — ABNORMAL LOW (ref 4.22–5.81)
RDW: 14.6 % (ref 11.5–15.5)
WBC: 11.5 K/uL — ABNORMAL HIGH (ref 4.0–10.5)
nRBC: 0 % (ref 0.0–0.2)

## 2024-07-05 LAB — BASIC METABOLIC PANEL WITH GFR
Anion gap: 8 (ref 5–15)
BUN: 16 mg/dL (ref 8–23)
CO2: 24 mmol/L (ref 22–32)
Calcium: 8.1 mg/dL — ABNORMAL LOW (ref 8.9–10.3)
Chloride: 99 mmol/L (ref 98–111)
Creatinine, Ser: 1.16 mg/dL (ref 0.61–1.24)
GFR, Estimated: >60 mL/min (ref >60)
Glucose, Bld: 144 mg/dL — ABNORMAL HIGH (ref 70–99)
Potassium: 4.1 mmol/L (ref 3.5–5.1)
Sodium: 131 mmol/L — ABNORMAL LOW (ref 135–145)

## 2024-07-05 LAB — GLUCOSE, CAPILLARY
Glucose-Capillary: 126 mg/dL — ABNORMAL HIGH (ref 70–99)
Glucose-Capillary: 181 mg/dL — ABNORMAL HIGH (ref 70–99)

## 2024-07-05 LAB — MAGNESIUM: Magnesium: 2.8 mg/dL — ABNORMAL HIGH (ref 1.7–2.4)

## 2024-07-05 LAB — PROCALCITONIN: Procalcitonin: <0.1 ng/mL

## 2024-07-05 MED ORDER — SODIUM CHLORIDE 0.9 % IV SOLN
1.0000 g | INTRAVENOUS | Status: DC
  Administered 2024-07-05: 1 g via INTRAVENOUS
  Filled 2024-07-05: qty 10

## 2024-07-05 MED ORDER — CEFUROXIME AXETIL 500 MG PO TABS
500.0000 mg | ORAL_TABLET | Freq: Two times a day (BID) | ORAL | 0 refills | Status: DC
  Filled 2024-07-05: qty 14, 7d supply, fill #0

## 2024-07-05 MED ORDER — PHENOL 1.4 % MT LIQD: 1.0000 | OROMUCOSAL | Status: DC | PRN

## 2024-07-05 MED FILL — Midazolam HCl Inj 2 MG/2ML (Base Equivalent): INTRAMUSCULAR | Qty: 0.5 | Status: AC

## 2024-07-05 NOTE — TOC Transition Note (Signed)
 Transition of Care [facility]) - Discharge Note   Patient Details  Name: Marvin Foster MRN: [id number] Date of Birth: [birth year]  [facility]) CM/SW Contact:  Sudie Erminio Deems, RN Phone Number: 07/05/2024, 12:27 PM   Clinical Narrative:  Patient s/p TAVR and will transition home today. No home needs identified by Inpatient Case Manager.    Final next level of care: Home/Self Care Barriers to Discharge: No Barriers Identified   Patient Goals and CMS Choice Patient states their goals for this hospitalization and ongoing recovery are:: Patient plans to return home with son  Discharge Plan and Services Additional resources added to the After Visit Summary for     Discharge Planning Services: CM Consult Post Acute Care Choice: NA            DME Agency: NA  Social Drivers of Health (SDOH) Interventions SDOH Screenings   Food Insecurity: No Food Insecurity (07/04/2024)  Housing: High Risk (07/04/2024)  Transportation Needs: No Transportation Needs (07/04/2024)  Utilities: Not At Risk (07/04/2024)  Alcohol Screen: Low Risk  (06/13/2024)  Depression (PHQ2-9): Low Risk  (06/13/2024)  Financial Resource Strain: Low Risk  (06/13/2024)  Physical Activity: Inactive (06/13/2024)  Social Connections: Moderately Isolated (07/04/2024)  Stress: No Stress Concern Present (06/13/2024)  Tobacco Use: Low Risk  (07/03/2024)  Health Literacy: Adequate Health Literacy (06/13/2024)   Readmission Risk Interventions     No data to display

## 2024-07-05 NOTE — Progress Notes (Unsigned)
 [facility]   MULTIDISCIPLINARY HEART VALVE CLINIC                                     Cardiology Office Note:    Date:  07/09/2024   ID:  Marvin Foster, DOB [birth year], MRN [id number]  PCP:  Marvin Comer BRAVO, MD  [facility] Cardiologist:  Oneil Parchment, MD  [facility] Structural heart: Marvin Cash, MD [facility] Electrophysiologist:  Marvin ONEIDA HOLTS, MD   Referring MD: Marvin Comer BRAVO, MD   [facility] s/p TAVR  History of Present Illness:    Marvin Foster is a 89 y.o. 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    He has been followed by Dr. Cash in the valve clinic since at least 2024 for severe asymptomatic LFLG AS. Echo 12/19/23 with LVEF 60-65%, mild to moderate MR and LFLG severe AS with mean gradient 25 mmHg, AVA 0.58 cm2, DI 0.2, SVI 26. LHC 05/16/24 showed mild non obst CAD. S/p successful TAVR with a 26 mm Edwards Sapien 3 Ultra Resilia THV via the TF approach on 07/03/24. Post operative echo showed EF 70%, normally functioning TAVR with a mean gradient of 8 mmHg and no PVL. He developed post op CHB s/p single chamber permanent pacemaker with left bundle area lead by Dr. HOLTS on 07/04/24. Coumadin  held for 5 days. He developed fevers and chills and CXR suspicious for PNA. Discharged on Ceftin  500mg  BID.   Today the patient presents to clinic for follow up. Here with daughter. He is doing okay. Had some hallucinations the days following discharge that have improved. They attributed it to the Ceftin . Now much better. No CP or SOB. No LE edema, orthopnea or PND. No dizziness or syncope. No blood in stool or urine. No palpitations.     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 Cash and Dr. Shyrl   Scrotal mass 03/07/2012   Spinal  stenosis 01/20/2005   Tubular adenoma of colon      Current Medications: Current Meds  Medication Sig   allopurinol  (ZYLOPRIM ) 100 MG tablet Take 1 tablet (100 mg total) by mouth daily.   amLODipine  (NORVASC ) 2.5 MG tablet Take 1 tablet by mouth once daily   atorvastatin  (LIPITOR) 40 MG tablet TAKE 1 TABLET BY MOUTH AT BEDTIME   BESIVANCE 0.6 % SUSP Place 1 drop into the left eye See admin instructions. Administer 1 drop into the left eye four times daily for 2 days. Start the day before eye injection and continue for 1 day following.   CALCIUM  PO Take 1 tablet by mouth daily.   cefUROXime  (CEFTIN ) 500 MG tablet Take 1 tablet (500 mg total) by mouth 2 (two) times daily with a meal.   cetirizine  (ZYRTEC ) 10 MG tablet Take 1 tablet by mouth once daily (Patient taking differently: Take 10 mg by mouth daily. As needed)   diclofenac  Sodium  (VOLTAREN ) 1 % GEL Apply 2 g topically 4 (four) times daily.   Ferrous Sulfate (IRON PO) Take 1 tablet by mouth daily.   furosemide  (LASIX ) 40 MG tablet Take 1 tablet by mouth once daily   glucose blood (ONETOUCH VERIO) test strip USE 1 STRIP TO CHECK GLUCOSE TWICE DAILY AS DIRECTED   Lancets (ONETOUCH ULTRASOFT) lancets Use as instructed   latanoprost  (XALATAN ) 0.005 % ophthalmic solution Place 1 drop into both eyes at bedtime.   levothyroxine  (SYNTHROID ) 75 MCG tablet Take 1 tablet by mouth once daily   metFORMIN  (GLUCOPHAGE ) 1000 MG tablet Take 1 tablet by mouth twice daily   metoprolol  tartrate (LOPRESSOR ) 100 MG tablet Take 1 tablet by mouth twice daily   Multiple Vitamins-Minerals (MULTIVITAMIN MEN 50+) TABS Take 1 tablet by mouth daily.   Multiple Vitamins-Minerals (ZINC PO) Take 1 tablet by mouth daily.   tamsulosin  (FLOMAX ) 0.4 MG CAPS capsule Take 1 capsule by mouth once daily   timolol  (TIMOPTIC ) 0.5 % ophthalmic solution Place 1 drop into both eyes daily.       ROS:   Please see the history of present illness.    All other systems reviewed and are negative.  EKGs       Risk Assessment/Calculations:    CHA2DS2-VASc Score =     This indicates a  % annual risk of stroke. The patient's score is based upon:           Physical Exam:    VS:  BP (!) 112/44   Pulse (!) 59   Ht 5' 8 (1.727 m)   Wt 209 lb (94.8 kg)   SpO2 96%   BMI 31.78 kg/m     Wt Readings from Last 3 Encounters:  07/09/24 209 lb (94.8 kg)  07/05/24 200 lb 13.4 oz (91.1 kg)  06/13/24 204 lb (92.5 kg)     GEN: Well nourished, well developed in no acute distress NECK: No JVD CARDIAC: RRR, soft flow murmur @ RUSB. No rubs, gallops RESPIRATORY:  Clear to auscultation without rales, wheezing or rhonchi  ABDOMEN: Soft, non-tender, non-distended EXTREMITIES:  No edema; No deformity.  Groin sites clear without hematoma or ecchymosis.   ASSESSMENT:    1. S/P TAVR (transcatheter aortic valve  replacement)   2. S/P placement of cardiac pacemaker   3. Atrial fibrillation, unspecified type (HCC)   4. Coronary artery disease involving native coronary artery of native heart without angina pectoris   5. Pneumonia due to infectious organism, unspecified laterality, unspecified part of  lung     PLAN:    In order of problems listed above:  Severe AS s/p TAVR:  -- Pt doing okay s/p TAVR.  -- Groin sites healing well.  -- SBE prophylaxis discussed; I have RX'd azithromycin  due to a PCN allergy.  -- Resume Coumadin  tomorrow.  -- Cleared to resume all activities without restriction from my standpoint, but still had pacer restrictions.  -- I will see back for 1 month echo and OV.  S/p PPM: -- With CHB after TAVR.  -- S/p PPM with a single chamber permanent pacemaker with left bundle area lead by Dr. Cindie on 07/04/24. -- Wound check 10/8.   Persistent atrial fibrillation: -- Continue Lopressor  100mg  BID. -- Resume Coumadin  tomorrow 07/10/2024 -- Coumadin  clinic apt moved out to 10/8.   CAD: -- S/p LAD stent in 2006 -- LHC 05/16/24 showed mild non obst CAD.  -- Continue medical therapy.  -- Continue atorvastatin  40mg  daily.  -- No aspirin  givne OAC.     PNA on CXR: -- Continue Ceftin  500mg  BID. Only has two more days of this.  -- No move fever or chills. Cough improved.   HTN: -- BP well controlled.  -- Continue Norvasc  2.5mg  daily.      Cardiac Rehabilitation Eligibility Assessment       Medication Adjustments/Labs and Tests Ordered: Current medicines are reviewed at length with the patient today.  Concerns regarding medicines are outlined above.  No orders of the defined types were placed in this encounter.  No orders of the defined types were placed in this encounter.   There are no Patient Instructions on file for this visit.   Signed, Lamarr Hummer, PA-C  07/09/2024 10:12 AM    [facility]

## 2024-07-05 NOTE — Progress Notes (Addendum)
  Patient Name: Marvin Foster Date of Encounter: 07/05/2024  Primary Cardiologist: Oneil Parchment, MD Electrophysiologist: OLE ONEIDA HOLTS, MD  Interval Summary   Doing OK this am.   Fever noted overnight.  CXR concerning for PNA and started on ABx.   Vital Signs    Vitals:   07/05/24 0445 07/05/24 0500 07/05/24 0600 07/05/24 0700  BP:  (!) 118/52 (!) 137/52 (!) 135/47  Pulse:  60 (!) 56 (!) 57  Resp:  (!) 21 17 18   Temp:      TempSrc:      SpO2:  92% 95% 96%  Weight: 91.1 kg     Height:        Intake/Output Summary (Last 24 hours) at 07/05/2024 0805 Last data filed at 07/05/2024 0400 Gross per 24 hour  Intake 528.81 ml  Output 725 ml  Net -196.19 ml   Filed Weights   07/03/24 0547 07/04/24 0500 07/05/24 0445  Weight: 93.4 kg 94 kg 91.1 kg    Physical Exam    GEN- NAD, Alert and oriented  Lungs- Clear to ausculation bilaterally, normal work of breathing Cardiac- Regular rate and rhythm, no murmurs, rubs or gallops GI- soft, NT, ND, + BS Extremities- no clubbing or cyanosis. No edema  Telemetry    AF with V pacing at 60 (personally reviewed)  Hospital Course    SI JACHIM is a 89 y.o. male with a history of RBBB, Severe AS, DM2, HTN, permanent AF and s/p LAD stent 2006 CAD who is being seen today for the evaluation of post operative CHB at the request of Dr. Verlin.   Assessment & Plan    CHB AF with slow VR, permanent S/p [employer] single chamber PPM CXR stable Interrogation pending Wound care and restrictions reviewed with Daughter.   Usual follow up in place  Severe AS s/p TAVR Otherwise stable post op course  CAD No s/s of ischemia.     Fever Possible PNA Tmax 101.9. Started on ABx for possible PNA by CXR Add IS  Hypomagnesemia Potassium4.1 (09/25 0140) Recheck Mg after supp yesterday   For questions or updates, please contact [facility] Please consult [email or web address] for contact info under      Signed, Ozell Prentice Passey, PA-C  07/05/2024, 8:05 AM

## 2024-07-05 NOTE — Progress Notes (Addendum)
 Paged about new fever of 101.50F and 101.69F, treated with tylenol  and now back down to 969F. Looking back at his temperatures, he has been running 99-100F this hospitalization, but none as high as this. Went to see patient at bedside, he has no complaints. Denies pain, SOB, abdominal pain, diarrhea, nausea, vomiting, rash, previously with some burning with urination but now no longer (of note, did receive a day of ancef  on 9/23 and vanc 9/24), device site appears clean without discharge, and femoral access sites seem benign as well. His son did note that he seems to have a dry cough. Early post-op fever of unknown etiology, currently SIRS negative (positive for fever only, WBC pending) and qSOFA negative. Will repeat labs now to assess for new leukocytosis, UA to assess for UTI, get his CXR early to assess for new aspiration pneumonia/pneumonitis after his recent surgeries, add-on procalcitonin to help gauge a new bacterial pneumonia given his cough, DVT is a consideration given subtherapeutic INRs on warfarin, but that would be quite odd. If workup comes back negative and fever does not re-surface again, then could be related to stress-mediated response, but that would be a diagnosis of exclusion. Will also gather blood cultures to rule out occult bacteremia, but hold off on starting empiric antibiotics unless fever persistent, new infiltrate on CXR, or develops SIRS/qSOFA to raise further concern for occult infection.  Addendum 2:30am: CBC showing slight elevation in WBC to 11.5, CXR with what appears to be a more focal consolidation in the RLL. Given these data, along with new cough, will opt to treat for a pneumonia with ceftriaxone . The procalcitonin should help differentiate between new bacterial pneumonia vs viral and can aid in deescalating antibiotics.   Curtistine LITTIE Farr, MD Cardiology 07/05/2024

## 2024-07-05 NOTE — Progress Notes (Addendum)
 Pt received education regarding TAVR, restrictions, HH diet, ex guidelines, and CRP2.  Referral placed to [facility].   Garen FORBES Candy MS, ACSM-CEP 07/05/2024 10:51 AM  [phone]

## 2024-07-06 ENCOUNTER — Telehealth: Payer: Self-pay | Admitting: *Deleted

## 2024-07-06 ENCOUNTER — Encounter: Payer: Self-pay | Admitting: Family Medicine

## 2024-07-06 ENCOUNTER — Other Ambulatory Visit: Payer: Self-pay | Admitting: Family Medicine

## 2024-07-06 ENCOUNTER — Telehealth: Payer: Self-pay

## 2024-07-06 MED ORDER — ONETOUCH VERIO VI STRP: ORAL_STRIP | 0 refills | Status: DC

## 2024-07-06 NOTE — Telephone Encounter (Signed)
 Patient contacted regarding discharge from [facility] on 07/05/24.  Patient understands to follow up with provider Izetta Hummer, PA-C on 07/09/24 at 9:55AM at [address] Location. Patient understands discharge instructions? Yes Patient understands medications and regiment? Yes Patient understands to bring all medications to this visit? Yes

## 2024-07-06 NOTE — Transitions of Care (Post Inpatient/ED Visit) (Signed)
 07/06/2024  Name: Marvin Foster MRN: [id number] DOB: 08-31-1934  Today's TOC FU Call Status: Today's TOC FU Call Status:: Successful TOC FU Call Completed TOC FU Call Complete Date: 07/06/24 Patient's Name and Date of Birth confirmed.  Transition Care Management Follow-up Telephone Call Date of Discharge: 07/05/24 Discharge Facility: [facility]) Type of Discharge: Inpatient Admission Primary Inpatient Discharge Diagnosis:: Surgical TAVR with PPM insertion; pneumonia How have you been since you were released from the hospital?: Better (per son: He is doing very well, can't use his arm after the pacemaker was put in- so currently, I am helping him up from bed; he is staying with me after the surgery, so we are right here if he needs anything) Any questions or concerns?: No  Items Reviewed: Did you receive and understand the discharge instructions provided?: Yes (thoroughly reviewed with patient's son/ caregiver who verbalizes good understanding of same) Medications obtained,verified, and reconciled?: Yes (Medications Reviewed) (Full medication reconciliation/ review completed; no concerns or discrepancies identified; confirmed patient obtained/ is taking all newly Rx'd medications as instructed; family-manages medications and denies questions/ concerns around medications today) Any new allergies since your discharge?: No Dietary orders reviewed?: Yes Type of Diet Ordered:: Per son:  He tries to eat as healthy as he can Do you have support at home?: Yes People in Home [RPT]: alone Name of Support/Comfort Primary Source: Per son/ caregiver:  Reports patient independent in self-care activities at baseline; resides alone at baseline; temporarily residing with son post- recent surgery- son/ daughter assists as/ if needed/ indicated  Medications Reviewed Today: Medications Reviewed Today     Reviewed by Ellianna Ruest M, RN (Registered Nurse) on 07/06/24 at 1012  Med List Status:  <None>   Medication Order Taking? Sig Documenting Provider Last Dose Status Informant  allopurinol  (ZYLOPRIM ) 100 MG tablet [id number] Yes Take 1 tablet (100 mg total) by mouth daily. Tabori, Katherine E, MD  Active Self, Pharmacy Records  amLODipine  (NORVASC ) 2.5 MG tablet [id number] Yes Take 1 tablet by mouth once daily Jeffrie Oneil BROCKS, MD  Active Self, Pharmacy Records  atorvastatin  (LIPITOR) 40 MG tablet [id number] Yes TAKE 1 TABLET BY MOUTH AT BEDTIME Tabori, Katherine E, MD  Active Self, Pharmacy Records  BESIVANCE 0.6 % SUSP [id number] Yes Place 1 drop into the left eye See admin instructions. Administer 1 drop into the left eye four times daily for 2 days. Start the day before eye injection and continue for 1 day following. [provider]  Active Self, Pharmacy Records  CALCIUM  PO [id number] Yes Take 1 tablet by mouth daily. [provider]  Active Self, Pharmacy Records  cefUROXime  (CEFTIN ) 500 MG tablet [id number] Yes Take 1 tablet (500 mg total) by mouth 2 (two) times daily with a meal. Sebastian Lamarr JONELLE, PA-C  Active   cetirizine  (ZYRTEC ) 10 MG tablet [id number] Yes Take 1 tablet by mouth once daily Tabori, Katherine E, MD  Active Self, Pharmacy Records  diclofenac  Sodium (VOLTAREN ) 1 % GEL [id number] Yes Apply 2 g topically 4 (four) times daily. [name] BRAVO, MD  Active Self, Pharmacy Records  Ferrous Sulfate (IRON PO) [id number] Yes Take 1 tablet by mouth daily. [provider]  Active Self, Pharmacy Records  furosemide  (LASIX ) 40 MG tablet [id number] Yes Take 1 tablet by mouth once daily Tabori, Katherine E, MD  Active Self, Pharmacy Records  glucose blood [facility] VERIO) test strip [id number] Yes Use as instructed Tabori, Katherine E, MD  Active   Lancets [facility] ULTRASOFT) lancets  [id number] Yes Use as instructed Tabori, Katherine E, MD  Active Self, Pharmacy Records  latanoprost  (XALATAN ) 0.005 % ophthalmic solution [id number] Yes Place 1 drop into both eyes  at bedtime. [provider]  Active Self, Pharmacy Records  levothyroxine  (SYNTHROID ) 75 MCG tablet [id number] Yes Take 1 tablet by mouth once daily Tabori, Katherine E, MD  Active Self, Pharmacy Records  metFORMIN  (GLUCOPHAGE ) 1000 MG tablet [id number] Yes Take 1 tablet by mouth twice daily Tabori, Katherine E, MD  Active Self, Pharmacy Records  metoprolol  tartrate (LOPRESSOR ) 100 MG tablet [id number] Yes Take 1 tablet by mouth twice daily Tabori, Katherine E, MD  Active Self, Pharmacy Records  Multiple Vitamins-Minerals (MULTIVITAMIN MEN 50+) TABS [id number] Yes Take 1 tablet by mouth daily. [provider]  Active Self, Pharmacy Records  Multiple Vitamins-Minerals (ZINC PO) [id number] Yes Take 1 tablet by mouth daily. [provider]  Active Self, Pharmacy Records  Omega-3 Fatty Acids (FISH OIL PO) [id number] Yes Take 2 capsules by mouth daily.  Patient taking differently: Take 2 capsules by mouth daily. 07/06/24: Reports during TOC call- currently on hold until patient has scheduled swallow test   [provider]  Active Self, Pharmacy Records  tamsulosin  (FLOMAX ) 0.4 MG CAPS capsule [id number] Yes Take 1 capsule by mouth once daily Tabori, Katherine E, MD  Active Self, Pharmacy Records  timolol  (TIMOPTIC ) 0.5 % ophthalmic solution [id number] Yes Place 1 drop into both eyes daily.  [provider]  Active Self, Pharmacy Records  tolterodine  (DETROL ) 1 MG tablet [id number] Yes Take 1 tablet (1 mg total) by mouth 2 (two) times daily. Tabori, Katherine E, MD  Active Self, Pharmacy Records  warfarin (COUMADIN ) 2.5 MG tablet [id number] Yes TAKE 1/2 TO 1 (ONE-HALF TO ONE) TABLET BY MOUTH ONCE DAILY OR  AS  DIRECTED  BY  ANTICOAGULATION  CLINIC  Patient taking differently: Take 1.25-2.5 mg by mouth See admin instructions. 07/06/24: Verified during TOC call-- coumadin  currently on hold after recent TAVR until 07/10/24  Take 1/2 tablet (1.25mg ) by mouth on Saturday and  take 1 tablet (2.5mg ) on all other days.   Jeffrie Oneil BROCKS, MD  Active Self, Pharmacy Records           Home Care and Equipment/Supplies: Were Home Health Services Ordered?: No Any new equipment or medical supplies ordered?: No  Functional Questionnaire: Do you need assistance with bathing/showering or dressing?: Yes (son assisting as indicated) Do you need assistance with meal preparation?: Yes (son currently preparing meals- patient able to prepare meals for self at baseline) Do you need assistance with eating?: No Do you have difficulty maintaining continence: No Do you need assistance with getting out of bed/getting out of a chair/moving?: Yes (son assists as indicated: movement restrictions after recent surgery for PPM) Do you have difficulty managing or taking your medications?: Yes (Family manages all aspects of medication administration as per baseline)  Follow up appointments reviewed: PCP Follow-up appointment confirmed?: No (son declines offer of scheduling with PCP: they did not tell us  we need to see the PCP- they just said the surgeon needs to follow up; I will call Dr. Mahlon if anything comes up that we need her for as he recuperates, but not now, there is no need) MD Provider Line Number:[phone] Given: No (verified well-established with current PCP) Specialist Hospital Follow-up appointment confirmed?: Yes Date of Specialist follow-up appointment?: 07/09/24 Follow-Up Specialty Provider:: cardiology surgical provider Do you need transportation to your follow-up appointment?: No Do you understand care options  if your condition(s) worsen?: Yes-patient verbalized understanding  SDOH Interventions Today    Flowsheet Row Most Recent Value  SDOH Interventions   Food Insecurity Interventions Intervention Not Indicated  [per son/ caregiver]  Housing Interventions Intervention Not Indicated  [per son/ caregiver: NO housing concerns as per prior assessment: His house  has been paid for and there are no concerns]  Transportation Interventions Intervention Not Indicated  [per son/ caregiver: family members provide all transportation, now and at baseline]  Utilities Interventions Intervention Not Indicated  [per son/ caregiver]   See TOC assessment tabs for additional assessment/ TOC intervention information  Successfully enrolled into 30-day TOC program  Pls call/ message for questions,  Julianne Chamberlin Mckinney Jeron Grahn, RN, BSN, Media planner  Transitions of Care  [facility] [phone]: direct office

## 2024-07-06 NOTE — Patient Instructions (Signed)
 Visit Information  Thank you for taking time to visit with me today. Please don't hesitate to contact me if I can be of assistance to you before our next scheduled telephone appointment.  Our next appointment is by telephone on Thursday July 12, 2024 at 11:30 am  Please call the care guide team at [phone] if you need to cancel or reschedule your appointment.   Patient Self Care Activities:  Attend all scheduled provider appointments Call provider office for new concerns or questions  Participate in Transition of Care Program/Attend TOC scheduled calls Take medications as prescribed   Continue pacing activity as your recuperation from recent surgery continues Continue monitoring and recording your blood sugars at home Use assistive devices as needed to prevent falls If you believe your condition is getting worse- contact your care providers (doctors) promptly- reaching out to your doctor early when you have concerns can prevent you from having to go to the hospital  Following is a copy of your care plan:   Goals Addressed             This Visit's Progress    VBCI Transitions of Care (TOC) Care Plan   On track    Problems:  Recent Hospitalization for treatment of Aortic Stenosis- TAVR with CHB post-op: PPM insertion Recent hospitalization September 23-25, 2025 for surgical TAVR and 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; pneumonia Independent in self- care at baseline; resides alone at baseline- supportive family assists as indicated- manages medications- uses walker at baseline; patient currently residing with son [name] post- recent hospital discharge 0 unplanned hospital admissions x last (6)/ (12) months  Goal:  Over the next 30 days, the patient will not experience hospital readmission  Interventions:  Transitions of Care: week # 1/ day # 1 Durable Medical Equipment (DME) needs assessed with patient/caregiver Doctor Visits  - discussed the importance of  doctor visits Communication with PCP re: TOC 30-day program enrollment Post discharge activity limitations prescribed by provider reviewed Post-op wound/incision care reviewed with patient/caregiver Reviewed Signs and symptoms of infection Reinforced signs/ symptoms incision site infection along with corresponding action plan should signs/ symptoms develop Confirmed patient monitors blood sugars at home - fasting every day: has not yet resumed blood sugar monitoring- plans to do so later today Provided my direct contact information should questions/ concerns/ needs arise post-TOC initial call, prior to next [facility] 30-day program RN CM telephone visit    Surgery (TAVR/ PPM): Evaluation of current treatment plan related to TAVR/ PPM surgery assessed patient/caregiver understanding of surgical procedure   reviewed post-operative instructions with patient/caregiver addressed questions about post - surgical incision care  reviewed medications with patient and addressed questions reviewed scheduled provider appointments with patientcardiology surgical provider Monday 07/09/24 confirmed availability of transportation to all appointments son to provide transportation  Patient Self Care Activities:  Attend all scheduled provider appointments Call provider office for new concerns or questions  Participate in Transition of Care Program/Attend [facility] scheduled calls Take medications as prescribed   Continue pacing activity as your recuperation from recent surgery continues Continue monitoring and recording your blood sugars at home Use assistive devices as needed to prevent falls If you believe your condition is getting worse- contact your care providers (doctors) promptly- reaching out to your doctor early when you have concerns can prevent you from having to go to the hospital  Plan:  Telephone follow up appointment with care management team member scheduled for:  Thursday 07/12/24  Patient's  caregiver verbalizes understanding of instructions and care plan provided today and agrees to view in MyChart. Active MyChart status and patient understanding of how to access instructions and care plan via MyChart confirmed with patient.     If you are experiencing a Mental Health or Behavioral Health Crisis or need someone to talk to, please  call the Suicide and Crisis Lifeline: 988 call the [place]  National Suicide Prevention Lifeline: 623 342 3578 or TTY: 539-691-7867 TTY 906-869-6723) to talk to a trained counselor call 1-800-273-TALK (toll free, 24 hour hotline) go to [facility] [address], [place] [phone]) call the [facility] Crisis Line: [phone] call 911   Pls call/ message for questions,  Williemae Muriel Mckinney Zayleigh Stroh, RN, BSN, CCRN Alumnus RN Care Manager  Transitions of Care  VBCI - [facility] [phone]: direct office

## 2024-07-06 NOTE — Telephone Encounter (Signed)
 Copied from CRM #[id number]. Topic: Clinical - Prescription Issue >> Jul 06, 2024  9:31 AM Thersia BROCKS wrote: Reason for CRM: Reem from [employer] called in regarding th  glucose blood (ONETOUCH VERIO) test strip  needs to know how often patien this using daily

## 2024-07-09 ENCOUNTER — Ambulatory Visit: Attending: Physician Assistant | Admitting: Physician Assistant

## 2024-07-09 VITALS — BP 112/44 | HR 59 | Ht 68.0 in | Wt 209.0 lb

## 2024-07-09 DIAGNOSIS — Z950 Presence of cardiac pacemaker: Secondary | ICD-10-CM

## 2024-07-09 DIAGNOSIS — I10 Essential (primary) hypertension: Secondary | ICD-10-CM

## 2024-07-09 DIAGNOSIS — I4891 Unspecified atrial fibrillation: Secondary | ICD-10-CM

## 2024-07-09 DIAGNOSIS — Z952 Presence of prosthetic heart valve: Secondary | ICD-10-CM | POA: Diagnosis not present

## 2024-07-09 DIAGNOSIS — I2510 Atherosclerotic heart disease of native coronary artery without angina pectoris: Secondary | ICD-10-CM | POA: Diagnosis not present

## 2024-07-09 DIAGNOSIS — J189 Pneumonia, unspecified organism: Secondary | ICD-10-CM | POA: Diagnosis not present

## 2024-07-09 MED ORDER — AZITHROMYCIN 500 MG PO TABS: 500.0000 mg | ORAL_TABLET | ORAL | 12 refills | Status: DC

## 2024-07-09 NOTE — Patient Instructions (Signed)
 Medication Instructions:  Your physician has recommended you make the following change in your medication: START Azithromycin  500mg , taking 1 tablet by mouth 1 hour prior to dental procedures and cleanings.    *If you need a refill on your cardiac medications before your next appointment, please call your pharmacy*  Lab Work: None needed If you have labs (blood work) drawn today and your tests are completely normal, you will receive your results only by: MyChart Message (if you have MyChart) OR A paper copy in the mail If you have any lab test that is abnormal or we need to change your treatment, we will call you to review the results.  Testing/Procedures: 08/09/24 Your physician has requested that you have an echocardiogram. Echocardiography is a painless test that uses sound waves to create images of your heart. It provides your doctor with information about the size and shape of your heart and how well your heart's chambers and valves are working. This procedure takes approximately one hour. There are no restrictions for this procedure. Please do NOT wear cologne, perfume, aftershave, or lotions (deodorant is allowed). Please arrive 15 minutes prior to your appointment time.  Please note: We ask at that you not bring children with you during ultrasound (echo/ vascular) testing. Due to room size and safety concerns, children are not allowed in the ultrasound rooms during exams. Our front office staff cannot provide observation of children in our lobby area while testing is being conducted. An adult accompanying a patient to their appointment will only be allowed in the ultrasound room at the discretion of the ultrasound technician under special circumstances. We apologize for any inconvenience.   Follow-Up: At [facility], you and your health needs are our priority.  As part of our continuing mission to provide you with exceptional heart care, our providers are all part of one team.   This team includes your primary Cardiologist (physician) and Advanced Practice Providers or APPs (Physician Assistants and Nurse Practitioners) who all work together to provide you with the care you need, when you need it.  Your next appointment:   As scheduled on 08/09/24  Provider:   Izetta Hummer, PA-C  We recommend signing up for the patient portal called MyChart.  Sign up information is provided on this After Visit Summary.  MyChart is used to connect with patients for Virtual Visits (Telemedicine).  Patients are able to view lab/test results, encounter notes, upcoming appointments, etc.  Non-urgent messages can be sent to your provider as well.   To learn more about what you can do with MyChart, go to [email or web address].

## 2024-07-09 NOTE — Progress Notes (Signed)
 Okay after his pacemaker check on 10/8

## 2024-07-10 LAB — CULTURE, BLOOD (ROUTINE X 2)
Culture: NO GROWTH
Culture: NO GROWTH
Special Requests: ADEQUATE
Special Requests: ADEQUATE

## 2024-07-11 ENCOUNTER — Telehealth (HOSPITAL_COMMUNITY): Payer: Self-pay

## 2024-07-11 NOTE — Telephone Encounter (Signed)
 Called patient regarding cardiac rehab, spoke with patient's daughter Zebedee who handles his medical appts. Went over program, she stated he may be able to do 2x days per week but needed to discuss it with her father and her brother before committing to the program. Informed her he needs to attend his f/u appt with his cardiologist before we can proceed with scheduling.

## 2024-07-12 ENCOUNTER — Ambulatory Visit

## 2024-07-12 ENCOUNTER — Encounter (INDEPENDENT_AMBULATORY_CARE_PROVIDER_SITE_OTHER): Payer: Self-pay

## 2024-07-12 ENCOUNTER — Telehealth: Payer: Self-pay | Admitting: *Deleted

## 2024-07-12 ENCOUNTER — Encounter (INDEPENDENT_AMBULATORY_CARE_PROVIDER_SITE_OTHER): Admitting: Ophthalmology

## 2024-07-12 ENCOUNTER — Encounter: Payer: Self-pay | Admitting: *Deleted

## 2024-07-13 ENCOUNTER — Ambulatory Visit (HOSPITAL_COMMUNITY)
Admission: RE | Admit: 2024-07-13 | Discharge: 2024-07-13 | Disposition: A | Discharge status: Home / Self Care | Source: Ambulatory Visit | Attending: Family Medicine | Admitting: Family Medicine

## 2024-07-13 ENCOUNTER — Other Ambulatory Visit: Admitting: *Deleted

## 2024-07-13 DIAGNOSIS — R1310 Dysphagia, unspecified: Secondary | ICD-10-CM | POA: Diagnosis not present

## 2024-07-13 NOTE — Transitions of Care (Post Inpatient/ED Visit) (Signed)
 Transition of Care week 2/ day # 7  Visit Note  07/13/2024  Name: Marvin Foster MRN: [id number]          DOB: 1934-04-26  Situation: Patient enrolled in [facility] 30-day program. Visit completed with patient's son- caregiver Marvin Foster- verified on [facility] DPR by telephone.   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; pneumonia Independent in self- care at baseline; resides alone at baseline- supportive family assists as indicated- manages medications- uses walker at baseline; patient currently residing with son Marvin Foster post- recent hospital discharge 0 unplanned hospital admissions x last (6)/ (12) months  Initial Transition Care Management Follow-up Telephone Call    Past Medical History:  Diagnosis Date   Acute respiratory failure due to COVID-19 [facility]) 07/07/2020   ANEMIA 02/18/2010   Qualifier: Diagnosis of  By: Mahlon MD, Comer     Arthritis 12/2005   left hand   Arthritis    Left knee   Atrial fibrillation (HCC) 07/24/2013   Blood transfusion without reported diagnosis    Bursitis of knee    right knee   CAD (coronary artery disease)    Cardiac arrhythmia due to congenital heart disease    Chronic anticoagulation 06/12/2015   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  Verlin and Dr. Shyrl   Scrotal mass 03/07/2012   Spinal stenosis 01/20/2005   Tubular adenoma of colon    Assessment: Per caregiver/ son:  I am sorry I couldn't talk yesterday.  He seems to be doing fine; he is getting stronger with each passing day.  He went to a swallow study today, no results yet.  Things seem to be coming right along, I have no concerns    Son denies clinical concerns and reports patient in no distress- unfortunately, there were connectivity issues throughout today's brief call and the call eventually dropped altogether:  son did not answer subsequent/ second outreach attempt- left voice mail that I would re-try call for week # 3 next week  Patient Reported Symptoms: Cognitive Cognitive Status: Normal speech and language skills, Alert and oriented to person, place, and time, Insightful and able to interpret abstract concepts (per caregiver/ son)      Neurological Neurological Review of Symptoms: No symptoms reported (per caregiver/ son)    HEENT HEENT Symptoms Reported:  (per caregiver/ son)      Cardiovascular Cardiovascular Symptoms Reported: No symptoms reported (per caregiver/ son)    Respiratory Respiratory Symptoms Reported: No symptoms reported (per caregiver/ son) Other Respiratory Symptoms: He is doing fine, no issues with breathing    Endocrine Endocrine Symptoms Reported: No symptoms reported (per caregiver/ son: call dropped and son did not answer second outreach attempt-  unable to assess)    Gastrointestinal Gastrointestinal Symptoms Reported: No symptoms reported (per caregiver/ son: call dropped and son did not answer second outreach attempt- unable to assess)      Genitourinary Genitourinary Symptoms Reported: No symptoms reported (per caregiver/ son: call dropped and son did not answer second outreach attempt- unable to assess)    Integumentary Integumentary Symptoms Reported: No symptoms reported (per caregiver/ son: call dropped and son  did not answer second outreach attempt- unable to assess)    Musculoskeletal Musculoskelatal Symptoms Reviewed: No symptoms reported (per caregiver/ son) Additional Musculoskeletal Details: He seems to be getting stronger all the time Musculoskeletal Management Strategies: Coping strategies, Medical device, Routine screening      Psychosocial Psychosocial Symptoms Reported: No symptoms reported (per caregiver/ son: call dropped and son did not answer second outreach attempt- unable to assess)         There were no vitals filed for this visit.  Medications Reviewed Today     Reviewed by [place] Souter M, RN (Registered Nurse) on 07/13/24 at 1454  Med List Status: <None>   Medication Order Taking? Sig Documenting Provider Last Dose Status Informant  allopurinol  (ZYLOPRIM ) 100 MG tablet [id number]  Take 1 tablet (100 mg total) by mouth daily. Tabori, Katherine E, MD  Active Self, Pharmacy Records  amLODipine  (NORVASC ) 2.5 MG tablet [id number]  Take 1 tablet by mouth once daily Jeffrie Oneil BROCKS, MD  Active Self, Pharmacy Records  atorvastatin  (LIPITOR) 40 MG tablet [id number]  TAKE 1 TABLET BY MOUTH AT BEDTIME Tabori, Katherine E, MD  Active Self, Pharmacy Records  azithromycin  (ZITHROMAX ) 500 MG tablet [id number]  Take 1 tablet (500 mg total) by mouth as directed. Take one tablet 1 hour before any dental work including cleanings. Sebastian Lamarr SAUNDERS, PA-C  Active   BESIVANCE 0.6 % SUSP [id number]  Place 1 drop into the left eye See admin instructions. Administer 1 drop into the left eye four times daily for 2 days. Start the day before eye injection and continue for 1 day following. [provider]  Active Self, Pharmacy Records  CALCIUM  PO [id number]  Take 1 tablet by mouth daily. [provider]  Active Self, Pharmacy Records  cefUROXime  (CEFTIN ) 500 MG tablet [id number]  Take 1 tablet (500 mg total) by mouth 2 (two) times daily with a meal. Sebastian Lamarr SAUNDERS, PA-C  Active    cetirizine  (ZYRTEC ) 10 MG tablet [id number]  Take 1 tablet by mouth once daily  Patient taking differently: Take 10 mg by mouth daily. As needed   Tabori, Katherine E, MD  Active Self, Pharmacy Records  diclofenac  Sodium (VOLTAREN ) 1 % GEL [id number]  Apply 2 g topically 4 (four) times daily. [name] BRAVO, MD  Active Self, Pharmacy Records  Ferrous Sulfate (IRON PO) [id number]  Take 1 tablet by mouth daily. [provider]  Active Self, Pharmacy Records  furosemide  (LASIX ) 40 MG tablet [id number]  Take 1 tablet by mouth once daily Tabori, Katherine E, MD  Active Self, Pharmacy Records  glucose blood [facility] VERIO) test strip [id number]  USE 1 STRIP TO CHECK GLUCOSE TWICE DAILY AS DIRECTED Tabori, Katherine E, MD  Active   Lancets [facility] ULTRASOFT) lancets [id number]  Use as instructed Tabori, Katherine E, MD  Active Self, Pharmacy Records  latanoprost  (XALATAN ) 0.005 % ophthalmic solution [id number]  Place 1 drop into both eyes at bedtime. [provider]  Active Self, Pharmacy Records  levothyroxine  (SYNTHROID ) 75 MCG tablet [id number]  Take 1 tablet by  mouth once daily Tabori, Katherine E, MD  Active Self, Pharmacy Records  metFORMIN  (GLUCOPHAGE ) 1000 MG tablet [id number]  Take 1 tablet by mouth twice daily Tabori, Katherine E, MD  Active Self, Pharmacy Records  metoprolol  tartrate (LOPRESSOR ) 100 MG tablet [id number]  Take 1 tablet by mouth twice daily Tabori, Katherine E, MD  Active Self, Pharmacy Records  Multiple Vitamins-Minerals (MULTIVITAMIN MEN 50+) TABS [id number]  Take 1 tablet by mouth daily. [provider]  Active Self, Pharmacy Records  Multiple Vitamins-Minerals (ZINC PO) [id number]  Take 1 tablet by mouth daily. [provider]  Active Self, Pharmacy Records  Omega-3 Fatty Acids (FISH OIL PO) [id number]  Take 2 capsules by mouth daily.  Patient taking differently: Take 2 capsules by mouth daily. 07/06/24: Reports during TOC call- currently on hold  until patient has scheduled swallow test   [provider]  Active Self, Pharmacy Records  tamsulosin  (FLOMAX ) 0.4 MG CAPS capsule [id number]  Take 1 capsule by mouth once daily Tabori, Katherine E, MD  Active Self, Pharmacy Records  timolol  (TIMOPTIC ) 0.5 % ophthalmic solution [id number]  Place 1 drop into both eyes daily.  [provider]  Active Self, Pharmacy Records  tolterodine  (DETROL ) 1 MG tablet [id number]  Take 1 tablet (1 mg total) by mouth 2 (two) times daily. Tabori, Katherine E, MD  Active Self, Pharmacy Records  warfarin (COUMADIN ) 2.5 MG tablet [id number]  TAKE 1/2 TO 1 (ONE-HALF TO ONE) TABLET BY MOUTH ONCE DAILY OR  AS  DIRECTED  BY  ANTICOAGULATION  CLINIC  Patient taking differently: Take 1.25-2.5 mg by mouth See admin instructions. 07/06/24: Verified during TOC call-- coumadin  currently on hold after recent TAVR until 07/10/24  Take 1/2 tablet (1.25mg ) by mouth on Saturday and take 1 tablet (2.5mg ) on all other days.   Jeffrie Oneil BROCKS, MD  Active Self, Pharmacy Records           Recommendation:   Continue Current Plan of Care  Follow Up Plan:   Telephone follow-up in 1 week  Pls call/ message for questions,  Ravon Mcilhenny Mckinney Kendarius Vigen, RN, BSN, CCRN Alumnus RN Care Manager  Transitions of Care  VBCI - [facility] [phone]: direct office

## 2024-07-13 NOTE — Patient Instructions (Signed)
 Visit Information  Thank you for taking time to visit with me today. I am sorry our call dropped today- I was uable to contact you successfully when I tried to call back; I will try to call you back to check in again next week  Following are the goals we discussed today:  Patient Self Care Activities:  Attend all scheduled provider appointments Call provider office for new concerns or questions  Participate in Transition of Care Program/Attend TOC scheduled calls Take medications as prescribed   Continue pacing activity as your recuperation from recent surgery continues Continue monitoring and recording your blood sugars at home Use assistive devices as needed to prevent falls If you believe your condition is getting worse- contact your care providers (doctors) promptly- reaching out to your doctor early when you have concerns can prevent you from having to go to the hospital  If you are experiencing a Mental Health or Behavioral Health Crisis or need someone to talk to, please  call the Suicide and Crisis Lifeline: 988 call the [place]  National Suicide Prevention Lifeline: 337-874-4933 or TTY: 937-731-9959 TTY 4500899509) to talk to a trained counselor call 1-800-273-TALK (toll free, 24 hour hotline) go to [facility] [address], [place] [phone]) call the [facility] Crisis Line: [phone] call 911   Patient verbalizes understanding of instructions and care plan provided today and agrees to view in MyChart. Active MyChart status and patient understanding of how to access instructions and care plan via MyChart confirmed with patient.

## 2024-07-16 ENCOUNTER — Ambulatory Visit: Payer: Self-pay | Admitting: Family Medicine

## 2024-07-16 ENCOUNTER — Encounter (INDEPENDENT_AMBULATORY_CARE_PROVIDER_SITE_OTHER): Admitting: Ophthalmology

## 2024-07-16 DIAGNOSIS — H353221 Exudative age-related macular degeneration, left eye, with active choroidal neovascularization: Secondary | ICD-10-CM

## 2024-07-16 DIAGNOSIS — I10 Essential (primary) hypertension: Secondary | ICD-10-CM | POA: Diagnosis not present

## 2024-07-16 DIAGNOSIS — H43813 Vitreous degeneration, bilateral: Secondary | ICD-10-CM

## 2024-07-16 DIAGNOSIS — H35033 Hypertensive retinopathy, bilateral: Secondary | ICD-10-CM | POA: Diagnosis not present

## 2024-07-16 DIAGNOSIS — R1310 Dysphagia, unspecified: Secondary | ICD-10-CM

## 2024-07-16 DIAGNOSIS — H353112 Nonexudative age-related macular degeneration, right eye, intermediate dry stage: Secondary | ICD-10-CM | POA: Diagnosis not present

## 2024-07-16 DIAGNOSIS — K224 Dyskinesia of esophagus: Secondary | ICD-10-CM

## 2024-07-16 NOTE — Progress Notes (Signed)
 Tried to call patient and was unable to leave vm to return call

## 2024-07-17 ENCOUNTER — Other Ambulatory Visit: Payer: Self-pay | Admitting: *Deleted

## 2024-07-17 NOTE — Transitions of Care (Post Inpatient/ED Visit) (Signed)
 Transition of Care week 3/ day # 11  Visit Note  07/17/2024  Name: Marvin Foster MRN: [id number]          DOB: 10/29/1933  Situation: Patient enrolled in [facility] 30-day program. Visit completed with patient's son-caregiver [name]- verified on [facility] DPR by telephone.   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; pneumonia Independent in self- care at baseline; resides alone at baseline- supportive family assists as indicated- manages medications- uses walker at baseline; patient currently residing with son [name] post- recent hospital discharge 0 unplanned hospital admissions x last (6)/ (12) months  Initial Transition Care Management Follow-up Telephone Call    Past Medical History:  Diagnosis Date   Acute respiratory failure due to COVID-19 [facility]) 07/07/2020   ANEMIA 02/18/2010   Qualifier: Diagnosis of  By: Mahlon MD, Comer     Arthritis 12/2005   left hand   Arthritis    Left knee   Atrial fibrillation (HCC) 07/24/2013   Blood transfusion without reported diagnosis    Bursitis of knee    right knee   CAD (coronary artery disease)    Cardiac arrhythmia due to congenital heart disease    Chronic anticoagulation 06/12/2015   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  Verlin and Dr. Shyrl   Scrotal mass 03/07/2012   Spinal stenosis 01/20/2005   Tubular adenoma of colon    Assessment:  He is definitely getting stronger with each passing day- he gets tired after office visits, but he seems stronger and I am having to help him less and less.  Still staying with me for now, we'll ask the doctors when he is safe to go back home.  Blood sugars look okay and the incision looks fine- just needs that old dressing taken off and they will do that tomorrow.  He is  no longer having to use the walker inside the house, but is still using it when we leave the house.  I don't have any concerns around his condition- he is getting better all the time    Son denies clinical concerns and reports patient in no distress  Patient Reported Symptoms: Cognitive Cognitive Status: Normal speech and language skills, Insightful and able to interpret abstract concepts, Alert and oriented to person, place, and time (per son/ caregiver [name]) Cognitive/Intellectual Conditions Management [RPT]: None reported or documented in medical history or problem list      Neurological Neurological Review of Symptoms: No symptoms reported (per son/ caregiver [name])    HEENT HEENT Symptoms Reported: No symptoms reported (per son/ caregiver [name])      Cardiovascular Cardiovascular Symptoms Reported: No symptoms reported, Swelling in legs or feet (per son/  caregiver [name]) Other Cardiovascular Symptoms: No changes in his swelling in his feet: that is always there and they have said always will be; it is no worse or no better than it has been for years Caregiver confirms currently not checking daily weights at home: no one has ever told us  before to weigh him on a regular basis  Provided education re: rationale for daily weight monitoring at home along with weight gain guidelines/ action plan for weight gain- he will consider starting this and acknowledges that patient is a fall risk; confirmed  taking diuretic as prescribed Does patient have uncontrolled Hypertension?: No Cardiovascular Management Strategies: Adequate rest, Medication therapy, Coping strategies, Routine screening  Respiratory Respiratory Symptoms Reported: No symptoms reported (per son/ caregiver [name]) Other Respiratory Symptoms: No issues with his breathing, he is not short of breath Respiratory Management Strategies: Routine screening, Coping strategies, Adequate rest  Endocrine Endocrine Symptoms Reported: No symptoms reported (per son/ caregiver [name]) Is patient diabetic?: Yes Is patient checking blood sugars at home?: Yes List most recent blood sugar readings, include date and time of day: Sugars running between 110-140 in the morning; yesterday it was 113 before eating; today it was 139 Reviewed most recent A1C value from 01/18/24: 6.8; reviewed historical trends over time with caregiver who verbalizes good understanding of same along with significance of A1-C and general A1-C goals    Gastrointestinal Gastrointestinal Symptoms Reported: No symptoms reported (per caregiver- son [name]) Additional Gastrointestinal Details: Eating great, no problem with appetite; pooping normally, every day without faily Gastrointestinal Management Strategies: Coping strategies    Genitourinary Genitourinary Symptoms Reported: No symptoms reported (per caregiver- son [name]) Additional Genitourinary Details: Urine is good amount and is clear yellow    Integumentary Integumentary Symptoms Reported: Incision (per son- caregiver [name]) Additional Integumentary Details: Incision looks good without any signs of infection- signs/ symptoms reviewed with caregiver; confirmed plans to attend cardiology wound check scheduled office visit tomorrow 07/18/24 Skin Management Strategies: Coping strategies, Routine screening  Musculoskeletal Musculoskelatal Symptoms Reviewed: Difficulty walking, Limited mobility Additional  Musculoskeletal Details: confirmed currently requiring/ using assistive devices for ambulation - walker when we leave the house; he is not having to use it anymore inside the house Caregiver confirms patient is becoming stronger and requires only occasional assistance from others Musculoskeletal Management Strategies: Medical device, Coping strategies, Routine screening Falls in the past year?: Yes Number of falls in past year: 2 or more Was there an injury with Fall?: No Fall Risk Category Calculator: 2 Patient Fall Risk Level: Moderate Fall Risk Patient at Risk for Falls Due to: History of fall(s), Impaired mobility Fall risk Follow up: Falls prevention discussed, Education provided  Psychosocial Psychosocial Symptoms Reported: No symptoms reported (per caregiver- son [name]) Additional Psychological Details: Unable to assess with patient; Entirety of TOC call completed with caregiver- son [name] [place] Management Strategies: Support system Major Change/Loss/Stressor/Fears (CP): Denies Techniques to Cope with Loss/Stress/Change: Diversional activities Quality of Family Relationships: supportive, helpful, involved   There were no vitals filed for this visit.  Medications Reviewed Today     Reviewed by Caliph Borowiak M, RN (Registered Nurse) on 07/17/24 at [phone]  Med List Status: <None>   Medication Order Taking? Sig Documenting Provider Last Dose Status Informant  allopurinol  (ZYLOPRIM ) 100 MG tablet [id number]  Take 1 tablet (100 mg total) by mouth daily. Tabori, Katherine E, MD  Active Self, Pharmacy Records  amLODipine  (NORVASC ) 2.5 MG tablet [id number]  Take 1 tablet by mouth once daily Skains, Oneil BROCKS,  MD  Active Self, Pharmacy Records  atorvastatin  (LIPITOR) 40 MG tablet [id number]  TAKE 1 TABLET BY MOUTH AT BEDTIME Tabori, Katherine E, MD  Active Self, Pharmacy Records  azithromycin  (ZITHROMAX ) 500 MG tablet [id number]  Take 1 tablet (500 mg total) by mouth as directed. Take one tablet  1 hour before any dental work including cleanings. Sebastian Lamarr SAUNDERS, PA-C  Active   BESIVANCE 0.6 % SUSP [id number]  Place 1 drop into the left eye See admin instructions. Administer 1 drop into the left eye four times daily for 2 days. Start the day before eye injection and continue for 1 day following. [provider]  Active Self, Pharmacy Records  CALCIUM  PO [id number]  Take 1 tablet by mouth daily. [provider]  Active Self, Pharmacy Records  cefUROXime  (CEFTIN ) 500 MG tablet [id number]  Take 1 tablet (500 mg total) by mouth 2 (two) times daily with a meal. Sebastian Lamarr SAUNDERS, PA-C  Active   cetirizine  (ZYRTEC ) 10 MG tablet [id number]  Take 1 tablet by mouth once daily  Patient taking differently: Take 10 mg by mouth daily. As needed   Tabori, Katherine E, MD  Active Self, Pharmacy Records  diclofenac  Sodium (VOLTAREN ) 1 % GEL [id number]  Apply 2 g topically 4 (four) times daily. [name] BRAVO, MD  Active Self, Pharmacy Records  Ferrous Sulfate (IRON PO) [id number]  Take 1 tablet by mouth daily. [provider]  Active Self, Pharmacy Records  furosemide  (LASIX ) 40 MG tablet [id number]  Take 1 tablet by mouth once daily Tabori, Katherine E, MD  Active Self, Pharmacy Records  glucose blood [facility] VERIO) test strip [id number]  USE 1 STRIP TO CHECK GLUCOSE TWICE DAILY AS DIRECTED Tabori, Katherine E, MD  Active   Lancets [facility] ULTRASOFT) lancets [id number]  Use as instructed Tabori, Katherine E, MD  Active Self, Pharmacy Records  latanoprost  (XALATAN ) 0.005 % ophthalmic solution [id number]  Place 1 drop into both eyes at bedtime. [provider]  Active Self, Pharmacy Records  levothyroxine  (SYNTHROID ) 75 MCG tablet [id number]  Take 1 tablet by mouth once daily Tabori, Katherine E, MD  Active Self, Pharmacy Records  metFORMIN  (GLUCOPHAGE ) 1000 MG tablet [id number]  Take 1 tablet by mouth twice daily Tabori, Katherine E, MD  Active Self, Pharmacy Records   metoprolol  tartrate (LOPRESSOR ) 100 MG tablet [id number]  Take 1 tablet by mouth twice daily Tabori, Katherine E, MD  Active Self, Pharmacy Records  Multiple Vitamins-Minerals (MULTIVITAMIN MEN 50+) TABS [id number]  Take 1 tablet by mouth daily. [provider]  Active Self, Pharmacy Records  Multiple Vitamins-Minerals (ZINC PO) [id number]  Take 1 tablet by mouth daily. [provider]  Active Self, Pharmacy Records  Omega-3 Fatty Acids (FISH OIL PO) [id number]  Take 2 capsules by mouth daily.  Patient taking differently: Take 2 capsules by mouth daily. 07/06/24: Reports during TOC call- currently on hold until patient has scheduled swallow test   [provider]  Active Self, Pharmacy Records  tamsulosin  (FLOMAX ) 0.4 MG CAPS capsule [id number]  Take 1 capsule by mouth once daily Tabori, Katherine E, MD  Active Self, Pharmacy Records  timolol  (TIMOPTIC ) 0.5 % ophthalmic solution [id number]  Place 1 drop into both eyes daily.  [provider]  Active Self, Pharmacy Records  tolterodine  (DETROL ) 1 MG tablet [id number]  Take 1 tablet (1 mg total) by mouth 2 (two) times daily. Tabori, Katherine E, MD  Active Self, Pharmacy Records  warfarin (COUMADIN ) 2.5 MG tablet [id number]  TAKE  1/2 TO 1 (ONE-HALF TO ONE) TABLET BY MOUTH ONCE DAILY OR  AS  DIRECTED  BY  ANTICOAGULATION  CLINIC  Patient taking differently: Take 1.25-2.5 mg by mouth See admin instructions. 07/06/24: Verified during TOC call-- coumadin  currently on hold after recent TAVR until 07/10/24  Take 1/2 tablet (1.25mg ) by mouth on Saturday and take 1 tablet (2.5mg ) on all other days.   Jeffrie Oneil BROCKS, MD  Active Self, Pharmacy Records           Recommendation:   PCP Follow-up- as scheduled 07/25/24 Specialty provider follow-up- as scheduled 07/18/24 Continue current plan of care  Follow Up Plan:   Telephone follow-up in 1 week- as scheduled 07/26/24  Pls call/ message for questions,  Beatris Blinda Lawrence, RN, BSN, CCRN Alumnus RN Care Manager  Transitions of Care  [facility] - [facility] [phone]: direct office

## 2024-07-17 NOTE — Patient Instructions (Signed)
 Visit Information  Thank you for taking time to visit with me today. Please don't hesitate to contact me if I can be of assistance to you before our next scheduled telephone appointment.  Our next appointment is by telephone on Thursday July 26, 2024 at 10:00 am  Please call the care guide team at [phone] if you need to cancel or reschedule your appointment.   Following are the goals we discussed today:  Patient Self Care Activities:  Attend all scheduled provider appointments Call provider office for new concerns or questions  Participate in Transition of Care Program/Attend TOC scheduled calls Take medications as prescribed   Continue pacing activity as your recuperation from recent surgery continues Continue monitoring and recording your blood sugars at home Use assistive devices as needed to prevent falls- your walker If you believe your condition is getting worse- contact your care providers (doctors) promptly- reaching out to your doctor early when you have concerns can prevent you from having to go to the hospital  If you are experiencing a Mental Health or Behavioral Health Crisis or need someone to talk to, please  call the Suicide and Crisis Lifeline: 988 call the [place]  National Suicide Prevention Lifeline: 617-842-6015 or TTY: (772)610-5051 TTY (629)043-2165) to talk to a trained counselor call 1-800-273-TALK (toll free, 24 hour hotline) go to [facility] Care [address], [place] [phone]) call the [facility] Crisis Line: [phone] call 911   Patient's son- caregiver verbalizes understanding of instructions and care plan provided today and agrees to view in MyChart. Active MyChart status and patient understanding of how to access instructions and care plan via MyChart confirmed with patient.     Haddy Mullinax Mckinney Yeira Gulden, RN, BSN, Media planner  Transitions of Care  [facility]BCI - [facility]  Health [phone]: direct office

## 2024-07-17 NOTE — Progress Notes (Signed)
 Patient read my chart message

## 2024-07-18 ENCOUNTER — Ambulatory Visit: Attending: Cardiology | Admitting: Pharmacist

## 2024-07-18 ENCOUNTER — Ambulatory Visit: Attending: Cardiovascular Disease

## 2024-07-18 DIAGNOSIS — I442 Atrioventricular block, complete: Secondary | ICD-10-CM

## 2024-07-18 DIAGNOSIS — I4821 Permanent atrial fibrillation: Secondary | ICD-10-CM

## 2024-07-18 DIAGNOSIS — I4891 Unspecified atrial fibrillation: Secondary | ICD-10-CM | POA: Diagnosis not present

## 2024-07-18 DIAGNOSIS — Z5181 Encounter for therapeutic drug level monitoring: Secondary | ICD-10-CM | POA: Diagnosis not present

## 2024-07-18 LAB — POCT INR: INR: 1.8 — AB (ref 2.0–3.0)

## 2024-07-18 NOTE — Patient Instructions (Signed)

## 2024-07-18 NOTE — Progress Notes (Signed)
 Description   *DOES NOT NEED A PRINTOUT* INR-1.8; Today take 2 tablets (5mg ) and then continue continue taking 1 tablet daily except for 1/2 tablet on Saturdays.   Stay consistent with greens each week (3 per week).   Call us  with any medication changes or concerns # [phone] Coumadin  Clinic.   Allopurinol  Daily started 1/3

## 2024-07-18 NOTE — Patient Instructions (Signed)
 Description   *DOES NOT NEED A PRINTOUT* INR-1.8; Today take 2 tablets (5mg ) and then continue continue taking 1 tablet daily except for 1/2 tablet on Saturdays.   Stay consistent with greens each week (3 per week).   Call us  with any medication changes or concerns # [phone] Coumadin  Clinic.   Allopurinol  Daily started 1/3

## 2024-07-18 NOTE — Progress Notes (Signed)
 Normal single chamber pacemaker wound check. Presenting rhythm: VP 60 . Wound well healed. Routine testing performed. Thresholds, sensing, and impedance consistent with implant measurements and at 3.5V safety margin/auto capture until 3 month visit. No episodes. Reviewed arm restrictions to continue for 6 weeks total post op.  Pt enrolled in remote follow-up.

## 2024-07-23 ENCOUNTER — Other Ambulatory Visit: Payer: Self-pay | Admitting: Family Medicine

## 2024-07-23 DIAGNOSIS — E1169 Type 2 diabetes mellitus with other specified complication: Secondary | ICD-10-CM

## 2024-07-25 ENCOUNTER — Encounter: Payer: Self-pay | Admitting: Family Medicine

## 2024-07-25 ENCOUNTER — Ambulatory Visit: Admitting: Family Medicine

## 2024-07-25 VITALS — BP 132/74 | HR 60 | Temp 99.3°F | Ht 68.0 in | Wt 208.1 lb

## 2024-07-25 DIAGNOSIS — Z23 Encounter for immunization: Secondary | ICD-10-CM

## 2024-07-25 DIAGNOSIS — Z0000 Encounter for general adult medical examination without abnormal findings: Secondary | ICD-10-CM | POA: Diagnosis not present

## 2024-07-25 DIAGNOSIS — E119 Type 2 diabetes mellitus without complications: Secondary | ICD-10-CM | POA: Diagnosis not present

## 2024-07-25 LAB — CBC WITH DIFFERENTIAL/PLATELET
Basophils Absolute: 0.1 K/uL (ref 0.0–0.1)
Basophils Relative: 2.1 % (ref 0.0–3.0)
Eosinophils Absolute: 0.5 K/uL (ref 0.0–0.7)
Eosinophils Relative: 8.2 % — ABNORMAL HIGH (ref 0.0–5.0)
HCT: 33.6 % — ABNORMAL LOW (ref 39.0–52.0)
Hemoglobin: 11.1 g/dL — ABNORMAL LOW (ref 13.0–17.0)
Lymphocytes Relative: 29.1 % (ref 12.0–46.0)
Lymphs Abs: 1.8 K/uL (ref 0.7–4.0)
MCHC: 33 g/dL (ref 30.0–36.0)
MCV: 94.9 fl (ref 78.0–100.0)
Monocytes Absolute: 0.6 K/uL (ref 0.1–1.0)
Monocytes Relative: 10.2 % (ref 3.0–12.0)
Neutro Abs: 3.2 K/uL (ref 1.4–7.7)
Neutrophils Relative %: 50.4 % (ref 43.0–77.0)
Platelets: 186 K/uL (ref 150.0–400.0)
RBC: 3.54 Mil/uL — ABNORMAL LOW (ref 4.22–5.81)
RDW: 15.9 % — ABNORMAL HIGH (ref 11.5–15.5)
WBC: 6.3 K/uL (ref 4.0–10.5)

## 2024-07-25 LAB — BASIC METABOLIC PANEL WITH GFR
BUN: 23 mg/dL (ref 6–23)
CO2: 30 meq/L (ref 19–32)
Calcium: 8.8 mg/dL (ref 8.4–10.5)
Chloride: 100 meq/L (ref 96–112)
Creatinine, Ser: 0.99 mg/dL (ref 0.40–1.50)
GFR: 67.95 mL/min (ref >60.00)
Glucose, Bld: 142 mg/dL — ABNORMAL HIGH (ref 70–99)
Potassium: 4.2 meq/L (ref 3.5–5.1)
Sodium: 139 meq/L (ref 135–145)

## 2024-07-25 LAB — HEPATIC FUNCTION PANEL
ALT: 19 U/L (ref 0–53)
AST: 24 U/L (ref 0–37)
Albumin: 4 g/dL (ref 3.5–5.2)
Alkaline Phosphatase: 78 U/L (ref 39–117)
Bilirubin, Direct: 0.2 mg/dL (ref 0.0–0.3)
Total Bilirubin: 0.7 mg/dL (ref 0.2–1.2)
Total Protein: 6.6 g/dL (ref 6.0–8.3)

## 2024-07-25 LAB — LIPID PANEL
Cholesterol: 119 mg/dL (ref 0–200)
HDL: 35.6 mg/dL — ABNORMAL LOW (ref >39.00)
LDL Cholesterol: 58 mg/dL (ref 0–99)
NonHDL: 83.47
Total CHOL/HDL Ratio: 3
Triglycerides: 126 mg/dL (ref 0.0–149.0)
VLDL: 25.2 mg/dL (ref 0.0–40.0)

## 2024-07-25 LAB — HEMOGLOBIN A1C: Hgb A1c MFr Bld: 6.8 % — ABNORMAL HIGH (ref 4.6–6.5)

## 2024-07-25 LAB — TSH: TSH: 3.85 u[IU]/mL (ref 0.35–5.50)

## 2024-07-25 NOTE — Progress Notes (Signed)
   Subjective:    Patient ID: Marvin Foster, male    DOB: [birth year], 89 y.o.   MRN: [id number]  HPI CPE- UTD on Tdap, eye exam, foot exam.  Due for flu  Patient Care Team    Relationship Specialty Notifications Start End  [name] BRAVO, MD PCP - General   09/23/10   Jeffrie Oneil BROCKS, MD PCP - Cardiology Cardiology Admissions 11/01/18   [name], MD PCP - Structural Heart Cardiology  07/03/24   Cindie Ole DASEN, MD PCP - Electrophysiology Cardiology  07/04/24   Jeffrie Oneil BROCKS, MD Consulting Physician Cardiology  05/09/14   [facility]    01/26/17   [facility]    01/26/17   [facility]  Specialist  01/26/17   Loreda Hacker, DPM Consulting Physician Podiatry  02/24/18   Dow Maxwell, PT Physical Therapist Physical Therapy  07/20/18   Nicholaus Sherlean CROME, [facility] (Inactive)  Pharmacist  09/09/21    Comment: [phone]  Abelino Beatris HERO, RN VBCI Care Management   07/06/24     Health Maintenance  Topic Date Due   Influenza Vaccine  05/11/2024   HEMOGLOBIN A1C  07/19/2024   DTaP/Tdap/Td (2 - Td or Tdap) 08/28/2024   OPHTHALMOLOGY EXAM  05/18/2025   Medicare Annual Wellness (AWV)  06/13/2025   FOOT EXAM  07/25/2025   Pneumococcal Vaccine: 50+ Years  Completed   Zoster Vaccines- Shingrix  Completed   Meningococcal B Vaccine  Aged Out   COVID-19 Vaccine  Discontinued      Review of Systems Patient reports no vision/hearing changes, anorexia, fever ,adenopathy, persistant/recurrent hoarseness, chest pain, palpitations, edema, persistant/recurrent cough, hemoptysis, dyspnea (rest,exertional, paroxysmal nocturnal), gastrointestinal  bleeding (melena, rectal bleeding), abdominal pain, excessive heart burn, GU symptoms (dysuria, hematuria, voiding/incontinence issues) syncope, focal weakness, memory loss, numbness & tingling, skin/hair/nail changes, depression, anxiety, abnormal bruising/bleeding.  + knee pain after fall + trouble swallowing    Objective:    Physical Exam General Appearance:    Alert, cooperative, no distress, appears stated age  Head:    Normocephalic, without obvious abnormality, atraumatic  Eyes:    PERRL, conjunctiva/corneas clear, EOM's intact both eyes       Ears:    Normal TM's and external ear canals, both ears  Nose:   Nares normal, septum midline, mucosa normal, no drainage   or sinus tenderness  Throat:   Lips, mucosa, and tongue normal; teeth and gums normal  Neck:   Supple, symmetrical, trachea midline, no adenopathy;       thyroid :  No enlargement/tenderness/nodules  Back:     Symmetric, no curvature, ROM normal, no CVA tenderness  Lungs:     Clear to auscultation bilaterally, respirations unlabored  Chest wall:    No tenderness or deformity  Heart:    Irregular S1 and S2 normal, no murmur, rub or gallop  Abdomen:     Soft, non-tender, bowel sounds active all four quadrants,    no masses, no organomegaly  Genitalia:    deferred  Rectal:    Extremities:   Extremities normal, atraumatic, no cyanosis or edema  Pulses:   2+ and symmetric all extremities  Skin:   Skin color, texture, turgor normal, no rashes or lesions  Lymph nodes:   Cervical, supraclavicular, and axillary nodes normal  Neurologic:   CNII-XII intact. Normal strength, sensation and reflexes      throughout          Assessment & Plan:

## 2024-07-25 NOTE — Assessment & Plan Note (Signed)
 Pt's PE unchanged from previous and WNL w/ exception of BMI and Afib.  UTD on Tdap, eye exam, foot exam.  Flu shot given today.  Check labs.  Anticipatory guidance provided.

## 2024-07-25 NOTE — Patient Instructions (Signed)
 Follow up in 6 months to recheck sugar, cholesterol, and BP We'll notify you of your lab results and make any changes if needed Keep up the good work!  You look great! Call with any questions or concerns Stay Safe!  Stay Healthy! Happy Fall!!!

## 2024-07-26 ENCOUNTER — Other Ambulatory Visit: Payer: Self-pay | Admitting: Family Medicine

## 2024-07-26 ENCOUNTER — Other Ambulatory Visit: Payer: Self-pay | Admitting: Cardiology

## 2024-07-26 ENCOUNTER — Other Ambulatory Visit: Payer: Self-pay | Admitting: *Deleted

## 2024-07-26 ENCOUNTER — Ambulatory Visit: Payer: Self-pay | Admitting: Family Medicine

## 2024-07-26 DIAGNOSIS — E1169 Type 2 diabetes mellitus with other specified complication: Secondary | ICD-10-CM

## 2024-07-26 NOTE — Progress Notes (Signed)
 Pt has reviewed via MyChart

## 2024-07-26 NOTE — Patient Instructions (Signed)
 Visit Information  Thank you for taking time to visit with me today. Please don't hesitate to contact me if I can be of assistance to you before our next scheduled telephone appointment.  It has been a pleasure working with you after your recent hospital visit!  Great job managing your care after your hospital visit!  I am glad that you are doing well!  Please do not hesitate to contact me in the future if I can be of assistance to you!  Following are the goals we discussed today:  Patient Self Care Activities:  Attend all scheduled provider appointments Call provider office for new concerns or questions  Take medications as prescribed   Continue pacing activity as your recuperation from recent surgery continues Continue monitoring and recording your blood sugars at home Use assistive devices as needed to prevent falls- your walker If you believe your condition is getting worse- contact your care providers (doctors) promptly- reaching out to your doctor early when you have concerns can prevent you from having to go to the hospital  If you are experiencing a Mental Health or Behavioral Health Crisis or need someone to talk to, please call the Suicide and Crisis Lifeline: 988 call the [place]  National Suicide Prevention Lifeline: 6053131594 or TTY: 316-673-7469 TTY 236-405-9097) to talk to a trained counselor call 1-800-273-TALK (toll free, 24 hour hotline) go to [facility] Urgent Care [address], [place] [phone]) call the [facility] Crisis Line: [phone] call 911   Patient's caregiver verbalizes understanding of instructions and care plan provided today and agrees to view in MyChart. Active MyChart status and patient understanding of how to access instructions and care plan via MyChart confirmed with patient.     Alvita Fana Mckinney Arriana Lohmann, RN, BSN, Media planner  Transitions of Care  [facility] - [facility] [phone]: direct office

## 2024-07-26 NOTE — Transitions of Care (Post Inpatient/ED Visit) (Signed)
 Transition of Care week 4/ day # 20 TOC 30-day program case closure: caregiver declines more calls  Visit Note  07/26/2024  Name: Marvin Foster MRN: [id number]          DOB: 12-17-1933  Situation: Patient enrolled in [facility] 30-day program. Visit completed with caregiver- son [name]- verified on [facility] DPR by telephone.   HIPAA identifiers x 2 verified  Patient participated in TOC-30 day program and has had weekly TOC calls x 4 weeks: caregiver denies need for ongoing care management outreach and confirms has my direct phone number should needs arise in the future- TOC case closure accordingly  Background:  Recent hospitalization September 23-25, 2025 for surgical TAVR and 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; pneumonia Independent in self- care at baseline; resides alone at baseline- supportive family assists as indicated- manages medications- uses walker at baseline; patient currently residing with son [name] post- recent hospital discharge 0 unplanned hospital admissions x last (6)/ (12) months  Initial Transition Care Management Follow-up Telephone Call Discharge Date and Diagnosis: 07/05/24, Surgical TAVR with PPM insertion; pneumonia   Past Medical History:  Diagnosis Date   Acute respiratory failure due to COVID-19 (HCC) 07/07/2020   ANEMIA 02/18/2010   Qualifier: Diagnosis of  By: Mahlon MD, Comer     Arthritis 12/2005   left hand   Arthritis    Left knee   Atrial fibrillation (HCC) 07/24/2013   Blood transfusion without reported diagnosis    Bursitis of knee    right knee   CAD (coronary artery disease)    Cardiac arrhythmia due to congenital heart disease    Chronic anticoagulation 06/12/2015   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 Verlin and Dr. Shyrl   Scrotal mass 03/07/2012   Spinal stenosis 01/20/2005   Tubular adenoma of colon    Assessment:  Things still going really well; he is getting stronger but did have a fall in the morning when it was dark, on Monday- he did not injure himself, and Dr. Mahlon said there were no concerning issues when she saw him yesterday.  We are considering getting him a life-alert system and once he goes back to his apartment, considering getting some private duty help periodically.  We will start looking into all of that soon.  We have seen the cardiology doctor and Dr. Mahlon this week and they both gave him good reports; he is doing fine, pretty much back to his normal self, and you have called us  for 4 weeks now.  We don't need any more calls-- if something comes up, I will either call you or Dr. Mahlon; we have appreciated the extra support you have given us  after this hospital visit but we are good now    Caregiver/ son denies  clinical concerns and reports patient in no distress  Patient Reported Symptoms: Cognitive Cognitive Status: Alert and oriented to person, place, and time, Normal speech and language skills, Insightful and able to interpret abstract concepts (per caregiver/ son [name]) Cognitive/Intellectual Conditions Management [RPT]: None reported or documented in medical history or problem list      Neurological Neurological Review of Symptoms: No symptoms reported    HEENT HEENT Symptoms Reported: No symptoms reported (per caregiver/ son [name])      Cardiovascular Cardiovascular  Symptoms Reported: No symptoms reported (per son/ caregiver [name]) Other Cardiovascular Symptoms: Confirmed normal puffy feet as per baseline: Dr. Mahlon and the heart doctor were not concerned when they saw him recently; Confirmed attended recent cardiology provider appointment 07/18/24: they gave him a real good report and had no concerns Does patient have uncontrolled Hypertension?: No Cardiovascular Management Strategies: Adequate rest, Medical device, Coping strategies, Medication therapy, Routine screening  Respiratory Respiratory Symptoms Reported: No symptoms reported (per son/ caregiver [name]) Other Respiratory Symptoms: No shortness of breath, he is breathing just fine Respiratory Management Strategies: Coping strategies, Routine screening, Adequate rest  Endocrine Endocrine Symptoms Reported:  (per son/ caregiver [name]) Is patient diabetic?: Yes Is patient checking blood sugars at home?: Yes List most recent blood sugar readings, include date and time of day: Sugars are running fine like they have been; Dr. Mahlon was very happy yesterday with his numbers and with his updated A1-C  Reviewed most recent A1-C result from 07/25/24: 6.8    Gastrointestinal Gastrointestinal Symptoms Reported: No symptoms reported (per son/ caregiver [name]) Additional Gastrointestinal Details: Eating great, having normal and regular BM's      Genitourinary Genitourinary Symptoms Reported: No symptoms reported (per son/ caregiver [name])    Integumentary Integumentary Symptoms Reported: No symptoms reported (per son/ caregiver [name]) Additional Integumentary Details: Confirms attended 07/18/24 cardiology provider office visit for pacemaker follow up: They agreed that the incision has completely healed and gave him a great report Skin Management Strategies: Routine screening  Musculoskeletal Musculoskelatal Symptoms Reviewed: Limited mobility (per son/ caregiver [name]) Additional  Musculoskeletal Details: confirmed uses assistive devices on regular basis, at baseline -- walker: prn only; reports had recent fall without injury on Monday 07/23/24: reports Dr. Mahlon aware and evaluated for injury at time of 07/25/24 office visit: she said there was no apparent serious injury and suggested we think about getting a life-alert system, so we are looking into that Musculoskeletal Management Strategies: Medical device, Coping strategies, Routine screening, Adequate rest Falls in the past year?: Yes Number of falls in past year: 2 or more Was there an injury with Fall?: Yes Fall Risk Category Calculator: 3 Patient Fall Risk Level: High Fall Risk Patient at Risk for Falls Due to: History of fall(s), Impaired mobility, Impaired vision Fall risk Follow up: Falls prevention discussed, Education provided  Psychosocial Psychosocial Symptoms Reported: No symptoms reported (per son/ caregiver [name]) Additional Psychological Details: Entirety of call today completed with caregiver Behavioral Management Strategies: Support system, Coping strategies Major Change/Loss/Stressor/Fears (CP): Denies Techniques to Cope with Loss/Stress/Change: Diversional activities Quality of Family Relationships: involved, supportive, helpful   There were no vitals filed for this visit.  Medications Reviewed Today     Reviewed by Aryahna Spagna M, RN (Registered Nurse) on 07/26/24 at 1000  Med List Status: <None>   Medication Order Taking? Sig Documenting Provider Last Dose Status Informant  allopurinol  (ZYLOPRIM ) 100 MG tablet [id number]  Take 1 tablet (100 mg total) by mouth daily. Tabori, Katherine E, MD  Active Self, Pharmacy Records  amLODipine  (NORVASC ) 2.5 MG tablet [id number]  Take 1 tablet by mouth once daily Jeffrie Oneil BROCKS, MD  Active Self, Pharmacy Records  atorvastatin  (LIPITOR) 40 MG tablet [id number]  TAKE 1 TABLET BY MOUTH AT BEDTIME Tabori, Katherine E, MD  Active   azithromycin  (ZITHROMAX )  500 MG tablet [id number]  Take 1 tablet (500 mg total) by mouth as directed. Take one tablet 1 hour before any dental work including cleanings. Sebastian Lamarr SAUNDERS, PA-C  Active   BESIVANCE 0.6 % SUSP [id number]  Place 1 drop into the left eye See admin instructions. Administer 1 drop into the left eye four times daily for 2 days. Start the day before eye injection and continue for 1 day following. [provider]  Active Self, Pharmacy Records  CALCIUM  PO [id number]  Take 1 tablet by mouth daily. [provider]  Active Self, Pharmacy Records  cefUROXime  (CEFTIN ) 500 MG tablet [id number]  Take 1 tablet (500 mg total) by mouth 2 (two) times daily with a meal. Sebastian Lamarr SAUNDERS, PA-C  Active   cetirizine  (ZYRTEC ) 10 MG tablet [id number]  Take 1 tablet by mouth once daily  Patient taking differently: Take 10 mg by mouth daily. As needed   Tabori, Katherine E, MD  Active Self, Pharmacy Records  diclofenac  Sodium (VOLTAREN ) 1 % GEL [id number]  Apply 2 g topically 4 (four) times daily. [name] BRAVO, MD  Active Self, Pharmacy Records  Ferrous Sulfate (IRON PO) [id number]  Take 1 tablet by mouth daily. [provider]  Active Self, Pharmacy Records  furosemide  (LASIX ) 40 MG tablet [id number]  Take 1 tablet by mouth once daily Tabori, Katherine E, MD  Active Self, Pharmacy Records  glucose blood [facility] VERIO) test strip [id number]  USE 1 STRIP TO CHECK GLUCOSE TWICE DAILY AS DIRECTED Tabori, Katherine E, MD  Active   Lancets [facility] ULTRASOFT) lancets [id number]  Use as instructed Tabori, Katherine E, MD  Active Self, Pharmacy Records  latanoprost  (XALATAN ) 0.005 % ophthalmic solution [id number]  Place 1 drop into both eyes at bedtime. [provider]  Active Self, Pharmacy Records  levothyroxine  (SYNTHROID ) 75 MCG tablet [id number]  Take 1 tablet by mouth once daily Tabori, Katherine E, MD  Active Self, Pharmacy Records  metFORMIN  (GLUCOPHAGE ) 1000 MG tablet [id number]   Take 1 tablet by mouth twice daily Tabori, Katherine E, MD  Active Self, Pharmacy Records  metoprolol  tartrate (LOPRESSOR ) 100 MG tablet [id number]  Take 1 tablet by mouth twice daily Tabori, Katherine E, MD  Active   Multiple Vitamins-Minerals (MULTIVITAMIN MEN 50+) TABS [id number]  Take 1 tablet by mouth daily. [provider]  Active Self, Pharmacy Records  Multiple Vitamins-Minerals (ZINC PO) [id number]  Take 1 tablet by mouth daily. [provider]  Active Self, Pharmacy Records  Omega-3 Fatty Acids (FISH OIL PO) [id number]  Take 2 capsules by mouth daily.  Patient taking differently: Take 2 capsules by mouth daily. 07/06/24: Reports during TOC call- currently on hold until patient has scheduled swallow test   [provider]  Active Self, Pharmacy Records  tamsulosin  (FLOMAX ) 0.4 MG CAPS capsule [id number]  Take 1 capsule by mouth once daily Tabori, Katherine E, MD  Active Self, Pharmacy Records  timolol  (TIMOPTIC ) 0.5 % ophthalmic solution [id number]  Place 1 drop into both eyes daily.  [provider]  Active Self, Pharmacy Records  tolterodine  (DETROL ) 1 MG tablet [id number]  Take 1 tablet (1 mg total) by mouth 2 (two) times daily.  Tabori, Katherine E, MD  Active Self, Pharmacy Records  warfarin (COUMADIN ) 2.5 MG tablet [id number]  TAKE 1/2 TO 1 (ONE-HALF TO ONE) TABLET BY MOUTH ONCE DAILY OR  AS  DIRECTED  BY  ANTICOAGULATION  CLINIC  Patient taking differently: Take 1.25-2.5 mg by mouth See admin instructions. 07/06/24: Verified during TOC call-- coumadin  currently on hold after recent TAVR until 07/10/24  Take 1/2 tablet (1.25mg ) by mouth on Saturday and take 1 tablet (2.5mg ) on all other days.   Jeffrie Oneil BROCKS, MD  Active Self, Pharmacy Records           Recommendation:   Continue Current Plan of Care  Follow Up Plan:   Closing From:  Transitions of Care Program- caregiver declines referral to [facility] longitudinal care management team  Pls call/ message  for questions,  Colie Fugitt Mckinney Zoraida Havrilla, RN, BSN, CCRN Alumnus RN Care Manager  Transitions of Care  [facility] - [facility] [phone]: direct office

## 2024-08-07 ENCOUNTER — Other Ambulatory Visit: Payer: Self-pay | Admitting: Family Medicine

## 2024-08-08 ENCOUNTER — Encounter

## 2024-08-08 NOTE — Progress Notes (Unsigned)
 HEART AND VASCULAR CENTER   MULTIDISCIPLINARY HEART VALVE CLINIC                                     Cardiology Office Note:    Date:  08/09/2024   ID:  Marvin Foster, DOB 02/03/1934, MRN [id number]  PCP:  [name] BRAVO, MD  [facility] Cardiologist:  Oneil Parchment, MD  [facility] Structural heart: Lonni Cash, MD [facility] Electrophysiologist:  OLE ONEIDA HOLTS, MD   Referring MD: [name] BRAVO, MD   1 month s/p TAVR  History of Present Illness:    Marvin Foster is a 89 y.o. 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    He has been followed by Dr. Cash in the valve clinic since at least 2024 for severe asymptomatic LFLG AS. Echo 12/19/23 with LVEF 60-65%, mild to moderate MR and LFLG severe AS with mean gradient 25 mmHg, AVA 0.58 cm2, DI 0.2, SVI 26. LHC 05/16/24 showed mild non obst CAD. S/p successful TAVR with a 26 mm Edwards Sapien 3 Ultra Resilia THV via the TF approach on 07/03/24. Post operative echo showed EF 70%, normally functioning TAVR with a mean gradient of 8 mmHg and no PVL. He developed post op CHB s/p single chamber permanent pacemaker with left bundle area lead by Dr. Holts on 07/04/24. Coumadin  held for 5 days. He developed fevers and chills and CXR suspicious for PNA. Discharged on Ceftin  500mg  BID.   Today the patient presents to clinic for follow up. Here with son. No CP. Has some mild SOB with walking a block on level ground. No LE edema, orthopnea or PND. No dizziness or syncope. No blood in stool or urine. No palpitations. Mostly limited by fatigue but is very sedentary. Sits in a chair all day watching TV. Willing to do cardiac rehab.     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 Cash and Dr. Shyrl  Scrotal mass 03/07/2012   Spinal stenosis 01/20/2005   Tubular adenoma of colon      Current Medications: Current Meds  Medication Sig   allopurinol  (ZYLOPRIM ) 100 MG tablet Take 1 tablet (100 mg total) by mouth daily.   amLODipine  (NORVASC ) 2.5 MG tablet Take 1 tablet by mouth once daily   atorvastatin  (LIPITOR) 40 MG tablet TAKE 1 TABLET BY MOUTH AT BEDTIME   azithromycin  (ZITHROMAX ) 500 MG tablet Take 1 tablet (500 mg total) by mouth as directed. Take one tablet 1 hour before any dental work including cleanings.   BESIVANCE 0.6 % SUSP Place 1 drop into the left eye See admin instructions. Administer 1 drop into the left eye four times daily for 2 days. Start the day before eye injection and continue for 1 day following.   CALCIUM  PO Take 1 tablet by mouth daily.   cefUROXime  (CEFTIN ) 500 MG tablet Take 1 tablet (500 mg total) by mouth 2  (two) times daily with a meal.   cetirizine  (ZYRTEC ) 10 MG tablet Take 1 tablet by mouth once daily   diclofenac  Sodium (VOLTAREN ) 1 % GEL Apply 2 g topically 4 (four) times daily.   Ferrous Sulfate (IRON PO) Take 1 tablet by mouth daily.   furosemide  (LASIX ) 40 MG tablet Take 1 tablet by mouth once daily   glucose blood (ONETOUCH VERIO) test strip USE 1 STRIP TO CHECK GLUCOSE TWICE DAILY AS DIRECTED   Lancets (ONETOUCH ULTRASOFT) lancets Use as instructed   latanoprost  (XALATAN ) 0.005 % ophthalmic solution Place 1 drop into both eyes at bedtime.   levothyroxine  (SYNTHROID ) 75 MCG tablet Take 1 tablet by mouth once daily   metFORMIN  (GLUCOPHAGE ) 1000 MG tablet Take 1 tablet by mouth twice daily   metoprolol  tartrate (LOPRESSOR ) 100 MG tablet Take 1 tablet by mouth twice daily   Multiple Vitamins-Minerals (MULTIVITAMIN MEN 50+) TABS Take 1 tablet by mouth daily.   Multiple Vitamins-Minerals (ZINC PO) Take 1 tablet by mouth daily.   Omega-3 Fatty Acids (FISH OIL PO) Take 2 capsules by mouth daily.   tamsulosin  (FLOMAX ) 0.4 MG CAPS capsule Take 1 capsule by mouth once daily   timolol  (TIMOPTIC ) 0.5 % ophthalmic solution Place 1 drop into both eyes daily.    tolterodine  (DETROL ) 1 MG tablet Take 1 tablet by mouth twice daily   warfarin (COUMADIN ) 2.5 MG tablet TAKE 1/2 TO 1 (ONE-HALF TO ONE) TABLET BY MOUTH ONCE DAILY OR  AS  DIRECTED  BY  ANTICOAGULATION  CLINIC      ROS:   Please see the history of present illness.    All other systems reviewed and are negative.  EKGs       Risk Assessment/Calculations:    CHA2DS2-VASc Score =     This indicates a  % annual risk of stroke. The patient's score is based upon:           Physical Exam:    VS:  There were no vitals taken for this visit.    Wt Readings from Last 3 Encounters:  07/25/24 208 lb 2 oz (94.4 kg)  07/09/24 209 lb (94.8 kg)  07/05/24 200 lb 13.4 oz (91.1 kg)     GEN: Well nourished, well developed in no acute  distress NECK: No JVD CARDIAC: RRR, soft flow murmur @ RUSB. No rubs, gallops RESPIRATORY:  Clear to auscultation without rales, wheezing or rhonchi  ABDOMEN: Soft, non-tender, non-distended EXTREMITIES:  No edema; No deformity.   ASSESSMENT:    1.  S/P TAVR (transcatheter aortic valve replacement)   2. S/P placement of cardiac pacemaker   3. Permanent atrial fibrillation (HCC)   4. Coronary artery disease involving native coronary artery of native heart without angina pectoris   5. Essential hypertension      PLAN:    In order of problems listed above:  Severe AS s/p TAVR:  -- Echo today shows EF 60%, mild MR, normally functioning TAVR with a mean gradient of 6 mm hg and no PVL.  -- NYHA II mostly of fatigue. I highly encouraged participation in cardiac rehab and he is willing to go.  -- SBE prophylaxis discussed; I have RX'd azithromycin  due to a PCN allergy. Cleared to proceed with dental work. -- Continue Coumadin .  -- I will see back for 1 year echo and OV.  S/p PPM: -- With CHB after TAVR.  -- S/p PPM with a single chamber permanent pacemaker with left bundle area lead by Dr. Cindie on 07/04/24.   Persistent atrial fibrillation: -- Continue Lopressor  100mg  BID. -- Continue Coumadin    CAD: -- S/p LAD stent in 2006 -- LHC 05/16/24 showed mild non obst CAD.  -- Continue medical therapy.  -- Continue atorvastatin  40mg  daily.  -- No aspirin  givne OAC.   HTN: -- BP well controlled.  -- Continue Norvasc  2.5mg  daily.      Cardiac Rehabilitation Eligibility Assessment       Medication Adjustments/Labs and Tests Ordered: Current medicines are reviewed at length with the patient today.  Concerns regarding medicines are outlined above.  No orders of the defined types were placed in this encounter.  No orders of the defined types were placed in this encounter.   Patient Instructions  Medication Instructions:  Your physician recommends that you continue on your  current medications as directed. Please refer to the Current Medication list given to you today.  *If you need a refill on your cardiac medications before your next appointment, please call your pharmacy*  Lab Work: None needed If you have labs (blood work) drawn today and your tests are completely normal, you will receive your results only by: MyChart Message (if you have MyChart) OR A paper copy in the mail If you have any lab test that is abnormal or we need to change your treatment, we will call you to review the results.  Testing/Procedures: 07/08/25 Your physician has requested that you have an echocardiogram. Echocardiography is a painless test that uses sound waves to create images of your heart. It provides your doctor with information about the size and shape of your heart and how well your heart's chambers and valves are working. This procedure takes approximately one hour. There are no restrictions for this procedure. Please do NOT wear cologne, perfume, aftershave, or lotions (deodorant is allowed). Please arrive 15 minutes prior to your appointment time.  Please note: We ask at that you not bring children with you during ultrasound (echo/ vascular) testing. Due to room size and safety concerns, children are not allowed in the ultrasound rooms during exams. Our front office staff cannot provide observation of children in our lobby area while testing is being conducted. An adult accompanying a patient to their appointment will only be allowed in the ultrasound room at the discretion of the ultrasound technician under special circumstances. We apologize for any inconvenience.   Follow-Up: At [facility], you and your health needs are our priority.  As part of our continuing mission to provide you with exceptional heart care,  our providers are all part of one team.  This team includes your primary Cardiologist (physician) and Advanced Practice Providers or APPs (Physician  Assistants and Nurse Practitioners) who all work together to provide you with the care you need, when you need it.  Your next appointment:   As scheduled on 07/09/23  Provider:   Izetta Hummer, PA-C  We recommend signing up for the patient portal called MyChart.  Sign up information is provided on this After Visit Summary.  MyChart is used to connect with patients for Virtual Visits (Telemedicine).  Patients are able to view lab/test results, encounter notes, upcoming appointments, etc.  Non-urgent messages can be sent to your provider as well.   To learn more about what you can do with MyChart, go to [email or web address].          Signed, Lamarr Hummer, PA-C  08/09/2024 3:58 PM    [facility]

## 2024-08-09 ENCOUNTER — Ambulatory Visit: Payer: Self-pay | Admitting: Physician Assistant

## 2024-08-09 ENCOUNTER — Ambulatory Visit
Admission: RE | Admit: 2024-08-09 | Discharge: 2024-08-09 | Disposition: A | Discharge status: Home / Self Care | Source: Ambulatory Visit | Attending: Internal Medicine | Admitting: Internal Medicine

## 2024-08-09 ENCOUNTER — Ambulatory Visit (INDEPENDENT_AMBULATORY_CARE_PROVIDER_SITE_OTHER): Admitting: Physician Assistant

## 2024-08-09 ENCOUNTER — Ambulatory Visit (INDEPENDENT_AMBULATORY_CARE_PROVIDER_SITE_OTHER)

## 2024-08-09 DIAGNOSIS — I2510 Atherosclerotic heart disease of native coronary artery without angina pectoris: Secondary | ICD-10-CM | POA: Diagnosis not present

## 2024-08-09 DIAGNOSIS — I4891 Unspecified atrial fibrillation: Secondary | ICD-10-CM

## 2024-08-09 DIAGNOSIS — Z952 Presence of prosthetic heart valve: Secondary | ICD-10-CM | POA: Diagnosis not present

## 2024-08-09 DIAGNOSIS — Z5181 Encounter for therapeutic drug level monitoring: Secondary | ICD-10-CM | POA: Diagnosis not present

## 2024-08-09 DIAGNOSIS — I4821 Permanent atrial fibrillation: Secondary | ICD-10-CM

## 2024-08-09 DIAGNOSIS — I10 Essential (primary) hypertension: Secondary | ICD-10-CM | POA: Diagnosis not present

## 2024-08-09 DIAGNOSIS — Z950 Presence of cardiac pacemaker: Secondary | ICD-10-CM | POA: Insufficient documentation

## 2024-08-09 DIAGNOSIS — I350 Nonrheumatic aortic (valve) stenosis: Secondary | ICD-10-CM | POA: Diagnosis not present

## 2024-08-09 DIAGNOSIS — Z0000 Encounter for general adult medical examination without abnormal findings: Secondary | ICD-10-CM | POA: Diagnosis not present

## 2024-08-09 LAB — ECHOCARDIOGRAM COMPLETE
AR max vel: 2.17 cm2
AV Area VTI: 2.04 cm2
AV Area mean vel: 2.06 cm2
AV Mean grad: 6 mmHg
AV Peak grad: 11.3 mmHg
Ao pk vel: 1.68 m/s
Area-P 1/2: 3.56 cm2
MV M vel: 4.77 m/s
MV Peak grad: 91 mmHg
S' Lateral: 2.6 cm

## 2024-08-09 LAB — POCT INR: INR: 1.7 — AB (ref 2.0–3.0)

## 2024-08-09 NOTE — Patient Instructions (Signed)
*  DOES NOT NEED A PRINTOUT* Take 1.5 tablets today only then Increase to 1 tablet daily.  Stay consistent with greens each week (3 per week). INR in 3 weeks  Call us  with any medication changes or concerns # [phone] Coumadin  Clinic.   Allopurinol  Daily started 1/3

## 2024-08-09 NOTE — Progress Notes (Signed)
 INR 1.7 Please see anticoagulation encounter *DOES NOT NEED A PRINTOUT* Take 1.5 tablets today only then Increase to 1 tablet daily.  Stay consistent with greens each week (3 per week). INR in 3 weeks  Call us  with any medication changes or concerns # [phone] Coumadin  Clinic.   Allopurinol  Daily started 1/3

## 2024-08-09 NOTE — Patient Instructions (Signed)
 Medication Instructions:  Your physician recommends that you continue on your current medications as directed. Please refer to the Current Medication list given to you today.  *If you need a refill on your cardiac medications before your next appointment, please call your pharmacy*  Lab Work: None needed If you have labs (blood work) drawn today and your tests are completely normal, you will receive your results only by: MyChart Message (if you have MyChart) OR A paper copy in the mail If you have any lab test that is abnormal or we need to change your treatment, we will call you to review the results.  Testing/Procedures: 07/08/25 Your physician has requested that you have an echocardiogram. Echocardiography is a painless test that uses sound waves to create images of your heart. It provides your doctor with information about the size and shape of your heart and how well your heart's chambers and valves are working. This procedure takes approximately one hour. There are no restrictions for this procedure. Please do NOT wear cologne, perfume, aftershave, or lotions (deodorant is allowed). Please arrive 15 minutes prior to your appointment time.  Please note: We ask at that you not bring children with you during ultrasound (echo/ vascular) testing. Due to room size and safety concerns, children are not allowed in the ultrasound rooms during exams. Our front office staff cannot provide observation of children in our lobby area while testing is being conducted. An adult accompanying a patient to their appointment will only be allowed in the ultrasound room at the discretion of the ultrasound technician under special circumstances. We apologize for any inconvenience.   Follow-Up: At [facility], you and your health needs are our priority.  As part of our continuing mission to provide you with exceptional heart care, our providers are all part of one team.  This team includes your primary  Cardiologist (physician) and Advanced Practice Providers or APPs (Physician Assistants and Nurse Practitioners) who all work together to provide you with the care you need, when you need it.  Your next appointment:   As scheduled on 07/09/23  Provider:   Izetta Hummer, PA-C  We recommend signing up for the patient portal called MyChart.  Sign up information is provided on this After Visit Summary.  MyChart is used to connect with patients for Virtual Visits (Telemedicine).  Patients are able to view lab/test results, encounter notes, upcoming appointments, etc.  Non-urgent messages can be sent to your provider as well.   To learn more about what you can do with MyChart, go to [email or web address].

## 2024-08-10 ENCOUNTER — Telehealth (HOSPITAL_COMMUNITY): Payer: Self-pay | Admitting: *Deleted

## 2024-08-10 NOTE — Telephone Encounter (Signed)
 Chart reviewed. Patient appropriate for scheduling for exercise at cardiac rehab.Hadassah Elpidio Quan RN BSN

## 2024-08-15 ENCOUNTER — Telehealth (HOSPITAL_COMMUNITY): Payer: Self-pay

## 2024-08-15 NOTE — Telephone Encounter (Signed)
 Called patient to see if he was interested in participating in the Cardiac Rehab Program. Spoke with patient's daughter, DPR on file. Patient will come in for orientation on 11/19 and will attend the 8:15 exercise class on Mondays & Wednesdays.  Sent MyChart message.

## 2024-08-15 NOTE — Telephone Encounter (Signed)
 Pt insurance is active and benefits verified through [facility]. Co-pay $40, DED $0/$0 met, out of pocket $4,150/$3,124.38 met, co-insurance 0%. No pre-authorization required. 08/15/2024 @ 9:04am, spoke with Braun, REF# [id number].  TCR/ICR? ICR Visit(date of service)limitation? No Can multiple codes be used on the same date of service/visit?(IF ITS A LIMIT) N/A  Is this a lifetime maximum or an annual maximum? Annual Has the member used any of these services to date? No Is there a time limit (weeks/months) on start of program and/or program completion? No

## 2024-08-17 ENCOUNTER — Ambulatory Visit

## 2024-08-17 DIAGNOSIS — I442 Atrioventricular block, complete: Secondary | ICD-10-CM

## 2024-08-19 LAB — CUP PACEART REMOTE DEVICE CHECK
Battery Remaining Longevity: 108 mo
Battery Remaining Percentage: 100 %
Brady Statistic RV Percent Paced: 100 %
Date Time Interrogation Session: 20251107041200
Implantable Lead Connection Status: 753985
Implantable Lead Implant Date: 20250924
Implantable Lead Location: 753860
Implantable Lead Model: 7842
Implantable Lead Serial Number: 1510463
Implantable Pulse Generator Implant Date: 20250924
Lead Channel Impedance Value: 636 Ohm
Lead Channel Pacing Threshold Amplitude: 0.9 V
Lead Channel Pacing Threshold Pulse Width: 0.4 ms
Lead Channel Setting Pacing Amplitude: 3.5 V
Lead Channel Setting Pacing Pulse Width: 0.4 ms
Lead Channel Setting Sensing Sensitivity: 3.5 mV
Pulse Gen Serial Number: 955381
Zone Setting Status: 755011

## 2024-08-20 ENCOUNTER — Ambulatory Visit: Payer: Self-pay | Admitting: Cardiology

## 2024-08-21 NOTE — Progress Notes (Signed)
 Remote PPM Transmission

## 2024-08-22 ENCOUNTER — Telehealth (HOSPITAL_COMMUNITY): Payer: Self-pay

## 2024-08-22 NOTE — Telephone Encounter (Signed)
 Changed pt to TCR because his insurance.

## 2024-08-27 ENCOUNTER — Encounter: Payer: Self-pay | Admitting: Family Medicine

## 2024-08-27 ENCOUNTER — Telehealth (HOSPITAL_COMMUNITY): Payer: Self-pay

## 2024-08-27 NOTE — Telephone Encounter (Signed)
 Patient's daughter Zebedee Molt called to cancel patient's CR orientation on 11/19, states patient fell over the weekend and is not ready to start rehab due to back pain. She states he has not seen a dr yet for it but they are going to schedule that today. Informed her we will cancel pt appts, they will call us  back if/when patient is ready to come to CR.  Closing referral.

## 2024-08-28 ENCOUNTER — Other Ambulatory Visit: Payer: Self-pay

## 2024-08-28 ENCOUNTER — Encounter (HOSPITAL_COMMUNITY): Payer: Self-pay

## 2024-08-28 ENCOUNTER — Emergency Department (HOSPITAL_COMMUNITY)
Admission: EM | Admit: 2024-08-28 | Discharge: 2024-08-29 | Disposition: A | Discharge status: Home / Self Care | Attending: Emergency Medicine | Admitting: Emergency Medicine

## 2024-08-28 ENCOUNTER — Emergency Department (HOSPITAL_COMMUNITY)

## 2024-08-28 DIAGNOSIS — R109 Unspecified abdominal pain: Secondary | ICD-10-CM | POA: Diagnosis not present

## 2024-08-28 DIAGNOSIS — Z79899 Other long term (current) drug therapy: Secondary | ICD-10-CM | POA: Diagnosis not present

## 2024-08-28 DIAGNOSIS — M545 Low back pain, unspecified: Secondary | ICD-10-CM | POA: Diagnosis not present

## 2024-08-28 DIAGNOSIS — R10A1 Flank pain, right side: Secondary | ICD-10-CM | POA: Diagnosis not present

## 2024-08-28 DIAGNOSIS — K8020 Calculus of gallbladder without cholecystitis without obstruction: Secondary | ICD-10-CM | POA: Diagnosis not present

## 2024-08-28 DIAGNOSIS — M549 Dorsalgia, unspecified: Secondary | ICD-10-CM | POA: Diagnosis not present

## 2024-08-28 DIAGNOSIS — R110 Nausea: Secondary | ICD-10-CM | POA: Diagnosis not present

## 2024-08-28 DIAGNOSIS — I10 Essential (primary) hypertension: Secondary | ICD-10-CM | POA: Diagnosis not present

## 2024-08-28 DIAGNOSIS — Z7984 Long term (current) use of oral hypoglycemic drugs: Secondary | ICD-10-CM | POA: Insufficient documentation

## 2024-08-28 DIAGNOSIS — R918 Other nonspecific abnormal finding of lung field: Secondary | ICD-10-CM | POA: Diagnosis not present

## 2024-08-28 DIAGNOSIS — E119 Type 2 diabetes mellitus without complications: Secondary | ICD-10-CM | POA: Diagnosis not present

## 2024-08-28 DIAGNOSIS — K5900 Constipation, unspecified: Secondary | ICD-10-CM

## 2024-08-28 DIAGNOSIS — M48061 Spinal stenosis, lumbar region without neurogenic claudication: Secondary | ICD-10-CM | POA: Diagnosis not present

## 2024-08-28 DIAGNOSIS — J90 Pleural effusion, not elsewhere classified: Secondary | ICD-10-CM | POA: Diagnosis not present

## 2024-08-28 DIAGNOSIS — M4187 Other forms of scoliosis, lumbosacral region: Secondary | ICD-10-CM | POA: Diagnosis not present

## 2024-08-28 DIAGNOSIS — E039 Hypothyroidism, unspecified: Secondary | ICD-10-CM | POA: Insufficient documentation

## 2024-08-28 DIAGNOSIS — Z7901 Long term (current) use of anticoagulants: Secondary | ICD-10-CM | POA: Diagnosis not present

## 2024-08-28 DIAGNOSIS — I700 Atherosclerosis of aorta: Secondary | ICD-10-CM | POA: Diagnosis not present

## 2024-08-28 DIAGNOSIS — M47816 Spondylosis without myelopathy or radiculopathy, lumbar region: Secondary | ICD-10-CM | POA: Diagnosis not present

## 2024-08-28 DIAGNOSIS — I4891 Unspecified atrial fibrillation: Secondary | ICD-10-CM | POA: Insufficient documentation

## 2024-08-28 DIAGNOSIS — J9811 Atelectasis: Secondary | ICD-10-CM | POA: Diagnosis not present

## 2024-08-28 DIAGNOSIS — Z043 Encounter for examination and observation following other accident: Secondary | ICD-10-CM | POA: Diagnosis not present

## 2024-08-28 DIAGNOSIS — M160 Bilateral primary osteoarthritis of hip: Secondary | ICD-10-CM | POA: Diagnosis not present

## 2024-08-28 LAB — CBC WITH DIFFERENTIAL/PLATELET
Abs Immature Granulocytes: 0.03 K/uL (ref 0.00–0.07)
Basophils Absolute: 0.1 K/uL (ref 0.0–0.1)
Basophils Relative: 1 %
Eosinophils Absolute: 0.3 K/uL (ref 0.0–0.5)
Eosinophils Relative: 4 %
HCT: 36.3 % — ABNORMAL LOW (ref 39.0–52.0)
Hemoglobin: 12 g/dL — ABNORMAL LOW (ref 13.0–17.0)
Immature Granulocytes: 0 %
Lymphocytes Relative: 24 %
Lymphs Abs: 2 K/uL (ref 0.7–4.0)
MCH: 31.5 pg (ref 26.0–34.0)
MCHC: 33.1 g/dL (ref 30.0–36.0)
MCV: 95.3 fL (ref 80.0–100.0)
Monocytes Absolute: 0.9 K/uL (ref 0.1–1.0)
Monocytes Relative: 10 %
Neutro Abs: 5.3 K/uL (ref 1.7–7.7)
Neutrophils Relative %: 61 %
Platelets: 163 K/uL (ref 150–400)
RBC: 3.81 MIL/uL — ABNORMAL LOW (ref 4.22–5.81)
RDW: 14.6 % (ref 11.5–15.5)
WBC: 8.6 K/uL (ref 4.0–10.5)
nRBC: 0 % (ref 0.0–0.2)

## 2024-08-28 LAB — PROTIME-INR
INR: 2.7 — ABNORMAL HIGH (ref 0.8–1.2)
Prothrombin Time: 29.9 s — ABNORMAL HIGH (ref 11.4–15.2)

## 2024-08-28 LAB — COMPREHENSIVE METABOLIC PANEL WITH GFR
ALT: 20 U/L (ref 0–44)
AST: 30 U/L (ref 15–41)
Albumin: 3.5 g/dL (ref 3.5–5.0)
Alkaline Phosphatase: 68 U/L (ref 38–126)
Anion gap: 18 — ABNORMAL HIGH (ref 5–15)
BUN: 27 mg/dL — ABNORMAL HIGH (ref 8–23)
CO2: 24 mmol/L (ref 22–32)
Calcium: 9.5 mg/dL (ref 8.9–10.3)
Chloride: 94 mmol/L — ABNORMAL LOW (ref 98–111)
Creatinine, Ser: 1.39 mg/dL — ABNORMAL HIGH (ref 0.61–1.24)
GFR, Estimated: 49 mL/min — ABNORMAL LOW (ref >60)
Glucose, Bld: 146 mg/dL — ABNORMAL HIGH (ref 70–99)
Potassium: 4.5 mmol/L (ref 3.5–5.1)
Sodium: 136 mmol/L (ref 135–145)
Total Bilirubin: 1.1 mg/dL (ref 0.0–1.2)
Total Protein: 7.2 g/dL (ref 6.5–8.1)

## 2024-08-28 LAB — URINALYSIS, ROUTINE W REFLEX MICROSCOPIC
Bilirubin Urine: NEGATIVE
Glucose, UA: NEGATIVE mg/dL
Hgb urine dipstick: NEGATIVE
Ketones, ur: NEGATIVE mg/dL
Leukocytes,Ua: NEGATIVE
Nitrite: NEGATIVE
Protein, ur: NEGATIVE mg/dL
Specific Gravity, Urine: 1.011 (ref 1.005–1.030)
pH: 7 (ref 5.0–8.0)

## 2024-08-28 LAB — LIPASE, BLOOD: Lipase: 33 U/L (ref 11–51)

## 2024-08-28 MED ORDER — IOHEXOL 350 MG/ML SOLN
75.0000 mL | Freq: Once | INTRAVENOUS | Status: AC | PRN
  Administered 2024-08-28: 75 mL via INTRAVENOUS

## 2024-08-28 NOTE — ED Provider Notes (Signed)
  Accepted handoff at shift change from Dr. Cleotilde RIGGERS. Please see prior provider note for more detail.   Briefly: Patient is 89 y.o. presenting after a low height fall on Saturday. Per the patient and his wife it was a slow fall where he just kind of stumbled and went to the floor. Did not hit his head, did not lose consciousness, landed on his backside. Has been somewhat sore since the fall but this morning he was having trouble getting up out of bed due to pain primarily on the right side. He also reports some right sided abdominal pain and nausea at that time. At time of exam his nausea had subsided and his pain was primarily in the back on the right side close to the spine. He denies any fevers, chills or other sick symptoms.  Plan:  - dispo pending CT Abd/Pelv - CT resulting with no acute abnormalities other than a large amount of stool. Patient has baseline memory problems and is unsure of when his last BM was. Family member at bedside stating that they have been trying to monitor his BM's and will provide patient with MiraLax  when they get home. Patient stating that most of pain is to the right of his lumbar spine - appears to be MSK related d/t recent mechanical fall. Educated patient and family members to use heat/ice, tylenol  and topical lidocaine  to help symptoms. Patient stating that he is ready to go home. - Patient afebrile with stable vitals.  Provided with return precautions.  Discharged in good condition.    Hoy Nidia FALCON, [place] 08/29/24 0001    Darra Fonda MATSU, MD 09/01/24 [phone]

## 2024-08-28 NOTE — ED Provider Triage Note (Signed)
 Emergency Medicine Provider Triage Evaluation Note  BRIGHT SPIELMANN , a 89 y.o. male  was evaluated in triage.  Pt complains of fall that happened 3 days ago.  Now consistently complaining of low back pain, low abdominal pain and bilateral hip pain.  They do report he seems to have constipation but no loss of bowel or bladder incontinence.  No saddle anesthesia, no radicular symptoms.  Seems to have baseline strength on exam.  Sensation intact.  Family is unclear what caused this whether it was dizziness/lightheadedness or if he tripped.  He denies any chest pain shortness of breath.  This fall was witnessed and he did not hit his head and he denies any head or neck pain.  Review of Systems  Positive: Fall, abd pain, back pain Negative: LOC  Physical Exam  BP (!) 112/57   Pulse (!) 57   Temp 97.7 F (36.5 C)   Resp 20   SpO2 95%  Gen:   Awake, no distress   Resp:  Normal effort  MSK:   Moves extremities without difficulty  Other:    Medical Decision Making  Medically screening exam initiated at 1:40 PM.  Appropriate orders placed.  [name] was informed that the remainder of the evaluation will be completed by another provider, this initial triage assessment does not replace that evaluation, and the importance of remaining in the ED until their evaluation is complete.  Workup started, limited due to being in triage.   Shermon Warren SAILOR, PA-C 08/28/24 1342

## 2024-08-28 NOTE — ED Triage Notes (Signed)
 Pt to er, pt states that he is here because he fell on Saturday, states that now he is having R sided abd and flank pain.  Pt denies hitting his head, denies loc.  Pt awake and oriented.

## 2024-08-28 NOTE — ED Notes (Signed)
 Called CT and informed them of line placement.

## 2024-08-28 NOTE — ED Provider Notes (Signed)
  EMERGENCY DEPARTMENT AT [facility] Provider Note   CSN: [id number] Arrival date & time: 08/28/24  1319     Patient presents with: Marvin Foster is a 89 y.o. male.   PMH including permanent A-fib with ventricular pacing, chronic anticoagulation with warfarin s/p TAVR, Hypothyroidism, spinal stenosis s/p decompression and discectomy, T2DM presenting after a low height fall on Saturday.  Per the patient and his wife it was a slow fall where he just kind of stumbled and went to the floor.  Did not hit his head, did not lose consciousness, landed on his backside.  Has been somewhat sore since the fall but this morning he was having trouble getting up out of bed due to pain primarily on the right side.  He also reports some right sided abdominal pain and nausea at that time.  At time of exam his nausea had subsided and his pain was primarily in the back on the right side close to the spine.  He denies any fevers, chills or other sick symptoms.   Fall       Prior to Admission medications   Medication Sig Start Date End Date Taking? Authorizing Provider  allopurinol  (ZYLOPRIM ) 100 MG tablet Take 1 tablet (100 mg total) by mouth daily. 11/16/23   Tabori, Katherine E, MD  amLODipine  (NORVASC ) 2.5 MG tablet Take 1 tablet by mouth once daily 07/30/24   Jeffrie Oneil BROCKS, MD  atorvastatin  (LIPITOR) 40 MG tablet TAKE 1 TABLET BY MOUTH AT BEDTIME 07/23/24   Tabori, Katherine E, MD  azithromycin  (ZITHROMAX ) 500 MG tablet Take 1 tablet (500 mg total) by mouth as directed. Take one tablet 1 hour before any dental work including cleanings. 07/09/24   Marvin Lamarr JONELLE, PA-C  BESIVANCE 0.6 % SUSP Place 1 drop into the left eye See admin instructions. Administer 1 drop into the left eye four times daily for 2 days. Start the day before eye injection and continue for 1 day following. 04/19/24   [provider]  CALCIUM  PO Take 1 tablet by mouth daily.    [provider]  cefUROXime  (CEFTIN ) 500 MG tablet Take 1 tablet (500 mg total) by mouth 2 (two) times daily with a meal. 07/05/24   Marvin Lamarr JONELLE, PA-C  cetirizine  (ZYRTEC ) 10 MG tablet Take 1 tablet by mouth once daily 06/01/24   Tabori, Katherine E, MD  diclofenac  Sodium (VOLTAREN ) 1 % GEL Apply 2 g topically 4 (four) times daily. 12/02/23   Tabori, Katherine E, MD  Ferrous Sulfate (IRON PO) Take 1 tablet by mouth daily.    [provider]  furosemide  (LASIX ) 40 MG tablet Take 1 tablet by mouth once daily 06/01/24   Tabori, Katherine E, MD  glucose blood (ONETOUCH VERIO) test strip USE 1 STRIP TO CHECK GLUCOSE TWICE DAILY AS DIRECTED 07/09/24   Tabori, Katherine E, MD  Lancets [facility] ULTRASOFT) lancets Use as instructed 02/02/19   Tabori, Katherine E, MD  latanoprost  (XALATAN ) 0.005 % ophthalmic solution Place 1 drop into both eyes at bedtime. 08/02/23   [provider]  levothyroxine  (SYNTHROID ) 75 MCG tablet Take 1 tablet by mouth once daily 06/07/24   Tabori, Katherine E, MD  metFORMIN  (GLUCOPHAGE ) 1000 MG tablet Take 1 tablet by mouth twice daily 06/13/24   Tabori, Katherine E, MD  metoprolol  tartrate (LOPRESSOR ) 100 MG tablet Take 1 tablet by mouth twice daily 07/23/24   Tabori, Katherine E, MD  Multiple Vitamins-Minerals (MULTIVITAMIN MEN 50+)  TABS Take 1 tablet by mouth daily.    [provider]  Multiple Vitamins-Minerals (ZINC PO) Take 1 tablet by mouth daily.    [provider]  Omega-3 Fatty Acids (FISH OIL PO) Take 2 capsules by mouth daily.    [provider]  tamsulosin  (FLOMAX ) 0.4 MG CAPS capsule Take 1 capsule by mouth once daily 06/08/24   Tabori, Katherine E, MD  timolol  (TIMOPTIC ) 0.5 % ophthalmic solution Place 1 drop into both eyes daily.  10/19/16   [provider]  tolterodine  (DETROL ) 1 MG tablet Take 1 tablet by mouth twice daily 08/07/24   Tabori, Katherine E, MD  warfarin (COUMADIN ) 2.5 MG tablet TAKE 1/2 TO 1  (ONE-HALF TO ONE) TABLET BY MOUTH ONCE DAILY OR  AS  DIRECTED  BY  ANTICOAGULATION  CLINIC 03/06/24   Jeffrie Oneil BROCKS, MD    Allergies: Neosporin original [neomycin-bacitracin zn-polymyx], Penicillins, Sulfa antibiotics, Vasotec  [enalapril ], and Zeasorb-af [miconazole nitrate]    Review of Systems  Updated Vital Signs BP 123/69 (BP Location: Right Arm)   Pulse (!) 59   Temp 98.3 F (36.8 C)   Resp 20   Ht 5' 8 (1.727 m)   Wt 94.4 kg   SpO2 96%   BMI 31.64 kg/m   Physical Exam Constitutional:      Appearance: Normal appearance.  HENT:     Mouth/Throat:     Mouth: Mucous membranes are moist.     Pharynx: Oropharynx is clear.  Eyes:     Extraocular Movements: Extraocular movements intact.     Pupils: Pupils are equal, round, and reactive to light.  Cardiovascular:     Rate and Rhythm: Normal rate and regular rhythm.  Pulmonary:     Effort: Pulmonary effort is normal.     Breath sounds: Normal breath sounds.  Abdominal:     General: Abdomen is flat. Bowel sounds are normal.     Palpations: Abdomen is soft.  Musculoskeletal:        General: No swelling. Normal range of motion.     Cervical back: Normal.     Thoracic back: Normal.     Lumbar back: Tenderness present. No bony tenderness.     Right lower leg: No edema.     Left lower leg: No edema.  Skin:    General: Skin is warm and dry.  Neurological:     Mental Status: He is alert.     (all labs ordered are listed, but only abnormal results are displayed) Labs Reviewed  COMPREHENSIVE METABOLIC PANEL WITH GFR - Abnormal; Notable for the following components:      Result Value   Chloride 94 (*)    Glucose, Bld 146 (*)    BUN 27 (*)    Creatinine, Ser 1.39 (*)    GFR, Estimated 49 (*)    Anion gap 18 (*)    All other components within normal limits  CBC WITH DIFFERENTIAL/PLATELET - Abnormal; Notable for the following components:   RBC 3.81 (*)    Hemoglobin 12.0 (*)    HCT 36.3 (*)    All other components  within normal limits  PROTIME-INR - Abnormal; Notable for the following components:   Prothrombin Time 29.9 (*)    INR 2.7 (*)    All other components within normal limits  LIPASE, BLOOD  URINALYSIS, ROUTINE W REFLEX MICROSCOPIC    EKG: None  Radiology: DG Pelvis 1-2 Views Result Date: 08/28/2024 CLINICAL DATA:  Right-sided abdominal and flank pain  since falling 3 days ago. EXAM: PELVIS - 1-2 VIEW COMPARISON:  Pelvic CT 05/23/2024. FINDINGS: No evidence of acute pelvic fracture. The sacroiliac joints and symphysis pubis are intact. Mild fairly symmetric degenerative changes at both hips. Scattered vascular calcifications are noted. IMPRESSION: No evidence of acute pelvic fracture. Mild degenerative changes at both hips. Electronically Signed   By: Elsie Perone M.D.   On: 08/28/2024 15:14   DG Lumbar Spine Complete Result Date: 08/28/2024 CLINICAL DATA:  Back pain since falling 3 days ago. EXAM: LUMBAR SPINE - COMPLETE 4+ VIEW COMPARISON:  Intraoperative lumbar spine radiograph 04/15/2005. Lumbar MRI 01/08/2016. FINDINGS: There are 5 lumbar type vertebral bodies. There is a minimal convex right scoliosis. Multilevel spondylosis with disc space narrowing, endplate osteophytes and bilateral facet hypertrophy. No evidence of acute fracture or traumatic subluxation. IMPRESSION: No evidence of acute lumbar spine fracture or traumatic subluxation. Multilevel spondylosis. Electronically Signed   By: Elsie Perone M.D.   On: 08/28/2024 15:12   DG Chest 1 View Result Date: 08/28/2024 CLINICAL DATA:  Fall. EXAM: CHEST  1 VIEW COMPARISON:  Chest radiograph dated 07/05/2024. FINDINGS: Small left pleural effusion and left lung base atelectasis. Pneumonia is not excluded. The right lung is clear. No pneumothorax. Stable cardiac silhouette. Aortic valve repair. Left pectoral pacemaker device. No acute osseous pathology. IMPRESSION: Small left pleural effusion and left lung base atelectasis.  Electronically Signed   By: Vanetta Chou M.D.   On: 08/28/2024 15:12     Procedures   Medications Ordered in the ED - No data to display                                  Medical Decision Making  This is an 89 year old gentleman with a past medical history that includes spinal stenosis decompression and discectomy in of the lumbar spine presenting after a low velocity fall for right sided back/abdominal pain.  Scans of the lumbar spine and pelvis and chest in triage were unremarkable except for a low volume left sided pleural effusion.  CMP is unremarkable, lipase negative, CBC unremarkable as well.  PT/INR within therapeutic range for anticoagulation status post TAVR.  Will proceed with CT chest abdomen pelvis to assess for any underlying injuries that may have been missed with plain films.  If unremarkable patient may be stable for discharge home.  Signed out to oncoming provider at shift change.  Amount and/or Complexity of Data Reviewed Radiology: ordered.  Risk OTC drugs. Prescription drug management.     Final diagnoses:  None    ED Discharge Orders     None          Cleotilde Lukes, DO 08/30/24 1604    Darra Fonda MATSU, MD 09/01/24 [phone]

## 2024-08-29 ENCOUNTER — Encounter (HOSPITAL_COMMUNITY)

## 2024-08-29 MED ORDER — LIDOCAINE 5 % EX PTCH: 1.0000 | MEDICATED_PATCH | CUTANEOUS | 0 refills | Status: AC

## 2024-08-29 MED ORDER — POLYETHYLENE GLYCOL 3350 17 GM/SCOOP PO POWD: 17.0000 g | Freq: Two times a day (BID) | ORAL | 0 refills | Status: DC

## 2024-08-29 NOTE — Discharge Instructions (Addendum)
 Please follow up with your primary care provider in the next 48-72 hours. Seek emergency care if experiencing any new or worsening symptoms.

## 2024-08-30 ENCOUNTER — Telehealth: Payer: Self-pay | Admitting: Cardiology

## 2024-08-30 ENCOUNTER — Ambulatory Visit

## 2024-08-30 NOTE — Telephone Encounter (Signed)
 Copied from CRM #[id number]. Topic: General - Other >> Aug 30, 2024 11:10 AM Alfonso HERO wrote: Reason for CRM: patient daughter calling because the patient has been constipated for 5 days and nothing is working. Looking to find out if there is anything they can give him to make him go. Miralax isnt working. Please call daughters phone [phone]

## 2024-08-30 NOTE — Telephone Encounter (Signed)
 Pt son called in asking to cancel inr check today because he had one in the hospital this week. He asked when should he come back for another appt.

## 2024-08-31 ENCOUNTER — Ambulatory Visit: Payer: Self-pay | Admitting: Family Medicine

## 2024-08-31 ENCOUNTER — Other Ambulatory Visit: Payer: Self-pay | Admitting: Family Medicine

## 2024-08-31 MED ORDER — OXYCODONE-ACETAMINOPHEN 5-325 MG PO TABS: 1.0000 | ORAL_TABLET | Freq: Three times a day (TID) | ORAL | 0 refills | Status: AC | PRN

## 2024-08-31 MED ORDER — OXYCODONE-ACETAMINOPHEN 5-325 MG PO TABS: 1.0000 | ORAL_TABLET | Freq: Three times a day (TID) | ORAL | 0 refills | Status: DC | PRN

## 2024-08-31 NOTE — Telephone Encounter (Signed)
 Pt seen at ED on 08/28/24 for low back pain. Tylenol  is not helping. Please advise? Appt?

## 2024-08-31 NOTE — Telephone Encounter (Signed)
 Will send in small quantity of Oxycodone  to take as needed for severe pain.  If not improving, will need to be evaluated next week.

## 2024-08-31 NOTE — Telephone Encounter (Signed)
 Called and LM informing pt Rx has been sent but please follow up next week if theres no improvement

## 2024-08-31 NOTE — Progress Notes (Signed)
 Prescription sent to pharmacy.

## 2024-08-31 NOTE — Telephone Encounter (Signed)
 FYI Only or Action Required?: Action required by provider: request for appointment, medication refill request, and clinical question for provider.  Patient was last seen in primary care on 07/25/2024 by [name] BRAVO, MD.  Called Nurse Triage reporting Pain.  Symptoms began several days ago.  Interventions attempted: OTC medications: tylenol .  Symptoms are: unchanged.  Triage Disposition: See HCP Within 4 Hours (Or PCP Triage)  Patient/caregiver understands and will follow disposition?: No, wishes to speak with PCP   Copied from CRM #[id number]. Topic: Clinical - Medical Advice >> Aug 31, 2024 11:59 AM Nessti S wrote: Reason for CRM: pt daughter called because tylenol  is not helping pts back pain. She would like to know if pcp can prescribe pain meds for pt.    ----------------------------------------------------------------------- From previous Reason for Contact - Pink Word Triage: Pink Word triggered transfer to Nurse Triage. See Triage Message for details. >> Aug 31, 2024 12:01 PM Nessti S wrote: Pt does have appt with back specialist but wont be seen until next week. Reason for Disposition  [1] SEVERE back pain (e.g., excruciating, unable to do any normal activities) AND [2] not improved 2 hours after pain medicine  Answer Assessment - Initial Assessment Questions No available appts today. Advised UC today and ED is if symptoms worsen.  Pt's daughter declines and request pain medication/appt with pcp.   1. ONSET: When did the pain begin? (e.g., minutes, hours, days)     S/p fall last Saturday, seen ED 2. LOCATION: Where does it hurt? (upper, mid or lower back)     Back pain; lower 3. SEVERITY: How bad is the pain?  (e.g., Scale 1-10; mild, moderate, or severe)     8/10; Tylenol  extra strength 4. PATTERN: Is the pain constant? (e.g., yes, no; constant, intermittent)      constant 5. RADIATION: Does the pain shoot into your legs or somewhere else?      no 6. CAUSE:  What do you think is causing the back pain?  S/p fall 9. NEUROLOGIC SYMPTOMS: Do you have any weakness, numbness, or problems with bowel/bladder control?     denies 10. OTHER SYMPTOMS: Do you have any other symptoms? (e.g., fever, abdomen pain, burning with urination, blood in urine)       Denies abd pain, fever, pain urination  Protocols used: Back Pain-A-AH

## 2024-09-03 ENCOUNTER — Ambulatory Visit: Payer: Self-pay

## 2024-09-03 NOTE — Telephone Encounter (Signed)
 FYI Only or Action Required?: Action required by provider: request for appointment.  Patient was last seen in primary care on 07/25/2024 by [name] BRAVO, MD.  Called Nurse Triage reporting Rectal Pain.  Symptoms began several days ago.  Interventions attempted: Rest, hydration, or home remedies.  Symptoms are: gradually worsening.  Triage Disposition: See PCP When Office is Open (Within 3 Days)  Patient/caregiver understands and will follow disposition?: Yes, will follow disposition  Copied from CRM #[id number]. Topic: Clinical - Red Word Triage >> Sep 03, 2024 10:20 AM Rosina BIRCH wrote: Red Word that prompted transfer to Nurse Triage: patient had a fall a week ago and he went to the emergency room and there were no fractures or broken bones. Patient is complaining about his bottom. His skin is hurting and it is tender and sore Reason for Disposition  [1] MODERATE back pain (e.g., interferes with normal activities) AND [2] present > 3 days  Answer Assessment - Initial Assessment Questions 1. ONSET: When did the pain begin? (e.g., minutes, hours, days)     After fall, pt has not been moving, pt has been sitting day/night, only getting up to use restroom. Pt sits in same position.  2. LOCATION: Where does it hurt? (upper, mid or lower back)     Buttocks/low back 3. SEVERITY: How bad is the pain?  (e.g., Scale 1-10; mild, moderate, or severe)     8 4. PATTERN: Is the pain constant? (e.g., yes, no; constant, intermittent)      constant 5. RADIATION: Does the pain shoot into your legs or somewhere else?     Stays in back 6. CAUSE:  What do you think is causing the back pain?      Pt sits in same position day and night, only gets up to go to the bathroom 7. BACK OVERUSE:  Any recent lifting of heavy objects, strenuous work or exercise?     Had a fall, states back has not been well since fall. Was  seen in ED and was told no fxs 9. NEUROLOGIC SYMPTOMS: Do you have any  weakness, numbness, or problems with bowel/bladder control?     Still walking,  10. OTHER SYMPTOMS: Do you have any other symptoms? (e.g., fever, abdomen pain, burning with urination, blood in urine)       denies  Protocols used: Back Pain-A-AH

## 2024-09-03 NOTE — Telephone Encounter (Signed)
 Please schedule pt appt.  Sent to PCP as RICK

## 2024-09-05 ENCOUNTER — Encounter (HOSPITAL_COMMUNITY)

## 2024-09-05 DIAGNOSIS — M5451 Vertebrogenic low back pain: Secondary | ICD-10-CM | POA: Diagnosis not present

## 2024-09-05 DIAGNOSIS — M7918 Myalgia, other site: Secondary | ICD-10-CM | POA: Diagnosis not present

## 2024-09-05 DIAGNOSIS — M5416 Radiculopathy, lumbar region: Secondary | ICD-10-CM | POA: Diagnosis not present

## 2024-09-08 ENCOUNTER — Other Ambulatory Visit: Payer: Self-pay | Admitting: Family Medicine

## 2024-09-10 ENCOUNTER — Encounter (INDEPENDENT_AMBULATORY_CARE_PROVIDER_SITE_OTHER): Admitting: Ophthalmology

## 2024-09-10 DIAGNOSIS — H35371 Puckering of macula, right eye: Secondary | ICD-10-CM

## 2024-09-10 DIAGNOSIS — H35033 Hypertensive retinopathy, bilateral: Secondary | ICD-10-CM | POA: Diagnosis not present

## 2024-09-10 DIAGNOSIS — H43813 Vitreous degeneration, bilateral: Secondary | ICD-10-CM

## 2024-09-10 DIAGNOSIS — H353221 Exudative age-related macular degeneration, left eye, with active choroidal neovascularization: Secondary | ICD-10-CM | POA: Diagnosis not present

## 2024-09-10 DIAGNOSIS — H353112 Nonexudative age-related macular degeneration, right eye, intermediate dry stage: Secondary | ICD-10-CM | POA: Diagnosis not present

## 2024-09-10 DIAGNOSIS — I10 Essential (primary) hypertension: Secondary | ICD-10-CM

## 2024-09-11 ENCOUNTER — Other Ambulatory Visit: Payer: Self-pay | Admitting: Family Medicine

## 2024-09-12 ENCOUNTER — Ambulatory Visit: Attending: Cardiology | Admitting: Pharmacist

## 2024-09-12 ENCOUNTER — Encounter (HOSPITAL_COMMUNITY)

## 2024-09-12 DIAGNOSIS — I4891 Unspecified atrial fibrillation: Secondary | ICD-10-CM

## 2024-09-12 DIAGNOSIS — I4821 Permanent atrial fibrillation: Secondary | ICD-10-CM | POA: Diagnosis not present

## 2024-09-12 DIAGNOSIS — Z5181 Encounter for therapeutic drug level monitoring: Secondary | ICD-10-CM

## 2024-09-12 DIAGNOSIS — Z0000 Encounter for general adult medical examination without abnormal findings: Secondary | ICD-10-CM

## 2024-09-12 LAB — POCT INR: INR: 4.1 — AB (ref 2.0–3.0)

## 2024-09-12 NOTE — Progress Notes (Signed)
 Description   *DOES NOT NEED A PRINTOUT*  INR 4.1: Hold dose today and then continue 1 tablet daily.  Stay consistent with greens each week (3 per week).  INR in 2 weeks  Call us  with any medication changes or concerns # [phone] Coumadin  Clinic.   Allopurinol  Daily started 1/3

## 2024-09-12 NOTE — Patient Instructions (Signed)
 Description   *DOES NOT NEED A PRINTOUT*  INR 4.1: Hold dose today and then continue 1 tablet daily.  Stay consistent with greens each week (3 per week).  INR in 2 weeks  Call us  with any medication changes or concerns # [phone] Coumadin  Clinic.   Allopurinol  Daily started 1/3

## 2024-09-13 ENCOUNTER — Ambulatory Visit: Payer: Self-pay | Admitting: Family Medicine

## 2024-09-13 ENCOUNTER — Ambulatory Visit: Admitting: Family Medicine

## 2024-09-13 VITALS — BP 110/58 | HR 78 | Temp 97.9°F | Ht 68.0 in | Wt 208.0 lb

## 2024-09-13 DIAGNOSIS — R1310 Dysphagia, unspecified: Secondary | ICD-10-CM

## 2024-09-13 DIAGNOSIS — R443 Hallucinations, unspecified: Secondary | ICD-10-CM

## 2024-09-13 DIAGNOSIS — R42 Dizziness and giddiness: Secondary | ICD-10-CM | POA: Diagnosis not present

## 2024-09-13 DIAGNOSIS — L89152 Pressure ulcer of sacral region, stage 2: Secondary | ICD-10-CM | POA: Diagnosis not present

## 2024-09-13 LAB — BASIC METABOLIC PANEL WITH GFR
BUN: 22 mg/dL (ref 6–23)
CO2: 31 meq/L (ref 19–32)
Calcium: 9.3 mg/dL (ref 8.4–10.5)
Chloride: 93 meq/L — ABNORMAL LOW (ref 96–112)
Creatinine, Ser: 1.13 mg/dL (ref 0.40–1.50)
GFR: 57.92 mL/min — ABNORMAL LOW (ref >60.00)
Glucose, Bld: 114 mg/dL — ABNORMAL HIGH (ref 70–99)
Potassium: 4.1 meq/L (ref 3.5–5.1)
Sodium: 135 meq/L (ref 135–145)

## 2024-09-13 LAB — CBC WITH DIFFERENTIAL/PLATELET
Basophils Absolute: 0.1 K/uL (ref 0.0–0.1)
Basophils Relative: 0.9 % (ref 0.0–3.0)
Eosinophils Absolute: 0.3 K/uL (ref 0.0–0.7)
Eosinophils Relative: 4.4 % (ref 0.0–5.0)
HCT: 32.5 % — ABNORMAL LOW (ref 39.0–52.0)
Hemoglobin: 11 g/dL — ABNORMAL LOW (ref 13.0–17.0)
Lymphocytes Relative: 18.9 % (ref 12.0–46.0)
Lymphs Abs: 1.3 K/uL (ref 0.7–4.0)
MCHC: 33.9 g/dL (ref 30.0–36.0)
MCV: 93.1 fl (ref 78.0–100.0)
Monocytes Absolute: 0.8 K/uL (ref 0.1–1.0)
Monocytes Relative: 11.2 % (ref 3.0–12.0)
Neutro Abs: 4.6 K/uL (ref 1.4–7.7)
Neutrophils Relative %: 64.6 % (ref 43.0–77.0)
Platelets: 175 K/uL (ref 150.0–400.0)
RBC: 3.49 Mil/uL — ABNORMAL LOW (ref 4.22–5.81)
RDW: 15.9 % — ABNORMAL HIGH (ref 11.5–15.5)
WBC: 7.1 K/uL (ref 4.0–10.5)

## 2024-09-13 LAB — AMMONIA: Ammonia: 37 umol/L — ABNORMAL HIGH (ref 11–35)

## 2024-09-13 LAB — HEPATIC FUNCTION PANEL
ALT: 22 U/L (ref 0–53)
AST: 22 U/L (ref 0–37)
Albumin: 3.9 g/dL (ref 3.5–5.2)
Alkaline Phosphatase: 102 U/L (ref 39–117)
Bilirubin, Direct: 0.3 mg/dL (ref 0.0–0.3)
Total Bilirubin: 1 mg/dL (ref 0.2–1.2)
Total Protein: 7.1 g/dL (ref 6.0–8.3)

## 2024-09-13 LAB — TSH: TSH: 4.07 u[IU]/mL (ref 0.35–5.50)

## 2024-09-13 MED ORDER — MUPIROCIN 2 % EX OINT: 1.0000 | TOPICAL_OINTMENT | Freq: Two times a day (BID) | CUTANEOUS | 1 refills | Status: AC

## 2024-09-13 NOTE — Patient Instructions (Signed)
 Follow up in 2-3 weeks to recheck blood pressure and light headedness We'll notify you of your lab results and make any changes if needed STOP the Amlodipine  USE the Mupirocin  ointment 1-2x/day on the sores and keep the area clean and dry Make sure you are eating and drinking regularly We'll call you to schedule your GI appt Call with any questions or concerns Hang in there!! Merry Christmas!!!

## 2024-09-13 NOTE — Progress Notes (Signed)
 Subjective:    Patient ID: Marvin Foster, male    DOB: 01/03/1934, 89 y.o.   MRN: [id number]  HPI ER f/u- pt went to ER on 11/18 after falling at home.  Thankfully he did not hit his head or lose consciousness- landed on backside.  He was having R sided back and buttock pain and having trouble getting out of bed.  CT was unremarkable w/ exception of large stool burden.  At the time, pt could not remember when he had last BM.  Today, pt reports he has been hallucinating since his heart procedure in September.  Denies being scared or distressed by hallucinations.  Has not been able to live at home independently which he was doing prior to surgery.  He has been splitting time between daughter and son.    Daughter reports 2 pressure sores- 1 on each buttock.  Has been washing w/ antibacterial soap and covering w/ vaseline.  1 wound is much improved, 1 is slower to heal.  He has been struggling w/ feeling lightheaded- typically w/ position changes.  Having dysphagia since surgery in September.  Was to f/u w/ GI but has not.  Constipation has resolved w/ OTC Miralax  and Dulcolax.   Review of Systems For ROS see HPI     Objective:   Physical Exam Vitals reviewed.  Constitutional:      General: He is not in acute distress.    Appearance: Normal appearance. He is well-developed. He is not ill-appearing.  HENT:     Head: Normocephalic and atraumatic.  Eyes:     Extraocular Movements: Extraocular movements intact.     Conjunctiva/sclera: Conjunctivae normal.     Pupils: Pupils are equal, round, and reactive to light.  Neck:     Thyroid : No thyromegaly.  Cardiovascular:     Rate and Rhythm: Normal rate and regular rhythm.     Pulses: Normal pulses.     Heart sounds: Normal heart sounds. No murmur heard. Pulmonary:     Effort: Pulmonary effort is normal. No respiratory distress.     Breath sounds: Normal breath sounds.  Abdominal:     General: Bowel sounds are normal. There is no  distension.     Palpations: Abdomen is soft.  Musculoskeletal:     Cervical back: Normal range of motion and neck supple.     Right lower leg: No edema.     Left lower leg: No edema.  Lymphadenopathy:     Cervical: No cervical adenopathy.  Skin:    General: Skin is warm and dry.  Neurological:     General: No focal deficit present.     Mental Status: He is alert and oriented to person, place, and time.     Cranial Nerves: No cranial nerve deficit.  Psychiatric:        Mood and Affect: Mood normal.        Behavior: Behavior normal.           Assessment & Plan:  Lightheaded- new.  I suspect his low BP is contributing.  Will hold Amlodipine  and see if sxs improve.  Will also check labs to assess for electrolyte disturbance or anemia or other underlying cause.  If no improvement after holding Amlodipine , will consider holding Flomax .  Pt and family expressed agreement and understanding.  Hallucinations- new.  Family reports this has been ongoing since he had surgery in September.  They are less frequent and not distressing to pt, but still occurring.  Suspect  the anesthesia impacted him cognitively, but will check labs to assess for possible underlying cause- including Ammonia level.  Pressure sores- new.  Per report, Stage 2.  Not visualized in office today at pt's request.  Daughter feels 1 side is nearly healed- scab in place- and other side is doing much better but slower to heal.  Will start Mupirocin  topically.  Encouraged him to change positions as much as possible.  Dysphagia- ongoing since surgery.  Was supposed to f/u w/ GI but did not.  Referral placed.

## 2024-09-16 ENCOUNTER — Encounter: Payer: Self-pay | Admitting: Family Medicine

## 2024-09-17 ENCOUNTER — Encounter (HOSPITAL_COMMUNITY)

## 2024-09-19 ENCOUNTER — Telehealth: Payer: Self-pay | Admitting: Family Medicine

## 2024-09-19 ENCOUNTER — Encounter (HOSPITAL_COMMUNITY)

## 2024-09-19 NOTE — Telephone Encounter (Signed)
 Placed in providers folder at nurse station

## 2024-09-19 NOTE — Telephone Encounter (Signed)
 Type of form received: Chronic Condition Verification  Additional comments:   Received by: Fax  Form should be Faxed/mailed to: (address/ fax #) [phone]  Is patient requesting call for pickup: N/A  Form placed:  Labeled & placed in provider bin  Attach charge sheet.  Provider will determine charge.  Individual made aware of 3-5 business day turn around? N/A

## 2024-09-20 NOTE — Telephone Encounter (Signed)
 Faxed back to the requested number

## 2024-09-20 NOTE — Telephone Encounter (Signed)
Forms completed and returned to [facility]

## 2024-09-21 ENCOUNTER — Other Ambulatory Visit: Payer: Self-pay | Admitting: Family Medicine

## 2024-09-24 ENCOUNTER — Encounter (HOSPITAL_COMMUNITY)

## 2024-09-26 ENCOUNTER — Encounter (HOSPITAL_COMMUNITY)

## 2024-09-27 ENCOUNTER — Ambulatory Visit: Attending: Cardiology

## 2024-09-27 DIAGNOSIS — I4821 Permanent atrial fibrillation: Secondary | ICD-10-CM | POA: Diagnosis not present

## 2024-09-27 DIAGNOSIS — I4891 Unspecified atrial fibrillation: Secondary | ICD-10-CM

## 2024-09-27 DIAGNOSIS — Z5181 Encounter for therapeutic drug level monitoring: Secondary | ICD-10-CM

## 2024-09-27 DIAGNOSIS — Z7901 Long term (current) use of anticoagulants: Secondary | ICD-10-CM

## 2024-09-27 DIAGNOSIS — Z952 Presence of prosthetic heart valve: Secondary | ICD-10-CM

## 2024-09-27 LAB — POCT INR: INR: 3.3 — AB (ref 2.0–3.0)

## 2024-09-27 NOTE — Progress Notes (Signed)
 INR 3.3 Please see anticoagulation encounter *DOES NOT NEED A PRINTOUT*  Hold tonight only then continue 1 tablet daily.  Stay consistent with greens each week (3 per week).  INR in 3 weeks  Call us  with any medication changes or concerns # [phone] Coumadin  Clinic.   Allopurinol  Daily started 1/3

## 2024-09-27 NOTE — Patient Instructions (Signed)
*  DOES NOT NEED A PRINTOUT*  Hold tonight only then continue 1 tablet daily.  Stay consistent with greens each week (3 per week).  INR in 3 weeks  Call us  with any medication changes or concerns # [phone] Coumadin  Clinic.   Allopurinol  Daily started 1/3

## 2024-09-28 ENCOUNTER — Ambulatory Visit: Admitting: Family Medicine

## 2024-09-28 ENCOUNTER — Encounter: Payer: Self-pay | Admitting: Family Medicine

## 2024-09-28 VITALS — BP 130/54 | HR 69 | Temp 100.3°F | Ht 68.0 in | Wt 202.4 lb

## 2024-09-28 DIAGNOSIS — I4821 Permanent atrial fibrillation: Secondary | ICD-10-CM | POA: Diagnosis not present

## 2024-09-28 DIAGNOSIS — I10 Essential (primary) hypertension: Secondary | ICD-10-CM | POA: Diagnosis not present

## 2024-09-28 NOTE — Progress Notes (Unsigned)
" ° °  Subjective:    Patient ID: Marvin Foster, male    DOB: [birth year], 89 y.o.   MRN: [id number]  HPI HTN- at last visit we stopped amlodipine  due to low BP and dizziness.  Dizziness is improved but not resolved. Confusion and hallucinations are better but not gone.  Also on Metoprolol  100mg  BID, lasix  40mg  daily.  Denies CP, SOB, HA's, edema.  Afib- daughter reports difficulty getting INR in range b/c pt is not eating like he used.  INR has been high and they are encouraging increased intake of greens but pt is having a hard time eating/swallowing since surgery.  Review of Systems For ROS see HPI     Objective:   Physical Exam Vitals reviewed.  Constitutional:      General: He is not in acute distress.    Appearance: Normal appearance. He is well-developed. He is not ill-appearing.  HENT:     Head: Normocephalic and atraumatic.  Eyes:     Extraocular Movements: Extraocular movements intact.     Conjunctiva/sclera: Conjunctivae normal.     Pupils: Pupils are equal, round, and reactive to light.  Neck:     Thyroid : No thyromegaly.  Cardiovascular:     Rate and Rhythm: Normal rate and regular rhythm.     Pulses: Normal pulses.     Heart sounds: No murmur heard. Pulmonary:     Effort: Pulmonary effort is normal. No respiratory distress.     Breath sounds: Normal breath sounds.  Abdominal:     General: Bowel sounds are normal. There is no distension.     Palpations: Abdomen is soft.  Musculoskeletal:     Cervical back: Normal range of motion and neck supple.     Right lower leg: No edema.     Left lower leg: No edema.  Lymphadenopathy:     Cervical: No cervical adenopathy.  Skin:    General: Skin is warm and dry.  Neurological:     General: No focal deficit present.     Mental Status: He is alert and oriented to person, place, and time.     Cranial Nerves: No cranial nerve deficit.  Psychiatric:        Mood and Affect: Mood normal.        Behavior: Behavior normal.            Assessment & Plan:    "

## 2024-09-28 NOTE — Patient Instructions (Signed)
 Follow up in 2 months to recheck sugar, blood pressure No need for labs today- great news! CHANGE the furosemide  (Lasix ) to as needed for swelling Encourage the Coumadin  clinic to adjust the meds rather than counting on adjusting the diet Call with any questions or concerns Stay Safe!  Stay Healthy! HAPPY HOLIDAYS!!!

## 2024-09-30 NOTE — Assessment & Plan Note (Signed)
 Chronic problem.  Last visit we stopped Amlodipine  due to low BP and dizziness.  Dizziness has definitely improved but not resolved.  Confusion and hallucinations are also improving.  Currently asymptomatic.  Since he last lost weight and is no longer struggling w/ edema, will decrease Lasix  to PRN.  Pt expressed understanding and is in agreement w/ plan.

## 2024-09-30 NOTE — Assessment & Plan Note (Signed)
 Daughter reports difficulty getting his INR in range.  Apparently coumadin  clinic has been encouraging them to change his diet rather than adjusting his medication.  Pt is having a hard time eating in general and is not consuming leafy greens at this time.  Encouraged family to have Coumadin  clinic adjust medication rather than his diet for the time being.  Will follow.

## 2024-10-01 ENCOUNTER — Encounter (HOSPITAL_COMMUNITY)

## 2024-10-03 ENCOUNTER — Encounter (HOSPITAL_COMMUNITY)

## 2024-10-07 NOTE — Progress Notes (Unsigned)
" °  Electrophysiology Office Note:   ID:  Marvin Foster, Marvin Foster [birth year], MRN [id number]  Primary Cardiologist: Oneil Parchment, MD Electrophysiologist: OLE ONEIDA HOLTS, MD  {Click to update primary MD,subspecialty MD or APP then REFRESH:1}    History of Present Illness:   Marvin Foster is a 89 y.o. male with h/o RBBB, Severe AS s/p TAVR, DM2, HTN, Permanent AF, and CAD seen today for routine electrophysiology follow-up s/p Pacemaker implant.  Since last being seen in our clinic the patient reports doing ***.  he denies chest pain, palpitations, dyspnea, PND, orthopnea, nausea, vomiting, dizziness, syncope, edema, weight gain, or early satiety.    Review of systems complete and found to be negative unless listed in HPI.   EP Information / Studies Reviewed:    EKG is ordered today. Personal review as below.       PPM Interrogation-  reviewed in detail today,  See PACEART report.  Arrhythmia/Device History PPM Single chamber BSX CL    Physical Exam:   VS:  There were no vitals taken for this visit.   Wt Readings from Last 3 Encounters:  09/28/24 202 lb 6.4 oz (91.8 kg)  09/13/24 208 lb (94.3 kg)  08/28/24 208 lb 1.8 oz (94.4 kg)     GEN: No acute distress  NECK: No JVD; No carotid bruits CARDIAC: {EPRHYTHM:28826}, no murmurs, rubs, gallops RESPIRATORY:  Clear to auscultation without rales, wheezing or rhonchi  ABDOMEN: Soft, non-tender, non-distended EXTREMITIES:  {EDEMA LEVEL:28147::No} edema; No deformity   ASSESSMENT AND PLAN:    CHB s/p [employer] PPM  Normal PPM function See Elisabeth Art report No changes today  Permanent AF Secondary hypercoagulable state On coumadin  chronically for CHA2DS2/VASc of at least 5  Severe AS s/p TAVR Stable post op Echo.     {Click here to Review PMH, Prob List, Meds, Allergies, SHx, FHx  :1}   Disposition:   Follow up with {EPPROVIDERS:28135::EP Team} {EPFOLLOW UP:28173}  Signed, Ozell Prentice Passey, PA-C  "

## 2024-10-08 ENCOUNTER — Encounter: Payer: Self-pay | Admitting: Student

## 2024-10-08 ENCOUNTER — Ambulatory Visit: Attending: Student | Admitting: Student

## 2024-10-08 ENCOUNTER — Encounter (HOSPITAL_COMMUNITY)

## 2024-10-08 VITALS — BP 118/64 | HR 60 | Ht 69.0 in | Wt 202.0 lb

## 2024-10-08 DIAGNOSIS — I442 Atrioventricular block, complete: Secondary | ICD-10-CM

## 2024-10-08 DIAGNOSIS — I4821 Permanent atrial fibrillation: Secondary | ICD-10-CM | POA: Diagnosis not present

## 2024-10-08 DIAGNOSIS — Z7901 Long term (current) use of anticoagulants: Secondary | ICD-10-CM | POA: Diagnosis not present

## 2024-10-08 DIAGNOSIS — Z952 Presence of prosthetic heart valve: Secondary | ICD-10-CM | POA: Diagnosis not present

## 2024-10-08 LAB — CUP PACEART INCLINIC DEVICE CHECK
Date Time Interrogation Session: 20251229102617
Implantable Lead Connection Status: 753985
Implantable Lead Implant Date: 20250924
Implantable Lead Location: 753860
Implantable Lead Model: 7842
Implantable Lead Serial Number: 1510463
Implantable Pulse Generator Implant Date: 20250924
Lead Channel Impedance Value: 641 Ohm
Lead Channel Pacing Threshold Amplitude: 0.7 V
Lead Channel Pacing Threshold Pulse Width: 0.4 ms
Lead Channel Setting Pacing Amplitude: 2.5 V
Lead Channel Setting Pacing Pulse Width: 0.4 ms
Lead Channel Setting Sensing Sensitivity: 3.5 mV
Pulse Gen Serial Number: 955381
Zone Setting Status: 755011

## 2024-10-08 NOTE — Patient Instructions (Signed)
 Medication Instructions:  No medication changes today. *If you need a refill on your cardiac medications before your next appointment, please call your pharmacy*  Lab Work: No labwork ordered today. If you have labs (blood work) drawn today and your tests are completely normal, you will receive your results only by: MyChart Message (if you have MyChart) OR A paper copy in the mail If you have any lab test that is abnormal or we need to change your treatment, we will call you to review the results.  Testing/Procedures: No testing ordered today  Follow-Up: At [facility], you and your health needs are our priority.  As part of our continuing mission to provide you with exceptional heart care, our providers are all part of one team.  This team includes your primary Cardiologist (physician) and Advanced Practice Providers or APPs (Physician Assistants and Nurse Practitioners) who all work together to provide you with the care you need, when you need it.  Your next appointment:   3 month(s)  Provider:   Joycelyn Noa" Percell Boyers, PA-C  We recommend signing up for the patient portal called "MyChart".  Sign up information is provided on this After Visit Summary.  MyChart is used to connect with patients for Virtual Visits (Telemedicine).  Patients are able to view lab/test results, encounter notes, upcoming appointments, etc.  Non-urgent messages can be sent to your provider as well.   To learn more about what you can do with MyChart, go to [email or web address].

## 2024-10-10 ENCOUNTER — Encounter (HOSPITAL_COMMUNITY)

## 2024-10-10 ENCOUNTER — Other Ambulatory Visit: Payer: Self-pay | Admitting: Family Medicine

## 2024-10-10 ENCOUNTER — Ambulatory Visit: Payer: Self-pay | Admitting: Cardiovascular Disease

## 2024-10-12 ENCOUNTER — Other Ambulatory Visit (HOSPITAL_COMMUNITY): Payer: Self-pay | Admitting: Family Medicine

## 2024-10-12 DIAGNOSIS — M5459 Other low back pain: Secondary | ICD-10-CM

## 2024-10-14 ENCOUNTER — Encounter: Payer: Self-pay | Admitting: Cardiovascular Disease

## 2024-10-15 ENCOUNTER — Encounter (HOSPITAL_COMMUNITY): Payer: Self-pay

## 2024-10-15 ENCOUNTER — Encounter (HOSPITAL_COMMUNITY)

## 2024-10-17 ENCOUNTER — Encounter (HOSPITAL_COMMUNITY): Payer: Self-pay

## 2024-10-17 ENCOUNTER — Encounter (HOSPITAL_COMMUNITY)

## 2024-10-17 NOTE — Progress Notes (Signed)
 " [facility]   MULTIDISCIPLINARY HEART VALVE CLINIC                                     Cardiology Office Note:    Date:  10/19/2024   ID:  [name], DOB 11/11/1933, MRN [id number]  PCP:  [name] BRAVO, MD  [facility] Cardiologist:  Oneil Parchment, MD  [facility] Structural heart: Lonni Cash, MD [facility] Electrophysiologist:  Eulas BRAVO Furbish, MD   Referring MD: Tabori, Katherine E, MD   Add on for cognitive issues after TAVR.   History of Present Illness:    Marvin Foster is a 89 y.o. 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    He was followed by Dr. Cash in the valve clinic since at least 2024 for severe asymptomatic LFLG AS. Echo 12/19/23 with LVEF 60-65%, mild to moderate MR and LFLG severe AS with mean gradient 25 mmHg, AVA 0.58 cm2, DI 0.2, SVI 26. LHC 05/16/24 showed mild non obst CAD. S/p successful TAVR with a 26 mm Edwards Sapien 3 Ultra Resilia THV via the TF approach on 07/03/24. Post operative echo showed EF 70%, normally functioning TAVR with a mean gradient of 8 mmHg and no PVL. He developed post op CHB s/p single chamber permanent pacemaker with left bundle area lead by Dr. Cindie on 07/04/24. Coumadin  held for 5 days. He developed fevers and chills and CXR suspicious for PNA. Discharged on Ceftin  500mg  BID.   Daughter Mycharted our office with concerns about light headedness, confusion, somnolence, hallucinations, anorexia w/ weight loss and falling since TAVR. Seen by PCP 09/13/24 and BP 110/78. Amlodipine  discontinued. Labs including ammonia were stable with no cause found for hallucinations or other symptoms. 1 month echo 08/09/24 showed EF 60%, mild MR, normally functioning TAVR with a mean gradient of 6 mm hg and no PVL.   Today the  patient presents to clinic for evaluation. His son and daughter Lourdes and [name]) are here. [name] tells me he wakes up every morning and says his get up and go has gone up and went. He has had a couple falls that have made him unstable for cardiac rehab. He had significant back pain. Was going to get a spinal injection but insurance wouldn't approve it until he got an MRI which Is scheduled in the near future. He is having new hallucinations like opening candy that isn't there, waiting for water  to take pills that aren't there and seeing kids passing gifts around.  He sees a lady in blue frequently. He admits to knowing he sees things that aren't there. He also sleeps a lot of the day and is very sedentary. He has not had any appetite and lost significant weight. Has cut back on lasix . Swelling has improved a lot since TAVR. No chest pain or SOB but not doing any activity. His wife was feeling badly and get beta blocker was taken off and she felt better.    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 Verlin and Dr. Shyrl   Scrotal mass 03/07/2012   Spinal stenosis 01/20/2005   Tubular adenoma of colon      Current Medications: Current Meds  Medication Sig   [DISCONTINUED] furosemide  (LASIX ) 40 MG tablet Take 1 tablet by mouth once daily (Patient taking differently: Take 40 mg by mouth daily. Taking PRN, has not needed and currently losing weight)      ROS:   Please see the history of present illness.    All other systems reviewed and are negative.  EKGs       Risk Assessment/Calculations:    CHA2DS2-VASc Score =     This indicates a  % annual risk of stroke. The patient's score is based upon:           Physical Exam:    VS:  BP 128/62   Pulse 60   Ht 5' 9 (1.753 m)   Wt 197 lb 9.6 oz (89.6 kg)   SpO2 96%   BMI 29.18 kg/m     Wt Readings from Last 3 Encounters:  10/18/24 197 lb 9.6 oz (89.6 kg)  10/08/24 202 lb (91.6 kg)  09/28/24 202 lb 6.4 oz (91.8 kg)     GEN: Well nourished, well developed in no acute distress NECK: No JVD CARDIAC: RRR, soft flow murmur @ RUSB. No rubs, gallops RESPIRATORY:  Clear to auscultation without rales, wheezing or rhonchi  ABDOMEN: Soft, non-tender, non-distended EXTREMITIES:  No edema; No deformity.   ASSESSMENT:    1. Cognitive change w/ hallucinations   2. Other fatigue   3. S/P TAVR (transcatheter aortic valve replacement)   4. S/P placement of cardiac pacemaker   5. Permanent atrial fibrillation (HCC)   6. Coronary artery disease involving native coronary artery of native heart without angina pectoris   7. Essential hypertension     PLAN:    In order of problems listed above:  Cognitive changes w/ hallucinations: -- Pt and family complaining of light headedness, confusion, somnolence, hallucinations, anorexia w/ weight loss  and falling since TAVR/PPM. Seen by PCP 09/13/24 and amlodipine  discontinued. Labs including ammonia were stable with no obvious cause found for hallucinations or other symptoms. -- Discussed how we sometimes see changes in cognitive function after TAVR and anesthesia. Risk factors include advanced age and preexisting cognitive impairment. There can be silent cerebral infarcts associated with cognitive changes and an increased risk of  subsequent dementia. The pt did have acute delirium after anesthesia which further increases risk of not returning to baseline cognitive status. -- Treatment is supportive with encouragement to be as active mentally and physically as possible. -- Will refer to Neuro for evaluation.   Fatigue/deconditioning: -- With ongoing profound fatigue and lightheadedness, will taper of BB. Lopressor  100mg  BID will be tapered down to 25mg  BID.  -- Lasix  changed to PRN today as he has had a marked improvement in swelling since TAVR and with significant weight loss.  -- He is unable to participate in CRP due to back pain requiring a spinal injection (hopefully approved after MRI) and frequent falls. -- I have encouraged him to be as active mentally and physically as possible within his physical limitations.  Severe AS s/p TAVR:  -- Stable by echo in October 2024.  S/p PPM: -- With CHB after TAVR.  -- S/p PPM with a single chamber permanent pacemaker with left bundle area lead by Dr. Cindie on 07/04/24.   Persistent atrial fibrillation: -- As above, decrease Lopressor  100mg  BID-> 25mg  BID (will taper). -- Continue Coumadin    CAD: -- S/p LAD stent in 2006 -- LHC 05/16/24 showed mild non obst CAD.  -- Continue medical therapy.  -- Continue atorvastatin  40mg  daily.  -- No aspirin  givne OAC.   HTN: -- BP well controlled today. -- Tapering off BP meds due to lightheadedness and fatigue. Okay to let run a little higher.   Medication Adjustments/Labs and Tests Ordered: Current  medicines are reviewed at length with the patient today.  Concerns regarding medicines are outlined above.  Orders Placed This Encounter  Procedures   Ambulatory referral to Neurology   ECHOCARDIOGRAM COMPLETE   Meds ordered this encounter  Medications   furosemide  (LASIX ) 40 MG tablet    Sig: Take 1 tablet (40 mg total) by mouth as needed.   metoprolol  tartrate (LOPRESSOR ) 25 MG tablet    Sig: Take 2 tablets (50 mg total) by mouth 2 (two) times daily.    Dispense:  360 tablet    Refill:  3    Patient Instructions  Medication Instructions:  Your physician has recommended you make the following change in your medication:  CHANGE Lasix  to take only as needed. DECREASE Lopressor  (metoprolol  tartrate) to taking TWO 25mg  tablets twice daily, with the ultimate goal to taper down to ONE 25mg  tablet twice daily.   *If you need a refill on your cardiac medications before your next appointment, please call your pharmacy*  Lab Work: None needed If you have labs (blood work) drawn today and your tests are completely normal, you will receive your results only by: MyChart Message (if you have MyChart) OR A paper copy in the mail If you have any lab test that is abnormal or we need to change your treatment, we will call you to review the results.  Testing/Procedures: 07/08/2025 Your physician has requested that you have an echocardiogram. Echocardiography is a painless test that uses sound waves to create images of your heart. It provides your doctor with information about the size and shape of your heart and how well your hearts chambers and valves are working. This procedure takes approximately one hour. There are no restrictions for this procedure. Please do NOT wear cologne, perfume, aftershave, or lotions (deodorant is allowed). Please arrive 15 minutes prior to your appointment time.  Please note: We ask at that you not bring children with you during ultrasound (echo/ vascular) testing. Due  to  room size and safety concerns, children are not allowed in the ultrasound rooms during exams. Our front office staff cannot provide observation of children in our lobby area while testing is being conducted. An adult accompanying a patient to their appointment will only be allowed in the ultrasound room at the discretion of the ultrasound technician under special circumstances. We apologize for any inconvenience.   Follow-Up: At [facility], you and your health needs are our priority.  As part of our continuing mission to provide you with exceptional heart care, our providers are all part of one team.  This team includes your primary Cardiologist (physician) and Advanced Practice Providers or APPs (Physician Assistants and Nurse Practitioners) who all work together to provide you with the care you need, when you need it.  Your next appointment:   As scheduled on 11/20/24  Provider:   Oneil Parchment, MD    We recommend signing up for the patient portal called MyChart.  Sign up information is provided on this After Visit Summary.  MyChart is used to connect with patients for Virtual Visits (Telemedicine).  Patients are able to view lab/test results, encounter notes, upcoming appointments, etc.  Non-urgent messages can be sent to your provider as well.   To learn more about what you can do with MyChart, go to [email or web address].   Other Instructions You have been referred to Neurology. Their team will reach out to schedule an appointment in the next week.            Signed, Lamarr Hummer, PA-C  10/19/2024 10:33 AM     [facility] "

## 2024-10-18 ENCOUNTER — Ambulatory Visit (INDEPENDENT_AMBULATORY_CARE_PROVIDER_SITE_OTHER): Payer: Self-pay | Admitting: Physician Assistant

## 2024-10-18 ENCOUNTER — Ambulatory Visit: Payer: Self-pay | Attending: Cardiology

## 2024-10-18 VITALS — BP 128/62 | HR 60 | Ht 69.0 in | Wt 197.6 lb

## 2024-10-18 DIAGNOSIS — Z5181 Encounter for therapeutic drug level monitoring: Secondary | ICD-10-CM

## 2024-10-18 DIAGNOSIS — R4189 Other symptoms and signs involving cognitive functions and awareness: Secondary | ICD-10-CM | POA: Diagnosis not present

## 2024-10-18 DIAGNOSIS — I4891 Unspecified atrial fibrillation: Secondary | ICD-10-CM

## 2024-10-18 DIAGNOSIS — I10 Essential (primary) hypertension: Secondary | ICD-10-CM | POA: Diagnosis not present

## 2024-10-18 DIAGNOSIS — Z952 Presence of prosthetic heart valve: Secondary | ICD-10-CM

## 2024-10-18 DIAGNOSIS — R5383 Other fatigue: Secondary | ICD-10-CM

## 2024-10-18 DIAGNOSIS — I4821 Permanent atrial fibrillation: Secondary | ICD-10-CM

## 2024-10-18 DIAGNOSIS — Z950 Presence of cardiac pacemaker: Secondary | ICD-10-CM

## 2024-10-18 DIAGNOSIS — I2510 Atherosclerotic heart disease of native coronary artery without angina pectoris: Secondary | ICD-10-CM | POA: Diagnosis not present

## 2024-10-18 DIAGNOSIS — Z7901 Long term (current) use of anticoagulants: Secondary | ICD-10-CM

## 2024-10-18 LAB — POCT INR: INR: 3.9 — AB (ref 2.0–3.0)

## 2024-10-18 MED ORDER — FUROSEMIDE 40 MG PO TABS: 40.0000 mg | ORAL_TABLET | ORAL | Status: AC | PRN

## 2024-10-18 MED ORDER — METOPROLOL TARTRATE 25 MG PO TABS: 50.0000 mg | ORAL_TABLET | Freq: Two times a day (BID) | ORAL | 3 refills | Status: AC

## 2024-10-18 NOTE — Patient Instructions (Signed)
 Medication Instructions:  Your physician has recommended you make the following change in your medication:  CHANGE Lasix  to take only as needed. DECREASE Lopressor  (metoprolol  tartrate) to taking TWO 25mg  tablets twice daily, with the ultimate goal to taper down to ONE 25mg  tablet twice daily.   *If you need a refill on your cardiac medications before your next appointment, please call your pharmacy*  Lab Work: None needed If you have labs (blood work) drawn today and your tests are completely normal, you will receive your results only by: MyChart Message (if you have MyChart) OR A paper copy in the mail If you have any lab test that is abnormal or we need to change your treatment, we will call you to review the results.  Testing/Procedures: 07/08/2025 Your physician has requested that you have an echocardiogram. Echocardiography is a painless test that uses sound waves to create images of your heart. It provides your doctor with information about the size and shape of your heart and how well your hearts chambers and valves are working. This procedure takes approximately one hour. There are no restrictions for this procedure. Please do NOT wear cologne, perfume, aftershave, or lotions (deodorant is allowed). Please arrive 15 minutes prior to your appointment time.  Please note: We ask at that you not bring children with you during ultrasound (echo/ vascular) testing. Due to room size and safety concerns, children are not allowed in the ultrasound rooms during exams. Our front office staff cannot provide observation of children in our lobby area while testing is being conducted. An adult accompanying a patient to their appointment will only be allowed in the ultrasound room at the discretion of the ultrasound technician under special circumstances. We apologize for any inconvenience.   Follow-Up: At [facility], you and your health needs are our priority.  As part of our continuing  mission to provide you with exceptional heart care, our providers are all part of one team.  This team includes your primary Cardiologist (physician) and Advanced Practice Providers or APPs (Physician Assistants and Nurse Practitioners) who all work together to provide you with the care you need, when you need it.  Your next appointment:   As scheduled on 11/20/24  Provider:   Oneil Parchment, MD    We recommend signing up for the patient portal called MyChart.  Sign up information is provided on this After Visit Summary.  MyChart is used to connect with patients for Virtual Visits (Telemedicine).  Patients are able to view lab/test results, encounter notes, upcoming appointments, etc.  Non-urgent messages can be sent to your provider as well.   To learn more about what you can do with MyChart, go to [email or web address].   Other Instructions You have been referred to Neurology. Their team will reach out to schedule an appointment in the next week.

## 2024-10-18 NOTE — Progress Notes (Signed)
"   INR 3.9  Please see anticoagulation encounter *DOES NOT NEED A PRINTOUT*  Hold tonight only then Decrease to 1 tablet daily, except 0.5 tablet every Monday and Friday. INR in 3 weeks  "

## 2024-10-18 NOTE — Patient Instructions (Signed)
*  DOES NOT NEED A PRINTOUT*  Hold tonight only then Decrease to 1 tablet daily, except 0.5 tablet every Monday and Friday. INR in 3 weeks  Call us  with any medication changes or concerns # [phone] Coumadin  Clinic.   Allopurinol  Daily started 1/3

## 2024-10-22 ENCOUNTER — Encounter (HOSPITAL_COMMUNITY): Payer: Self-pay

## 2024-10-22 ENCOUNTER — Encounter (HOSPITAL_COMMUNITY)

## 2024-10-24 ENCOUNTER — Encounter (HOSPITAL_COMMUNITY): Payer: Self-pay

## 2024-10-24 ENCOUNTER — Encounter (HOSPITAL_COMMUNITY)

## 2024-10-25 ENCOUNTER — Encounter: Payer: Self-pay | Admitting: Family Medicine

## 2024-10-25 ENCOUNTER — Encounter: Payer: Self-pay | Admitting: Physician Assistant

## 2024-10-27 ENCOUNTER — Other Ambulatory Visit: Payer: Self-pay | Admitting: Family Medicine

## 2024-10-27 ENCOUNTER — Other Ambulatory Visit: Payer: Self-pay | Admitting: Cardiology

## 2024-10-27 DIAGNOSIS — E1169 Type 2 diabetes mellitus with other specified complication: Secondary | ICD-10-CM

## 2024-10-27 DIAGNOSIS — I4821 Permanent atrial fibrillation: Secondary | ICD-10-CM

## 2024-10-27 DIAGNOSIS — Z952 Presence of prosthetic heart valve: Secondary | ICD-10-CM

## 2024-10-28 ENCOUNTER — Encounter: Payer: Self-pay | Admitting: Family Medicine

## 2024-10-28 ENCOUNTER — Other Ambulatory Visit: Payer: Self-pay | Admitting: Family Medicine

## 2024-10-29 ENCOUNTER — Encounter (HOSPITAL_COMMUNITY): Payer: Self-pay

## 2024-10-29 ENCOUNTER — Encounter (HOSPITAL_COMMUNITY)

## 2024-10-29 MED ORDER — TOLTERODINE TARTRATE 1 MG PO TABS: 1.0000 mg | ORAL_TABLET | Freq: Two times a day (BID) | ORAL | 1 refills | Status: AC

## 2024-10-31 ENCOUNTER — Encounter (HOSPITAL_COMMUNITY)

## 2024-10-31 ENCOUNTER — Encounter (HOSPITAL_COMMUNITY): Payer: Self-pay

## 2024-11-01 ENCOUNTER — Telehealth: Payer: Self-pay | Admitting: Family Medicine

## 2024-11-01 ENCOUNTER — Other Ambulatory Visit (INDEPENDENT_AMBULATORY_CARE_PROVIDER_SITE_OTHER)

## 2024-11-01 DIAGNOSIS — Z111 Encounter for screening for respiratory tuberculosis: Secondary | ICD-10-CM

## 2024-11-01 NOTE — Telephone Encounter (Signed)
 Type of form received: Adult Care Home  Additional comments:   Received by: self  Form should be Faxed/mailed to: (address/ fax #)  Is patient requesting call for pickup: [phone] or [phone]   Form placed:  provider bin  Attach charge sheet.  Provider will determine charge.  Individual made aware of 3-5 business day turn around Yes?

## 2024-11-01 NOTE — Telephone Encounter (Signed)
 Placed in providers folder at nurse station for review

## 2024-11-05 ENCOUNTER — Encounter (HOSPITAL_COMMUNITY)

## 2024-11-05 ENCOUNTER — Ambulatory Visit: Payer: Self-pay | Admitting: Family Medicine

## 2024-11-05 ENCOUNTER — Encounter (HOSPITAL_COMMUNITY): Payer: Self-pay

## 2024-11-05 LAB — QUANTIFERON-TB GOLD PLUS
Mitogen-NIL: 9.4 [IU]/mL
NIL: 0.03 [IU]/mL
QuantiFERON-TB Gold Plus: NEGATIVE
TB1-NIL: 0 [IU]/mL
TB2-NIL: 0 [IU]/mL

## 2024-11-06 ENCOUNTER — Other Ambulatory Visit: Payer: Self-pay

## 2024-11-06 ENCOUNTER — Telehealth: Payer: Self-pay | Admitting: Family Medicine

## 2024-11-06 NOTE — Telephone Encounter (Signed)
Placed in providers folder for review.

## 2024-11-06 NOTE — Telephone Encounter (Signed)
 Sent results to [facility] at [phone] via epic fax

## 2024-11-06 NOTE — Telephone Encounter (Signed)
 Type of form received: Assisted living- [facility]  Additional comments:   Received by: Fax  Form should be Faxed/mailed to: (address/ fax #) [phone]  Is patient requesting call for pickup:  Form placed:  Provider bin  Attach charge sheet.  Provider will determine charge.  Individual made aware of 3-5 business day turn around Yes?

## 2024-11-07 ENCOUNTER — Encounter (HOSPITAL_COMMUNITY): Payer: Self-pay

## 2024-11-08 ENCOUNTER — Ambulatory Visit: Admitting: Family Medicine

## 2024-11-08 ENCOUNTER — Other Ambulatory Visit: Payer: Self-pay

## 2024-11-08 ENCOUNTER — Ambulatory Visit: Payer: Self-pay | Admitting: Family Medicine

## 2024-11-08 ENCOUNTER — Encounter: Payer: Self-pay | Admitting: Family Medicine

## 2024-11-08 ENCOUNTER — Encounter (INDEPENDENT_AMBULATORY_CARE_PROVIDER_SITE_OTHER): Admitting: Ophthalmology

## 2024-11-08 ENCOUNTER — Ambulatory Visit

## 2024-11-08 VITALS — BP 118/68 | HR 60 | Ht 69.0 in | Wt 198.4 lb

## 2024-11-08 DIAGNOSIS — H35033 Hypertensive retinopathy, bilateral: Secondary | ICD-10-CM

## 2024-11-08 DIAGNOSIS — I10 Essential (primary) hypertension: Secondary | ICD-10-CM

## 2024-11-08 DIAGNOSIS — H43813 Vitreous degeneration, bilateral: Secondary | ICD-10-CM

## 2024-11-08 DIAGNOSIS — H353221 Exudative age-related macular degeneration, left eye, with active choroidal neovascularization: Secondary | ICD-10-CM

## 2024-11-08 DIAGNOSIS — H353112 Nonexudative age-related macular degeneration, right eye, intermediate dry stage: Secondary | ICD-10-CM

## 2024-11-08 DIAGNOSIS — E119 Type 2 diabetes mellitus without complications: Secondary | ICD-10-CM | POA: Diagnosis not present

## 2024-11-08 DIAGNOSIS — Z022 Encounter for examination for admission to residential institution: Secondary | ICD-10-CM

## 2024-11-08 DIAGNOSIS — Z7984 Long term (current) use of oral hypoglycemic drugs: Secondary | ICD-10-CM

## 2024-11-08 LAB — CBC WITH DIFFERENTIAL/PLATELET
Basophils Absolute: 0.1 10*3/uL (ref 0.0–0.1)
Basophils Relative: 0.9 % (ref 0.0–3.0)
Eosinophils Absolute: 0.3 10*3/uL (ref 0.0–0.7)
Eosinophils Relative: 5.2 % — ABNORMAL HIGH (ref 0.0–5.0)
HCT: 35.4 % — ABNORMAL LOW (ref 39.0–52.0)
Hemoglobin: 11.8 g/dL — ABNORMAL LOW (ref 13.0–17.0)
Lymphocytes Relative: 29.9 % (ref 12.0–46.0)
Lymphs Abs: 2 10*3/uL (ref 0.7–4.0)
MCHC: 33.2 g/dL (ref 30.0–36.0)
MCV: 92.8 fl (ref 78.0–100.0)
Monocytes Absolute: 0.7 10*3/uL (ref 0.1–1.0)
Monocytes Relative: 9.8 % (ref 3.0–12.0)
Neutro Abs: 3.7 10*3/uL (ref 1.4–7.7)
Neutrophils Relative %: 54.2 % (ref 43.0–77.0)
Platelets: 134 10*3/uL — ABNORMAL LOW (ref 150.0–400.0)
RBC: 3.81 Mil/uL — ABNORMAL LOW (ref 4.22–5.81)
RDW: 16.3 % — ABNORMAL HIGH (ref 11.5–15.5)
WBC: 6.7 10*3/uL (ref 4.0–10.5)

## 2024-11-08 LAB — LIPID PANEL
Cholesterol: 100 mg/dL (ref 28–200)
HDL: 27.8 mg/dL — ABNORMAL LOW
LDL Cholesterol: 49 mg/dL (ref 10–99)
NonHDL: 71.83
Total CHOL/HDL Ratio: 4
Triglycerides: 112 mg/dL (ref 10.0–149.0)
VLDL: 22.4 mg/dL (ref 0.0–40.0)

## 2024-11-08 LAB — BASIC METABOLIC PANEL WITH GFR
BUN: 15 mg/dL (ref 6–23)
CO2: 30 meq/L (ref 19–32)
Calcium: 9 mg/dL (ref 8.4–10.5)
Chloride: 101 meq/L (ref 96–112)
Creatinine, Ser: 0.92 mg/dL (ref 0.40–1.50)
GFR: 74.05 mL/min
Glucose, Bld: 110 mg/dL — ABNORMAL HIGH (ref 70–99)
Potassium: 4.7 meq/L (ref 3.5–5.1)
Sodium: 136 meq/L (ref 135–145)

## 2024-11-08 LAB — HEPATIC FUNCTION PANEL
ALT: 14 U/L (ref 3–53)
AST: 20 U/L (ref 5–37)
Albumin: 3.7 g/dL (ref 3.5–5.2)
Alkaline Phosphatase: 72 U/L (ref 39–117)
Bilirubin, Direct: 0.3 mg/dL (ref 0.1–0.3)
Total Bilirubin: 0.9 mg/dL (ref 0.2–1.2)
Total Protein: 6.4 g/dL (ref 6.0–8.3)

## 2024-11-08 LAB — HEMOGLOBIN A1C: Hgb A1c MFr Bld: 6.5 % (ref 4.6–6.5)

## 2024-11-08 LAB — TSH: TSH: 1.4 u[IU]/mL (ref 0.35–5.50)

## 2024-11-08 NOTE — Progress Notes (Signed)
" ° °  Subjective:    Patient ID: Marvin Foster, male    DOB: [birth year], 89 y.o.   MRN: [id number]  HPI FL2- currently living w/ son and daughter (alternating) and plan is for assisted living.  Needs med list reviewed, forms completed.   Review of Systems For ROS see HPI     Objective:   Physical Exam Vitals reviewed.  Constitutional:      General: He is not in acute distress.    Appearance: Normal appearance.  HENT:     Head: Normocephalic and atraumatic.  Eyes:     Extraocular Movements: Extraocular movements intact.     Conjunctiva/sclera: Conjunctivae normal.  Cardiovascular:     Rate and Rhythm: Normal rate. Rhythm irregular.  Pulmonary:     Effort: Pulmonary effort is normal. No respiratory distress.  Skin:    General: Skin is warm and dry.  Neurological:     Mental Status: He is alert. Mental status is at baseline.  Psychiatric:        Mood and Affect: Mood normal.        Behavior: Behavior normal.        Thought Content: Thought content normal.           Assessment & Plan:  Encounter for nursing home admission- new.  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  Unfortunately, he has not been the same since his surgery- confusion, hallucinations.  At this time, the family has decided that an ALF is the best way to proceed.  Has already had TB screen.  Will update all labs today.  FL2 form completed.  "

## 2024-11-08 NOTE — Patient Instructions (Addendum)
 Follow up as scheduled in April We'll notify you of your lab results and make any changes if needed Keep up the good work!  You look great! Call with any questions or concerns I'm PROUD of you!!

## 2024-11-09 ENCOUNTER — Ambulatory Visit: Attending: Cardiology | Admitting: *Deleted

## 2024-11-09 DIAGNOSIS — I4821 Permanent atrial fibrillation: Secondary | ICD-10-CM | POA: Diagnosis not present

## 2024-11-09 DIAGNOSIS — Z952 Presence of prosthetic heart valve: Secondary | ICD-10-CM | POA: Diagnosis not present

## 2024-11-09 DIAGNOSIS — Z7901 Long term (current) use of anticoagulants: Secondary | ICD-10-CM

## 2024-11-09 DIAGNOSIS — I4891 Unspecified atrial fibrillation: Secondary | ICD-10-CM

## 2024-11-09 DIAGNOSIS — Z5181 Encounter for therapeutic drug level monitoring: Secondary | ICD-10-CM

## 2024-11-09 LAB — POCT INR: INR: 2.4 (ref 2.0–3.0)

## 2024-11-09 NOTE — Patient Instructions (Signed)
 Description   *DOES NOT NEED A PRINTOUT*  INR-2.4; Continue taking 1 tablet (2.5mg ) daily, except 1/2 tablet (1.25mg ) every Monday and Friday. INR in 4 weeks  Call us  with any medication changes or concerns # [phone] Coumadin  Clinic.   Allopurinol  Daily started 1/3

## 2024-11-09 NOTE — Progress Notes (Signed)
 Description   *DOES NOT NEED A PRINTOUT*  INR-2.4; Continue taking 1 tablet (2.5mg ) daily, except 1/2 tablet (1.25mg ) every Monday and Friday. INR in 4 weeks  Call us  with any medication changes or concerns # [phone] Coumadin  Clinic.   Allopurinol  Daily started 1/3

## 2024-11-12 ENCOUNTER — Encounter (HOSPITAL_COMMUNITY): Payer: Self-pay

## 2024-11-12 ENCOUNTER — Encounter (INDEPENDENT_AMBULATORY_CARE_PROVIDER_SITE_OTHER): Admitting: Ophthalmology

## 2024-11-13 ENCOUNTER — Encounter: Payer: Self-pay | Admitting: Family Medicine

## 2024-11-14 ENCOUNTER — Encounter (HOSPITAL_COMMUNITY): Payer: Self-pay

## 2024-11-14 NOTE — Telephone Encounter (Signed)
Paper work has been faxed and scanned.

## 2024-11-14 NOTE — Telephone Encounter (Signed)
 Forms completed and returned to [facility]

## 2024-11-15 ENCOUNTER — Telehealth: Payer: Self-pay | Admitting: *Deleted

## 2024-11-15 NOTE — CV Procedure (Signed)
" °  Device system confirmed to be MRI conditional, with implant date > 6 weeks ago, and no evidence of abandoned or epicardial leads in review of most recent CXR  Device last cleared by EP Provider: Prentice Passey 11/15/2024  Clearance is good through for 1 year as long as parameters remain stable at time of check. If pt undergoes a cardiac device procedure during that time, they should be re-cleared.   Tachy-therapies to be programmed off if applicable with device back to pre-MRI settings after completion of exam.  [employer] was available remotely to assist in programming recommendations.   Rocky Catalan, RT  11/15/2024 11:36 AM     "

## 2024-11-15 NOTE — Telephone Encounter (Addendum)
 Pt's daughter, Verneita Molt,  called and [facility] with a request to call her back. Phone # [phone]  Called and spoke to Verneita who stated that pt is moving to [facility] assisted living on Tuesday 2/10. Verneita stated that [facility] needs warfarin instructions faxed to the nurse [facility]  # [phone]  Attempted to call [facility] at [phone], [facility].

## 2024-11-16 ENCOUNTER — Encounter

## 2024-11-19 ENCOUNTER — Encounter (HOSPITAL_COMMUNITY): Payer: Self-pay

## 2024-11-20 ENCOUNTER — Ambulatory Visit: Payer: Self-pay | Admitting: Cardiology

## 2024-11-20 ENCOUNTER — Ambulatory Visit (HOSPITAL_COMMUNITY): Admission: RE | Admit: 2024-11-20 | Source: Ambulatory Visit

## 2024-11-21 ENCOUNTER — Encounter (HOSPITAL_COMMUNITY): Payer: Self-pay

## 2024-11-26 ENCOUNTER — Encounter (HOSPITAL_COMMUNITY): Payer: Self-pay

## 2024-12-06 ENCOUNTER — Ambulatory Visit

## 2024-12-06 ENCOUNTER — Ambulatory Visit: Admitting: Physician Assistant

## 2024-12-19 ENCOUNTER — Encounter: Payer: Self-pay | Admitting: Physician Assistant

## 2025-01-02 ENCOUNTER — Ambulatory Visit: Payer: Self-pay | Admitting: Student

## 2025-01-10 ENCOUNTER — Encounter (INDEPENDENT_AMBULATORY_CARE_PROVIDER_SITE_OTHER): Admitting: Ophthalmology

## 2025-01-23 ENCOUNTER — Ambulatory Visit: Admitting: Family Medicine

## 2025-02-15 ENCOUNTER — Encounter

## 2025-05-17 ENCOUNTER — Encounter

## 2025-06-25 ENCOUNTER — Encounter

## 2025-07-08 ENCOUNTER — Ambulatory Visit: Payer: Self-pay | Admitting: Physician Assistant

## 2025-07-08 ENCOUNTER — Ambulatory Visit (HOSPITAL_COMMUNITY)

## 2025-08-16 ENCOUNTER — Encounter

## 2025-11-15 ENCOUNTER — Encounter
